# Patient Record
Sex: Male | Born: 1935 | Race: Black or African American | Hispanic: No | Marital: Married | Smoking: Former smoker
Health system: Southern US, Community
[De-identification: ages and names within clinical notes are randomized; demographics above are authoritative.]

## PROBLEM LIST (undated history)

## (undated) DIAGNOSIS — C801 Malignant (primary) neoplasm, unspecified: Secondary | ICD-10-CM

## (undated) DIAGNOSIS — M8580 Other specified disorders of bone density and structure, unspecified site: Secondary | ICD-10-CM

## (undated) DIAGNOSIS — E785 Hyperlipidemia, unspecified: Secondary | ICD-10-CM

## (undated) DIAGNOSIS — I2510 Atherosclerotic heart disease of native coronary artery without angina pectoris: Secondary | ICD-10-CM

## (undated) DIAGNOSIS — N200 Calculus of kidney: Secondary | ICD-10-CM

## (undated) DIAGNOSIS — Z923 Personal history of irradiation: Secondary | ICD-10-CM

## (undated) DIAGNOSIS — IMO0002 Reserved for concepts with insufficient information to code with codable children: Secondary | ICD-10-CM

## (undated) DIAGNOSIS — I639 Cerebral infarction, unspecified: Secondary | ICD-10-CM

## (undated) DIAGNOSIS — K5790 Diverticulosis of intestine, part unspecified, without perforation or abscess without bleeding: Secondary | ICD-10-CM

## (undated) DIAGNOSIS — R4702 Dysphasia: Secondary | ICD-10-CM

## (undated) DIAGNOSIS — N400 Enlarged prostate without lower urinary tract symptoms: Secondary | ICD-10-CM

## (undated) DIAGNOSIS — I82409 Acute embolism and thrombosis of unspecified deep veins of unspecified lower extremity: Secondary | ICD-10-CM

## (undated) DIAGNOSIS — I10 Essential (primary) hypertension: Secondary | ICD-10-CM

## (undated) DIAGNOSIS — Z9221 Personal history of antineoplastic chemotherapy: Secondary | ICD-10-CM

## (undated) DIAGNOSIS — K219 Gastro-esophageal reflux disease without esophagitis: Secondary | ICD-10-CM

## (undated) HISTORY — DX: Personal history of irradiation: Z92.3

## (undated) HISTORY — DX: Cerebral infarction, unspecified: I63.9

## (undated) HISTORY — PX: NO PAST SURGERIES: SHX2092

## (undated) HISTORY — DX: Other specified disorders of bone density and structure, unspecified site: M85.80

## (undated) HISTORY — DX: Reserved for concepts with insufficient information to code with codable children: IMO0002

## (undated) HISTORY — DX: Benign prostatic hyperplasia without lower urinary tract symptoms: N40.0

## (undated) HISTORY — DX: Essential (primary) hypertension: I10

## (undated) HISTORY — DX: Hyperlipidemia, unspecified: E78.5

## (undated) HISTORY — DX: Gastro-esophageal reflux disease without esophagitis: K21.9

## (undated) HISTORY — DX: Atherosclerotic heart disease of native coronary artery without angina pectoris: I25.10

## (undated) HISTORY — DX: Personal history of antineoplastic chemotherapy: Z92.21

---

## 1998-04-21 ENCOUNTER — Emergency Department (HOSPITAL_COMMUNITY): Admission: EM | Admit: 1998-04-21 | Discharge: 1998-04-21 | Payer: Self-pay | Admitting: Emergency Medicine

## 1999-03-11 ENCOUNTER — Ambulatory Visit (HOSPITAL_COMMUNITY): Admission: RE | Admit: 1999-03-11 | Discharge: 1999-03-11 | Payer: Self-pay | Admitting: Internal Medicine

## 1999-03-11 ENCOUNTER — Encounter: Payer: Self-pay | Admitting: Internal Medicine

## 1999-05-10 ENCOUNTER — Inpatient Hospital Stay (HOSPITAL_COMMUNITY): Admission: EM | Admit: 1999-05-10 | Discharge: 1999-05-15 | Payer: Self-pay | Admitting: Internal Medicine

## 1999-05-12 ENCOUNTER — Encounter: Payer: Self-pay | Admitting: Gastroenterology

## 1999-05-13 ENCOUNTER — Encounter: Payer: Self-pay | Admitting: Internal Medicine

## 2000-12-05 ENCOUNTER — Encounter: Payer: Self-pay | Admitting: Emergency Medicine

## 2000-12-05 ENCOUNTER — Emergency Department (HOSPITAL_COMMUNITY): Admission: EM | Admit: 2000-12-05 | Discharge: 2000-12-05 | Payer: Self-pay | Admitting: Emergency Medicine

## 2001-05-03 ENCOUNTER — Other Ambulatory Visit: Admission: RE | Admit: 2001-05-03 | Discharge: 2001-05-03 | Payer: Self-pay | Admitting: *Deleted

## 2002-05-22 ENCOUNTER — Inpatient Hospital Stay (HOSPITAL_COMMUNITY): Admission: EM | Admit: 2002-05-22 | Discharge: 2002-05-22 | Payer: Self-pay | Admitting: *Deleted

## 2003-02-06 ENCOUNTER — Encounter: Admission: RE | Admit: 2003-02-06 | Discharge: 2003-02-06 | Payer: Self-pay | Admitting: Internal Medicine

## 2003-02-06 ENCOUNTER — Encounter: Payer: Self-pay | Admitting: Internal Medicine

## 2003-04-01 ENCOUNTER — Encounter: Payer: Self-pay | Admitting: Internal Medicine

## 2003-04-01 ENCOUNTER — Encounter: Admission: RE | Admit: 2003-04-01 | Discharge: 2003-04-01 | Payer: Self-pay | Admitting: Internal Medicine

## 2003-04-02 ENCOUNTER — Encounter: Payer: Self-pay | Admitting: Internal Medicine

## 2003-04-02 ENCOUNTER — Encounter: Admission: RE | Admit: 2003-04-02 | Discharge: 2003-04-02 | Payer: Self-pay | Admitting: Internal Medicine

## 2003-06-04 ENCOUNTER — Encounter: Payer: Self-pay | Admitting: Internal Medicine

## 2003-06-04 ENCOUNTER — Encounter: Admission: RE | Admit: 2003-06-04 | Discharge: 2003-09-02 | Payer: Self-pay | Admitting: Internal Medicine

## 2003-06-04 ENCOUNTER — Encounter: Admission: RE | Admit: 2003-06-04 | Discharge: 2003-06-04 | Payer: Self-pay | Admitting: Internal Medicine

## 2003-11-11 ENCOUNTER — Emergency Department (HOSPITAL_COMMUNITY): Admission: EM | Admit: 2003-11-11 | Discharge: 2003-11-11 | Payer: Self-pay | Admitting: Emergency Medicine

## 2003-12-20 DIAGNOSIS — M8580 Other specified disorders of bone density and structure, unspecified site: Secondary | ICD-10-CM

## 2003-12-20 DIAGNOSIS — G459 Transient cerebral ischemic attack, unspecified: Secondary | ICD-10-CM

## 2003-12-20 DIAGNOSIS — I639 Cerebral infarction, unspecified: Secondary | ICD-10-CM

## 2003-12-20 DIAGNOSIS — I2510 Atherosclerotic heart disease of native coronary artery without angina pectoris: Secondary | ICD-10-CM

## 2003-12-20 HISTORY — DX: Atherosclerotic heart disease of native coronary artery without angina pectoris: I25.10

## 2003-12-20 HISTORY — DX: Cerebral infarction, unspecified: I63.9

## 2003-12-20 HISTORY — DX: Transient cerebral ischemic attack, unspecified: G45.9

## 2003-12-20 HISTORY — DX: Other specified disorders of bone density and structure, unspecified site: M85.80

## 2004-03-08 ENCOUNTER — Emergency Department (HOSPITAL_COMMUNITY): Admission: EM | Admit: 2004-03-08 | Discharge: 2004-03-08 | Payer: Self-pay | Admitting: Emergency Medicine

## 2005-01-15 ENCOUNTER — Emergency Department (HOSPITAL_COMMUNITY): Admission: EM | Admit: 2005-01-15 | Discharge: 2005-01-15 | Payer: Self-pay | Admitting: Family Medicine

## 2005-01-15 ENCOUNTER — Ambulatory Visit (HOSPITAL_COMMUNITY): Admission: RE | Admit: 2005-01-15 | Discharge: 2005-01-15 | Payer: Self-pay | Admitting: Family Medicine

## 2005-01-19 ENCOUNTER — Observation Stay (HOSPITAL_COMMUNITY): Admission: EM | Admit: 2005-01-19 | Discharge: 2005-01-22 | Payer: Self-pay | Admitting: Emergency Medicine

## 2005-02-17 ENCOUNTER — Ambulatory Visit: Payer: Self-pay | Admitting: Cardiology

## 2005-03-07 ENCOUNTER — Encounter (HOSPITAL_COMMUNITY): Admission: RE | Admit: 2005-03-07 | Discharge: 2005-06-05 | Payer: Self-pay | Admitting: Cardiovascular Disease

## 2005-04-21 ENCOUNTER — Ambulatory Visit: Payer: Self-pay | Admitting: Internal Medicine

## 2005-04-25 ENCOUNTER — Ambulatory Visit (HOSPITAL_COMMUNITY): Admission: RE | Admit: 2005-04-25 | Discharge: 2005-04-25 | Payer: Self-pay | Admitting: General Surgery

## 2005-06-06 ENCOUNTER — Encounter (HOSPITAL_COMMUNITY): Admission: RE | Admit: 2005-06-06 | Discharge: 2005-09-04 | Payer: Self-pay | Admitting: Cardiovascular Disease

## 2005-07-07 ENCOUNTER — Ambulatory Visit: Payer: Self-pay | Admitting: Cardiology

## 2005-10-05 ENCOUNTER — Ambulatory Visit: Payer: Self-pay | Admitting: Cardiology

## 2006-01-03 ENCOUNTER — Ambulatory Visit: Payer: Self-pay | Admitting: *Deleted

## 2006-01-19 ENCOUNTER — Encounter: Admission: RE | Admit: 2006-01-19 | Discharge: 2006-01-19 | Payer: Self-pay | Admitting: Internal Medicine

## 2006-04-12 ENCOUNTER — Ambulatory Visit: Payer: Self-pay | Admitting: *Deleted

## 2007-03-12 ENCOUNTER — Emergency Department (HOSPITAL_COMMUNITY): Admission: EM | Admit: 2007-03-12 | Discharge: 2007-03-12 | Payer: Self-pay | Admitting: Family Medicine

## 2008-12-20 ENCOUNTER — Emergency Department (HOSPITAL_COMMUNITY): Admission: EM | Admit: 2008-12-20 | Discharge: 2008-12-20 | Payer: Self-pay | Admitting: Emergency Medicine

## 2010-11-08 ENCOUNTER — Encounter: Payer: Self-pay | Admitting: Internal Medicine

## 2010-11-12 ENCOUNTER — Encounter
Admission: RE | Admit: 2010-11-12 | Discharge: 2010-11-12 | Payer: Self-pay | Source: Home / Self Care | Admitting: Internal Medicine

## 2010-11-12 DIAGNOSIS — C801 Malignant (primary) neoplasm, unspecified: Secondary | ICD-10-CM

## 2010-11-12 HISTORY — DX: Malignant (primary) neoplasm, unspecified: C80.1

## 2010-11-17 ENCOUNTER — Encounter: Payer: Self-pay | Admitting: Internal Medicine

## 2010-12-08 ENCOUNTER — Ambulatory Visit: Payer: Self-pay | Admitting: Internal Medicine

## 2010-12-08 DIAGNOSIS — R0789 Other chest pain: Secondary | ICD-10-CM | POA: Insufficient documentation

## 2010-12-08 DIAGNOSIS — J90 Pleural effusion, not elsewhere classified: Secondary | ICD-10-CM | POA: Insufficient documentation

## 2010-12-08 DIAGNOSIS — J984 Other disorders of lung: Secondary | ICD-10-CM

## 2010-12-14 ENCOUNTER — Encounter: Payer: Self-pay | Admitting: Internal Medicine

## 2010-12-14 ENCOUNTER — Ambulatory Visit
Admission: RE | Admit: 2010-12-14 | Discharge: 2010-12-14 | Payer: Self-pay | Source: Home / Self Care | Attending: Internal Medicine | Admitting: Internal Medicine

## 2010-12-14 ENCOUNTER — Ambulatory Visit (HOSPITAL_COMMUNITY)
Admission: RE | Admit: 2010-12-14 | Discharge: 2010-12-14 | Payer: Self-pay | Source: Home / Self Care | Attending: Internal Medicine | Admitting: Internal Medicine

## 2010-12-29 ENCOUNTER — Telehealth: Payer: Self-pay | Admitting: Internal Medicine

## 2011-01-03 ENCOUNTER — Telehealth (INDEPENDENT_AMBULATORY_CARE_PROVIDER_SITE_OTHER): Payer: Self-pay | Admitting: *Deleted

## 2011-01-04 ENCOUNTER — Ambulatory Visit (HOSPITAL_COMMUNITY)
Admission: RE | Admit: 2011-01-04 | Discharge: 2011-01-04 | Payer: Self-pay | Source: Home / Self Care | Attending: Internal Medicine | Admitting: Internal Medicine

## 2011-01-05 LAB — AMYLASE: Amylase: 69 U/L (ref 0–105)

## 2011-01-05 LAB — CBC
HCT: 41.5 % (ref 39.0–52.0)
Hemoglobin: 13.2 g/dL (ref 13.0–17.0)
MCH: 28.8 pg (ref 26.0–34.0)
MCHC: 31.8 g/dL (ref 30.0–36.0)
MCV: 90.6 fL (ref 78.0–100.0)
Platelets: 273 10*3/uL (ref 150–400)
RBC: 4.58 MIL/uL (ref 4.22–5.81)
RDW: 14.1 % (ref 11.5–15.5)
WBC: 7.1 10*3/uL (ref 4.0–10.5)

## 2011-01-05 LAB — LACTATE DEHYDROGENASE, PLEURAL OR PERITONEAL FLUID: LD, Fluid: 187 U/L — ABNORMAL HIGH (ref 3–23)

## 2011-01-05 LAB — DIFFERENTIAL
Basophils Absolute: 0 10*3/uL (ref 0.0–0.1)
Basophils Relative: 1 % (ref 0–1)
Eosinophils Absolute: 0.2 10*3/uL (ref 0.0–0.7)
Eosinophils Relative: 3 % (ref 0–5)
Lymphocytes Relative: 16 % (ref 12–46)
Lymphs Abs: 1.2 10*3/uL (ref 0.7–4.0)
Monocytes Absolute: 0.7 10*3/uL (ref 0.1–1.0)
Monocytes Relative: 10 % (ref 3–12)
Neutro Abs: 5 10*3/uL (ref 1.7–7.7)
Neutrophils Relative %: 71 % (ref 43–77)

## 2011-01-05 LAB — ALBUMIN, FLUID (OTHER): Albumin, Fluid: 2.6 g/dL

## 2011-01-05 LAB — PROTEIN, TOTAL: Total Protein: 6.6 g/dL (ref 6.0–8.3)

## 2011-01-05 LAB — ALBUMIN: Albumin: 3.4 g/dL — ABNORMAL LOW (ref 3.5–5.2)

## 2011-01-05 LAB — BODY FLUID CELL COUNT WITH DIFFERENTIAL
Lymphs, Fluid: 70 %
Monocyte-Macrophage-Serous Fluid: 10 % — ABNORMAL LOW (ref 50–90)
Neutrophil Count, Fluid: 20 % (ref 0–25)
Total Nucleated Cell Count, Fluid: 10140 cu mm — ABNORMAL HIGH (ref 0–1000)

## 2011-01-05 LAB — PATHOLOGIST SMEAR REVIEW

## 2011-01-05 LAB — GLUCOSE, RANDOM: Glucose, Bld: 250 mg/dL — ABNORMAL HIGH (ref 70–99)

## 2011-01-05 LAB — AMYLASE, BODY FLUID: Amylase, Fluid: 43 U/L

## 2011-01-05 LAB — LACTATE DEHYDROGENASE: LDH: 134 U/L (ref 94–250)

## 2011-01-10 ENCOUNTER — Telehealth: Payer: Self-pay | Admitting: Internal Medicine

## 2011-01-11 LAB — BODY FLUID CULTURE: Culture: NO GROWTH

## 2011-01-12 ENCOUNTER — Ambulatory Visit
Admission: RE | Admit: 2011-01-12 | Discharge: 2011-01-12 | Payer: Self-pay | Source: Home / Self Care | Attending: Internal Medicine | Admitting: Internal Medicine

## 2011-01-12 DIAGNOSIS — C3490 Malignant neoplasm of unspecified part of unspecified bronchus or lung: Secondary | ICD-10-CM | POA: Insufficient documentation

## 2011-01-14 ENCOUNTER — Telehealth: Payer: Self-pay | Admitting: Internal Medicine

## 2011-01-14 ENCOUNTER — Ambulatory Visit: Payer: Self-pay | Admitting: Internal Medicine

## 2011-01-19 ENCOUNTER — Telehealth (INDEPENDENT_AMBULATORY_CARE_PROVIDER_SITE_OTHER): Payer: Self-pay | Admitting: *Deleted

## 2011-01-20 NOTE — Letter (Signed)
Summary: [facility]  [facility]   Imported By: Sherian Rein 12/15/2010 15:43:57  _____________________________________________________________________  External Attachment:    Type:   Image     Comment:   External Document

## 2011-01-20 NOTE — Progress Notes (Signed)
Summary: needs fu  Phone Note Outgoing Call   Summary of Call: Caren Griffins know if you got my other flag. Please give him fu ASAP to discuss results of thoracentesis. IT is cancer. I would like to give him diagnosis face to face. Please have him bring family member or friend for discussion if he feels the need.  Initial call taken by: Kalman Shan MD,  January 10, 2011 12:57 AM     Appended Document: needs fu pt set to see MR on 01-12-11 at 1:30.

## 2011-01-20 NOTE — Progress Notes (Signed)
Summary: patient returning a call  Phone Note Call from Patient   Caller: Patient Call For: dr Marchelle Gearing Summary of Call: Patient phoned stated that his wife called him and told him that a nurse called hime this morning but that she did not leave a message. Patient can be reached at [phone] Initial call taken by: Vedia Coffer,  January 14, 2011 9:35 AM  Follow-up for Phone Call        Pt aware that referral was sent to [facility] and that he needs f/u in 2-3- months. reminder placed. Carron Curie CMA  January 14, 2011 3:40 PM

## 2011-01-20 NOTE — Progress Notes (Signed)
Summary: returning a phone call  Phone Note Call from Patient   Caller: Patient Call For: Dr. Marchelle Gearing Summary of Call: Patient phoned stated that his wife just phoned and informed him that our office phoned her and told her that he had an appointment tomorrow for a procedure to get fluid off of his lungs. patient did not know who called him but would like a call back. he can be reached at [phone] or [phone] Initial call taken by: Vedia Coffer,  January 03, 2011 11:58 AM  Follow-up for Phone Call        Spoke with pt.  He wanted to make sure that it was correct that thoracentesis tommorrow.  I advised that yes, he is sched at [facility] for tommorrow 01/04/11 at 2 pm.  Pt verbalized understanding. Follow-up by: Vernie Murders,  January 03, 2011 2:22 PM

## 2011-01-20 NOTE — Progress Notes (Addendum)
Summary: test results-  Phone Note Call from Patient   Caller: Patient Call For: Dr. Marchelle Gearing Summary of Call: patient phoned he had some test at [facility] and he would like to know if we have the results. Patient can be reached at [phone]  Initial call taken by: Vedia Coffer,  December 29, 2010 2:30 PM  Follow-up for Phone Call        Spoke with patient-he had CT and PET scan on 12-14-10 and would like to know the results today asap. They are signed off on in EMR but results noted. Please advise.Reynaldo Minium CMA  December 29, 2010 2:47 PM   Additional Follow-up for Phone Call Additional follow up Details #1::        please tell him I will call him tomorrow with results. Just got back from vacation and catching up. Please apologize Additional Follow-up by: Kalman Shan MD,  December 29, 2010 5:32 PM    Additional Follow-up for Phone Call Additional follow up Details #2::    called spoke with patient, advised of MR's response.  pt okay with this and verbalized his understanding.  will forward to MR as a reminder. Boone Master CNA/MA  December 29, 2010 5:34 PM   Pt called again today re: same and will wait for callback from MR. Tivis Ringer, CNA  December 30, 2010 2:09 PM  Daughter lomalee called again, wants test results.Darletta Moll  December 31, 2010 11:02 AM   Additional Follow-up for Phone Call Additional follow up Details #3:: Details for Additional Follow-up Action Taken: Spoke to patient  a) LUL nodule - PET low grade uptake. Wil d/w [facility] on thrusday next week to decide fu v biopsy b) Left pleural effusion - still with pain. Will set up US guided thoracentesis  HJe will wait to hear more from [place] Additional Follow-up by: Kalman Shan MD,  December 31, 2010 6:46 PM  [place] scheduled and Almyra Free spoke to pt wife. Carron Curie CMA  January 03, 2011 3:34 PM  Appended Document: test results- call from IR. They say that fluid is moderate in size and they can draw  mpore than whatis neeed for diagnostic. I approved to remove as much as fluid as possible. Will await results and decide fu  Appended Document: test results- pleural fluid shows malignant cells. Pls call him and give him appt 01/12/2011 tuesday to discuss. He does NOT know diagnosis. Please do not give over phone  Appended Document: test results- pt set to see MR on 01-02-11 at 1:30pm.

## 2011-01-20 NOTE — Letter (Signed)
Summary: Referral,problem & medication list/[facility]  Referral,problem & medication list/[facility]   Imported By: Sherian Rein 12/15/2010 15:46:24  _____________________________________________________________________  External Attachment:    Type:   Image     Comment:   External Document

## 2011-01-20 NOTE — Assessment & Plan Note (Signed)
Summary: LUNG NODULES///KP   Visit Type:  Initial Consult Copy to:  Dr. Fleet Contras Primary Provider/Referring Provider:  Dr. Fleet Contras  CC:  Pulmonary Consult for pain in left side of chest under left arm. Pt states pain is worse when he lays on left side. 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 He is a ex-96 pack smoker.  He c/o left infrascapular pain that is of insisious onset starting 3 months ago per outside records. it is constant, made worse by bending to left or lying down on left side, and imprved by lying down on right and partially by Alleve. Compressing in nature. Moderately to Severe in intensity. Occassionally radiates to left infraaxillary area. Denies assoicated fever, weight loss, hemoptysis, local trauma, angina, cough, wheezing. However, some 3 months ago he recollects pullng a heavy gate on a daily basis with left arm. He was doing this for atleast 1 month and stopped when current pain started. He denies any recent pneumonia, chills, fever.    Preventive Screening-Counseling & Management  Alcohol-Tobacco     Alcohol drinks/day: 0     Smoking Status: quit     Packs/Day: 3.0     Year Started: 1946     Year Quit: 1980     Pack years: 77  Current Medications (verified): 1)  Meclizine Hcl 25 Mg Tabs (Meclizine Hcl) .Marland Kitchen.. 1 Tablet Every 8 Hours As Needed 2)  Glucovance 5-500 Mg Tabs (Glyburide-Metformin) .... Take 1 Tablet By Mouth Two Times A Day \\par  3)  Crestor 40 Mg Tabs (Rosuvastatin Calcium) .... Take 1 Tablet By Mouth Once A Day 4)  Lotrel 5-10 Mg Caps (Amlodipine Besy-Benazepril Hcl) .... Take 1 Tablet By Mouth Once A Day 5)  Nexium 40 Mg Cpdr (Esomeprazole Magnesium) .... Take 1 Tablet By Mouth Once A Day 6)  Aspirin 81 Mg Tbec (Aspirin) .... Take 1 Tablet By Mouth Once A Day 7)  Flonase 50 Mcg/act Susp (Fluticasone Propionate) .... 2 Sprays Each Nostril Daily 8)  Allegra Allergy 180 Mg Tabs  (Fexofenadine Hcl) .... Take 1 Tablet By Mouth Once A Day As Needed 9)  Flomax 0.4 Mg Caps (Tamsulosin Hcl) .... Take 1 Tablet By Mouth Once A Day 10)  Centrum Silver  Tabs (Multiple Vitamins-Minerals) .... Take 1 Tablet By Mouth Once A Day  Allergies (verified): 1)  ! Pcn  Past History:  Past Medical History: Gout Deg. of lumbar intervertebral Disc BPH Diabetes Hypertension Hyperlipidemia GERD Osteopenia CAD ........Marland KitchenDr Erlene Quan  - NSTEMI 2006, 100% RCA. 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  Started in 1948, quit in 1980 3 ppd.  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  The patient denies shortness of breath with activity, shortness of breath at rest, productive cough, non-productive cough, coughing up blood, irregular heartbeats, acid heartburn, indigestion, loss of appetite, weight change, abdominal pain, difficulty swallowing, sore throat, tooth/dental problems, headaches, nasal congestion/difficulty breathing through nose, sneezing, itching, ear ache, anxiety, depression, hand/feet swelling, joint stiffness or pain, rash, change in color of mucus, and fever.    Vital Signs:  Patient profile:   77 year old male  Height:      65 inches Weight:      196.25 pounds BMI:     32.78 O2 Sat:      95 % on Room air Temp:     97.5 degrees F oral Pulse rate:   100 / minute BP sitting:   160 / 98  (right arm) Cuff size:   regular  Vitals Entered By: Carron Curie CMA (December 08, 2010 9:32 AM)  O2 Flow:  Room air CC: Pulmonary Consult for pain in left side of chest under left arm. Pt states pain is worse when he lays on left side.  Comments Medications reviewed with  patient Carron Curie CMA  December 08, 2010 9:35 AM Daytime phone number verified with patient.    Physical Exam  General:  well developed, well nourished, in no acute distressobese.   Head:  normocephalic and atraumatic Eyes:  PERRLA/EOM intact; conjunctiva and sclera clear Ears:  TMs intact and clear with normal canals Nose:  no deformity, discharge, inflammation, or lesions Mouth:  no deformity or lesions Neck:  no masses, thyromegaly, or abnormal cervical nodes Chest Wall:  no deformities noted Lungs:  decreased BS on L and dullness L base.  rest normal. some pain on deep palpation Heart:  regular rate and rhythm, S1, S2 without murmurs, rubs, gallops, or clicks Abdomen:  bowel sounds positive; abdomen soft and non-tender without masses, or organomegaly Msk:  no deformity or scoliosis noted with normal posture Pulses:  pulses normal Extremities:  no clubbing, cyanosis, edema, or deformity noted Neurologic:  CN II-XII grossly intact with normal reflexes, coordination, muscle strength and tone Skin:  intact without lesions or rashes Cervical Nodes:  no significant adenopathy Axillary Nodes:  no significant adenopathy Psych:  alert and cooperative; normal mood and affect; normal attention span and concentration   CT of Chest  Procedure date:  11/12/2010  Findings:      Small left loculated effusion LUL arnterior segment 1.7 cm atypical nodule on image 23 bullous emphysema esp at apices  Comments:      all new since prior ct chest imaging in 2006.   I independently reviewed films  Impression & Recommendations:  Problem # 1:  CHEST PAIN, ATYPICAL (ICD-786.59) Assessment New This is due to the loculated left effusion. Only partial relief with alleve.   plan for pain contrl I have upgraded him to percocet generic as needed need to finish workup first likely might needs VATS exam with thoracic surgery Orders: Pulmonary Referral (Pulmonary) Consultation Level V  (04540) Prescription Created Electronically [phone])  Problem # 2:  PLEURAL EFFUSION, LEFT (ICD-511.9) Assessment: New Unclear why he has loculated effusion. No prior pna history. One possibility is trauma and smal hemothorax from pullnig a heavy gate.Another possibiluty is malignancy. DDx also inclues empyema with pna that was astymptomatic preceding it.  PLAN repeat ct Orders: Radiology Referral (Radiology) Pulmonary Referral (Pulmonary) Consultation Level V [phone])  Problem # 3:  PULMONARY NODULE, LEFT UPPER LOBE (ICD-518.89) Assessment: New Unclear if nodule is incidental finding or related to left loculated effusion. Regardlesss it is new since 2006, and with age and smoking history and size of 1.7cm need to proceeed with lung cancer workup. Will get PET scan with SUPER D PROTOCOL. IF nodule is amenable to ENB bronch, he will need that.  Orders: Radiology Referral (Radiology) Pulmonary Referral (Pulmonary) Consultation Level V [phone])  Medications Added to Medication List This Visit: 1)  Centrum Silver Tabs (Multiple vitamins-minerals) .... Take 1 tablet  by mouth once a day 2)  Oxycodone-acetaminophen 5-500 Mg Caps (Oxycodone-acetaminophen) .... Take 1 tablet every 6 hours as needed  Patient Instructions: 1)  please have PET scan 2)  please have breathing test called PFT 3)  for pain take percocet as directed 4)  return after completion of tests in early part of Jan 2012 Prescriptions: OXYCODONE-ACETAMINOPHEN 5-500 MG CAPS (OXYCODONE-ACETAMINOPHEN) take 1 tablet every 6 hours as needed  #120 x 0   Entered and Authorized by:   Kalman Shan MD   Signed by:   Kalman Shan MD on 12/08/2010   Method used:   Print then Give to Patient   RxID:   [id number]    Immunization History:  Influenza Immunization History:    Influenza:  historical (10/19/2010)

## 2011-01-20 NOTE — Assessment & Plan Note (Signed)
Summary: discuss results//jrc   Visit Type:  Follow-up Copy to:  Dr. Fleet Contras Primary Provider/Referring Provider:  Dr. Fleet Contras  CC:  Pt here to discuss results. .  History of Present Illness: December 08, 2010: 77 year old Johnathan Bowman referred by Dr. Claudie Revering. He is a ex-96 pack smoker.  He c/o left infrascapular pain that is of insisious onset starting 3 months ago per outside records. it is constant, made worse by bending to left or lying down on left side, and imprved by lying down on right and partially by Alleve. Compressing in nature. Moderately to Severe in intensity. Occassionally radiates to left infraaxillary area. Denies assoicated fever, weight loss, hemoptysis, local trauma, angina, cough, wheezing. However, some 3 months ago he recollects pullng a heavy gate on a daily basis with left arm. He was doing this for atleast 1 month and stopped when current pain started. He denies any recent pneumonia, chills, fever. REC PET and CT   January 12, 2011: PET and CT showed low uptake pulmonary nodule but persistent left pleural effusion. Underwent left side thoracenteis 01/04/2011 that  is POSITIVE FOR METASTATIC ADENOCARCINOMA suspicious for lung cancer source. No interim complaints. Less dyspneic but stil with rib cage pain on left side. No edema. No weight loss.     Preventive Screening-Counseling & Management  Alcohol-Tobacco     Alcohol drinks/day: 0     Smoking Status: quit     Packs/Day: 3.0     Year Started: 1946     Year Quit: 1980     Pack years: 55  Current Medications (verified): 1)  Meclizine Hcl 25 Mg Tabs (Meclizine Hcl) .Marland Kitchen.. 1 Tablet Every 8 Hours As Needed 2)  Glucovance 5-500 Mg Tabs (Glyburide-Metformin) .... Take 1 Tablet By Mouth Two Times A Day \\par  3)  Crestor 40 Mg Tabs (Rosuvastatin Calcium) .... Take 1 Tablet By Mouth Once A Day 4)  Nexium 40 Mg Cpdr (Esomeprazole Magnesium) .... Take 1 Tablet By Mouth Once A Day 5)  Aspirin 81 Mg Tbec  (Aspirin) .... Take 1 Tablet By Mouth Once A Day 6)  Flonase 50 Mcg/act Susp (Fluticasone Propionate) .... 2 Sprays Each Nostril Daily 7)  Allegra Allergy 180 Mg Tabs (Fexofenadine Hcl) .... Take 1 Tablet By Mouth Once A Day As Needed 8)  Flomax 0.4 Mg Caps (Tamsulosin Hcl) .... Take 1 Tablet By Mouth Once A Day 9)  Centrum Silver  Tabs (Multiple Vitamins-Minerals) .... Take 1 Tablet By Mouth Once A Day 10)  Oxycodone-Acetaminophen 5-500 Mg Caps (Oxycodone-Acetaminophen) .... As Directed  Allergies (verified): 1)  ! Pcn  Past History:  Past medical, surgical, family and social histories (including risk factors) reviewed, and no changes noted (except as noted below).  Past Medical History: Reviewed history from 12/08/2010 and no changes required. Gout Deg. of lumbar intervertebral Disc BPH Diabetes Hypertension Hyperlipidemia GERD Osteopenia CAD ........Marland KitchenDr Erlene Quan  - NSTEMI 2006, 100% RCA. Medically Rx CVA Allergic Rhinitis  Family History: Reviewed history from 12/07/2010 and no changes required. Father-died MVA Mother-died unknown Siblings-none  Social History: Reviewed history from 12/08/2010 and no changes required. One son and one daughter, living and well  - son in [place]. 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  Started in 1948, quit in 1980 3 ppd.  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  The patient denies non-productive cough, coughing up blood, irregular heartbeats, acid heartburn, indigestion, loss of appetite, weight change, abdominal pain, difficulty swallowing, sore throat, tooth/dental problems, headaches, nasal congestion/difficulty breathing through nose, sneezing, itching, ear ache,  anxiety, depression, hand/feet swelling, joint stiffness or pain, rash, change in color of mucus, and fever.    Vital Signs:  Patient profile:   77 year old male Height:      65 inches Weight:      195.38 pounds BMI:     32.63 O2 Sat:      91 % on Room air Temp:     97.0 degrees F oral Pulse rate:   98 / minute BP sitting:   130 / 92  (right arm) Cuff size:   regular  Vitals Entered By: Carron Curie CMA (January 12, 2011 1:33 PM)  O2 Flow:  Room air CC: Pt here to discuss results.  Comments Medications reviewed with patient Carron Curie CMA  January 12, 2011 1:36 PM Daytime phone number verified with patient.    Physical Exam  General:  well developed, well nourished, in no acute distressobese.   Head:  normocephalic and atraumatic Eyes:  PERRLA/EOM intact; conjunctiva and sclera clear Ears:  TMs intact and clear with normal canals Nose:  no deformity, discharge, inflammation, or lesions Mouth:  no deformity or lesions Neck:  no masses, thyromegaly, or abnormal cervical nodes Chest Wall:  no deformities noted Lungs:  decreased BS on L and dullness L base.  rest normal. some pain on deep palpation Heart:  regular rate and rhythm, S1, S2 without murmurs, rubs, gallops, or clicks Abdomen:  bowel sounds positive; abdomen soft and non-tender without masses, or organomegaly Msk:  no deformity or scoliosis noted with normal posture Pulses:  pulses normal Extremities:  no clubbing, cyanosis, edema, or deformity noted Neurologic:  CN II-XII grossly intact with normal reflexes, coordination, muscle strength and tone Skin:  intact without lesions or rashes Cervical Nodes:  no significant adenopathy Axillary Nodes:  no significant adenopathy Psych:  alert and cooperative; normal mood and affect; normal attention span and concentration   MISC. Report  Procedure date:  01/04/2011  Findings:      nderwent thoracenteis 01/04/2011 that  is POSITIVE FOR METASTATIC  ADENOCARCINOMA  Impression & Recommendations:  Problem # 1:  ADENOCARCIMONA, LUNG, NONSMALL CELL (ICD-162.9) Assessment New likely stage 4 NSCLC. Broke news. He will inform wife. ECOG is 1  plan await EFGR and ALK stains refer DR. Mohammed will dicus at [facility] tomorrow about pleurodesis and other optiosns  Medications Added to Medication List This Visit: 1)  Oxycodone-acetaminophen 5-500 Mg Caps (Oxycodone-acetaminophen) .... As directed  Other Orders: Oncology Referral (Oncology) Est. Patient Level IV [phone])  Patient Instructions: 1)  I am going to discuss your case at cancer conference tomorrow 2)  AS discussed your diagnosis is lung cancer 3)  I have set up appointment with Dr. Roland Rack cancer specialist to see you 4)  Please wait to hear from me in next 2 days about my meeting tomorrow  Appended Document: discuss results//jrc let him  know to see Dr. Shirline Frees in oncology and then to see me back in 2-3 months to make sure everything is going okay and how that fluid is doing. pls give him fu  Appended Document: discuss results//jrc [id number] with family member.   Appended Document: discuss results//jrc  see phone note.

## 2011-01-24 ENCOUNTER — Telehealth: Payer: Self-pay | Admitting: Internal Medicine

## 2011-01-28 ENCOUNTER — Emergency Department (HOSPITAL_COMMUNITY): Payer: Medicare Other

## 2011-01-28 ENCOUNTER — Emergency Department (HOSPITAL_COMMUNITY)
Admission: EM | Admit: 2011-01-28 | Discharge: 2011-01-28 | Disposition: A | Discharge status: Home / Self Care | Payer: Medicare Other | Attending: Emergency Medicine | Admitting: Emergency Medicine

## 2011-01-28 DIAGNOSIS — R059 Cough, unspecified: Secondary | ICD-10-CM | POA: Insufficient documentation

## 2011-01-28 DIAGNOSIS — J90 Pleural effusion, not elsewhere classified: Secondary | ICD-10-CM | POA: Insufficient documentation

## 2011-01-28 DIAGNOSIS — R079 Chest pain, unspecified: Secondary | ICD-10-CM | POA: Insufficient documentation

## 2011-01-28 DIAGNOSIS — M129 Arthropathy, unspecified: Secondary | ICD-10-CM | POA: Insufficient documentation

## 2011-01-28 DIAGNOSIS — I252 Old myocardial infarction: Secondary | ICD-10-CM | POA: Insufficient documentation

## 2011-01-28 DIAGNOSIS — C801 Malignant (primary) neoplasm, unspecified: Secondary | ICD-10-CM | POA: Insufficient documentation

## 2011-01-28 DIAGNOSIS — E785 Hyperlipidemia, unspecified: Secondary | ICD-10-CM | POA: Insufficient documentation

## 2011-01-28 DIAGNOSIS — C3490 Malignant neoplasm of unspecified part of unspecified bronchus or lung: Secondary | ICD-10-CM | POA: Insufficient documentation

## 2011-01-28 DIAGNOSIS — E119 Type 2 diabetes mellitus without complications: Secondary | ICD-10-CM | POA: Insufficient documentation

## 2011-01-28 DIAGNOSIS — Z79899 Other long term (current) drug therapy: Secondary | ICD-10-CM | POA: Insufficient documentation

## 2011-01-28 DIAGNOSIS — I2510 Atherosclerotic heart disease of native coronary artery without angina pectoris: Secondary | ICD-10-CM | POA: Insufficient documentation

## 2011-01-28 DIAGNOSIS — I509 Heart failure, unspecified: Secondary | ICD-10-CM | POA: Insufficient documentation

## 2011-01-28 DIAGNOSIS — R0602 Shortness of breath: Secondary | ICD-10-CM | POA: Insufficient documentation

## 2011-01-28 DIAGNOSIS — Z85118 Personal history of other malignant neoplasm of bronchus and lung: Secondary | ICD-10-CM | POA: Insufficient documentation

## 2011-01-28 DIAGNOSIS — R0902 Hypoxemia: Secondary | ICD-10-CM | POA: Insufficient documentation

## 2011-01-28 DIAGNOSIS — R05 Cough: Secondary | ICD-10-CM | POA: Insufficient documentation

## 2011-01-28 DIAGNOSIS — R0601 Orthopnea: Secondary | ICD-10-CM | POA: Insufficient documentation

## 2011-01-28 LAB — COMPREHENSIVE METABOLIC PANEL
Albumin: 3.1 g/dL — ABNORMAL LOW (ref 3.5–5.2)
Alkaline Phosphatase: 84 U/L (ref 39–117)
CO2: 28 mEq/L (ref 19–32)
Calcium: 8.8 mg/dL (ref 8.4–10.5)
Chloride: 100 mEq/L (ref 96–112)
Creatinine, Ser: 0.81 mg/dL (ref 0.4–1.5)
Potassium: 4.6 mEq/L (ref 3.5–5.1)
Sodium: 135 mEq/L (ref 135–145)
Total Bilirubin: 0.5 mg/dL (ref 0.3–1.2)
Total Protein: 7 g/dL (ref 6.0–8.3)

## 2011-01-28 LAB — DIFFERENTIAL
Basophils Absolute: 0 10*3/uL (ref 0.0–0.1)
Basophils Relative: 0 % (ref 0–1)
Eosinophils Absolute: 0.1 10*3/uL (ref 0.0–0.7)
Eosinophils Relative: 2 % (ref 0–5)
Monocytes Absolute: 0.7 10*3/uL (ref 0.1–1.0)
Monocytes Relative: 11 % (ref 3–12)

## 2011-01-28 LAB — CBC
HCT: 41.1 % (ref 39.0–52.0)
MCH: 28.1 pg (ref 26.0–34.0)
MCV: 88.8 fL (ref 78.0–100.0)
Platelets: 394 10*3/uL (ref 150–400)
RBC: 4.63 MIL/uL (ref 4.22–5.81)

## 2011-01-28 LAB — POCT CARDIAC MARKERS: Troponin i, poc: <0.05 ng/mL (ref 0.00–0.09)

## 2011-02-01 ENCOUNTER — Other Ambulatory Visit: Payer: Self-pay | Admitting: Internal Medicine

## 2011-02-01 ENCOUNTER — Encounter (HOSPITAL_BASED_OUTPATIENT_CLINIC_OR_DEPARTMENT_OTHER): Payer: Medicare Other | Admitting: Internal Medicine

## 2011-02-01 DIAGNOSIS — C7931 Secondary malignant neoplasm of brain: Secondary | ICD-10-CM

## 2011-02-01 DIAGNOSIS — C3490 Malignant neoplasm of unspecified part of unspecified bronchus or lung: Secondary | ICD-10-CM

## 2011-02-01 LAB — COMPREHENSIVE METABOLIC PANEL
ALT: 22 U/L (ref 0–53)
AST: 16 U/L (ref 0–37)
BUN: 10 mg/dL (ref 6–23)
CO2: 27 mEq/L (ref 19–32)
Calcium: 9 mg/dL (ref 8.4–10.5)
Potassium: 4.4 mEq/L (ref 3.5–5.3)
Sodium: 137 mEq/L (ref 135–145)
Total Bilirubin: 0.2 mg/dL — ABNORMAL LOW (ref 0.3–1.2)

## 2011-02-01 LAB — CBC WITH DIFFERENTIAL/PLATELET
EOS%: 4 % (ref 0.0–7.0)
HCT: 41.8 % (ref 38.4–49.9)
HGB: 13.2 g/dL (ref 13.0–17.1)
MCH: 27.8 pg (ref 27.2–33.4)
MCV: 87.8 fL (ref 79.3–98.0)
MONO%: 6.3 % (ref 0.0–14.0)
NEUT%: 79.1 % — ABNORMAL HIGH (ref 39.0–75.0)
Platelets: 411 10*3/uL — ABNORMAL HIGH (ref 140–400)
RDW: 14.4 % (ref 11.0–14.6)
WBC: 5.5 10*3/uL (ref 4.0–10.3)

## 2011-02-02 ENCOUNTER — Encounter (INDEPENDENT_AMBULATORY_CARE_PROVIDER_SITE_OTHER): Payer: Medicare Other | Admitting: Thoracic Surgery

## 2011-02-02 DIAGNOSIS — J910 Malignant pleural effusion: Secondary | ICD-10-CM

## 2011-02-03 ENCOUNTER — Telehealth: Payer: Self-pay | Admitting: Internal Medicine

## 2011-02-03 NOTE — Progress Notes (Signed)
Summary: referral  Phone Note Call from Patient   Caller: Patient Call For: ramaswamy Summary of Call: pt is still waiting for referal from dr. Shirline Frees. call pt at cell [phone] (try that # 1st) or home # above or [phone] Initial call taken by: Tivis Ringer, CNA,  January 19, 2011 3:40 PM  Follow-up for Phone Call        Looks like referal info was sent to the [facility] by Almyra Free on 01/14/11- called and LMOVM for Renne at [facility] to make sure records were recieved.  Follow-up by: Vernie Murders,  January 19, 2011 4:46 PM  Additional Follow-up for Phone Call Additional follow up Details #1::        Spoke with [facility] and they did nto receive referral . I will foward to [facility] so they an redo referral. Carron Curie CMA  January 24, 2011 11:30 AM     Additional Follow-up for Phone Call Additional follow up Details #2::    pt has appt set up and has been notified for the appt with [facility] and records were refaxed  Follow-up by: Oneita Jolly,  January 24, 2011 12:48 PM

## 2011-02-03 NOTE — Letter (Signed)
February 02, 2011  Velora Heckler. Arbutus Ped, MD [address] [place], [place] [address]  Re:  Johnathan Bowman, Johnathan Bowman                DOB:  12-28-1928  Dear Arbutus Ped:  I appreciate the opportunity to see this 77 year old African American male who developed left pleural effusion, has had multiple thoracentesis and thoracentesis show malignant adenocarcinoma, last one being done on 2/10.  He is not that short of breath.  Right now, he is on home oxygen. CT scan showed a 1.8 x 1.4 lesion in the left upper lobe and  also has some numerous pleural based nodules.  PET scan was positive in the nodules, but only low activity.  There also was minimal uptake in the pleural effusion.  He also had extensive colonic diverticulosis and bilateral inguinal hernias as well as a small renal calculus in the right pole collecting system.  PAST MEDICAL HISTORY:  He is on glucosamine and oxygen.  Another medication which he did not bring with him today.  He is allergic to PENICILLIN.  He has diabetes and hypertension.  FAMILY HISTORY:  Noncontributory.  SOCIAL HISTORY:  Married, three children.  Smoked for 30 years.  Does not smoke now, does not drink alcohol on a regular basis.  REVIEW OF SYSTEMS:  He is 193 pounds, he is 5 feet 6 inches.  GENERAL: His weight, he has had some weight loss.  CARDIAC:  No shortness of breath.  No angina.  PULMONARY:  He is on oxygen.  No hemoptysis.  GI: No nausea, vomiting, constipation, or diarrhea.  GU:  No kidney stone or kidney disease.  VASCULAR:  No claudication, DVT, or TIAs. NEUROLOGICAL:  No dizziness, headaches, blackouts, or seizures. MUSCULOSKELETAL:  Arthritis.  PSYCHIATRIC:  Depression and nervousness. Eyes/ENT:  No changes in eyesight or hearing.  HEMATOLOGIC:  No problems with bleeding or clotting disorders.  PHYSICAL EXAMINATION:  GENERAL:  He is a well-developed [place] American male in no acute distress. VITAL SIGNS:  His blood pressure is 157/90, pulse 100,  respirations 18, and sats were 98%. HEAD, EYES, EARS, NOSE AND THROAT:  Unremarkable. NECK:  Supple without thyromegaly.  There is no supraclavicular or axillary adenopathy. CHEST:  Decreased breath sounds on the left normal with sounds on the right. HEART:  Regular sinus rhythm.  No murmurs. ABDOMEN:  Soft.  There is no hepatosplenomegaly. EXTREMITIES:  Pulses are 2+.  There is no clubbing or edema. NEUROLOGIC:  He is oriented x3.  Sensory and motor intact.  Cranial nerves intact.  PLAN:  Insert a left Pleurx Catheter for him and start him on drainage and then also put a Port-A-Cath, we plan to do this on 22 at [facility].  Ines Bloomer, M.D. Electronically Signed  DPB/MEDQ  D:  02/02/2011  T:  02/02/2011  Job:  [id number]

## 2011-02-03 NOTE — Progress Notes (Signed)
Summary: pt needs appt with Dr. Arbutus Ped  Phone Note Call from Patient   Caller: Patient Call For: Anneliese Leblond Summary of Call: pt requests to speak to MR re: consult (still waiting for this) w/ dr Gwenyth Bouillon. pt states his condition is worsening. call pt at [phone] Initial call taken by: Tivis Ringer, CNA,  January 24, 2011 11:32 AM  Follow-up for Phone Call        pt has still not heard from [facility] YN:WGNF this was sent on 1/25 by libby--  Philipp Deputy CMA  January 24, 2011 12:07 PM   see prev phone note.Olive Macnaughton has appt. Carron Curie CMA  January 25, 2011 5:42 PM

## 2011-02-04 ENCOUNTER — Other Ambulatory Visit (HOSPITAL_COMMUNITY): Payer: Medicare Other

## 2011-02-04 ENCOUNTER — Other Ambulatory Visit: Payer: Self-pay | Admitting: Internal Medicine

## 2011-02-04 ENCOUNTER — Ambulatory Visit (HOSPITAL_COMMUNITY)
Admission: RE | Admit: 2011-02-04 | Discharge: 2011-02-04 | Disposition: A | Discharge status: Home / Self Care | Payer: Medicare Other | Source: Ambulatory Visit | Attending: Internal Medicine | Admitting: Internal Medicine

## 2011-02-04 DIAGNOSIS — R51 Headache: Secondary | ICD-10-CM | POA: Insufficient documentation

## 2011-02-04 DIAGNOSIS — C3490 Malignant neoplasm of unspecified part of unspecified bronchus or lung: Secondary | ICD-10-CM | POA: Insufficient documentation

## 2011-02-04 DIAGNOSIS — R209 Unspecified disturbances of skin sensation: Secondary | ICD-10-CM | POA: Insufficient documentation

## 2011-02-04 DIAGNOSIS — C7931 Secondary malignant neoplasm of brain: Secondary | ICD-10-CM

## 2011-02-04 DIAGNOSIS — H539 Unspecified visual disturbance: Secondary | ICD-10-CM | POA: Insufficient documentation

## 2011-02-04 MED ORDER — GADOBENATE DIMEGLUMINE 529 MG/ML IV SOLN
18.0000 mL | Freq: Once | INTRAVENOUS | Status: AC | PRN
  Administered 2011-02-04: 18 mL via INTRAVENOUS

## 2011-02-07 ENCOUNTER — Encounter (HOSPITAL_COMMUNITY)
Admission: RE | Admit: 2011-02-07 | Discharge: 2011-02-07 | Disposition: A | Discharge status: Home / Self Care | Payer: Medicare Other | Source: Ambulatory Visit | Attending: Thoracic Surgery | Admitting: Thoracic Surgery

## 2011-02-07 ENCOUNTER — Other Ambulatory Visit: Payer: Self-pay | Admitting: Thoracic Surgery

## 2011-02-07 DIAGNOSIS — J910 Malignant pleural effusion: Secondary | ICD-10-CM

## 2011-02-07 DIAGNOSIS — Z01818 Encounter for other preprocedural examination: Secondary | ICD-10-CM | POA: Insufficient documentation

## 2011-02-07 DIAGNOSIS — Z01812 Encounter for preprocedural laboratory examination: Secondary | ICD-10-CM | POA: Insufficient documentation

## 2011-02-07 DIAGNOSIS — Z01810 Encounter for preprocedural cardiovascular examination: Secondary | ICD-10-CM | POA: Insufficient documentation

## 2011-02-07 LAB — COMPREHENSIVE METABOLIC PANEL
Alkaline Phosphatase: 76 U/L (ref 39–117)
Chloride: 101 mEq/L (ref 96–112)
GFR calc Af Amer: >60 mL/min (ref >60)
GFR calc non Af Amer: >60 mL/min (ref >60)
Glucose, Bld: 211 mg/dL — ABNORMAL HIGH (ref 70–99)
Sodium: 137 mEq/L (ref 135–145)
Total Bilirubin: 0.5 mg/dL (ref 0.3–1.2)

## 2011-02-07 LAB — CBC
Hemoglobin: 14.1 g/dL (ref 13.0–17.0)
MCH: 27.9 pg (ref 26.0–34.0)
Platelets: 339 10*3/uL (ref 150–400)
RDW: 14 % (ref 11.5–15.5)

## 2011-02-07 LAB — SURGICAL PCR SCREEN
MRSA, PCR: NEGATIVE
Staphylococcus aureus: NEGATIVE

## 2011-02-07 LAB — PROTIME-INR
INR: 0.9 (ref 0.00–1.49)
Prothrombin Time: 12.4 seconds (ref 11.6–15.2)

## 2011-02-07 LAB — APTT: aPTT: 29 seconds (ref 24–37)

## 2011-02-08 ENCOUNTER — Ambulatory Visit (HOSPITAL_COMMUNITY)
Admission: RE | Admit: 2011-02-08 | Discharge: 2011-02-08 | Disposition: A | Discharge status: Home / Self Care | Payer: Medicare Other | Source: Ambulatory Visit | Attending: Thoracic Surgery | Admitting: Thoracic Surgery

## 2011-02-08 ENCOUNTER — Other Ambulatory Visit: Payer: Self-pay | Admitting: Thoracic Surgery

## 2011-02-08 ENCOUNTER — Ambulatory Visit (HOSPITAL_COMMUNITY): Payer: Medicare Other

## 2011-02-08 DIAGNOSIS — Z01810 Encounter for preprocedural cardiovascular examination: Secondary | ICD-10-CM | POA: Insufficient documentation

## 2011-02-08 DIAGNOSIS — H409 Unspecified glaucoma: Secondary | ICD-10-CM | POA: Insufficient documentation

## 2011-02-08 DIAGNOSIS — Z01818 Encounter for other preprocedural examination: Secondary | ICD-10-CM | POA: Insufficient documentation

## 2011-02-08 DIAGNOSIS — J910 Malignant pleural effusion: Secondary | ICD-10-CM

## 2011-02-08 DIAGNOSIS — E119 Type 2 diabetes mellitus without complications: Secondary | ICD-10-CM | POA: Insufficient documentation

## 2011-02-08 DIAGNOSIS — Z9981 Dependence on supplemental oxygen: Secondary | ICD-10-CM | POA: Insufficient documentation

## 2011-02-08 DIAGNOSIS — Z8673 Personal history of transient ischemic attack (TIA), and cerebral infarction without residual deficits: Secondary | ICD-10-CM | POA: Insufficient documentation

## 2011-02-08 DIAGNOSIS — I2510 Atherosclerotic heart disease of native coronary artery without angina pectoris: Secondary | ICD-10-CM | POA: Insufficient documentation

## 2011-02-08 DIAGNOSIS — Z01812 Encounter for preprocedural laboratory examination: Secondary | ICD-10-CM | POA: Insufficient documentation

## 2011-02-08 DIAGNOSIS — I252 Old myocardial infarction: Secondary | ICD-10-CM | POA: Insufficient documentation

## 2011-02-08 DIAGNOSIS — Z87891 Personal history of nicotine dependence: Secondary | ICD-10-CM | POA: Insufficient documentation

## 2011-02-08 DIAGNOSIS — I10 Essential (primary) hypertension: Secondary | ICD-10-CM | POA: Insufficient documentation

## 2011-02-08 HISTORY — PX: OTHER SURGICAL HISTORY: SHX169

## 2011-02-08 LAB — GLUCOSE, CAPILLARY
Glucose-Capillary: 119 mg/dL — ABNORMAL HIGH (ref 70–99)
Glucose-Capillary: 103 mg/dL — ABNORMAL HIGH (ref 70–99)

## 2011-02-09 NOTE — Op Note (Signed)
Johnathan Bowman, Johnathan Bowman                ACCOUNT NO.:  [id number]  MEDICAL RECORD NO.:  [id number]           PATIENT TYPE:  O  LOCATION:  XRAY                         FACILITY:  [facility]  PHYSICIAN:  Ines Bloomer, M.D. DATE OF BIRTH:  10-21-1929  DATE OF PROCEDURE:  02/07/2011 DATE OF DISCHARGE:  02/07/2011                              OPERATIVE REPORT   PREOPERATIVE DIAGNOSIS:  Left malignant pleural effusion secondary to the non-small cell lung cancer.  POSTOPERATIVE DIAGNOSIS:  Left malignant pleural effusion secondary to the non-small cell lung cancer.  OPERATION PERFORMED:  Insertion of left Pleurx and right IJ Port-A-Cath under local anesthesia.  SURGEON:  Ines Bloomer, MD  ANESTHESIA:  Xylocaine 1% with IV sedation.  The patient was turned to the left lateral thoracotomy position, was prepped and draped in usual sterile manner.  Two areas were infiltrated with 1% Xylocaine at the seventh intercostal space, the anterior axillary line and the posterior axillary line.  At the posterior axillary line, we inserted an Angiocath and through that a guidewire into the right pleura.  Angiocath was removed.  A stab wound was made around the guidewire.  Another stab wound was made at the anterior axillary line, and a Pleurx catheter was tunneled from the anterior to the posterior stab wounds.  Over the guidewire, was passed dilators, and the last dilator will be having a peel-away sheath.  The dilator and the guidewire were removed and the Pleurx catheter passed through the peel- away sheath.  Peel-away sheath was removed and it confirmed to be in the pleura with fluoro.  We sutured in place with 3-0 nylon at the entrance and exit site and then drained 1500 mL of serous fluid.  Dry sterile dressing was applied.  The patient was then turned to the supine position and prepped and draped in usual sterile manner.  An area was infiltrated 1% Xylocaine above the clavicle and right IJ  puncture was performed.  A guidewire threaded under fluoro to the right atrium. Another area was infiltrated with 1% Xylocaine inferior to this and a transverse incision was made, and a pocket was dissected out down to the pectoral fascia.  A 9.6 preattached Bard Port-A-Cath was inserted and sutured in place with 0 silk and then the tubing tunnel from the pocket to the stab wound around the guidewire.  Under fluoro guidance, it was measured to go the right atrial SVC junction and cut appropriately. Over the guidewire, was passed a dilator with a peel-away sheath.  The dilator and the guidewire removed.  The tubing was passed through the peel-away sheath. The peel-away sheath was removed.  It was confirmed to be in the distal SVC with fluoro.  Wound was closed with 3-0 Vicryl and Dermabond for the skin.  It was flushed easily and withdrew easily.  The patient tolerated the procedure well and was returned to the recovery room in stable condition.     Ines Bloomer, M.D.     DPB/MEDQ  D:  02/08/2011  T:  02/09/2011  Job:  [id number]  cc:   Lajuana Matte, MD  Electronically  Signed by Jovita Gamma M.D. on 02/09/2011 09:15:37 AM

## 2011-02-10 ENCOUNTER — Other Ambulatory Visit (HOSPITAL_COMMUNITY): Payer: Medicare Other

## 2011-02-11 ENCOUNTER — Other Ambulatory Visit: Payer: Self-pay | Admitting: Internal Medicine

## 2011-02-11 ENCOUNTER — Ambulatory Visit (HOSPITAL_COMMUNITY)
Admission: RE | Admit: 2011-02-11 | Discharge: 2011-02-11 | Disposition: A | Discharge status: Home / Self Care | Payer: Medicare Other | Source: Ambulatory Visit | Attending: Internal Medicine | Admitting: Internal Medicine

## 2011-02-11 ENCOUNTER — Encounter (HOSPITAL_BASED_OUTPATIENT_CLINIC_OR_DEPARTMENT_OTHER): Payer: Medicare Other | Admitting: Internal Medicine

## 2011-02-11 DIAGNOSIS — R059 Cough, unspecified: Secondary | ICD-10-CM | POA: Insufficient documentation

## 2011-02-11 DIAGNOSIS — Z1389 Encounter for screening for other disorder: Secondary | ICD-10-CM

## 2011-02-11 DIAGNOSIS — R079 Chest pain, unspecified: Secondary | ICD-10-CM | POA: Insufficient documentation

## 2011-02-11 DIAGNOSIS — K7689 Other specified diseases of liver: Secondary | ICD-10-CM | POA: Insufficient documentation

## 2011-02-11 DIAGNOSIS — R0602 Shortness of breath: Secondary | ICD-10-CM | POA: Insufficient documentation

## 2011-02-11 DIAGNOSIS — C3490 Malignant neoplasm of unspecified part of unspecified bronchus or lung: Secondary | ICD-10-CM

## 2011-02-11 DIAGNOSIS — R05 Cough: Secondary | ICD-10-CM | POA: Insufficient documentation

## 2011-02-11 DIAGNOSIS — J984 Other disorders of lung: Secondary | ICD-10-CM | POA: Insufficient documentation

## 2011-02-11 DIAGNOSIS — J90 Pleural effusion, not elsewhere classified: Secondary | ICD-10-CM | POA: Insufficient documentation

## 2011-02-11 LAB — MAGNESIUM: Magnesium: 1.8 mg/dL (ref 1.5–2.5)

## 2011-02-11 MED ORDER — IOHEXOL 300 MG/ML  SOLN: 80.0000 mL | Freq: Once | INTRAMUSCULAR | Status: AC | PRN

## 2011-02-14 ENCOUNTER — Other Ambulatory Visit: Payer: Self-pay | Admitting: Internal Medicine

## 2011-02-14 ENCOUNTER — Encounter (HOSPITAL_BASED_OUTPATIENT_CLINIC_OR_DEPARTMENT_OTHER): Payer: Medicare Other | Admitting: Internal Medicine

## 2011-02-14 DIAGNOSIS — Z1389 Encounter for screening for other disorder: Secondary | ICD-10-CM

## 2011-02-14 DIAGNOSIS — C3490 Malignant neoplasm of unspecified part of unspecified bronchus or lung: Secondary | ICD-10-CM

## 2011-02-14 LAB — CBC WITH DIFFERENTIAL/PLATELET
Basophils Absolute: 0 10*3/uL (ref 0.0–0.1)
EOS%: 4.8 % (ref 0.0–7.0)
Eosinophils Absolute: 0.3 10*3/uL (ref 0.0–0.5)
HCT: 42.3 % (ref 38.4–49.9)
HGB: 13.6 g/dL (ref 13.0–17.1)
MCH: 27.8 pg (ref 27.2–33.4)
NEUT#: 4.5 10*3/uL (ref 1.5–6.5)
NEUT%: 74.8 % (ref 39.0–75.0)
lymph#: 0.8 10*3/uL — ABNORMAL LOW (ref 0.9–3.3)

## 2011-02-14 LAB — COMPREHENSIVE METABOLIC PANEL
Albumin: 3 g/dL — ABNORMAL LOW (ref 3.5–5.2)
Alkaline Phosphatase: 65 U/L (ref 39–117)
BUN: 7 mg/dL (ref 6–23)
CO2: 29 mEq/L (ref 19–32)
Calcium: 8.8 mg/dL (ref 8.4–10.5)
Chloride: 100 mEq/L (ref 96–112)
Glucose, Bld: 310 mg/dL — ABNORMAL HIGH (ref 70–99)
Potassium: 4.4 mEq/L (ref 3.5–5.3)
Sodium: 137 mEq/L (ref 135–145)
Total Protein: 6.3 g/dL (ref 6.0–8.3)

## 2011-02-14 LAB — LACTATE DEHYDROGENASE: LDH: 145 U/L (ref 94–250)

## 2011-02-14 LAB — MAGNESIUM: Magnesium: 1.8 mg/dL (ref 1.5–2.5)

## 2011-02-15 ENCOUNTER — Ambulatory Visit: Payer: Medicare Other | Admitting: Thoracic Surgery

## 2011-02-15 ENCOUNTER — Encounter (HOSPITAL_BASED_OUTPATIENT_CLINIC_OR_DEPARTMENT_OTHER): Payer: Medicare Other | Admitting: Internal Medicine

## 2011-02-15 ENCOUNTER — Other Ambulatory Visit: Payer: Self-pay | Admitting: Internal Medicine

## 2011-02-15 DIAGNOSIS — Z5112 Encounter for antineoplastic immunotherapy: Secondary | ICD-10-CM

## 2011-02-15 DIAGNOSIS — Z5111 Encounter for antineoplastic chemotherapy: Secondary | ICD-10-CM

## 2011-02-15 DIAGNOSIS — C3490 Malignant neoplasm of unspecified part of unspecified bronchus or lung: Secondary | ICD-10-CM

## 2011-02-15 LAB — RESEARCH LABS

## 2011-02-17 ENCOUNTER — Ambulatory Visit (INDEPENDENT_AMBULATORY_CARE_PROVIDER_SITE_OTHER): Payer: Self-pay | Admitting: Thoracic Surgery

## 2011-02-17 DIAGNOSIS — J910 Malignant pleural effusion: Secondary | ICD-10-CM

## 2011-02-18 NOTE — Letter (Signed)
February 17, 2011  Mohamed K. Arbutus Ped, MD [address] [place], [place] [address]  Re:  Johnathan Bowman, Johnathan Bowman                DOB:  [birth year]  Dear Arbutus Ped:  I saw the patient back today.  His blood pressure is 146/95, pulse 100, respirations 18, sats were 95%.  His PleurX is draining yesterday, questionable 200 mL removed.  His Port-A-Cath is in good position and works well.  I will plan to have him drained 2 times a week and we will see him back in 2 weeks with a chest x-ray.  He is breathing much better since he has had his PleurX inserted.  Ines Bloomer, M.D. Electronically Signed  DPB/MEDQ  D:  02/17/2011  T:  02/17/2011  Job:  [id number]

## 2011-02-24 ENCOUNTER — Encounter (HOSPITAL_BASED_OUTPATIENT_CLINIC_OR_DEPARTMENT_OTHER): Payer: Medicare Other | Admitting: Internal Medicine

## 2011-02-24 ENCOUNTER — Other Ambulatory Visit: Payer: Self-pay | Admitting: Internal Medicine

## 2011-02-24 DIAGNOSIS — C3490 Malignant neoplasm of unspecified part of unspecified bronchus or lung: Secondary | ICD-10-CM

## 2011-02-24 LAB — CBC WITH DIFFERENTIAL/PLATELET
Eosinophils Absolute: 0.2 10*3/uL (ref 0.0–0.5)
HCT: 41.9 % (ref 38.4–49.9)
HGB: 12.9 g/dL — ABNORMAL LOW (ref 13.0–17.1)
LYMPH%: 26.6 % (ref 14.0–49.0)
MONO#: 0.2 10*3/uL (ref 0.1–0.9)
NEUT#: 2.7 10*3/uL (ref 1.5–6.5)
NEUT%: 63.8 % (ref 39.0–75.0)
Platelets: 255 10*3/uL (ref 140–400)

## 2011-02-24 LAB — COMPREHENSIVE METABOLIC PANEL
CO2: 28 mEq/L (ref 19–32)
Creatinine, Ser: 0.92 mg/dL (ref 0.40–1.50)
Glucose, Bld: 191 mg/dL — ABNORMAL HIGH (ref 70–99)
Sodium: 137 mEq/L (ref 135–145)
Total Bilirubin: 0.3 mg/dL (ref 0.3–1.2)
Total Protein: 6.3 g/dL (ref 6.0–8.3)

## 2011-02-24 NOTE — Progress Notes (Signed)
Summary: fu appts ?  Phone Note Outgoing Call   Summary of Call: he went to ER 01/29/2011 for dysnea. When is his appt with Dr Shirline Frees and fu wiuth me ? Initial call taken by: Kalman Shan MD,  February 03, 2011 12:43 PM  Follow-up for Phone Call        Per last OV note pt was to follow-up with you in 2-3 months remionder has been placed for this. Pt saw Dr. Shirline Frees on 02-01-11 and has follow-up with him on 02-14-11. Requested OV note top be faxed. Carron Curie CMA  February 08, 2011 12:05 PM\par  Additional Follow-up for Phone Call Additional follow up Details #1::        [ok thanks. Wil sign off Additional Follow-up by: Kalman Shan MD,  February 13, 2011 10:20 PM

## 2011-02-28 LAB — GLUCOSE, CAPILLARY: Glucose-Capillary: 173 mg/dL — ABNORMAL HIGH (ref 70–99)

## 2011-03-02 ENCOUNTER — Other Ambulatory Visit: Payer: Self-pay | Admitting: Thoracic Surgery

## 2011-03-02 ENCOUNTER — Ambulatory Visit (INDEPENDENT_AMBULATORY_CARE_PROVIDER_SITE_OTHER): Payer: Self-pay | Admitting: Thoracic Surgery

## 2011-03-02 ENCOUNTER — Ambulatory Visit
Admission: RE | Admit: 2011-03-02 | Discharge: 2011-03-02 | Disposition: A | Discharge status: Home / Self Care | Payer: Medicare Other | Source: Ambulatory Visit | Attending: Thoracic Surgery | Admitting: Thoracic Surgery

## 2011-03-02 DIAGNOSIS — J90 Pleural effusion, not elsewhere classified: Secondary | ICD-10-CM

## 2011-03-02 DIAGNOSIS — J910 Malignant pleural effusion: Secondary | ICD-10-CM

## 2011-03-03 ENCOUNTER — Encounter (HOSPITAL_BASED_OUTPATIENT_CLINIC_OR_DEPARTMENT_OTHER): Payer: Medicare Other | Admitting: Internal Medicine

## 2011-03-03 ENCOUNTER — Other Ambulatory Visit: Payer: Self-pay | Admitting: Internal Medicine

## 2011-03-03 DIAGNOSIS — C3490 Malignant neoplasm of unspecified part of unspecified bronchus or lung: Secondary | ICD-10-CM

## 2011-03-03 LAB — COMPREHENSIVE METABOLIC PANEL
ALT: 19 U/L (ref 0–53)
Albumin: 3.5 g/dL (ref 3.5–5.2)
CO2: 22 mEq/L (ref 19–32)
Calcium: 8.7 mg/dL (ref 8.4–10.5)
Chloride: 105 mEq/L (ref 96–112)
Potassium: 4.4 mEq/L (ref 3.5–5.3)
Sodium: 138 mEq/L (ref 135–145)
Total Protein: 6.2 g/dL (ref 6.0–8.3)

## 2011-03-03 LAB — CBC WITH DIFFERENTIAL/PLATELET
BASO%: 3 % — ABNORMAL HIGH (ref 0.0–2.0)
HCT: 41.5 % (ref 38.4–49.9)
MCHC: 31.3 g/dL — ABNORMAL LOW (ref 32.0–36.0)
MONO#: 0.7 10*3/uL (ref 0.1–0.9)
NEUT%: 24.6 % — ABNORMAL LOW (ref 39.0–75.0)
RDW: 14.3 % (ref 11.0–14.6)
WBC: 2.7 10*3/uL — ABNORMAL LOW (ref 4.0–10.3)
lymph#: 1.2 10*3/uL (ref 0.9–3.3)

## 2011-03-03 NOTE — Assessment & Plan Note (Signed)
OFFICE VISIT  HARRIS, PENTON DOB:  Nov 14, 1929                                        March 02, 2011 CHART #:  [id number]  The patient came today.  He is draining only 100 mL.  His chest x-ray showed no evidence of effusion.  We drained him today and only got 100 mL.  I think we will go ahead and drain him on Friday and then on Monday, I will drain him and then put him in talc.  I appreciate the opportunity.  His blood pressure is 150/88, pulse 100, respirations 20, sats were 92%.  Ines Bloomer, M.D. Electronically Signed  DPB/MEDQ  D:  03/02/2011  T:  03/03/2011  Job:  [id number]

## 2011-03-07 ENCOUNTER — Ambulatory Visit (HOSPITAL_COMMUNITY)
Admission: RE | Admit: 2011-03-07 | Discharge: 2011-03-07 | Disposition: A | Discharge status: Home / Self Care | Payer: Medicare Other | Source: Ambulatory Visit | Attending: Thoracic Surgery | Admitting: Thoracic Surgery

## 2011-03-07 DIAGNOSIS — C801 Malignant (primary) neoplasm, unspecified: Secondary | ICD-10-CM | POA: Insufficient documentation

## 2011-03-07 DIAGNOSIS — J90 Pleural effusion, not elsewhere classified: Secondary | ICD-10-CM

## 2011-03-07 DIAGNOSIS — C3490 Malignant neoplasm of unspecified part of unspecified bronchus or lung: Secondary | ICD-10-CM

## 2011-03-07 DIAGNOSIS — R0602 Shortness of breath: Secondary | ICD-10-CM

## 2011-03-07 DIAGNOSIS — J910 Malignant pleural effusion: Secondary | ICD-10-CM | POA: Insufficient documentation

## 2011-03-08 NOTE — Op Note (Signed)
  Johnathan Bowman, Johnathan Bowman                ACCOUNT NO.:  [id number]  MEDICAL RECORD NO.:  [id number]           PATIENT TYPE:  O  LOCATION:  2855                         FACILITY:  [facility]  PHYSICIAN:  Ines Bloomer, M.D. DATE OF BIRTH:  December 21, 1928  DATE OF PROCEDURE:  03/07/2011 DATE OF DISCHARGE:                              OPERATIVE REPORT   PREOPERATIVE DIAGNOSIS:  Malignant left pleural effusion, status post Pleurx insertion.  POSTOPERATIVE DIAGNOSIS:  Malignant left pleural effusion, status post Pleurx insertion.  OPERATION PERFORMED:  Drainage of pleural effusion, removal of Pleurx, talc pleurodesis.  SURGEON:  Ines Bloomer, MD.  ANESTHESIA:  Xylocaine 1%.  After removing the bandage from the thorax, we then connected the connector to the Pleurx cuff and a stopcock and removed 100 mL of serous fluid.  We then inserted 2.5 to 3 g of talc slurry through the Pleurx catheter into the chest.  We cut the suture hold in the Pleurx catheter and then injected the exit site with 2% Xylocaine after then prepped. We then dissected up the cuff on the Pleurx catheter and removed the Pleurx catheter and sutured the exit site with one 3-0 nylon.  Sterile dressing applied.  The patient tolerated the procedure well.     Ines Bloomer, M.D.     DPB/MEDQ  D:  03/07/2011  T:  03/08/2011  Job:  [id number]  Electronically Signed by Jovita Gamma M.D. on 03/08/2011 05:44:36 PM

## 2011-03-10 ENCOUNTER — Other Ambulatory Visit: Payer: Self-pay | Admitting: Internal Medicine

## 2011-03-10 ENCOUNTER — Ambulatory Visit (HOSPITAL_COMMUNITY): Payer: Medicare Other | Attending: Internal Medicine

## 2011-03-10 ENCOUNTER — Encounter (HOSPITAL_BASED_OUTPATIENT_CLINIC_OR_DEPARTMENT_OTHER): Payer: Medicare Other | Admitting: Internal Medicine

## 2011-03-10 DIAGNOSIS — R05 Cough: Secondary | ICD-10-CM | POA: Insufficient documentation

## 2011-03-10 DIAGNOSIS — Z5112 Encounter for antineoplastic immunotherapy: Secondary | ICD-10-CM

## 2011-03-10 DIAGNOSIS — R0602 Shortness of breath: Secondary | ICD-10-CM | POA: Insufficient documentation

## 2011-03-10 DIAGNOSIS — C3490 Malignant neoplasm of unspecified part of unspecified bronchus or lung: Secondary | ICD-10-CM

## 2011-03-10 DIAGNOSIS — Z5111 Encounter for antineoplastic chemotherapy: Secondary | ICD-10-CM

## 2011-03-10 DIAGNOSIS — J90 Pleural effusion, not elsewhere classified: Secondary | ICD-10-CM | POA: Insufficient documentation

## 2011-03-10 DIAGNOSIS — R059 Cough, unspecified: Secondary | ICD-10-CM | POA: Insufficient documentation

## 2011-03-10 DIAGNOSIS — Z1389 Encounter for screening for other disorder: Secondary | ICD-10-CM

## 2011-03-10 LAB — CBC WITH DIFFERENTIAL/PLATELET
BASO%: 0.1 % (ref 0.0–2.0)
EOS%: 0.1 % (ref 0.0–7.0)
MCH: 27.7 pg (ref 27.2–33.4)
MCHC: 32.8 g/dL (ref 32.0–36.0)
MONO#: 1.1 10*3/uL — ABNORMAL HIGH (ref 0.1–0.9)
RBC: 4.48 10*6/uL (ref 4.20–5.82)
WBC: 11.9 10*3/uL — ABNORMAL HIGH (ref 4.0–10.3)
lymph#: 0.7 10*3/uL — ABNORMAL LOW (ref 0.9–3.3)

## 2011-03-10 LAB — COMPREHENSIVE METABOLIC PANEL
ALT: 16 U/L (ref 0–53)
AST: 19 U/L (ref 0–37)
Albumin: 2.7 g/dL — ABNORMAL LOW (ref 3.5–5.2)
Alkaline Phosphatase: 67 U/L (ref 39–117)
Calcium: 8.8 mg/dL (ref 8.4–10.5)
Chloride: 101 mEq/L (ref 96–112)
Creatinine, Ser: 0.94 mg/dL (ref 0.40–1.50)
Potassium: 4.1 mEq/L (ref 3.5–5.3)

## 2011-03-10 LAB — PROTEIN / CREATININE RATIO, URINE
Creatinine, Urine: 241.2 mg/dL
Total Protein, Urine: 184 mg/dL

## 2011-03-10 LAB — UA PROTEIN, DIPSTICK - CHCC: Protein, Urine: 100 mg/dL

## 2011-03-16 ENCOUNTER — Ambulatory Visit (INDEPENDENT_AMBULATORY_CARE_PROVIDER_SITE_OTHER): Payer: Self-pay | Admitting: Thoracic Surgery

## 2011-03-16 DIAGNOSIS — J910 Malignant pleural effusion: Secondary | ICD-10-CM

## 2011-03-17 ENCOUNTER — Other Ambulatory Visit: Payer: Self-pay | Admitting: Internal Medicine

## 2011-03-17 ENCOUNTER — Encounter (HOSPITAL_BASED_OUTPATIENT_CLINIC_OR_DEPARTMENT_OTHER): Payer: Medicare Other | Admitting: Internal Medicine

## 2011-03-17 DIAGNOSIS — Z5112 Encounter for antineoplastic immunotherapy: Secondary | ICD-10-CM

## 2011-03-17 DIAGNOSIS — C3490 Malignant neoplasm of unspecified part of unspecified bronchus or lung: Secondary | ICD-10-CM

## 2011-03-17 DIAGNOSIS — Z1389 Encounter for screening for other disorder: Secondary | ICD-10-CM

## 2011-03-17 LAB — CBC WITH DIFFERENTIAL/PLATELET
BASO%: 0.2 % (ref 0.0–2.0)
Eosinophils Absolute: 0.2 10*3/uL (ref 0.0–0.5)
LYMPH%: 48.7 % (ref 14.0–49.0)
MCHC: 32.8 g/dL (ref 32.0–36.0)
MONO#: 0 10*3/uL — ABNORMAL LOW (ref 0.1–0.9)
NEUT#: 0.8 10*3/uL — ABNORMAL LOW (ref 1.5–6.5)
Platelets: 161 10*3/uL (ref 140–400)
RBC: 4.41 10*6/uL (ref 4.20–5.82)
WBC: 2 10*3/uL — ABNORMAL LOW (ref 4.0–10.3)
lymph#: 1 10*3/uL (ref 0.9–3.3)

## 2011-03-17 LAB — COMPREHENSIVE METABOLIC PANEL
ALT: 17 U/L (ref 0–53)
CO2: 22 mEq/L (ref 19–32)
Sodium: 135 mEq/L (ref 135–145)
Total Bilirubin: 0.4 mg/dL (ref 0.3–1.2)
Total Protein: 6.4 g/dL (ref 6.0–8.3)

## 2011-03-17 LAB — UA PROTEIN, DIPSTICK - CHCC: Protein, Urine: 30 mg/dL

## 2011-03-17 NOTE — Letter (Signed)
March 16, 2011  Velora Heckler. Arbutus Ped, MD [address] [place], [place] [address]  Re:  Johnathan Bowman, Johnathan Bowman                DOB:  [birth year]  Dear Shirline Frees,  I saw the patient back today.  He is having a lot of arthritis and passing lot of gas that was his main complaint.  We removed his stitch from where he had his Pleurx in.  His x-ray on March 22, those read as increased effusion, but this is probably all reaction from the talc that we placed.  His blood pressure is 141/94, pulse 100, respirations 18, and sats were 95%.  I will plan to see him back again in 3 weeks with another chest x-ray.  Ines Bloomer, M.D. Electronically Signed  DPB/MEDQ  D:  03/16/2011  T:  03/17/2011  Job:  [id number]

## 2011-03-24 ENCOUNTER — Other Ambulatory Visit: Payer: Self-pay | Admitting: Internal Medicine

## 2011-03-24 ENCOUNTER — Encounter (HOSPITAL_BASED_OUTPATIENT_CLINIC_OR_DEPARTMENT_OTHER): Payer: Medicare Other | Admitting: Internal Medicine

## 2011-03-24 DIAGNOSIS — Z1389 Encounter for screening for other disorder: Secondary | ICD-10-CM

## 2011-03-24 DIAGNOSIS — C3490 Malignant neoplasm of unspecified part of unspecified bronchus or lung: Secondary | ICD-10-CM

## 2011-03-24 LAB — CBC WITH DIFFERENTIAL/PLATELET
BASO%: 1.5 % (ref 0.0–2.0)
EOS%: 7.7 % — ABNORMAL HIGH (ref 0.0–7.0)
LYMPH%: 61 % — ABNORMAL HIGH (ref 14.0–49.0)
MCHC: 31.8 g/dL — ABNORMAL LOW (ref 32.0–36.0)
MCV: 84.9 fL (ref 79.3–98.0)
MONO#: 0.5 10*3/uL (ref 0.1–0.9)
MONO%: 23.1 % — ABNORMAL HIGH (ref 0.0–14.0)
Platelets: 340 10*3/uL (ref 140–400)
RBC: 4.49 10*6/uL (ref 4.20–5.82)
WBC: 2 10*3/uL — ABNORMAL LOW (ref 4.0–10.3)
nRBC: 0 % (ref 0–0)

## 2011-03-24 LAB — COMPREHENSIVE METABOLIC PANEL
AST: 16 U/L (ref 0–37)
Alkaline Phosphatase: 75 U/L (ref 39–117)
BUN: 15 mg/dL (ref 6–23)
Creatinine, Ser: 1.16 mg/dL (ref 0.40–1.50)
Total Bilirubin: 0.2 mg/dL — ABNORMAL LOW (ref 0.3–1.2)

## 2011-03-25 ENCOUNTER — Encounter (HOSPITAL_BASED_OUTPATIENT_CLINIC_OR_DEPARTMENT_OTHER): Payer: Medicare Other | Admitting: Internal Medicine

## 2011-03-25 DIAGNOSIS — C3490 Malignant neoplasm of unspecified part of unspecified bronchus or lung: Secondary | ICD-10-CM

## 2011-03-26 ENCOUNTER — Encounter: Payer: Medicare Other | Admitting: Internal Medicine

## 2011-03-28 ENCOUNTER — Encounter (HOSPITAL_COMMUNITY): Payer: Self-pay

## 2011-03-28 ENCOUNTER — Ambulatory Visit (HOSPITAL_COMMUNITY)
Admission: RE | Admit: 2011-03-28 | Discharge: 2011-03-28 | Disposition: A | Discharge status: Home / Self Care | Payer: Medicare Other | Source: Ambulatory Visit | Attending: Internal Medicine | Admitting: Internal Medicine

## 2011-03-28 DIAGNOSIS — I2584 Coronary atherosclerosis due to calcified coronary lesion: Secondary | ICD-10-CM | POA: Insufficient documentation

## 2011-03-28 DIAGNOSIS — J90 Pleural effusion, not elsewhere classified: Secondary | ICD-10-CM | POA: Insufficient documentation

## 2011-03-28 DIAGNOSIS — Z79899 Other long term (current) drug therapy: Secondary | ICD-10-CM | POA: Insufficient documentation

## 2011-03-28 DIAGNOSIS — N200 Calculus of kidney: Secondary | ICD-10-CM | POA: Insufficient documentation

## 2011-03-28 DIAGNOSIS — R109 Unspecified abdominal pain: Secondary | ICD-10-CM | POA: Insufficient documentation

## 2011-03-28 DIAGNOSIS — R197 Diarrhea, unspecified: Secondary | ICD-10-CM | POA: Insufficient documentation

## 2011-03-28 DIAGNOSIS — J438 Other emphysema: Secondary | ICD-10-CM | POA: Insufficient documentation

## 2011-03-28 DIAGNOSIS — C3490 Malignant neoplasm of unspecified part of unspecified bronchus or lung: Secondary | ICD-10-CM | POA: Insufficient documentation

## 2011-03-28 HISTORY — DX: Malignant (primary) neoplasm, unspecified: C80.1

## 2011-03-28 MED ORDER — IOHEXOL 300 MG/ML  SOLN: 100.0000 mL | Freq: Once | INTRAMUSCULAR | Status: AC | PRN

## 2011-03-31 ENCOUNTER — Other Ambulatory Visit: Payer: Self-pay | Admitting: Internal Medicine

## 2011-03-31 ENCOUNTER — Encounter (HOSPITAL_BASED_OUTPATIENT_CLINIC_OR_DEPARTMENT_OTHER): Payer: Medicare Other | Admitting: Internal Medicine

## 2011-03-31 DIAGNOSIS — C3490 Malignant neoplasm of unspecified part of unspecified bronchus or lung: Secondary | ICD-10-CM

## 2011-03-31 DIAGNOSIS — Z1389 Encounter for screening for other disorder: Secondary | ICD-10-CM

## 2011-03-31 DIAGNOSIS — Z5112 Encounter for antineoplastic immunotherapy: Secondary | ICD-10-CM

## 2011-03-31 DIAGNOSIS — Z5111 Encounter for antineoplastic chemotherapy: Secondary | ICD-10-CM

## 2011-03-31 LAB — COMPREHENSIVE METABOLIC PANEL
Albumin: 2.9 g/dL — ABNORMAL LOW (ref 3.5–5.2)
Alkaline Phosphatase: 89 U/L (ref 39–117)
BUN: 8 mg/dL (ref 6–23)
CO2: 25 mEq/L (ref 19–32)
Glucose, Bld: 232 mg/dL — ABNORMAL HIGH (ref 70–99)
Potassium: 3.8 mEq/L (ref 3.5–5.3)

## 2011-03-31 LAB — MAGNESIUM: Magnesium: 1.6 mg/dL (ref 1.5–2.5)

## 2011-03-31 LAB — CBC WITH DIFFERENTIAL/PLATELET
Basophils Absolute: 0 10*3/uL (ref 0.0–0.1)
EOS%: 1.3 % (ref 0.0–7.0)
HCT: 36.8 % — ABNORMAL LOW (ref 38.4–49.9)
HGB: 12 g/dL — ABNORMAL LOW (ref 13.0–17.1)
MCH: 27.7 pg (ref 27.2–33.4)
MCV: 85.2 fL (ref 79.3–98.0)
MONO%: 12.9 % (ref 0.0–14.0)
NEUT%: 68.8 % (ref 39.0–75.0)
lymph#: 1.1 10*3/uL (ref 0.9–3.3)

## 2011-03-31 LAB — LACTATE DEHYDROGENASE: LDH: 176 U/L (ref 94–250)

## 2011-04-01 ENCOUNTER — Encounter (HOSPITAL_BASED_OUTPATIENT_CLINIC_OR_DEPARTMENT_OTHER): Payer: Medicare Other | Admitting: Internal Medicine

## 2011-04-01 DIAGNOSIS — C3490 Malignant neoplasm of unspecified part of unspecified bronchus or lung: Secondary | ICD-10-CM

## 2011-04-05 ENCOUNTER — Other Ambulatory Visit: Payer: Self-pay | Admitting: Thoracic Surgery

## 2011-04-05 DIAGNOSIS — J90 Pleural effusion, not elsewhere classified: Secondary | ICD-10-CM

## 2011-04-06 ENCOUNTER — Ambulatory Visit
Admission: RE | Admit: 2011-04-06 | Discharge: 2011-04-06 | Disposition: A | Discharge status: Home / Self Care | Payer: Medicare Other | Source: Ambulatory Visit | Attending: Thoracic Surgery | Admitting: Thoracic Surgery

## 2011-04-06 ENCOUNTER — Ambulatory Visit (INDEPENDENT_AMBULATORY_CARE_PROVIDER_SITE_OTHER): Payer: Medicare Other | Admitting: Thoracic Surgery

## 2011-04-06 DIAGNOSIS — J910 Malignant pleural effusion: Secondary | ICD-10-CM

## 2011-04-06 DIAGNOSIS — J90 Pleural effusion, not elsewhere classified: Secondary | ICD-10-CM

## 2011-04-07 ENCOUNTER — Other Ambulatory Visit: Payer: Self-pay | Admitting: Internal Medicine

## 2011-04-07 ENCOUNTER — Encounter (HOSPITAL_BASED_OUTPATIENT_CLINIC_OR_DEPARTMENT_OTHER): Payer: Medicare Other | Admitting: Internal Medicine

## 2011-04-07 DIAGNOSIS — C3490 Malignant neoplasm of unspecified part of unspecified bronchus or lung: Secondary | ICD-10-CM

## 2011-04-07 DIAGNOSIS — Z5112 Encounter for antineoplastic immunotherapy: Secondary | ICD-10-CM

## 2011-04-07 LAB — CBC WITH DIFFERENTIAL/PLATELET
BASO%: 0.2 % (ref 0.0–2.0)
Basophils Absolute: 0 10*3/uL (ref 0.0–0.1)
Eosinophils Absolute: 0.1 10*3/uL (ref 0.0–0.5)
HCT: 36.9 % — ABNORMAL LOW (ref 38.4–49.9)
HGB: 12.3 g/dL — ABNORMAL LOW (ref 13.0–17.1)
MCHC: 33.4 g/dL (ref 32.0–36.0)
MONO#: 0.3 10*3/uL (ref 0.1–0.9)
NEUT#: 7.7 10*3/uL — ABNORMAL HIGH (ref 1.5–6.5)
NEUT%: 80.2 % — ABNORMAL HIGH (ref 39.0–75.0)
WBC: 9.6 10*3/uL (ref 4.0–10.3)
lymph#: 1.4 10*3/uL (ref 0.9–3.3)

## 2011-04-07 LAB — COMPREHENSIVE METABOLIC PANEL
ALT: 32 U/L (ref 0–53)
CO2: 22 mEq/L (ref 19–32)
Calcium: 9.6 mg/dL (ref 8.4–10.5)
Chloride: 103 mEq/L (ref 96–112)
Creatinine, Ser: 0.88 mg/dL (ref 0.40–1.50)
Glucose, Bld: 237 mg/dL — ABNORMAL HIGH (ref 70–99)

## 2011-04-07 NOTE — Assessment & Plan Note (Signed)
OFFICE VISIT  Johnathan Bowman, Johnathan Bowman DOB:  May 07, 1929                                        April 06, 2011 CHART #:  [id number]  The patient came today.  His blood pressure is 138/93, pulse 100, respirations 20, sats were 92%.  His chest x-ray showed improvement of the left lower lobe aeration with minimal effusion.  His incision is well healed where he had his PleurX and I will refer him back to Dr. Arbutus Ped for long-term followup.  Ines Bloomer, M.D. Electronically Signed  DPB/MEDQ  D:  04/06/2011  T:  04/07/2011  Job:  [id number]  cc:   Mohamed K. Arbutus Ped, M.D.

## 2011-04-14 ENCOUNTER — Encounter (HOSPITAL_BASED_OUTPATIENT_CLINIC_OR_DEPARTMENT_OTHER): Payer: Medicare Other | Admitting: Internal Medicine

## 2011-04-14 ENCOUNTER — Other Ambulatory Visit: Payer: Self-pay | Admitting: Internal Medicine

## 2011-04-14 DIAGNOSIS — Z5112 Encounter for antineoplastic immunotherapy: Secondary | ICD-10-CM

## 2011-04-14 DIAGNOSIS — C3490 Malignant neoplasm of unspecified part of unspecified bronchus or lung: Secondary | ICD-10-CM

## 2011-04-14 LAB — COMPREHENSIVE METABOLIC PANEL
ALT: 18 U/L (ref 0–53)
Albumin: 3.6 g/dL (ref 3.5–5.2)
CO2: 21 mEq/L (ref 19–32)
Calcium: 9 mg/dL (ref 8.4–10.5)
Chloride: 103 mEq/L (ref 96–112)
Sodium: 138 mEq/L (ref 135–145)
Total Protein: 6.4 g/dL (ref 6.0–8.3)

## 2011-04-14 LAB — CBC WITH DIFFERENTIAL/PLATELET
BASO%: 0.3 % (ref 0.0–2.0)
HCT: 36.1 % — ABNORMAL LOW (ref 38.4–49.9)
MCHC: 32.6 g/dL (ref 32.0–36.0)
MONO#: 0.5 10*3/uL (ref 0.1–0.9)
NEUT%: 87.3 % — ABNORMAL HIGH (ref 39.0–75.0)
RBC: 4.23 10*6/uL (ref 4.20–5.82)
WBC: 18.7 10*3/uL — ABNORMAL HIGH (ref 4.0–10.3)
lymph#: 1.7 10*3/uL (ref 0.9–3.3)

## 2011-04-21 ENCOUNTER — Other Ambulatory Visit: Payer: Self-pay | Admitting: Internal Medicine

## 2011-04-21 ENCOUNTER — Encounter (HOSPITAL_BASED_OUTPATIENT_CLINIC_OR_DEPARTMENT_OTHER): Payer: Medicare Other | Admitting: Internal Medicine

## 2011-04-21 DIAGNOSIS — Z1389 Encounter for screening for other disorder: Secondary | ICD-10-CM

## 2011-04-21 DIAGNOSIS — C3490 Malignant neoplasm of unspecified part of unspecified bronchus or lung: Secondary | ICD-10-CM

## 2011-04-21 DIAGNOSIS — Z5112 Encounter for antineoplastic immunotherapy: Secondary | ICD-10-CM

## 2011-04-21 DIAGNOSIS — Z5111 Encounter for antineoplastic chemotherapy: Secondary | ICD-10-CM

## 2011-04-21 LAB — UA PROTEIN, DIPSTICK - CHCC: Protein, Urine: 30 mg/dL

## 2011-04-21 LAB — COMPREHENSIVE METABOLIC PANEL
ALT: 18 U/L (ref 0–53)
AST: 18 U/L (ref 0–37)
Alkaline Phosphatase: 89 U/L (ref 39–117)
Calcium: 9.1 mg/dL (ref 8.4–10.5)
Chloride: 101 mEq/L (ref 96–112)
Creatinine, Ser: 0.81 mg/dL (ref 0.40–1.50)
Potassium: 4.2 mEq/L (ref 3.5–5.3)

## 2011-04-21 LAB — CBC WITH DIFFERENTIAL/PLATELET
BASO%: 0.6 % (ref 0.0–2.0)
LYMPH%: 16.8 % (ref 14.0–49.0)
MCHC: 32.5 g/dL (ref 32.0–36.0)
MONO#: 0.8 10*3/uL (ref 0.1–0.9)
RBC: 3.92 10*6/uL — ABNORMAL LOW (ref 4.20–5.82)
WBC: 6.8 10*3/uL (ref 4.0–10.3)
lymph#: 1.1 10*3/uL (ref 0.9–3.3)

## 2011-04-22 ENCOUNTER — Encounter (HOSPITAL_BASED_OUTPATIENT_CLINIC_OR_DEPARTMENT_OTHER): Payer: Medicare Other | Admitting: Internal Medicine

## 2011-04-22 DIAGNOSIS — C3490 Malignant neoplasm of unspecified part of unspecified bronchus or lung: Secondary | ICD-10-CM

## 2011-04-28 ENCOUNTER — Other Ambulatory Visit: Payer: Self-pay | Admitting: Internal Medicine

## 2011-04-28 ENCOUNTER — Encounter (HOSPITAL_BASED_OUTPATIENT_CLINIC_OR_DEPARTMENT_OTHER): Payer: Medicare Other | Admitting: Internal Medicine

## 2011-04-28 DIAGNOSIS — C3490 Malignant neoplasm of unspecified part of unspecified bronchus or lung: Secondary | ICD-10-CM

## 2011-04-28 DIAGNOSIS — Z5112 Encounter for antineoplastic immunotherapy: Secondary | ICD-10-CM

## 2011-04-28 LAB — CBC WITH DIFFERENTIAL/PLATELET
Basophils Absolute: 0 10*3/uL (ref 0.0–0.1)
Eosinophils Absolute: 0.2 10*3/uL (ref 0.0–0.5)
HCT: 35.5 % — ABNORMAL LOW (ref 38.4–49.9)
LYMPH%: 27.7 % (ref 14.0–49.0)
MCV: 85.7 fL (ref 79.3–98.0)
MONO%: 6.3 % (ref 0.0–14.0)
NEUT#: 1.8 10*3/uL (ref 1.5–6.5)
NEUT%: 57.6 % (ref 39.0–75.0)
Platelets: 93 10*3/uL — ABNORMAL LOW (ref 140–400)
RBC: 4.14 10*6/uL — ABNORMAL LOW (ref 4.20–5.82)
nRBC: 0 % (ref 0–0)

## 2011-04-28 LAB — COMPREHENSIVE METABOLIC PANEL
BUN: 22 mg/dL (ref 6–23)
CO2: 19 mEq/L (ref 19–32)
Calcium: 8.8 mg/dL (ref 8.4–10.5)
Chloride: 101 mEq/L (ref 96–112)
Creatinine, Ser: 0.97 mg/dL (ref 0.40–1.50)
Glucose, Bld: 210 mg/dL — ABNORMAL HIGH (ref 70–99)
Total Bilirubin: 0.5 mg/dL (ref 0.3–1.2)

## 2011-05-05 ENCOUNTER — Other Ambulatory Visit: Payer: Self-pay | Admitting: Internal Medicine

## 2011-05-05 ENCOUNTER — Encounter (HOSPITAL_BASED_OUTPATIENT_CLINIC_OR_DEPARTMENT_OTHER): Payer: Medicare Other | Admitting: Internal Medicine

## 2011-05-05 DIAGNOSIS — C3490 Malignant neoplasm of unspecified part of unspecified bronchus or lung: Secondary | ICD-10-CM

## 2011-05-05 LAB — COMPREHENSIVE METABOLIC PANEL
ALT: 14 U/L (ref 0–53)
CO2: 20 mEq/L (ref 19–32)
Calcium: 9 mg/dL (ref 8.4–10.5)
Chloride: 104 mEq/L (ref 96–112)
Creatinine, Ser: 0.85 mg/dL (ref 0.40–1.50)
Glucose, Bld: 209 mg/dL — ABNORMAL HIGH (ref 70–99)
Total Bilirubin: 0.3 mg/dL (ref 0.3–1.2)
Total Protein: 6.5 g/dL (ref 6.0–8.3)

## 2011-05-05 LAB — CBC WITH DIFFERENTIAL/PLATELET
Eosinophils Absolute: 0.3 10*3/uL (ref 0.0–0.5)
HCT: 34.3 % — ABNORMAL LOW (ref 38.4–49.9)
HGB: 11.2 g/dL — ABNORMAL LOW (ref 13.0–17.1)
LYMPH%: 15.1 % (ref 14.0–49.0)
MONO#: 0.4 10*3/uL (ref 0.1–0.9)
NEUT#: 5.9 10*3/uL (ref 1.5–6.5)
NEUT%: 76.1 % — ABNORMAL HIGH (ref 39.0–75.0)
Platelets: 177 10*3/uL (ref 140–400)
WBC: 7.8 10*3/uL (ref 4.0–10.3)
lymph#: 1.2 10*3/uL (ref 0.9–3.3)

## 2011-05-06 NOTE — Discharge Summary (Signed)
NAMEEMMAUS, BRANDI NO.:  [id number]   MEDICAL RECORD NO.:  [id number]          PATIENT TYPE:  OBV   LOCATION:  6524                         FACILITY:  [facility]   PHYSICIAN:  Nanetta Batty, M.D.   DATE OF BIRTH:  [birth year]   DATE OF ADMISSION:  01/19/2005  DATE OF DISCHARGE:  01/22/2005                                 DISCHARGE SUMMARY   ATTENDING PHYSICIAN:  Dr. Allyson Sabal.   DISCHARGE DIAGNOSES:  1.  Non-ST myocardial infarction this admission; status post catheterization      in 2002 with unsuccessful attempt to intervene on right coronary artery      on medical therapy.  2.  History of cerebrovascular accident.  3.  Diabetes mellitus.  4.  Hyperlipidemia.  5.  Ischemic cardiomyopathy with ejection fraction 45%.  6.  Hypertension.   HPI/HOSPITAL COURSE:  This is a 77 year old African-American gentleman who  presented to [facility] for evaluation of chest pain.  He said that  he had no prior history of coronary disease and presented to the emergency  room in the evening with a history of chest pain that started the night  prior to presentation and the patient was in bed at that time.  They  described pain as a pressure located in a focal spot in the lower substernal  region and did not radiate anywhere but it was no associated dyspnea,  diaphoresis or any nausea, and pain seemed to be exacerbating somewhat by  the patient's movement.  He was admitted on rule-out MI protocol and his  serial enzymes revealed elevated CK and MB fraction but troponins remained  normal.  His first set showed CK of 677, MB 17.5 and troponin 0.1.  Second  set of CK 391, MB 9.8, troponin 0.01, and third set showed CK 294, MB 7.2  and troponin 0.1.  EKG did not show any changes.  Patient was diagnosed with  non ST elevation MI and he was maintained on heparin and Nitrol IV and at  this admission underwent cardiac cath.   HOSPITAL PROCEDURE:  Cardiac catheterization performed  on January 20, 2005,  by Dr. Allyson Sabal, showed EF approximately 45% with moderate hypokinesis.  His  RCA had 100% occlusion in the proximal portion and Dr. Allyson Sabal made an attempt  to cross RCA lesion but it was unsuccessful.  Interval entry was cut off and  decision was made to proceed with medical therapy.   Patient continued to stay in the hospital for 48 hours more to observe him  and make sure he was stable prior to discharge home.  He was enrolled and  tried on study that is new drug with the same quality as Plavix and we note  patient has used research drug with Plavix but he was discharged on the med  provided for him by research nurse.   His other labs in the hospital revealed a hemoglobin 12.9, hematocrit 40,  white blood cell count 9.5, potassium 4.4, sodium 136, BUN 14, creatinine  0.8.   Patient had light ecchymosis of his groin but did not have  any bruit or  hematoma at the puncture site and he is being discharged home with plan to  see Dr. Allyson Sabal in 2 weeks as an outpatient in our office.   DISCHARGE MEDS:  1.  __________ 2 pills every morning.  2.  Aspirin 81 mg daily.  3.  Crestor 10 mg daily.  4.  Protonix 40 mg daily.  5.  Glucophage 500 mg daily, allowed to restart tomorrow.  6.  Glyburide 5 mg daily.  7.  Lotrel 5/10 mg daily.  8.  Coreg 6.25 mg b.i.d.   DISCHARGE ACTIVITIES:  No driving, no exertional activity, no lifting  greater than 5 pounds for 5 days post cath.   DISCHARGE DIET:  Low fat, low cholesterol diet.   Office will notify patient about his appointment with Dr. Allyson Sabal in 2 weeks.      MK/MEDQ  D:  01/22/2005  T:  01/23/2005  Job:  [id number]   cc:   [facility]  [address]  [place], [place] [address]

## 2011-05-06 NOTE — H&P (Signed)
Johnathan Bowman NO.:  [id number]   MEDICAL RECORD NO.:  [id number]          PATIENT TYPE:  EMS   LOCATION:  MAJO                         FACILITY:  [facility]   PHYSICIAN:  Ulyses Amor, MD DATE OF BIRTH:  [birth year]   DATE OF ADMISSION:  01/19/2005  DATE OF DISCHARGE:                                HISTORY & PHYSICAL   HISTORY OF PRESENT ILLNESS:  Johnathan Bowman is a 77 year old black man who is  admitted to [facility] for further evaluation of chest pain.   The patient, who has no past history of cardiac disease, presented to the  emergency department this evening with a history of chest pain which began  yesterday evening.  He was in bed at the time of onset.  The chest pain was  described as a pressure and ache located in a very focal spot in the lower  substernal region.  It did not radiate.  There has no associated dyspnea,  diaphoresis, or nausea.  It seems to be exacerbated by movement of his  torso, movement of his arms, and deep inspiration.  It appears to be  otherwise unrelated to activity or meals.  There are no other exacerbating  or ameliorating factors.  It has continued more or less unabated, in a low  grade fashion, since last night, though it has largely resolved since his  arrival in the emergency department.   The patient, as noted above, has no past history of cardiac disease,  including no history of chest pain, myocardial infarction, coronary artery  disease, congestive heart failure, or arrhythmia.  The patient has a number  of risk factors for coronary artery disease including hypertension,  dyslipidemia, and type 2 diabetes mellitus.  The patient discontinued  smoking approximately 35 years ago.  There is no family history of coronary  artery disease.   In addition to the aforementioned problems, the patient also has a past  history of a cerebrovascular accident.   MEDICATIONS:  1.  Glucophage.  2.  Lotrel.  3.   Crestor.  4.  Aspirin.   ALLERGIES:  PENICILLIN.   PAST SURGICAL HISTORY:  Cataracts.   PAST MEDICAL HISTORY:  Significant injuries; none.   FAMILY HISTORY:  His mother died of old age of natural causes.  His father  died in a motor vehicle accident.  His sister is alive and well.  There is  no family history of any specific medical problems.   SOCIAL HISTORY:  The patient lives with his wife.  He is a retired Curator.  He immigrated here decades ago from [place].  He neither smokes nor drinks.   REVIEW OF SYSTEMS:  No problems related to his head, eyes, ears, nose,  mouth, throat, lungs, gastrointestinal system, genitourinary system, or  extremities.  There is no history of neurologic or psychiatric disorder.  There is no history of fever, chills, or weight loss.   PHYSICAL EXAMINATION:  VITAL SIGNS:  Blood pressure 148/87, pulse 91 and  regular, respirations 18, temperature 98.2, pulse oximetry 95% on room air.  GENERAL:  The  patient was an elderly black man in no discomfort.  He was  alert, oriented, appropriate, and responsive.  HEENT:  Normal.  NECK:  Without thyromegaly or adenopathy.  Carotid pulses are palpable  bilaterally and without bruits.  HEART:  Normal S1 and S2.  There was no S3, S4, murmur, rub, or click.  Cardiac rhythm is regular.  No chest wall tenderness was noted.  LUNGS:  Clear.  ABDOMEN:  Soft and nontender.  There was no mass, tenderness,  hepatosplenomegaly, bruit, distention, rebound, guarding, or rigidity.  Bowel sounds were normal.  RECTAL:  GENITOURINARY:  Not performed as they were not pertinent to the  reason for acute care hospitalization.  EXTREMITIES:  Without edema, deviation, or deformity.  Radial and dorsalis  pedal pulses were palpable bilaterally.  Brief screening neurologic survey  was unremarkable.   The electrocardiogram demonstrated sinus tachycardia with a mild nonspecific  ST and T wave abnormality.  The initial set of cardiac  markers revealed a  myoglobin of 460, CK-MB 18.9, and troponin less than 0.05.  The second set  revealed a myoglobin of 280, CK-MB of 20.2, and troponins of less than 0.05.  The third set revealed a myoglobin of 279, CK-MB 30.9, and troponin less  than 0.05.  Potassium was 3.7 with a BUN of 13, and creatinine of 0.9.  The  remaining studies were pending at the time of this dictation.   IMPRESSION:  1.  Chest pain; rule out coronary artery disease.  The discomfort is a very      focal, pressure/ache, discomfort in a small area in the lower substernal      region.  It is worse with movement of the torso and deep inspiration.      CK-MB and myoglobin are elevated, but all of the troponins are negative.  2.  Hypertension.  3.  Dyslipidemia.  4.  Type 2 diabetes mellitus.  5.  Status post cerebrovascular accident.   PLAN:  1.  Telemetry.  2.  Serial cardiac enzymes.  3.  Aspirin.  4.  Intravenous heparin.  5.  Intravenous nitroglycerin.  6.  Chest and abdominal CT scans to explore the aortic dissection and      pulmonary embolus.  7.  Further measurers per Dani Gobble, M.D.      MSC/MEDQ  D:  01/19/2005  T:  01/19/2005  Job:  [id number]   cc:   Dani Gobble, MD  Fax: [phone]

## 2011-05-06 NOTE — Cardiovascular Report (Signed)
NAMEDEKENDRICK, UZELAC NO.:  [id number]   MEDICAL RECORD NO.:  [id number]          PATIENT TYPE:  OBV   LOCATION:  5530                         FACILITY:  [facility]   PHYSICIAN:  Nanetta Batty, M.D.   DATE OF BIRTH:  1929/01/26   DATE OF PROCEDURE:  01/20/2005  DATE OF DISCHARGE:                              CARDIAC CATHETERIZATION   Mr. Lamoureaux is a 77 year old, married, African American male admitted last  night with unstable angina.  He had positive enzymes.  He was placed on IV  heparin and nitroglycerin.  He presents now for diagnostic coronary  arteriography.  He had no acute EKG changes.   PROCEDURE DESCRIPTION:  The patient was brought to the second floor [facility] in the post absorptive state.  He  was premedicated with p.o. Valium.  His right groin was prepped and shaved  in the usual sterile fashion.  One percent Xylocaine was used for local  anesthesia.  A 6 French sheath was inserted into the right femoral artery  using standard Seldinger technique.  Then 6 French right and left Judkins  diagnostic catheters, as well as a 6 [place] catheter, were used for  selective coronary angiography, left ventriculography, subselective left  internal mammary artery angiography and distal abdominal aortography.  Visipaque dye was used for the entirety of the case.  Retrograde aorta, left  ventricular and pullback pressures were recorded.   HEMODYNAMICS:  1.  Aortic systolic pressure 149 and diastolic pressure 80.  2.  Left ventricular systolic pressure 145 and diastolic pressure 12.   SELECTIVE CORONARY ANGIOGRAPHY:  1.  Left main:  Normal.  2.  Left anterior descending:  Minor irregularities with fluoroscopic      calcification in the proximal third.  3.  Left circumflex:  Dominant with a slow filling small first marginal      branch which maybe the culprit vessel.  4.  Ramus intermedius branch:  Medium to large vessel with  minor      irregularities.  5.  Right coronary artery:  Large dominant vessel with total occlusion in      its proximal portion and grade 3 left-to-right and grade 1 right-to-      right collaterals.  6.  Left ventriculography:  RAO left ventriculogram was performed using 25      mL of Visipaque dye at 12 mL/sec.  The overall LVEF was estimated at      anteroposterior 40-45% with mild to moderate inferobasal hypokinesis and      mild global hypokinesia.  7.  Distal abdominal aortography:  Distal abdominal aortogram was performed      using 20 mL of Visipaque dye at 20 mL/sec.  The renal arteries were      widely patent.  The inferior renal abdominal aorta and iliac bifurcation      appeared free of significant atherosclerotic changes.   IMPRESSION:  Mr. Pantoja has a non-Q-wave MI with a totally occluded RCA with  grade 3 collaterals.  He also has a small poorly filling first marginal  branch.  It  is unclear what the culprit vessel is; however, given the large  size of the dominant right, I will plan on probing this to see whether or  not it is recanalizable.   PROCEDURE DESCRIPTION:  The existing 6 French sheath in the right femoral  artery was exchanged over a wire for a 7 [place] sheath.  A 6 French sheath  was the inserted into the right femoral vein.  The patient received 6000  units of heparin with an ACT of 300.  He received Integrillin double bolus  infusion, aspirin and antiplatelet study drug.   Using a 7 [place] hockey stick guide with side holes along with a 190 support  guide wire and a __________ Agricultural engineer.  PCI was attempted.  The  support guide wire was never able to traverse the proximal lesion.  A choice  PT and a Crossit 200 were also used with the use of the balloon as back  up, however, I was never able to cross the lesion, making me suspect that  this was a chronic total with __________ collaterals and possibly the acute  event was occlusion of the small first  marginal branch.  The procedure was  aborted.  The guide wire and catheters were removed and the sheaths were  sewn securely in place.  The patient left the laboratory in stable  condition.  Plans will be for continued medical therapy.  The Integrillin,  heparin and nitroglycerin were all discontinued at the end of the procedure  and the patient left the laboratory in stable condition.      JB/MEDQ  D:  01/20/2005  T:  01/20/2005  Job:  [id number]   cc:   Second Floor Cardiac Catheterization Lab   [facility]  [address]  [place], [place] [address]   Wilson Singer, M.D.  [address]  [place]  [place] [address]  Fax: [phone]

## 2011-05-06 NOTE — H&P (Signed)
[place]  Patient:    Johnathan Bowman, Johnathan Bowman Visit Number: [id number] MRN: [id number]          Service Type: MED Location: [id number] Attending Physician:  Wilson Singer Dictated by:   Erskine Speed, M.D. Admit Date:  05/21/2002 Discharge Date: 05/22/2002                           History and Physical  PROBLEM #1:  Chest pain.  SUBJECTIVE:  This 77 year old patient of Dr. Lilly Cove has had a few episodes prior to tonights episode of chest pain which he describes as a sharp, sticking pain in the left chest.  There is some tenderness when he touches the rib cage in that area and sometimes he feels he needs to burp. Today at 2 p.m., the patient had the exact same chest pain as described above for two hours.  After burping and taking a gulp of Pepto, he was pain-free but he got worried about it, so he came to the [facility] Emergency Room. Sometimes he feels like his left arm is numb -- today -- but had no radiating pain to the arm or the neck.  At 10:45 p.m. (the patient was first seen in the emergency room five hours earlier), I was called to see this patient and admit him with chest pain.  The patient also gives a history that he has a tight feeling in his neck when he does his sport walking, but he did not have any of that discomfort today.  He was seen by EDP to rule out myocardial infarction and because of risk factors, I admitted this patient to Dr. Balinda Quails service.  PAST HISTORY:  The patient had a spring hit him in the left side of the face (he is an Journalist, newspaper), sustaining a left facial fracture in 1996.  ALLERGIES:  PENICILLIN.  SOCIAL HISTORY:  A pack a day for 20 years but stopped 30 years ago.  Alcohol: Just occasional.  CURRENT MEDICATIONS: 1. Glucovance one b.i.d. 2. Zocor 20 mg daily. 3. A blood pressure medicine but he was not sure what type. 4. Eye drops for glaucoma and he is not sure of what type.  PROBLEM #2:   Diabetes.  SUBJECTIVE:  The patient has been on the Glucovance for a few years but does not do any home glucose monitoring; he is supposed to but he never has.  PROBLEM #3:  Elevated cholesterol.  PROBLEM #4:  Glaucoma.  PHYSICAL EXAMINATION:  VITAL SIGNS:  Blood pressure was 150/90.  Respirations were normal.  Pulse was 80.  Temperature was 98.8.  Pulse oximetry was 93%.  HEENT:  Head was normal.  Eyes show pupils to be equal, round and reactive to light.  Extraocular muscles were intact.  The patient had bilateral pterygiums.  Oral exam revealed upper and lower dental plates.  NECK:  Normal.  Carotids were 2+ without bruit.  Thyroid was negative.  There were no neck masses.  Jugular venous pulses were normal.  CHEST:  Clear to auscultation and percussion.  CARDIOVASCULAR:  Regular rhythm and normal without murmur.  S1 and S2 were normal.  There was no gallop, thrill, heave, rub or murmur.  ABDOMEN:  Soft, flat and nontender.  No organomegaly or abnormal masses. Bowel sounds are normal.  There are no abdominal bruits.  GU:  Exam is negative.  RECTAL:  Deferred.  SKIN:  Unremarkable.  EXTREMITIES:  Joint survey was negative for his age.  Distal circulation was +3 bilaterally.  NEUROLOGIC:  Exam was negative.  ADMISSION LABORATORY AND ACCESSORY DATA:  Chest x-ray revealed no active disease.  EKG revealed no active disease.  CK was 242 with an MB of 4.2 and an index of 1.7.  Troponins were negative. CBC and CMET were negative, specifically, the glucose was 86.  ASSESSMENT:  History of chest pain and multiple risk factors, very weak history that this episode represented coronary disease but more worried about episodes of neck tightness with activity.  PLAN:  Admit to rule out myocardial infarction, further cardiovascular workup; probably a stress Cardiolite would be helpful.  Hemoglobin A1c. Dictated by:   Erskine Speed, M.D. Attending Physician:  Wilson Singer DD:  05/22/02 TD:  05/23/02 Job: [id number]

## 2011-05-09 ENCOUNTER — Ambulatory Visit (HOSPITAL_COMMUNITY)
Admission: RE | Admit: 2011-05-09 | Discharge: 2011-05-09 | Disposition: A | Discharge status: Home / Self Care | Payer: Medicare Other | Source: Ambulatory Visit | Attending: Internal Medicine | Admitting: Internal Medicine

## 2011-05-09 DIAGNOSIS — N200 Calculus of kidney: Secondary | ICD-10-CM | POA: Insufficient documentation

## 2011-05-09 DIAGNOSIS — J90 Pleural effusion, not elsewhere classified: Secondary | ICD-10-CM | POA: Insufficient documentation

## 2011-05-09 DIAGNOSIS — K7689 Other specified diseases of liver: Secondary | ICD-10-CM | POA: Insufficient documentation

## 2011-05-09 DIAGNOSIS — Q618 Other cystic kidney diseases: Secondary | ICD-10-CM | POA: Insufficient documentation

## 2011-05-09 DIAGNOSIS — J984 Other disorders of lung: Secondary | ICD-10-CM | POA: Insufficient documentation

## 2011-05-09 DIAGNOSIS — K5730 Diverticulosis of large intestine without perforation or abscess without bleeding: Secondary | ICD-10-CM | POA: Insufficient documentation

## 2011-05-09 DIAGNOSIS — C3490 Malignant neoplasm of unspecified part of unspecified bronchus or lung: Secondary | ICD-10-CM

## 2011-05-09 MED ORDER — IOHEXOL 300 MG/ML  SOLN
100.0000 mL | Freq: Once | INTRAMUSCULAR | Status: AC | PRN
  Administered 2011-05-09: 100 mL via INTRAVENOUS

## 2011-05-12 ENCOUNTER — Other Ambulatory Visit: Payer: Self-pay | Admitting: Internal Medicine

## 2011-05-12 ENCOUNTER — Encounter (HOSPITAL_BASED_OUTPATIENT_CLINIC_OR_DEPARTMENT_OTHER): Payer: Medicare Other | Admitting: Internal Medicine

## 2011-05-12 DIAGNOSIS — Z1389 Encounter for screening for other disorder: Secondary | ICD-10-CM

## 2011-05-12 DIAGNOSIS — C3490 Malignant neoplasm of unspecified part of unspecified bronchus or lung: Secondary | ICD-10-CM

## 2011-05-12 DIAGNOSIS — Z7901 Long term (current) use of anticoagulants: Secondary | ICD-10-CM

## 2011-05-12 LAB — CBC WITH DIFFERENTIAL/PLATELET
BASO%: 0.3 % (ref 0.0–2.0)
EOS%: 1.3 % (ref 0.0–7.0)
Eosinophils Absolute: 0.1 10*3/uL (ref 0.0–0.5)
LYMPH%: 20.2 % (ref 14.0–49.0)
MCHC: 33.2 g/dL (ref 32.0–36.0)
MCV: 89.5 fL (ref 79.3–98.0)
MONO%: 8.9 % (ref 0.0–14.0)
NEUT#: 3.7 10*3/uL (ref 1.5–6.5)
Platelets: 310 10*3/uL (ref 140–400)
RBC: 3.73 10*6/uL — ABNORMAL LOW (ref 4.20–5.82)
RDW: 23.5 % — ABNORMAL HIGH (ref 11.0–14.6)

## 2011-05-12 LAB — COMPREHENSIVE METABOLIC PANEL
AST: 13 U/L (ref 0–37)
Albumin: 3.3 g/dL — ABNORMAL LOW (ref 3.5–5.2)
Alkaline Phosphatase: 82 U/L (ref 39–117)
BUN: 9 mg/dL (ref 6–23)
Creatinine, Ser: 0.71 mg/dL (ref 0.40–1.50)
Glucose, Bld: 183 mg/dL — ABNORMAL HIGH (ref 70–99)
Total Bilirubin: 0.2 mg/dL — ABNORMAL LOW (ref 0.3–1.2)

## 2011-05-12 LAB — UA PROTEIN, DIPSTICK - CHCC: Protein, Urine: 30 mg/dL

## 2011-05-12 LAB — LACTATE DEHYDROGENASE: LDH: 179 U/L (ref 94–250)

## 2011-05-12 LAB — PROTIME-INR
INR: 1 — ABNORMAL LOW (ref 2.00–3.50)
Protime: 12 Seconds (ref 10.6–13.4)

## 2011-05-19 ENCOUNTER — Other Ambulatory Visit: Payer: Self-pay | Admitting: Internal Medicine

## 2011-05-19 ENCOUNTER — Encounter (HOSPITAL_BASED_OUTPATIENT_CLINIC_OR_DEPARTMENT_OTHER): Payer: Medicare Other | Admitting: Internal Medicine

## 2011-05-19 DIAGNOSIS — Z5112 Encounter for antineoplastic immunotherapy: Secondary | ICD-10-CM

## 2011-05-19 DIAGNOSIS — C3490 Malignant neoplasm of unspecified part of unspecified bronchus or lung: Secondary | ICD-10-CM

## 2011-05-19 DIAGNOSIS — Z1389 Encounter for screening for other disorder: Secondary | ICD-10-CM

## 2011-05-19 LAB — COMPREHENSIVE METABOLIC PANEL
ALT: 9 U/L (ref 0–53)
CO2: 24 mEq/L (ref 19–32)
Calcium: 9.1 mg/dL (ref 8.4–10.5)
Chloride: 101 mEq/L (ref 96–112)
Creatinine, Ser: 0.75 mg/dL (ref 0.40–1.50)
Glucose, Bld: 146 mg/dL — ABNORMAL HIGH (ref 70–99)
Total Protein: 7.1 g/dL (ref 6.0–8.3)

## 2011-05-19 LAB — UA PROTEIN, DIPSTICK - CHCC: Protein, Urine: <30 mg/dL

## 2011-05-19 LAB — LACTATE DEHYDROGENASE: LDH: 172 U/L (ref 94–250)

## 2011-05-19 LAB — CBC WITH DIFFERENTIAL/PLATELET
Basophils Absolute: 0 10*3/uL (ref 0.0–0.1)
Eosinophils Absolute: 0.2 10*3/uL (ref 0.0–0.5)
HCT: 35.3 % — ABNORMAL LOW (ref 38.4–49.9)
HGB: 11.7 g/dL — ABNORMAL LOW (ref 13.0–17.1)
LYMPH%: 19.6 % (ref 14.0–49.0)
MCV: 90.2 fL (ref 79.3–98.0)
MONO#: 0.6 10*3/uL (ref 0.1–0.9)
NEUT#: 3.7 10*3/uL (ref 1.5–6.5)
Platelets: 404 10*3/uL — ABNORMAL HIGH (ref 140–400)
RBC: 3.92 10*6/uL — ABNORMAL LOW (ref 4.20–5.82)
WBC: 5.7 10*3/uL (ref 4.0–10.3)

## 2011-05-19 LAB — MAGNESIUM: Magnesium: 1.8 mg/dL (ref 1.5–2.5)

## 2011-05-27 ENCOUNTER — Other Ambulatory Visit: Payer: Self-pay | Admitting: Internal Medicine

## 2011-05-27 ENCOUNTER — Encounter (HOSPITAL_BASED_OUTPATIENT_CLINIC_OR_DEPARTMENT_OTHER): Payer: Medicare Other | Admitting: Internal Medicine

## 2011-05-27 DIAGNOSIS — C3410 Malignant neoplasm of upper lobe, unspecified bronchus or lung: Secondary | ICD-10-CM

## 2011-05-27 DIAGNOSIS — D649 Anemia, unspecified: Secondary | ICD-10-CM

## 2011-05-27 DIAGNOSIS — C3490 Malignant neoplasm of unspecified part of unspecified bronchus or lung: Secondary | ICD-10-CM

## 2011-05-27 LAB — CBC WITH DIFFERENTIAL/PLATELET
Basophils Absolute: 0.1 10*3/uL (ref 0.0–0.1)
EOS%: 5.4 % (ref 0.0–7.0)
Eosinophils Absolute: 0.4 10*3/uL (ref 0.0–0.5)
LYMPH%: 22.8 % (ref 14.0–49.0)
MCH: 28.8 pg (ref 27.2–33.4)
MCV: 91.2 fL (ref 79.3–98.0)
MONO%: 9.7 % (ref 0.0–14.0)
Platelets: 308 10*3/uL (ref 140–400)
RBC: 3.96 10*6/uL — ABNORMAL LOW (ref 4.20–5.82)
RDW: 19.4 % — ABNORMAL HIGH (ref 11.0–14.6)

## 2011-05-27 LAB — COMPREHENSIVE METABOLIC PANEL
Alkaline Phosphatase: 68 U/L (ref 39–117)
CO2: 23 mEq/L (ref 19–32)
Creatinine, Ser: 0.94 mg/dL (ref 0.50–1.35)
Glucose, Bld: 143 mg/dL — ABNORMAL HIGH (ref 70–99)
Sodium: 138 mEq/L (ref 135–145)
Total Bilirubin: 0.2 mg/dL — ABNORMAL LOW (ref 0.3–1.2)
Total Protein: 6.4 g/dL (ref 6.0–8.3)

## 2011-05-27 LAB — FECAL OCCULT BLOOD, GUAIAC: Occult Blood: POSITIVE

## 2011-06-09 ENCOUNTER — Other Ambulatory Visit: Payer: Self-pay | Admitting: Internal Medicine

## 2011-06-09 ENCOUNTER — Encounter (HOSPITAL_BASED_OUTPATIENT_CLINIC_OR_DEPARTMENT_OTHER): Payer: Medicare Other | Admitting: Internal Medicine

## 2011-06-09 DIAGNOSIS — C3410 Malignant neoplasm of upper lobe, unspecified bronchus or lung: Secondary | ICD-10-CM

## 2011-06-09 DIAGNOSIS — C3490 Malignant neoplasm of unspecified part of unspecified bronchus or lung: Secondary | ICD-10-CM

## 2011-06-09 DIAGNOSIS — Z1389 Encounter for screening for other disorder: Secondary | ICD-10-CM

## 2011-06-09 LAB — CBC WITH DIFFERENTIAL/PLATELET
Basophils Absolute: 0 10*3/uL (ref 0.0–0.1)
Eosinophils Absolute: 0.4 10*3/uL (ref 0.0–0.5)
HCT: 36.5 % — ABNORMAL LOW (ref 38.4–49.9)
HGB: 12.2 g/dL — ABNORMAL LOW (ref 13.0–17.1)
LYMPH%: 12 % — ABNORMAL LOW (ref 14.0–49.0)
MCV: 92 fL (ref 79.3–98.0)
MONO#: 0.6 10*3/uL (ref 0.1–0.9)
MONO%: 14.3 % — ABNORMAL HIGH (ref 0.0–14.0)
NEUT#: 2.9 10*3/uL (ref 1.5–6.5)
NEUT%: 65.8 % (ref 39.0–75.0)
Platelets: 148 10*3/uL (ref 140–400)
WBC: 4.5 10*3/uL (ref 4.0–10.3)

## 2011-06-09 LAB — COMPREHENSIVE METABOLIC PANEL
AST: 17 U/L (ref 0–37)
Albumin: 3.2 g/dL — ABNORMAL LOW (ref 3.5–5.2)
Alkaline Phosphatase: 74 U/L (ref 39–117)
Potassium: 4.2 mEq/L (ref 3.5–5.3)
Sodium: 132 mEq/L — ABNORMAL LOW (ref 135–145)
Total Protein: 6.7 g/dL (ref 6.0–8.3)

## 2011-06-09 LAB — UA PROTEIN, DIPSTICK - CHCC: Protein, Urine: 100 mg/dL

## 2011-06-23 ENCOUNTER — Other Ambulatory Visit: Payer: Self-pay | Admitting: Internal Medicine

## 2011-06-23 ENCOUNTER — Encounter (HOSPITAL_BASED_OUTPATIENT_CLINIC_OR_DEPARTMENT_OTHER): Payer: Medicare Other | Admitting: Internal Medicine

## 2011-06-23 DIAGNOSIS — C3410 Malignant neoplasm of upper lobe, unspecified bronchus or lung: Secondary | ICD-10-CM

## 2011-06-23 DIAGNOSIS — Z1389 Encounter for screening for other disorder: Secondary | ICD-10-CM

## 2011-06-23 DIAGNOSIS — Z5112 Encounter for antineoplastic immunotherapy: Secondary | ICD-10-CM

## 2011-06-23 DIAGNOSIS — C3490 Malignant neoplasm of unspecified part of unspecified bronchus or lung: Secondary | ICD-10-CM

## 2011-06-23 DIAGNOSIS — D649 Anemia, unspecified: Secondary | ICD-10-CM

## 2011-06-23 LAB — UA PROTEIN, DIPSTICK - CHCC: Protein, Urine: 30 mg/dL

## 2011-06-23 LAB — CBC WITH DIFFERENTIAL/PLATELET
Eosinophils Absolute: 0.6 10*3/uL — ABNORMAL HIGH (ref 0.0–0.5)
HCT: 38.5 % (ref 38.4–49.9)
LYMPH%: 20.2 % (ref 14.0–49.0)
MCHC: 33.6 g/dL (ref 32.0–36.0)
MCV: 91.8 fL (ref 79.3–98.0)
MONO%: 7.8 % (ref 0.0–14.0)
NEUT#: 5.5 10*3/uL (ref 1.5–6.5)
NEUT%: 64.4 % (ref 39.0–75.0)
Platelets: 268 10*3/uL (ref 140–400)
RBC: 4.19 10*6/uL — ABNORMAL LOW (ref 4.20–5.82)

## 2011-06-23 LAB — COMPREHENSIVE METABOLIC PANEL
ALT: 11 U/L (ref 0–53)
BUN: 13 mg/dL (ref 6–23)
CO2: 23 mEq/L (ref 19–32)
Calcium: 9.3 mg/dL (ref 8.4–10.5)
Creatinine, Ser: 0.93 mg/dL (ref 0.50–1.35)
Total Bilirubin: 0.3 mg/dL (ref 0.3–1.2)

## 2011-06-30 ENCOUNTER — Other Ambulatory Visit: Payer: Self-pay | Admitting: Internal Medicine

## 2011-06-30 ENCOUNTER — Encounter (HOSPITAL_BASED_OUTPATIENT_CLINIC_OR_DEPARTMENT_OTHER): Payer: Medicare Other | Admitting: Internal Medicine

## 2011-06-30 DIAGNOSIS — C3490 Malignant neoplasm of unspecified part of unspecified bronchus or lung: Secondary | ICD-10-CM

## 2011-06-30 DIAGNOSIS — D649 Anemia, unspecified: Secondary | ICD-10-CM

## 2011-06-30 DIAGNOSIS — C3410 Malignant neoplasm of upper lobe, unspecified bronchus or lung: Secondary | ICD-10-CM

## 2011-06-30 LAB — CBC WITH DIFFERENTIAL/PLATELET
Basophils Absolute: 0 10*3/uL (ref 0.0–0.1)
Eosinophils Absolute: 0.4 10*3/uL (ref 0.0–0.5)
HCT: 39.6 % (ref 38.4–49.9)
HGB: 12.9 g/dL — ABNORMAL LOW (ref 13.0–17.1)
LYMPH%: 27.8 % (ref 14.0–49.0)
MCHC: 32.6 g/dL (ref 32.0–36.0)
MONO#: 0.6 10*3/uL (ref 0.1–0.9)
NEUT#: 3.9 10*3/uL (ref 1.5–6.5)
NEUT%: 57.7 % (ref 39.0–75.0)
Platelets: 205 10*3/uL (ref 140–400)
WBC: 6.8 10*3/uL (ref 4.0–10.3)
lymph#: 1.9 10*3/uL (ref 0.9–3.3)

## 2011-08-04 ENCOUNTER — Other Ambulatory Visit: Payer: Self-pay | Admitting: Internal Medicine

## 2011-08-04 ENCOUNTER — Ambulatory Visit (HOSPITAL_COMMUNITY)
Admission: RE | Admit: 2011-08-04 | Discharge: 2011-08-04 | Disposition: A | Discharge status: Home / Self Care | Payer: Medicare Other | Source: Ambulatory Visit | Attending: Internal Medicine | Admitting: Internal Medicine

## 2011-08-04 ENCOUNTER — Encounter (HOSPITAL_BASED_OUTPATIENT_CLINIC_OR_DEPARTMENT_OTHER): Payer: Medicare Other | Admitting: Internal Medicine

## 2011-08-04 DIAGNOSIS — N281 Cyst of kidney, acquired: Secondary | ICD-10-CM | POA: Insufficient documentation

## 2011-08-04 DIAGNOSIS — M47817 Spondylosis without myelopathy or radiculopathy, lumbosacral region: Secondary | ICD-10-CM | POA: Insufficient documentation

## 2011-08-04 DIAGNOSIS — J984 Other disorders of lung: Secondary | ICD-10-CM | POA: Insufficient documentation

## 2011-08-04 DIAGNOSIS — J438 Other emphysema: Secondary | ICD-10-CM | POA: Insufficient documentation

## 2011-08-04 DIAGNOSIS — K7689 Other specified diseases of liver: Secondary | ICD-10-CM | POA: Insufficient documentation

## 2011-08-04 DIAGNOSIS — J90 Pleural effusion, not elsewhere classified: Secondary | ICD-10-CM | POA: Insufficient documentation

## 2011-08-04 DIAGNOSIS — I2510 Atherosclerotic heart disease of native coronary artery without angina pectoris: Secondary | ICD-10-CM | POA: Insufficient documentation

## 2011-08-04 DIAGNOSIS — I6789 Other cerebrovascular disease: Secondary | ICD-10-CM | POA: Insufficient documentation

## 2011-08-04 DIAGNOSIS — K5730 Diverticulosis of large intestine without perforation or abscess without bleeding: Secondary | ICD-10-CM | POA: Insufficient documentation

## 2011-08-04 DIAGNOSIS — N200 Calculus of kidney: Secondary | ICD-10-CM | POA: Insufficient documentation

## 2011-08-04 DIAGNOSIS — C3490 Malignant neoplasm of unspecified part of unspecified bronchus or lung: Secondary | ICD-10-CM | POA: Insufficient documentation

## 2011-08-04 DIAGNOSIS — C3410 Malignant neoplasm of upper lobe, unspecified bronchus or lung: Secondary | ICD-10-CM

## 2011-08-04 DIAGNOSIS — N400 Enlarged prostate without lower urinary tract symptoms: Secondary | ICD-10-CM | POA: Insufficient documentation

## 2011-08-04 DIAGNOSIS — R5381 Other malaise: Secondary | ICD-10-CM | POA: Insufficient documentation

## 2011-08-04 DIAGNOSIS — H538 Other visual disturbances: Secondary | ICD-10-CM | POA: Insufficient documentation

## 2011-08-04 DIAGNOSIS — R0789 Other chest pain: Secondary | ICD-10-CM | POA: Insufficient documentation

## 2011-08-04 LAB — CMP (CANCER CENTER ONLY)
ALT(SGPT): 14 U/L (ref 10–47)
AST: 25 U/L (ref 11–38)
CO2: 30 mEq/L (ref 18–33)
Calcium: 9.3 mg/dL (ref 8.0–10.3)
Chloride: 96 mEq/L — ABNORMAL LOW (ref 98–108)
Potassium: 4.4 mEq/L (ref 3.3–4.7)
Sodium: 143 mEq/L (ref 128–145)
Total Protein: 7 g/dL (ref 6.4–8.1)

## 2011-08-04 LAB — CBC WITH DIFFERENTIAL/PLATELET
Eosinophils Absolute: 0.2 10*3/uL (ref 0.0–0.5)
MONO#: 0.5 10*3/uL (ref 0.1–0.9)
NEUT#: 3.4 10*3/uL (ref 1.5–6.5)
Platelets: 205 10*3/uL (ref 140–400)
RBC: 4.36 10*6/uL (ref 4.20–5.82)
RDW: 13.9 % (ref 11.0–14.6)
WBC: 5.6 10*3/uL (ref 4.0–10.3)

## 2011-08-04 MED ORDER — IOHEXOL 300 MG/ML  SOLN
100.0000 mL | Freq: Once | INTRAMUSCULAR | Status: AC | PRN
  Administered 2011-08-04: 100 mL via INTRAVENOUS

## 2011-08-10 ENCOUNTER — Encounter (HOSPITAL_BASED_OUTPATIENT_CLINIC_OR_DEPARTMENT_OTHER): Payer: Medicare Other | Admitting: Internal Medicine

## 2011-08-10 ENCOUNTER — Other Ambulatory Visit: Payer: Self-pay | Admitting: Internal Medicine

## 2011-08-10 DIAGNOSIS — C3410 Malignant neoplasm of upper lobe, unspecified bronchus or lung: Secondary | ICD-10-CM

## 2011-08-10 DIAGNOSIS — C3490 Malignant neoplasm of unspecified part of unspecified bronchus or lung: Secondary | ICD-10-CM

## 2011-10-01 ENCOUNTER — Encounter: Payer: Self-pay | Admitting: *Deleted

## 2011-10-05 ENCOUNTER — Emergency Department (HOSPITAL_COMMUNITY): Payer: Medicare Other

## 2011-10-05 ENCOUNTER — Emergency Department (HOSPITAL_COMMUNITY)
Admission: EM | Admit: 2011-10-05 | Discharge: 2011-10-05 | Disposition: A | Discharge status: Home / Self Care | Payer: Medicare Other | Attending: Emergency Medicine | Admitting: Emergency Medicine

## 2011-10-05 DIAGNOSIS — C3490 Malignant neoplasm of unspecified part of unspecified bronchus or lung: Secondary | ICD-10-CM | POA: Insufficient documentation

## 2011-10-05 DIAGNOSIS — M129 Arthropathy, unspecified: Secondary | ICD-10-CM | POA: Insufficient documentation

## 2011-10-05 DIAGNOSIS — S0990XA Unspecified injury of head, initial encounter: Secondary | ICD-10-CM | POA: Insufficient documentation

## 2011-10-05 DIAGNOSIS — H8109 Meniere's disease, unspecified ear: Secondary | ICD-10-CM | POA: Insufficient documentation

## 2011-10-05 DIAGNOSIS — I252 Old myocardial infarction: Secondary | ICD-10-CM | POA: Insufficient documentation

## 2011-10-05 DIAGNOSIS — Y998 Other external cause status: Secondary | ICD-10-CM | POA: Insufficient documentation

## 2011-10-05 DIAGNOSIS — I2510 Atherosclerotic heart disease of native coronary artery without angina pectoris: Secondary | ICD-10-CM | POA: Insufficient documentation

## 2011-10-05 DIAGNOSIS — I509 Heart failure, unspecified: Secondary | ICD-10-CM | POA: Insufficient documentation

## 2011-10-05 DIAGNOSIS — I69998 Other sequelae following unspecified cerebrovascular disease: Secondary | ICD-10-CM | POA: Insufficient documentation

## 2011-10-05 DIAGNOSIS — E785 Hyperlipidemia, unspecified: Secondary | ICD-10-CM | POA: Insufficient documentation

## 2011-10-05 DIAGNOSIS — S1093XA Contusion of unspecified part of neck, initial encounter: Secondary | ICD-10-CM | POA: Insufficient documentation

## 2011-10-05 DIAGNOSIS — Z8711 Personal history of peptic ulcer disease: Secondary | ICD-10-CM | POA: Insufficient documentation

## 2011-10-05 DIAGNOSIS — W010XXA Fall on same level from slipping, tripping and stumbling without subsequent striking against object, initial encounter: Secondary | ICD-10-CM | POA: Insufficient documentation

## 2011-10-05 DIAGNOSIS — S0003XA Contusion of scalp, initial encounter: Secondary | ICD-10-CM | POA: Insufficient documentation

## 2011-10-05 DIAGNOSIS — Y92009 Unspecified place in unspecified non-institutional (private) residence as the place of occurrence of the external cause: Secondary | ICD-10-CM | POA: Insufficient documentation

## 2011-10-05 DIAGNOSIS — H539 Unspecified visual disturbance: Secondary | ICD-10-CM | POA: Insufficient documentation

## 2011-11-03 ENCOUNTER — Other Ambulatory Visit (HOSPITAL_COMMUNITY): Payer: Medicare Other

## 2011-11-03 ENCOUNTER — Other Ambulatory Visit: Payer: Medicare Other | Admitting: Lab

## 2011-11-04 ENCOUNTER — Encounter: Payer: Self-pay | Admitting: *Deleted

## 2011-11-04 NOTE — Progress Notes (Signed)
Received fax from radiology that pt missed CT scan scheduled 11/15.  Called pt cell ph#, line was disconnected, called pt's home #, pt was not home, asked family member to let him know to call Dr [facility] RN back.  She verbalized understanding.  Need to r/s lab, CT to be done either before f/u 11/21 or if this is not possible will need to have lab/CT and f/u rescheduled.  SLJ

## 2011-11-07 ENCOUNTER — Ambulatory Visit (HOSPITAL_COMMUNITY)
Admission: RE | Admit: 2011-11-07 | Discharge: 2011-11-07 | Disposition: A | Discharge status: Home / Self Care | Payer: Medicare Other | Source: Ambulatory Visit | Attending: Internal Medicine | Admitting: Internal Medicine

## 2011-11-07 ENCOUNTER — Telehealth: Payer: Self-pay | Admitting: *Deleted

## 2011-11-07 ENCOUNTER — Other Ambulatory Visit: Payer: Self-pay | Admitting: Internal Medicine

## 2011-11-07 ENCOUNTER — Ambulatory Visit (HOSPITAL_BASED_OUTPATIENT_CLINIC_OR_DEPARTMENT_OTHER): Payer: Medicare Other

## 2011-11-07 DIAGNOSIS — R599 Enlarged lymph nodes, unspecified: Secondary | ICD-10-CM | POA: Insufficient documentation

## 2011-11-07 DIAGNOSIS — R911 Solitary pulmonary nodule: Secondary | ICD-10-CM | POA: Insufficient documentation

## 2011-11-07 DIAGNOSIS — C3490 Malignant neoplasm of unspecified part of unspecified bronchus or lung: Secondary | ICD-10-CM | POA: Insufficient documentation

## 2011-11-07 DIAGNOSIS — C3410 Malignant neoplasm of upper lobe, unspecified bronchus or lung: Secondary | ICD-10-CM

## 2011-11-07 DIAGNOSIS — J90 Pleural effusion, not elsewhere classified: Secondary | ICD-10-CM | POA: Insufficient documentation

## 2011-11-07 LAB — CMP (CANCER CENTER ONLY)
ALT(SGPT): 26 U/L (ref 10–47)
AST: 21 U/L (ref 11–38)
Alkaline Phosphatase: 100 U/L — ABNORMAL HIGH (ref 26–84)
BUN, Bld: 11 mg/dL (ref 7–22)
Calcium: 9.4 mg/dL (ref 8.0–10.3)
Chloride: 97 mEq/L — ABNORMAL LOW (ref 98–108)
Creat: 0.9 mg/dl (ref 0.6–1.2)
Total Bilirubin: 0.5 mg/dl (ref 0.20–1.60)

## 2011-11-07 LAB — CBC WITH DIFFERENTIAL/PLATELET
BASO%: 0.5 % (ref 0.0–2.0)
EOS%: 3.4 % (ref 0.0–7.0)
HCT: 43.5 % (ref 38.4–49.9)
MCH: 28.8 pg (ref 27.2–33.4)
MCHC: 32 g/dL (ref 32.0–36.0)
MCV: 90 fL (ref 79.3–98.0)
MONO%: 11.1 % (ref 0.0–14.0)
NEUT%: 64.9 % (ref 39.0–75.0)
lymph#: 1.2 10*3/uL (ref 0.9–3.3)

## 2011-11-07 MED ORDER — IOHEXOL 300 MG/ML  SOLN
100.0000 mL | Freq: Once | INTRAMUSCULAR | Status: AC | PRN
  Administered 2011-11-07: 100 mL via INTRAVENOUS

## 2011-11-09 ENCOUNTER — Telehealth: Payer: Self-pay | Admitting: Internal Medicine

## 2011-11-09 ENCOUNTER — Ambulatory Visit (HOSPITAL_BASED_OUTPATIENT_CLINIC_OR_DEPARTMENT_OTHER): Payer: Medicare Other | Admitting: Internal Medicine

## 2011-11-09 VITALS — BP 157/94 | HR 113 | Temp 98.4°F | Ht 66.0 in | Wt 190.4 lb

## 2011-11-09 DIAGNOSIS — C3490 Malignant neoplasm of unspecified part of unspecified bronchus or lung: Secondary | ICD-10-CM

## 2011-11-09 DIAGNOSIS — C3410 Malignant neoplasm of upper lobe, unspecified bronchus or lung: Secondary | ICD-10-CM

## 2011-11-09 MED ORDER — PROCHLORPERAZINE MALEATE 10 MG PO TABS: 10.0000 mg | ORAL_TABLET | Freq: Four times a day (QID) | ORAL | Status: DC | PRN

## 2011-11-09 MED ORDER — CYANOCOBALAMIN 1000 MCG/ML IJ SOLN
1000.0000 ug | Freq: Once | INTRAMUSCULAR | Status: AC
  Administered 2011-11-09: 1000 ug via INTRAMUSCULAR

## 2011-11-09 MED ORDER — OXYCODONE-ACETAMINOPHEN 5-325 MG PO TABS: 1.0000 | ORAL_TABLET | Freq: Four times a day (QID) | ORAL | Status: DC | PRN

## 2011-11-09 MED ORDER — FOLIC ACID 1 MG PO TABS: 1.0000 mg | ORAL_TABLET | Freq: Every day | ORAL | Status: DC

## 2011-11-09 MED ORDER — DEXAMETHASONE 4 MG PO TABS: 4.0000 mg | ORAL_TABLET | ORAL | Status: AC

## 2011-11-09 NOTE — Progress Notes (Signed)
[facility] OFFICE PROGRESS NOTE  DIAGNOSIS: Metastatic non-small cell lung cancer, adenocarcinoma, diagnosed in June 2012.  PRIOR THERAPY: :   1. Status post left Pleurx catheter placement by Dr. Edwyna Shell on February 08, 2011 for drainage of left sided pleural effusion. 2. Status post 4 cycles of induction chemotherapy with carboplatin for AUC of 6, paclitaxel 200 mg per meter square and Avastin 15 mg/kg given every 3 weeks according to the ECOG protocol 5508 with stable disease. 3. Status post 1 cycle of maintenance Avastin 15 mg/kg given on May 19, 2011, discontinued secondary to gastrointestinal bleed.  CURRENT THERAPY: Observation  INTERVAL HISTORY: Johnathan Bowman 77 y.o. male returns to the clinic today for followup visit accompanied by his daughter. The patient has been on observation since May of 2012. It has been complaining of increasing pain in the left side of his chest recently as well as shortness breath with exertion. He has not been on any pain medication and using only Tylenol as needed basis. He has a repeat CT scan of the chest, abdomen and pelvis performed on 11/07/2011 and he is here today for evaluation and discussion of his scan results. He denied having any significant weight loss or night sweats, no nausea or vomiting, no cough or hemoptysis.   MEDICAL HISTORY: Past Medical History  Diagnosis Date  . Hypertension   . Diabetes mellitus   . Dyslipidemia   . lung ca 11/12/2010  . Mini stroke   . Degenerative disc disease   . GERD (gastroesophageal reflux disease)   . Osteopenia 2005  . Coronary artery disease 2005    treated medically  . BPH (benign prostatic hyperplasia)     ALLERGIES:  is allergic to penicillins.  MEDICATIONS:  Current Outpatient Prescriptions  Medication Sig Dispense Refill  . amLODipine-benazepril (LOTREL) 5-10 MG per capsule Take 1 capsule by mouth daily.        Marland Kitchen aspirin 81 MG tablet Take 81 mg by mouth daily. Patient  states not taking regularly.       . glyBURIDE-metformin (GLUCOVANCE) 5-500 MG per tablet Take 2 tablets by mouth 2 (two) times daily.        Marland Kitchen HYDROcodone-acetaminophen (VICODIN) 5-500 MG per tablet Take 1 tablet by mouth every 6 (six) hours as needed. Refilled 06/23/11 for #40 - 0 refills       . lidocaine-prilocaine (EMLA) cream Apply 1 application topically as needed. Apply to [facility] A Cath site before chemotherapy.       . meclizine (ANTIVERT) 25 MG tablet Take 25 mg by mouth every 8 (eight) hours as needed.        . Multiple Vitamin (MULTIVITAMIN PO) Take 1 tablet by mouth daily.        Marland Kitchen oxyCODONE-acetaminophen (PERCOCET) 5-325 MG per tablet Take 1 tablet by mouth every 6 (six) hours as needed. Refilled 08/10/11 per Dr. Arbutus Ped  60 tablet  0  . pantoprazole (PROTONIX) 40 MG tablet Take 40 mg by mouth daily.        . fluticasone (FLONASE) 50 MCG/ACT nasal spray Place 2 sprays into the nose daily as needed.        . gabapentin (NEURONTIN) 100 MG capsule Take 100 mg by mouth 3 (three) times daily. Refilled 08/10/11 per Dr. Arbutus Ped #90-2 refills.       . prochlorperazine (COMPAZINE) 10 MG tablet Take 10 mg by mouth every 6 (six) hours as needed. For nausea/vomiting       . valACYclovir (  VALTREX) 1000 MG tablet Take 1,000 mg by mouth 2 (two) times daily. Dose increased by Arbutus Ped on 06/09/11 x 12 days total.        Current Facility-Administered Medications  Medication Dose Route Frequency Provider Last Rate Last Dose  . cyanocobalamin ((VITAMIN B-12)) injection 1,000 mcg  1,000 mcg Intramuscular Once Jasmin Trumbull K. Arbutus Ped, MD   1,000 mcg at 11/09/11 1226    REVIEW OF SYSTEMS:  A comprehensive review of systems was negative except for: Respiratory: positive for dyspnea on exertion and pleurisy/chest pain   PHYSICAL EXAMINATION: General appearance: alert, cooperative and no distress Head: Normocephalic, without obvious abnormality, atraumatic Neck: no adenopathy Lymph nodes: Cervical,  supraclavicular, and axillary nodes normal. Resp: clear to auscultation bilaterally Cardio: regular rate and rhythm, S1, S2 normal, no murmur, click, rub or gallop GI: soft, non-tender; bowel sounds normal; no masses,  no organomegaly Extremities: extremities normal, atraumatic, no cyanosis or edema Neurologic: Alert and oriented X 3, normal strength and tone. Normal symmetric reflexes. Normal coordination and gait  ECOG PERFORMANCE STATUS: 1 - Symptomatic but completely ambulatory  Blood pressure 157/94, pulse 113, temperature 98.4 F (36.9 C), height 5\' 6"  (1.676 m), weight 190 lb 6.4 oz (86.365 kg).  LABORATORY DATA: Lab Results  Component Value Date   WBC 6.1 11/07/2011   HGB 13.9 11/07/2011   HCT 43.5 11/07/2011   MCV 90.0 11/07/2011   PLT 261 11/07/2011      Chemistry      Component Value Date/Time   NA 139 11/07/2011 1621   NA 139 06/23/2011 1406   K 4.8* 11/07/2011 1621   K 4.0 06/23/2011 1406   CL 97* 11/07/2011 1621   CL 104 06/23/2011 1406   CO2 30 11/07/2011 1621   CO2 23 06/23/2011 1406   BUN 11 11/07/2011 1621   BUN 13 06/23/2011 1406   CREATININE 0.9 11/07/2011 1621   CREATININE 0.93 06/23/2011 1406      Component Value Date/Time   CALCIUM 9.4 11/07/2011 1621   CALCIUM 9.3 06/23/2011 1406   ALKPHOS 100* 11/07/2011 1621   ALKPHOS 66 06/23/2011 1406   AST 21 11/07/2011 1621   AST 17 06/23/2011 1406   ALT 11 06/23/2011 1406   BILITOT 0.50 11/07/2011 1621   BILITOT 0.3 06/23/2011 1406       RADIOGRAPHIC STUDIES: Ct Chest W Contrast  11/08/2011  *RADIOLOGY REPORT*  Clinical Data:  Lung cancer.  Resist protocol.  Chemotherapy ongoing  CT CHEST, ABDOMEN AND PELVIS WITH CONTRAST  Technique:  Multidetector CT imaging of the chest, abdomen and pelvis was performed following the standard protocol during bolus administration of intravenous contrast.  Contrast: OMNIPAQUE IOHEXOL 300 MG/ML IV SOLN  Comparison:  CT 08/04/2011  Resist protocol version  1.1.  Target lesion: 1.   Left upper lobe nodule measures 18  mm in greatest dimension (image 24) increased from 13 mm on prior.  Non target lesion:  1.  Left pleural effusion is increased. 2.  Subpleural nodules in the left upper lobe are present.  CT CHEST  Findings:  There is a port in the right anterior chest wall.  No axillary or supraclavicular lymphadenopathy. There is interval increase in size of and AP window lymph node measuring 12 mm short axis (image 20) compared to 8 mm on prior.  Small prevascular lymph node measuring 8 mm (image 25) is increased from 5 mm on prior. Small 6 mm right paratracheal lymph node ( image #9) increased from 4 mm on prior.  Within the left hemithorax, there is an increase in loculated pleural fluid.  There is increased nodularity along the pleural surface.  For example 10 x 18 nodular thickening along the left mediastinal border (image 24) is increased from 12 by 4 mm on prior.  There are very small subpleural nodules  which are more prominent than prior.  These are less than 5 mm each.  The nodularity along the superior left pericardium (image 27 ) is also increased.  No new nodular in the right lung.  IMPRESSION:  1.  Evidence for disease progression in the left hemithorax with increased pleural nodularity. 2.  Interval increase in left effusion. 3.  Interval increase in mediastinal lymphadenopathy consistent with metastasis.  CT ABDOMEN AND PELVIS  Findings:  No new hepatic lesions present.  Low density lesions in the left lateral hepatic lobe are unchanged.  The gallbladder, pancreas, spleen, adrenal glands are normal.  Stable low density lesions within the kidneys, the larger of which have simple fluid attenuation.  There is coarse calcification in the lower pole right kidney.  The stomach, small bowel, cecum are normal.  There are diverticula of the colon.  Abdominal aorta is normal caliber.  No retroperitoneal lymphadenopathy.  No free fluid the pelvis.  The bladder is normal.  The seminal  vesicles are prominent but unchanged.  Prostate gland appears normal.  No pelvic lymphadenopathy. Review of  bone windows demonstrates no aggressive osseous lesions.  IMPRESSION: 1.  Stable exam of the abdomen and pelvis. No evidence of metastasis in the abdomen  or pelvis.  2.  Please see above section for disease progression in the chest.  Original Report Authenticated By: Genevive Bi, M.D.   Ct Abdomen Pelvis W Contrast  11/08/2011  *RADIOLOGY REPORT*  Clinical Data:  Lung cancer.  Resist protocol.  Chemotherapy ongoing  CT CHEST, ABDOMEN AND PELVIS WITH CONTRAST  Technique:  Multidetector CT imaging of the chest, abdomen and pelvis was performed following the standard protocol during bolus administration of intravenous contrast.  Contrast: OMNIPAQUE IOHEXOL 300 MG/ML IV SOLN  Comparison:  CT 08/04/2011  Resist protocol version  1.1.  Target lesion: 1.  Left upper lobe nodule measures 18  mm in greatest dimension (image 24) increased from 13 mm on prior.  Non target lesion:  1.  Left pleural effusion is increased. 2.  Subpleural nodules in the left upper lobe are present.  CT CHEST  Findings:  There is a port in the right anterior chest wall.  No axillary or supraclavicular lymphadenopathy. There is interval increase in size of and AP window lymph node measuring 12 mm short axis (image 20) compared to 8 mm on prior.  Small prevascular lymph node measuring 8 mm (image 25) is increased from 5 mm on prior. Small 6 mm right paratracheal lymph node ( image #9) increased from 4 mm on prior.  Within the left hemithorax, there is an increase in loculated pleural fluid.  There is increased nodularity along the pleural surface.  For example 10 x 18 nodular thickening along the left mediastinal border (image 24) is increased from 12 by 4 mm on prior.  There are very small subpleural nodules  which are more prominent than prior.  These are less than 5 mm each.  The nodularity along the superior left pericardium  (image 27 ) is also increased.  No new nodular in the right lung.  IMPRESSION:  1.  Evidence for disease progression in the left hemithorax with increased pleural  nodularity. 2.  Interval increase in left effusion. 3.  Interval increase in mediastinal lymphadenopathy consistent with metastasis.  CT ABDOMEN AND PELVIS  Findings:  No new hepatic lesions present.  Low density lesions in the left lateral hepatic lobe are unchanged.  The gallbladder, pancreas, spleen, adrenal glands are normal.  Stable low density lesions within the kidneys, the larger of which have simple fluid attenuation.  There is coarse calcification in the lower pole right kidney.  The stomach, small bowel, cecum are normal.  There are diverticula of the colon.  Abdominal aorta is normal caliber.  No retroperitoneal lymphadenopathy.  No free fluid the pelvis.  The bladder is normal.  The seminal vesicles are prominent but unchanged.  Prostate gland appears normal.  No pelvic lymphadenopathy. Review of  bone windows demonstrates no aggressive osseous lesions.  IMPRESSION: 1.  Stable exam of the abdomen and pelvis. No evidence of metastasis in the abdomen  or pelvis.  2.  Please see above section for disease progression in the chest.  Original Report Authenticated By: Genevive Bi, M.D.    ASSESSMENT: This is a very pleasant 77 years old African American male with metastatic non-small cell lung cancer, adenocarcinoma status post treatment was carboplatin, paclitaxel and Avastin, followed by 1 cycle of maintenance Avastin which was discontinued secondary to gastrointestinal bleed. The patient has some evidence for disease progression in the left hemithorax. I discussed the scan results with the patient.  PLAN:  #1 I discussed with the patient treatment options including treatment with single agent Alimta 500 mg/M2 every 2 weeks. I discussed with the patient adverse effect of this treatment including but not limited to alopecia,  myelosuppression, nausea and vomiting, liver or renal dysfunction. The patient agreed to the current plan. He was given prescription today for folic acid 1 mg by mouth daily, Compazine 10 mg by mouth every 6 hours as needed for nausea and Decadron 4 mg by mouth twice a day the day before, the day of and day after his chemotherapy. He also receive vitamin B12 injection today. #2 for pain management I gave the patient a prescription for Percocet 1 tablet by mouth every 6 hours as needed for pain. #3 The patient expected to start the first cycle of this treatment next week and he would come back for followup visit in 4 weeks with the start of cycle #2.   All questions were answered. The patient knows to call the clinic with any problems, questions or concerns. We can certainly see the patient much sooner if necessary.  I spent 25 minutes counseling the patient face to face. The total time spent in the appointment was 40 minutes.

## 2011-11-09 NOTE — Telephone Encounter (Signed)
gv pt/dtr appt schedule for nov/dec.

## 2011-11-16 ENCOUNTER — Other Ambulatory Visit (HOSPITAL_BASED_OUTPATIENT_CLINIC_OR_DEPARTMENT_OTHER): Payer: Medicare Other | Admitting: Lab

## 2011-11-16 ENCOUNTER — Ambulatory Visit (HOSPITAL_BASED_OUTPATIENT_CLINIC_OR_DEPARTMENT_OTHER): Payer: Medicare Other

## 2011-11-16 DIAGNOSIS — C3410 Malignant neoplasm of upper lobe, unspecified bronchus or lung: Secondary | ICD-10-CM

## 2011-11-16 DIAGNOSIS — C3490 Malignant neoplasm of unspecified part of unspecified bronchus or lung: Secondary | ICD-10-CM

## 2011-11-16 DIAGNOSIS — Z5111 Encounter for antineoplastic chemotherapy: Secondary | ICD-10-CM

## 2011-11-16 LAB — COMPREHENSIVE METABOLIC PANEL
ALT: 13 U/L (ref 0–53)
AST: 12 U/L (ref 0–37)
Alkaline Phosphatase: 84 U/L (ref 39–117)
BUN: 14 mg/dL (ref 6–23)
Calcium: 9.9 mg/dL (ref 8.4–10.5)
Chloride: 100 mEq/L (ref 96–112)
Creatinine, Ser: 0.92 mg/dL (ref 0.50–1.35)
Total Bilirubin: 0.3 mg/dL (ref 0.3–1.2)

## 2011-11-16 LAB — CBC WITH DIFFERENTIAL/PLATELET
Basophils Absolute: 0 10*3/uL (ref 0.0–0.1)
EOS%: 0 % (ref 0.0–7.0)
HCT: 41.2 % (ref 38.4–49.9)
HGB: 13.4 g/dL (ref 13.0–17.1)
LYMPH%: 5.7 % — ABNORMAL LOW (ref 14.0–49.0)
MCH: 28.9 pg (ref 27.2–33.4)
MCHC: 32.5 g/dL (ref 32.0–36.0)
MONO#: 0.7 10*3/uL (ref 0.1–0.9)
NEUT%: 88.5 % — ABNORMAL HIGH (ref 39.0–75.0)
Platelets: 281 10*3/uL (ref 140–400)
lymph#: 0.7 10*3/uL — ABNORMAL LOW (ref 0.9–3.3)

## 2011-11-16 MED ORDER — SODIUM CHLORIDE 0.9 % IV SOLN
500.0000 mg/m2 | Freq: Once | INTRAVENOUS | Status: AC
  Administered 2011-11-16: 1000 mg via INTRAVENOUS
  Filled 2011-11-16: qty 40

## 2011-11-16 MED ORDER — SODIUM CHLORIDE 0.9 % IJ SOLN
10.0000 mL | INTRAMUSCULAR | Status: DC | PRN
  Administered 2011-11-16: 10 mL
  Filled 2011-11-16: qty 10

## 2011-11-16 MED ORDER — HEPARIN SOD (PORK) LOCK FLUSH 100 UNIT/ML IV SOLN
500.0000 [IU] | Freq: Once | INTRAVENOUS | Status: AC | PRN
  Administered 2011-11-16: 500 [IU]
  Filled 2011-11-16: qty 5

## 2011-11-16 MED ORDER — ONDANSETRON 8 MG/50ML IVPB (CHCC)
8.0000 mg | Freq: Once | INTRAVENOUS | Status: AC
  Administered 2011-11-16: 8 mg via INTRAVENOUS

## 2011-11-16 MED ORDER — SODIUM CHLORIDE 0.9 % IV SOLN
Freq: Once | INTRAVENOUS | Status: AC
  Administered 2011-11-16: 14:00:00 via INTRAVENOUS

## 2011-11-16 MED ORDER — DEXAMETHASONE SODIUM PHOSPHATE 10 MG/ML IJ SOLN
10.0000 mg | Freq: Once | INTRAMUSCULAR | Status: AC
  Administered 2011-11-16: 10 mg via INTRAVENOUS

## 2011-12-07 ENCOUNTER — Other Ambulatory Visit (HOSPITAL_BASED_OUTPATIENT_CLINIC_OR_DEPARTMENT_OTHER): Payer: Medicare Other | Admitting: Lab

## 2011-12-07 ENCOUNTER — Ambulatory Visit (HOSPITAL_BASED_OUTPATIENT_CLINIC_OR_DEPARTMENT_OTHER): Payer: Medicare Other | Admitting: Physician Assistant

## 2011-12-07 ENCOUNTER — Encounter: Payer: Self-pay | Admitting: Physician Assistant

## 2011-12-07 ENCOUNTER — Ambulatory Visit (HOSPITAL_BASED_OUTPATIENT_CLINIC_OR_DEPARTMENT_OTHER): Payer: Medicare Other

## 2011-12-07 ENCOUNTER — Other Ambulatory Visit: Payer: Self-pay | Admitting: Internal Medicine

## 2011-12-07 DIAGNOSIS — K922 Gastrointestinal hemorrhage, unspecified: Secondary | ICD-10-CM

## 2011-12-07 DIAGNOSIS — C3490 Malignant neoplasm of unspecified part of unspecified bronchus or lung: Secondary | ICD-10-CM

## 2011-12-07 DIAGNOSIS — Z5111 Encounter for antineoplastic chemotherapy: Secondary | ICD-10-CM

## 2011-12-07 DIAGNOSIS — C3410 Malignant neoplasm of upper lobe, unspecified bronchus or lung: Secondary | ICD-10-CM

## 2011-12-07 DIAGNOSIS — K219 Gastro-esophageal reflux disease without esophagitis: Secondary | ICD-10-CM

## 2011-12-07 LAB — CBC WITH DIFFERENTIAL/PLATELET
Eosinophils Absolute: 0.1 10*3/uL (ref 0.0–0.5)
LYMPH%: 13.7 % — ABNORMAL LOW (ref 14.0–49.0)
MCV: 89 fL (ref 79.3–98.0)
MONO%: 8.9 % (ref 0.0–14.0)
NEUT#: 6.4 10*3/uL (ref 1.5–6.5)
NEUT%: 76.2 % — ABNORMAL HIGH (ref 39.0–75.0)
Platelets: 199 10*3/uL (ref 140–400)
RBC: 5.16 10*6/uL (ref 4.20–5.82)
nRBC: 0 % (ref 0–0)

## 2011-12-07 LAB — COMPREHENSIVE METABOLIC PANEL
ALT: 26 U/L (ref 0–53)
Alkaline Phosphatase: 56 U/L (ref 39–117)
Glucose, Bld: 262 mg/dL — ABNORMAL HIGH (ref 70–99)
Sodium: 134 mEq/L — ABNORMAL LOW (ref 135–145)
Total Bilirubin: 0.4 mg/dL (ref 0.3–1.2)
Total Protein: 6.1 g/dL (ref 6.0–8.3)

## 2011-12-07 MED ORDER — ONDANSETRON 8 MG/50ML IVPB (CHCC)
8.0000 mg | Freq: Once | INTRAVENOUS | Status: AC
  Administered 2011-12-07: 8 mg via INTRAVENOUS

## 2011-12-07 MED ORDER — SODIUM CHLORIDE 0.9 % IV SOLN
Freq: Once | INTRAVENOUS | Status: AC
  Administered 2011-12-07: 13:00:00 via INTRAVENOUS

## 2011-12-07 MED ORDER — HEPARIN SOD (PORK) LOCK FLUSH 100 UNIT/ML IV SOLN
500.0000 [IU] | Freq: Once | INTRAVENOUS | Status: AC | PRN
  Administered 2011-12-07: 500 [IU]
  Filled 2011-12-07: qty 5

## 2011-12-07 MED ORDER — DEXAMETHASONE SODIUM PHOSPHATE 10 MG/ML IJ SOLN
10.0000 mg | Freq: Once | INTRAMUSCULAR | Status: AC
  Administered 2011-12-07: 10 mg via INTRAVENOUS

## 2011-12-07 MED ORDER — PEMETREXED DISODIUM CHEMO INJECTION 500 MG
500.0000 mg/m2 | Freq: Once | INTRAVENOUS | Status: AC
  Administered 2011-12-07: 1000 mg via INTRAVENOUS
  Filled 2011-12-07: qty 40

## 2011-12-07 MED ORDER — PANTOPRAZOLE SODIUM 40 MG PO TBEC: 40.0000 mg | DELAYED_RELEASE_TABLET | Freq: Every day | ORAL | Status: DC

## 2011-12-07 MED ORDER — SODIUM CHLORIDE 0.9 % IJ SOLN
10.0000 mL | INTRAMUSCULAR | Status: DC | PRN
  Administered 2011-12-07: 10 mL
  Filled 2011-12-07: qty 10

## 2011-12-07 NOTE — Progress Notes (Signed)
Johnathan Bowman OFFICE PROGRESS NOTE  DIAGNOSIS: Metastatic non-small cell lung cancer, adenocarcinoma, diagnosed in June 2012.  PRIOR THERAPY: :   1. Status post left Pleurx catheter placement by Dr. Edwyna Shell on February 08, 2011 for drainage of left sided pleural effusion. 2. Status post 4 cycles of induction chemotherapy with carboplatin for AUC of 6, paclitaxel 200 mg per meter square and Avastin 15 mg/kg given every 3 weeks according to the ECOG protocol 5508 with stable disease. 3. Status post 1 cycle of maintenance Avastin 15 mg/kg given on May 19, 2011, discontinued secondary to gastrointestinal bleed.  CURRENT THERAPY: Alimta 500 mg/m2 every 3 weeks. Status post 1 cycle  INTERVAL HISTORY: Johnathan Bowman 77 y.o. male returns to the clinic today for followup visit. He tolerated his first cycle of Alimta without difficulty. He requests a refill for his Bertin X. He does note that his gait seems to be "off balance". He states that he tends toway "sway" he walks. He denied any falls or near falls. He's had no headaches or double vision. He's had a couple of brief episodes, lasting less than a minute, of blurred vision but went away after he removed his glasses and rub his eyes. He denied having any significant weight loss or night sweats, no nausea or vomiting, no cough or hemoptysis.   MEDICAL HISTORY: Past Medical History  Diagnosis Date  . Hypertension   . Diabetes mellitus   . Dyslipidemia   . lung ca 11/12/2010  . Mini stroke   . Degenerative disc disease   . GERD (gastroesophageal reflux disease)   . Osteopenia 2005  . Coronary artery disease 2005    treated medically  . BPH (benign prostatic hyperplasia)     ALLERGIES:  is allergic to penicillins.  MEDICATIONS:  Current Outpatient Prescriptions  Medication Sig Dispense Refill  . amLODipine-benazepril (LOTREL) 5-10 MG per capsule Take 1 capsule by mouth daily.        Marland Kitchen aspirin 81 MG tablet Take 81 mg by mouth  daily. Patient states not taking regularly.       Marland Kitchen dexamethasone (DECADRON) 4 MG tablet Take 4 mg by mouth 2 (two) times daily with a meal. 1 tab po bid, the day before, the day of, and the day after chemotherapy       . folic acid (FOLVITE) 1 MG tablet Take 1 tablet (1 mg total) by mouth daily.  30 tablet  5  . glyBURIDE-metformin (GLUCOVANCE) 5-500 MG per tablet Take 2 tablets by mouth 2 (two) times daily.        Marland Kitchen lidocaine-prilocaine (EMLA) cream Apply 1 application topically as needed. Apply to [facility] Cath site before chemotherapy.       . Multiple Vitamin (MULTIVITAMIN PO) Take 1 tablet by mouth daily.        Marland Kitchen oxyCODONE-acetaminophen (PERCOCET) 5-325 MG per tablet Take 1 tablet by mouth every 6 (six) hours as needed. Refilled 08/10/11 per Dr. Arbutus Ped  60 tablet  0  . pantoprazole (PROTONIX) 40 MG tablet Take 1 tablet (40 mg total) by mouth daily.  30 tablet  3  . prochlorperazine (COMPAZINE) 10 MG tablet Take 1 tablet (10 mg total) by mouth every 6 (six) hours as needed. For nausea/vomiting  30 tablet  1  . fluticasone (FLONASE) 50 MCG/ACT nasal spray Place 2 sprays into the nose daily as needed.        . gabapentin (NEURONTIN) 100 MG capsule Take 100 mg by mouth  3 (three) times daily. Refilled 08/10/11 per Dr. Arbutus Ped #90-2 refills.       Marland Kitchen HYDROcodone-acetaminophen (VICODIN) 5-500 MG per tablet Take 1 tablet by mouth every 6 (six) hours as needed. Refilled 06/23/11 for #40 - 0 refills       . meclizine (ANTIVERT) 25 MG tablet Take 25 mg by mouth every 8 (eight) hours as needed.        . valACYclovir (VALTREX) 1000 MG tablet Take 1,000 mg by mouth 2 (two) times daily. Dose increased by Arbutus Ped on 06/09/11 x 12 days total.         REVIEW OF SYSTEMS:  A comprehensive review of systems was negative except for: Respiratory: positive for dyspnea on exertion Sensation of feeling off balance somewhat when he walks.   PHYSICAL EXAMINATION: General appearance: alert, cooperative and no  distress Head: Normocephalic, without obvious abnormality, atraumatic Neck: no adenopathy Lymph nodes: Cervical, supraclavicular, and axillary nodes normal. Resp: clear to auscultation bilaterally Cardio: regular rate and rhythm, S1, S2 normal, no murmur, click, rub or gallop GI: soft, non-tender; bowel sounds normal; no masses,  no organomegaly Extremities: extremities normal, atraumatic, no cyanosis or edema Neurologic: Alert and oriented X 3, normal strength and tone. Normal symmetric reflexes. Normal coordination and gait  ECOG PERFORMANCE STATUS: 1 - Symptomatic but completely ambulatory  Blood pressure 139/84, pulse 111, temperature 97.3 F (36.3 C), temperature source Oral, height 5\' 6"  (1.676 m), weight 183 lb 3.2 oz (83.099 kg).  LABORATORY DATA: Lab Results  Component Value Date   WBC 8.4 12/07/2011   HGB 14.5 12/07/2011   HCT 45.9 12/07/2011   MCV 89.0 12/07/2011   PLT 199 12/07/2011      Chemistry      Component Value Date/Time   NA 137 11/16/2011 1048   NA 139 11/07/2011 1621   K 4.4 11/16/2011 1048   K 4.8* 11/07/2011 1621   CL 100 11/16/2011 1048   CL 97* 11/07/2011 1621   CO2 28 11/16/2011 1048   CO2 30 11/07/2011 1621   BUN 14 11/16/2011 1048   BUN 11 11/07/2011 1621   CREATININE 0.92 11/16/2011 1048   CREATININE 0.9 11/07/2011 1621      Component Value Date/Time   CALCIUM 9.9 11/16/2011 1048   CALCIUM 9.4 11/07/2011 1621   ALKPHOS 84 11/16/2011 1048   ALKPHOS 100* 11/07/2011 1621   AST 12 11/16/2011 1048   AST 21 11/07/2011 1621   ALT 13 11/16/2011 1048   BILITOT 0.3 11/16/2011 1048   BILITOT 0.50 11/07/2011 1621       RADIOGRAPHIC STUDIES: Ct Chest W Contrast  11/08/2011  *RADIOLOGY REPORT*  Clinical Data:  Lung cancer.  Resist protocol.  Chemotherapy ongoing  CT CHEST, ABDOMEN AND PELVIS WITH CONTRAST  Technique:  Multidetector CT imaging of the chest, abdomen and pelvis was performed following the standard protocol during bolus  administration of intravenous contrast.  Contrast: OMNIPAQUE IOHEXOL 300 MG/ML IV SOLN  Comparison:  CT 08/04/2011  Resist protocol version  1.1.  Target lesion: 1.  Left upper lobe nodule measures 18  mm in greatest dimension (image 24) increased from 13 mm on prior.  Non target lesion:  1.  Left pleural effusion is increased. 2.  Subpleural nodules in the left upper lobe are present.  CT CHEST  Findings:  There is a port in the right anterior chest wall.  No axillary or supraclavicular lymphadenopathy. There is interval increase in size of and AP window lymph node measuring 12  mm short axis (image 20) compared to 8 mm on prior.  Small prevascular lymph node measuring 8 mm (image 25) is increased from 5 mm on prior. Small 6 mm right paratracheal lymph node ( image #9) increased from 4 mm on prior.  Within the left hemithorax, there is an increase in loculated pleural fluid.  There is increased nodularity along the pleural surface.  For example 10 x 18 nodular thickening along the left mediastinal border (image 24) is increased from 12 by 4 mm on prior.  There are very small subpleural nodules  which are more prominent than prior.  These are less than 5 mm each.  The nodularity along the superior left pericardium (image 27 ) is also increased.  No new nodular in the right lung.  IMPRESSION:  1.  Evidence for disease progression in the left hemithorax with increased pleural nodularity. 2.  Interval increase in left effusion. 3.  Interval increase in mediastinal lymphadenopathy consistent with metastasis.  CT ABDOMEN AND PELVIS  Findings:  No new hepatic lesions present.  Low density lesions in the left lateral hepatic lobe are unchanged.  The gallbladder, pancreas, spleen, adrenal glands are normal.  Stable low density lesions within the kidneys, the larger of which have simple fluid attenuation.  There is coarse calcification in the lower pole right kidney.  The stomach, small bowel, cecum are normal.  There  are diverticula of the colon.  Abdominal aorta is normal caliber.  No retroperitoneal lymphadenopathy.  No free fluid the pelvis.  The bladder is normal.  The seminal vesicles are prominent but unchanged.  Prostate gland appears normal.  No pelvic lymphadenopathy. Review of  bone windows demonstrates no aggressive osseous lesions.  IMPRESSION: 1.  Stable exam of the abdomen and pelvis. No evidence of metastasis in the abdomen  or pelvis.  2.  Please see above section for disease progression in the chest.  Original Report Authenticated By: Genevive Bi, M.D.   Ct Abdomen Pelvis W Contrast  11/08/2011  *RADIOLOGY REPORT*  Clinical Data:  Lung cancer.  Resist protocol.  Chemotherapy ongoing  CT CHEST, ABDOMEN AND PELVIS WITH CONTRAST  Technique:  Multidetector CT imaging of the chest, abdomen and pelvis was performed following the standard protocol during bolus administration of intravenous contrast.  Contrast: OMNIPAQUE IOHEXOL 300 MG/ML IV SOLN  Comparison:  CT 08/04/2011  Resist protocol version  1.1.  Target lesion: 1.  Left upper lobe nodule measures 18  mm in greatest dimension (image 24) increased from 13 mm on prior.  Non target lesion:  1.  Left pleural effusion is increased. 2.  Subpleural nodules in the left upper lobe are present.  CT CHEST  Findings:  There is a port in the right anterior chest wall.  No axillary or supraclavicular lymphadenopathy. There is interval increase in size of and AP window lymph node measuring 12 mm short axis (image 20) compared to 8 mm on prior.  Small prevascular lymph node measuring 8 mm (image 25) is increased from 5 mm on prior. Small 6 mm right paratracheal lymph node ( image #9) increased from 4 mm on prior.  Within the left hemithorax, there is an increase in loculated pleural fluid.  There is increased nodularity along the pleural surface.  For example 10 x 18 nodular thickening along the left mediastinal border (image 24) is increased from 12 by 4 mm on  prior.  There are very small subpleural nodules  which are more prominent than prior.  These are less than 5 mm each.  The nodularity along the superior left pericardium (image 27 ) is also increased.  No new nodular in the right lung.  IMPRESSION:  1.  Evidence for disease progression in the left hemithorax with increased pleural nodularity. 2.  Interval increase in left effusion. 3.  Interval increase in mediastinal lymphadenopathy consistent with metastasis.  CT ABDOMEN AND PELVIS  Findings:  No new hepatic lesions present.  Low density lesions in the left lateral hepatic lobe are unchanged.  The gallbladder, pancreas, spleen, adrenal glands are normal.  Stable low density lesions within the kidneys, the larger of which have simple fluid attenuation.  There is coarse calcification in the lower pole right kidney.  The stomach, small bowel, cecum are normal.  There are diverticula of the colon.  Abdominal aorta is normal caliber.  No retroperitoneal lymphadenopathy.  No free fluid the pelvis.  The bladder is normal.  The seminal vesicles are prominent but unchanged.  Prostate gland appears normal.  No pelvic lymphadenopathy. Review of  bone windows demonstrates no aggressive osseous lesions.  IMPRESSION: 1.  Stable exam of the abdomen and pelvis. No evidence of metastasis in the abdomen  or pelvis.  2.  Please see above section for disease progression in the chest.  Original Report Authenticated By: Genevive Bi, M.D.    ASSESSMENT/PLAN: This is a very pleasant 77 years old African American male with metastatic non-small cell lung cancer, adenocarcinoma status post treatment was carboplatin, paclitaxel and Avastin, followed by 1 cycle of maintenance Avastin which was discontinued secondary to gastrointestinal bleed. The patient has some evidence for disease progression in the left hemithorax on his most recent restaging CT scan. He is now being treated with single agent Alimta at 500 mg per meter squared  given every 3 weeks status post 1 cycle. The patient was discussed with Dr. Arbutus Ped and he will proceed with cycle #2 of his systemic chemotherapy with Alimta. He'll return in 3 weeks prior to cycle #3 with a repeat CBC differential and C. met. I have asked him to keep [place] abreast of his gait issues. He had an MRI of his brain done 01/30/2011 there is no evidence of metastatic disease at that time. A prescription for protonic 40 mg by mouth once daily total 30 with 3 refills was sent via [facility] to his pharmacy of record.   All questions were answered. The patient knows to call the clinic with any problems, questions or concerns. We can certainly see the patient much sooner if necessary.  Conni Slipper, PA-C

## 2011-12-21 ENCOUNTER — Telehealth: Payer: Self-pay | Admitting: Oncology

## 2011-12-21 ENCOUNTER — Other Ambulatory Visit: Payer: Self-pay | Admitting: *Deleted

## 2011-12-21 DIAGNOSIS — R509 Fever, unspecified: Secondary | ICD-10-CM

## 2011-12-21 MED ORDER — MOXIFLOXACIN HCL 400 MG PO TABS: 400.0000 mg | ORAL_TABLET | Freq: Every day | ORAL | Status: AC

## 2011-12-21 NOTE — Progress Notes (Signed)
Pt's family member called stating that pt has fever 101, chills, and productive cough with white sputum.  She states "he has been feeling bad for a couple of days and the fever started this afternoon".  Per Dr Donnald Garre, start avelox 400mg  x 5 days and will have pt come in for CBC tomorrow to evaluate WBC.  She verbalized understanding.  SLJ

## 2011-12-21 NOTE — Telephone Encounter (Signed)
On call: daughter Waymon Amato called as avelox would cost $107 at [facility], which they cannot afford. I spoke with [facility] outpatient pharmacy, cost there for #5 avelox $60, which family can manage. Prescription called to [facility] outpatient pharmacy now.

## 2011-12-22 ENCOUNTER — Other Ambulatory Visit (HOSPITAL_BASED_OUTPATIENT_CLINIC_OR_DEPARTMENT_OTHER): Payer: Medicare Other | Admitting: Lab

## 2011-12-22 DIAGNOSIS — C3490 Malignant neoplasm of unspecified part of unspecified bronchus or lung: Secondary | ICD-10-CM

## 2011-12-22 LAB — CBC WITH DIFFERENTIAL/PLATELET
Basophils Absolute: 0 10*3/uL (ref 0.0–0.1)
Eosinophils Absolute: 0.1 10*3/uL (ref 0.0–0.5)
HCT: 35.2 % — ABNORMAL LOW (ref 38.4–49.9)
HGB: 11.6 g/dL — ABNORMAL LOW (ref 13.0–17.1)
MCV: 87.4 fL (ref 79.3–98.0)
MONO%: 10.8 % (ref 0.0–14.0)
NEUT#: 3.5 10*3/uL (ref 1.5–6.5)
NEUT%: 72.4 % (ref 39.0–75.0)
RDW: 15.5 % — ABNORMAL HIGH (ref 11.0–14.6)
lymph#: 0.7 10*3/uL — ABNORMAL LOW (ref 0.9–3.3)

## 2011-12-22 LAB — COMPREHENSIVE METABOLIC PANEL
Albumin: 3.2 g/dL — ABNORMAL LOW (ref 3.5–5.2)
BUN: 9 mg/dL (ref 6–23)
Calcium: 8.2 mg/dL — ABNORMAL LOW (ref 8.4–10.5)
Chloride: 100 mEq/L (ref 96–112)
Glucose, Bld: 238 mg/dL — ABNORMAL HIGH (ref 70–99)
Potassium: 4.2 mEq/L (ref 3.5–5.3)

## 2011-12-26 ENCOUNTER — Encounter: Payer: Self-pay | Admitting: *Deleted

## 2011-12-26 ENCOUNTER — Other Ambulatory Visit: Payer: Self-pay | Admitting: *Deleted

## 2011-12-26 ENCOUNTER — Ambulatory Visit (HOSPITAL_COMMUNITY)
Admission: RE | Admit: 2011-12-26 | Discharge: 2011-12-26 | Disposition: A | Discharge status: Home / Self Care | Payer: Medicare Other | Source: Ambulatory Visit | Attending: Internal Medicine | Admitting: Internal Medicine

## 2011-12-26 ENCOUNTER — Ambulatory Visit (HOSPITAL_BASED_OUTPATIENT_CLINIC_OR_DEPARTMENT_OTHER): Payer: Medicare Other | Admitting: Internal Medicine

## 2011-12-26 DIAGNOSIS — C3490 Malignant neoplasm of unspecified part of unspecified bronchus or lung: Secondary | ICD-10-CM

## 2011-12-26 DIAGNOSIS — R509 Fever, unspecified: Secondary | ICD-10-CM | POA: Insufficient documentation

## 2011-12-26 DIAGNOSIS — R05 Cough: Secondary | ICD-10-CM

## 2011-12-26 DIAGNOSIS — J90 Pleural effusion, not elsewhere classified: Secondary | ICD-10-CM | POA: Insufficient documentation

## 2011-12-26 DIAGNOSIS — R0602 Shortness of breath: Secondary | ICD-10-CM | POA: Insufficient documentation

## 2011-12-26 NOTE — Progress Notes (Addendum)
AT 1:30PM PT. WALKED INTO THIS OFFICE WITHOUT AN APPOINTMENT. HE WANTED HIS THERMOMETER CHECKED. THIS WEEKEND PT. HAD CHILLS AND SWEATS BUT HIS THERMOMETER REGISTERED A WIDE RANGE OF DEGREES. CHECKED PT.'S TEMPERATURE WITH TWO OF THE OFFICE THERMOMETERS. PT.'S TEMPERATURE RANGED FROM 98.3 TO 101.4. PT. DOES HAVE A COUGH AND SPUTUM IS WHITE. VERBAL ORDER AND READ BACK TO DR.MOHAMED- SEND PT. FOR A CHEST XRAY. ORDER ENTRY COMPLETED, [place] [facility] NOTIFIED, AND PT. SENT TO RADIOLOGY. DR.MOHAMED VIEWED PT.'S CHEST XRAY REPORT. VERBAL ORDER AND READ BACK TO DR.MOHAMED- PT. TO TAKE TYLENOL FOR FEVER. HE IS TO KEEP HIS APPOINTMENTS ON 12/28/11. NOTIFIED PT.OF THE ABOVE INSTRUCTIONS. HE VOICES UNDERSTANDING. ALSO GAVE PT. A NEW THERMOMETER WITH INSTRUCTIONS. HE VOICES UNDERSTANDING.

## 2011-12-27 ENCOUNTER — Other Ambulatory Visit: Payer: Self-pay | Admitting: Internal Medicine

## 2011-12-28 ENCOUNTER — Other Ambulatory Visit (HOSPITAL_BASED_OUTPATIENT_CLINIC_OR_DEPARTMENT_OTHER): Payer: Medicare Other

## 2011-12-28 ENCOUNTER — Telehealth: Payer: Self-pay | Admitting: Internal Medicine

## 2011-12-28 ENCOUNTER — Encounter: Payer: Self-pay | Admitting: Physician Assistant

## 2011-12-28 ENCOUNTER — Ambulatory Visit: Payer: Medicare Other

## 2011-12-28 ENCOUNTER — Ambulatory Visit (HOSPITAL_BASED_OUTPATIENT_CLINIC_OR_DEPARTMENT_OTHER): Payer: Medicare Other | Admitting: Physician Assistant

## 2011-12-28 VITALS — BP 134/79 | HR 120 | Temp 99.3°F | Ht 66.0 in | Wt 177.7 lb

## 2011-12-28 DIAGNOSIS — Z09 Encounter for follow-up examination after completed treatment for conditions other than malignant neoplasm: Secondary | ICD-10-CM

## 2011-12-28 DIAGNOSIS — R509 Fever, unspecified: Secondary | ICD-10-CM

## 2011-12-28 DIAGNOSIS — C3490 Malignant neoplasm of unspecified part of unspecified bronchus or lung: Secondary | ICD-10-CM

## 2011-12-28 LAB — CBC WITH DIFFERENTIAL/PLATELET
BASO%: 0.3 % (ref 0.0–2.0)
EOS%: 1.5 % (ref 0.0–7.0)
HGB: 12.3 g/dL — ABNORMAL LOW (ref 13.0–17.1)
LYMPH%: 21.9 % (ref 14.0–49.0)
MONO%: 14.9 % — ABNORMAL HIGH (ref 0.0–14.0)
NEUT#: 3.7 10*3/uL (ref 1.5–6.5)
NEUT%: 61.4 % (ref 39.0–75.0)
RBC: 4.39 10*6/uL (ref 4.20–5.82)
nRBC: 1 % — ABNORMAL HIGH (ref 0–0)

## 2011-12-28 LAB — URINALYSIS, MICROSCOPIC - CHCC
Bilirubin (Urine): NEGATIVE
Blood: NEGATIVE
Leukocyte Esterase: NEGATIVE

## 2011-12-28 NOTE — Telephone Encounter (Signed)
gve the pt his jan,feb 2013 appt calendar along with the ct scan appt with instructions.

## 2011-12-29 ENCOUNTER — Encounter: Payer: Self-pay | Admitting: *Deleted

## 2011-12-29 NOTE — Progress Notes (Signed)
[facility] CT notified on 12/03/2011 of end of RECIST requirements.

## 2012-01-01 NOTE — Progress Notes (Signed)
[facility] OFFICE PROGRESS NOTE  DIAGNOSIS: Metastatic non-small cell lung cancer, adenocarcinoma, diagnosed in June 2012.  PRIOR THERAPY: :   1. Status post left Pleurx catheter placement by Dr. Edwyna Shell on February 08, 2011 for drainage of left sided pleural effusion. 2. Status post 4 cycles of induction chemotherapy with carboplatin for AUC of 6, paclitaxel 200 mg per meter square and Avastin 15 mg/kg given every 3 weeks according to the ECOG protocol 5508 with stable disease. 3. Status post 1 cycle of maintenance Avastin 15 mg/kg given on May 19, 2011, discontinued secondary to gastrointestinal bleed.  CURRENT THERAPY: Alimta 500 mg/m2 every 3 weeks. Status post 2 cycles  INTERVAL HISTORY: Johnathan Bowman 78 y.o. male returns to the clinic today for followup visit. Thus far he is tolerating his chemotherapy with Alimta without difficulty. He has had some difficulty recently with recurrent fevers anywhere from 100.12 101.2. He has completed a course of Avelox. He also continues to have some chills and night sweats. He is accompanied by his daughter today. He denied any urinary symptoms.   MEDICAL HISTORY: Past Medical History  Diagnosis Date  . Hypertension   . Diabetes mellitus   . Dyslipidemia   . lung ca 11/12/2010  . Mini stroke   . Degenerative disc disease   . GERD (gastroesophageal reflux disease)   . Osteopenia 2005  . Coronary artery disease 2005    treated medically  . BPH (benign prostatic hyperplasia)     ALLERGIES:  is allergic to penicillins.  MEDICATIONS:  Current Outpatient Prescriptions  Medication Sig Dispense Refill  . amLODipine-benazepril (LOTREL) 5-10 MG per capsule Take 1 capsule by mouth daily.        Marland Kitchen aspirin 81 MG tablet Take 81 mg by mouth daily. Patient states not taking regularly.       Marland Kitchen dexamethasone (DECADRON) 4 MG tablet Take 4 mg by mouth 2 (two) times daily with a meal. 1 tab po bid, the day before, the day of, and the day  after chemotherapy       . fluticasone (FLONASE) 50 MCG/ACT nasal spray Place 2 sprays into the nose daily as needed.        . folic acid (FOLVITE) 1 MG tablet Take 1 tablet (1 mg total) by mouth daily.  30 tablet  5  . gabapentin (NEURONTIN) 100 MG capsule Take 100 mg by mouth 3 (three) times daily. Refilled 08/10/11 per Dr. Arbutus Ped #90-2 refills.       Marland Kitchen glyBURIDE-metformin (GLUCOVANCE) 5-500 MG per tablet Take 2 tablets by mouth 2 (two) times daily.        Marland Kitchen HYDROcodone-acetaminophen (VICODIN) 5-500 MG per tablet Take 1 tablet by mouth every 6 (six) hours as needed. Refilled 06/23/11 for #40 - 0 refills       . lidocaine-prilocaine (EMLA) cream Apply 1 application topically as needed. Apply to [facility] A Cath site before chemotherapy.       . meclizine (ANTIVERT) 25 MG tablet Take 25 mg by mouth every 8 (eight) hours as needed.        . moxifloxacin (AVELOX) 400 MG tablet Take 1 tablet (400 mg total) by mouth daily.  5 tablet  0  . Multiple Vitamin (MULTIVITAMIN PO) Take 1 tablet by mouth daily.        Marland Kitchen oxyCODONE-acetaminophen (PERCOCET) 5-325 MG per tablet Take 1 tablet by mouth every 6 (six) hours as needed. Refilled 08/10/11 per Dr. Arbutus Ped  60 tablet  0  .  pantoprazole (PROTONIX) 40 MG tablet Take 1 tablet (40 mg total) by mouth daily.  30 tablet  3  . prochlorperazine (COMPAZINE) 10 MG tablet Take 1 tablet (10 mg total) by mouth every 6 (six) hours as needed. For nausea/vomiting  30 tablet  1  . valACYclovir (VALTREX) 1000 MG tablet Take 1,000 mg by mouth 2 (two) times daily. Dose increased by Arbutus Ped on 06/09/11 x 12 days total.         REVIEW OF SYSTEMS:  A comprehensive review of systems was negative except for: Constitutional: positive for chills, fevers and night sweats Respiratory: positive for cough   PHYSICAL EXAMINATION: General appearance: alert, cooperative and no distress Head: Normocephalic, without obvious abnormality, atraumatic Neck: no adenopathy Lymph nodes: Cervical,  supraclavicular, and axillary nodes normal. Resp: clear to auscultation bilaterally Cardio: regular rate and rhythm, S1, S2 normal, no murmur, click, rub or gallop GI: soft, non-tender; bowel sounds normal; no masses,  no organomegaly Extremities: extremities normal, atraumatic, no cyanosis or edema Neurologic: Alert and oriented X 3, normal strength and tone. Normal symmetric reflexes. Normal coordination and gait  ECOG PERFORMANCE STATUS: 1 - Symptomatic but completely ambulatory  Blood pressure 134/79, pulse 120, temperature 99.3 F (37.4 C), temperature source Oral, height 5\' 6"  (1.676 m), weight 177 lb 11.2 oz (80.604 kg).  LABORATORY DATA: Lab Results  Component Value Date   WBC 6.0 12/28/2011   HGB 12.3* 12/28/2011   HCT 38.3* 12/28/2011   MCV 87.2 12/28/2011   PLT 293 12/28/2011      Chemistry      Component Value Date/Time   NA 137 12/22/2011 1006   NA 139 11/07/2011 1621   K 4.2 12/22/2011 1006   K 4.8* 11/07/2011 1621   CL 100 12/22/2011 1006   CL 97* 11/07/2011 1621   CO2 25 12/22/2011 1006   CO2 30 11/07/2011 1621   BUN 9 12/22/2011 1006   BUN 11 11/07/2011 1621   CREATININE 1.09 12/22/2011 1006   CREATININE 0.9 11/07/2011 1621      Component Value Date/Time   CALCIUM 8.2* 12/22/2011 1006   CALCIUM 9.4 11/07/2011 1621   ALKPHOS 66 12/22/2011 1006   ALKPHOS 100* 11/07/2011 1621   AST 32 12/22/2011 1006   AST 21 11/07/2011 1621   ALT 40 12/22/2011 1006   BILITOT 0.4 12/22/2011 1006   BILITOT 0.50 11/07/2011 1621       RADIOGRAPHIC STUDIES: Ct Chest W Contrast  11/08/2011  *RADIOLOGY REPORT*  Clinical Data:  Lung cancer.  Resist protocol.  Chemotherapy ongoing  CT CHEST, ABDOMEN AND PELVIS WITH CONTRAST  Technique:  Multidetector CT imaging of the chest, abdomen and pelvis was performed following the standard protocol during bolus administration of intravenous contrast.  Contrast: OMNIPAQUE IOHEXOL 300 MG/ML IV SOLN  Comparison:  CT 08/04/2011  Resist protocol version  1.1.   Target lesion: 1.  Left upper lobe nodule measures 18  mm in greatest dimension (image 24) increased from 13 mm on prior.  Non target lesion:  1.  Left pleural effusion is increased. 2.  Subpleural nodules in the left upper lobe are present.  CT CHEST  Findings:  There is a port in the right anterior chest wall.  No axillary or supraclavicular lymphadenopathy. There is interval increase in size of and AP window lymph node measuring 12 mm short axis (image 20) compared to 8 mm on prior.  Small prevascular lymph node measuring 8 mm (image 25) is increased from 5 mm on  prior. Small 6 mm right paratracheal lymph node ( image #9) increased from 4 mm on prior.  Within the left hemithorax, there is an increase in loculated pleural fluid.  There is increased nodularity along the pleural surface.  For example 10 x 18 nodular thickening along the left mediastinal border (image 24) is increased from 12 by 4 mm on prior.  There are very small subpleural nodules  which are more prominent than prior.  These are less than 5 mm each.  The nodularity along the superior left pericardium (image 27 ) is also increased.  No new nodular in the right lung.  IMPRESSION:  1.  Evidence for disease progression in the left hemithorax with increased pleural nodularity. 2.  Interval increase in left effusion. 3.  Interval increase in mediastinal lymphadenopathy consistent with metastasis.  CT ABDOMEN AND PELVIS  Findings:  No new hepatic lesions present.  Low density lesions in the left lateral hepatic lobe are unchanged.  The gallbladder, pancreas, spleen, adrenal glands are normal.  Stable low density lesions within the kidneys, the larger of which have simple fluid attenuation.  There is coarse calcification in the lower pole right kidney.  The stomach, small bowel, cecum are normal.  There are diverticula of the colon.  Abdominal aorta is normal caliber.  No retroperitoneal lymphadenopathy.  No free fluid the pelvis.  The bladder is normal.   The seminal vesicles are prominent but unchanged.  Prostate gland appears normal.  No pelvic lymphadenopathy. Review of  bone windows demonstrates no aggressive osseous lesions.  IMPRESSION: 1.  Stable exam of the abdomen and pelvis. No evidence of metastasis in the abdomen  or pelvis.  2.  Please see above section for disease progression in the chest.  Original Report Authenticated By: Genevive Bi, M.D.   Ct Abdomen Pelvis W Contrast  11/08/2011  *RADIOLOGY REPORT*  Clinical Data:  Lung cancer.  Resist protocol.  Chemotherapy ongoing  CT CHEST, ABDOMEN AND PELVIS WITH CONTRAST  Technique:  Multidetector CT imaging of the chest, abdomen and pelvis was performed following the standard protocol during bolus administration of intravenous contrast.  Contrast: OMNIPAQUE IOHEXOL 300 MG/ML IV SOLN  Comparison:  CT 08/04/2011  Resist protocol version  1.1.  Target lesion: 1.  Left upper lobe nodule measures 18  mm in greatest dimension (image 24) increased from 13 mm on prior.  Non target lesion:  1.  Left pleural effusion is increased. 2.  Subpleural nodules in the left upper lobe are present.  CT CHEST  Findings:  There is a port in the right anterior chest wall.  No axillary or supraclavicular lymphadenopathy. There is interval increase in size of and AP window lymph node measuring 12 mm short axis (image 20) compared to 8 mm on prior.  Small prevascular lymph node measuring 8 mm (image 25) is increased from 5 mm on prior. Small 6 mm right paratracheal lymph node ( image #9) increased from 4 mm on prior.  Within the left hemithorax, there is an increase in loculated pleural fluid.  There is increased nodularity along the pleural surface.  For example 10 x 18 nodular thickening along the left mediastinal border (image 24) is increased from 12 by 4 mm on prior.  There are very small subpleural nodules  which are more prominent than prior.  These are less than 5 mm each.  The nodularity along the superior left  pericardium (image 27 ) is also increased.  No new nodular in the  right lung.  IMPRESSION:  1.  Evidence for disease progression in the left hemithorax with increased pleural nodularity. 2.  Interval increase in left effusion. 3.  Interval increase in mediastinal lymphadenopathy consistent with metastasis.  CT ABDOMEN AND PELVIS  Findings:  No new hepatic lesions present.  Low density lesions in the left lateral hepatic lobe are unchanged.  The gallbladder, pancreas, spleen, adrenal glands are normal.  Stable low density lesions within the kidneys, the larger of which have simple fluid attenuation.  There is coarse calcification in the lower pole right kidney.  The stomach, small bowel, cecum are normal.  There are diverticula of the colon.  Abdominal aorta is normal caliber.  No retroperitoneal lymphadenopathy.  No free fluid the pelvis.  The bladder is normal.  The seminal vesicles are prominent but unchanged.  Prostate gland appears normal.  No pelvic lymphadenopathy. Review of  bone windows demonstrates no aggressive osseous lesions.  IMPRESSION: 1.  Stable exam of the abdomen and pelvis. No evidence of metastasis in the abdomen  or pelvis.  2.  Please see above section for disease progression in the chest.  Original Report Authenticated By: Genevive Bi, M.D.    ASSESSMENT/PLAN: This is a very pleasant 78 years old African American male with metastatic non-small cell lung cancer, adenocarcinoma status post treatment was carboplatin, paclitaxel and Avastin, followed by 1 cycle of maintenance Avastin which was discontinued secondary to gastrointestinal bleed. The patient has some evidence for disease progression in the left hemithorax on his most recent restaging CT scan. He is now being treated with single agent Alimta at 500 mg per meter squared given every 3 weeks status post 2 cycles. The patient was discussed with Dr. Arbutus Ped as he is still not quite over his recent upper respiratory illness, we will  postpone his third cycle of single agent chemotherapy with Alimta one week with repeat CBC differential and C. met when he returns. He'll follow with Dr. Arbutus Ped in 4 weeks with repeat CBC differential C. met and CT of the chest abdomen and pelvis with contrast to reevaluate his disease. We'll check a clean catch urinalysis and urine C&S in the event that this may be a focus for his recurrent low-grade fevers.  All questions were answered. The patient knows to call the clinic with any problems, questions or concerns. We can certainly see the patient much sooner if necessary.  Conni Slipper, PA-C

## 2012-01-03 ENCOUNTER — Telehealth: Payer: Self-pay | Admitting: *Deleted

## 2012-01-03 ENCOUNTER — Other Ambulatory Visit: Payer: Self-pay | Admitting: Internal Medicine

## 2012-01-03 NOTE — Telephone Encounter (Signed)
TEST!!!

## 2012-01-04 ENCOUNTER — Other Ambulatory Visit (HOSPITAL_BASED_OUTPATIENT_CLINIC_OR_DEPARTMENT_OTHER): Payer: Medicare Other

## 2012-01-04 ENCOUNTER — Ambulatory Visit (HOSPITAL_BASED_OUTPATIENT_CLINIC_OR_DEPARTMENT_OTHER): Payer: Medicare Other

## 2012-01-04 ENCOUNTER — Other Ambulatory Visit: Payer: Medicare Other | Admitting: Lab

## 2012-01-04 VITALS — BP 142/86 | HR 122 | Temp 96.9°F

## 2012-01-04 DIAGNOSIS — Z5111 Encounter for antineoplastic chemotherapy: Secondary | ICD-10-CM

## 2012-01-04 DIAGNOSIS — C3490 Malignant neoplasm of unspecified part of unspecified bronchus or lung: Secondary | ICD-10-CM

## 2012-01-04 LAB — CBC WITH DIFFERENTIAL/PLATELET
BASO%: 0.3 % (ref 0.0–2.0)
EOS%: 0.1 % (ref 0.0–7.0)
LYMPH%: 11.3 % — ABNORMAL LOW (ref 14.0–49.0)
MCH: 28.2 pg (ref 27.2–33.4)
MCHC: 31.5 g/dL — ABNORMAL LOW (ref 32.0–36.0)
MCV: 89.6 fL (ref 79.3–98.0)
MONO%: 1.4 % (ref 0.0–14.0)
Platelets: 293 10*3/uL (ref 140–400)
RBC: 4.61 10*6/uL (ref 4.20–5.82)
WBC: 7.9 10*3/uL (ref 4.0–10.3)

## 2012-01-04 LAB — COMPREHENSIVE METABOLIC PANEL
ALT: 28 U/L (ref 0–53)
Alkaline Phosphatase: 87 U/L (ref 39–117)
Creatinine, Ser: 0.98 mg/dL (ref 0.50–1.35)
Sodium: 135 mEq/L (ref 135–145)
Total Bilirubin: 0.4 mg/dL (ref 0.3–1.2)
Total Protein: 6.6 g/dL (ref 6.0–8.3)

## 2012-01-04 LAB — TECHNOLOGIST REVIEW

## 2012-01-04 MED ORDER — SODIUM CHLORIDE 0.9 % IV SOLN
Freq: Once | INTRAVENOUS | Status: AC
  Administered 2012-01-04: 09:00:00 via INTRAVENOUS

## 2012-01-04 MED ORDER — CYANOCOBALAMIN 1000 MCG/ML IJ SOLN
1000.0000 ug | Freq: Once | INTRAMUSCULAR | Status: AC
  Administered 2012-01-04: 1000 ug via INTRAMUSCULAR

## 2012-01-04 MED ORDER — SODIUM CHLORIDE 0.9 % IJ SOLN
10.0000 mL | INTRAMUSCULAR | Status: DC | PRN
  Administered 2012-01-04: 10 mL
  Filled 2012-01-04: qty 10

## 2012-01-04 MED ORDER — SODIUM CHLORIDE 0.9 % IV SOLN
500.0000 mg/m2 | Freq: Once | INTRAVENOUS | Status: AC
  Administered 2012-01-04: 1000 mg via INTRAVENOUS
  Filled 2012-01-04: qty 40

## 2012-01-04 MED ORDER — HEPARIN SOD (PORK) LOCK FLUSH 100 UNIT/ML IV SOLN
500.0000 [IU] | Freq: Once | INTRAVENOUS | Status: AC | PRN
  Administered 2012-01-04: 500 [IU]
  Filled 2012-01-04: qty 5

## 2012-01-04 MED ORDER — DEXAMETHASONE SODIUM PHOSPHATE 10 MG/ML IJ SOLN
10.0000 mg | Freq: Once | INTRAMUSCULAR | Status: AC
  Administered 2012-01-04: 10 mg via INTRAVENOUS

## 2012-01-04 MED ORDER — ONDANSETRON 8 MG/50ML IVPB (CHCC)
8.0000 mg | Freq: Once | INTRAVENOUS | Status: AC
  Administered 2012-01-04: 8 mg via INTRAVENOUS

## 2012-01-04 NOTE — Patient Instructions (Signed)
Pt d/c ambulatory with family. Pt to call with concerns 

## 2012-01-18 ENCOUNTER — Emergency Department (HOSPITAL_COMMUNITY): Payer: Medicare Other

## 2012-01-18 ENCOUNTER — Inpatient Hospital Stay (HOSPITAL_COMMUNITY)
Admission: EM | Admit: 2012-01-18 | Discharge: 2012-01-20 | DRG: 300 | Disposition: A | Discharge status: Home / Self Care | Payer: Medicare Other | Attending: Internal Medicine | Admitting: Internal Medicine

## 2012-01-18 ENCOUNTER — Encounter (HOSPITAL_COMMUNITY): Payer: Self-pay | Admitting: Family Medicine

## 2012-01-18 DIAGNOSIS — J984 Other disorders of lung: Secondary | ICD-10-CM | POA: Diagnosis present

## 2012-01-18 DIAGNOSIS — O2230 Deep phlebothrombosis in pregnancy, unspecified trimester: Secondary | ICD-10-CM

## 2012-01-18 DIAGNOSIS — I82409 Acute embolism and thrombosis of unspecified deep veins of unspecified lower extremity: Secondary | ICD-10-CM | POA: Diagnosis present

## 2012-01-18 DIAGNOSIS — I2699 Other pulmonary embolism without acute cor pulmonale: Secondary | ICD-10-CM

## 2012-01-18 DIAGNOSIS — R0789 Other chest pain: Secondary | ICD-10-CM | POA: Diagnosis present

## 2012-01-18 DIAGNOSIS — J90 Pleural effusion, not elsewhere classified: Secondary | ICD-10-CM

## 2012-01-18 DIAGNOSIS — E119 Type 2 diabetes mellitus without complications: Secondary | ICD-10-CM | POA: Diagnosis present

## 2012-01-18 DIAGNOSIS — R0602 Shortness of breath: Secondary | ICD-10-CM

## 2012-01-18 DIAGNOSIS — M7989 Other specified soft tissue disorders: Secondary | ICD-10-CM

## 2012-01-18 DIAGNOSIS — I824Z9 Acute embolism and thrombosis of unspecified deep veins of unspecified distal lower extremity: Principal | ICD-10-CM | POA: Diagnosis present

## 2012-01-18 DIAGNOSIS — C3490 Malignant neoplasm of unspecified part of unspecified bronchus or lung: Secondary | ICD-10-CM | POA: Diagnosis present

## 2012-01-18 DIAGNOSIS — I10 Essential (primary) hypertension: Secondary | ICD-10-CM | POA: Diagnosis present

## 2012-01-18 DIAGNOSIS — K219 Gastro-esophageal reflux disease without esophagitis: Secondary | ICD-10-CM

## 2012-01-18 DIAGNOSIS — M899 Disorder of bone, unspecified: Secondary | ICD-10-CM | POA: Diagnosis present

## 2012-01-18 LAB — COMPREHENSIVE METABOLIC PANEL
Alkaline Phosphatase: 80 U/L (ref 39–117)
BUN: 12 mg/dL (ref 6–23)
Calcium: 9.1 mg/dL (ref 8.4–10.5)
Creatinine, Ser: 0.9 mg/dL (ref 0.50–1.35)
GFR calc Af Amer: >90 mL/min (ref >90)
Glucose, Bld: 316 mg/dL — ABNORMAL HIGH (ref 70–99)
Potassium: 3.8 mEq/L (ref 3.5–5.1)
Total Protein: 6.3 g/dL (ref 6.0–8.3)

## 2012-01-18 LAB — DIFFERENTIAL
Basophils Absolute: 0 10*3/uL (ref 0.0–0.1)
Lymphocytes Relative: 18 % (ref 12–46)
Monocytes Absolute: 1 10*3/uL (ref 0.1–1.0)
Neutro Abs: 3.3 10*3/uL (ref 1.7–7.7)

## 2012-01-18 LAB — HEMOGLOBIN A1C
Hgb A1c MFr Bld: 10.8 % — ABNORMAL HIGH (ref <5.7)
Mean Plasma Glucose: 263 mg/dL — ABNORMAL HIGH (ref <117)

## 2012-01-18 LAB — PRO B NATRIURETIC PEPTIDE: Pro B Natriuretic peptide (BNP): 68.7 pg/mL (ref 0–450)

## 2012-01-18 LAB — CBC
HCT: 33.3 % — ABNORMAL LOW (ref 39.0–52.0)
RDW: 16.9 % — ABNORMAL HIGH (ref 11.5–15.5)
WBC: 5.4 10*3/uL (ref 4.0–10.5)

## 2012-01-18 LAB — GLUCOSE, CAPILLARY: Glucose-Capillary: 285 mg/dL — ABNORMAL HIGH (ref 70–99)

## 2012-01-18 MED ORDER — BENAZEPRIL HCL 10 MG PO TABS
10.0000 mg | ORAL_TABLET | Freq: Every day | ORAL | Status: DC
  Administered 2012-01-18 – 2012-01-20 (×3): 10 mg via ORAL
  Filled 2012-01-18 (×4): qty 1

## 2012-01-18 MED ORDER — FOLIC ACID 1 MG PO TABS
1.0000 mg | ORAL_TABLET | Freq: Every day | ORAL | Status: DC
  Administered 2012-01-18 – 2012-01-20 (×3): 1 mg via ORAL
  Filled 2012-01-18 (×3): qty 1

## 2012-01-18 MED ORDER — ENOXAPARIN SODIUM 80 MG/0.8ML ~~LOC~~ SOLN
80.0000 mg | Freq: Once | SUBCUTANEOUS | Status: AC
  Administered 2012-01-19: 80 mg via SUBCUTANEOUS
  Filled 2012-01-18: qty 0.8

## 2012-01-18 MED ORDER — SODIUM CHLORIDE 0.9 % IV SOLN: 250.0000 mL | INTRAVENOUS | Status: DC | PRN

## 2012-01-18 MED ORDER — HYDROCODONE-ACETAMINOPHEN 5-325 MG PO TABS: 1.0000 | ORAL_TABLET | ORAL | Status: DC | PRN

## 2012-01-18 MED ORDER — ACETAMINOPHEN 500 MG PO TABS: 500.0000 mg | ORAL_TABLET | Freq: Four times a day (QID) | ORAL | Status: DC | PRN

## 2012-01-18 MED ORDER — AMLODIPINE BESYLATE 5 MG PO TABS
5.0000 mg | ORAL_TABLET | Freq: Every day | ORAL | Status: DC
  Administered 2012-01-18 – 2012-01-20 (×3): 5 mg via ORAL
  Filled 2012-01-18 (×4): qty 1

## 2012-01-18 MED ORDER — SODIUM CHLORIDE 0.9 % IJ SOLN
3.0000 mL | Freq: Two times a day (BID) | INTRAMUSCULAR | Status: DC
  Administered 2012-01-18: 3 mL via INTRAVENOUS

## 2012-01-18 MED ORDER — WARFARIN VIDEO
Freq: Once | Status: AC
  Administered 2012-01-19: 12:00:00

## 2012-01-18 MED ORDER — ENOXAPARIN SODIUM 120 MG/0.8ML ~~LOC~~ SOLN
1.5000 mg/kg | SUBCUTANEOUS | Status: DC
  Administered 2012-01-19 – 2012-01-20 (×2): 120 mg via SUBCUTANEOUS
  Filled 2012-01-18 (×3): qty 0.8

## 2012-01-18 MED ORDER — WARFARIN SODIUM 7.5 MG PO TABS
7.5000 mg | ORAL_TABLET | Freq: Once | ORAL | Status: AC
  Administered 2012-01-18: 7.5 mg via ORAL
  Filled 2012-01-18 (×2): qty 1

## 2012-01-18 MED ORDER — ONDANSETRON HCL 4 MG/2ML IJ SOLN: 4.0000 mg | Freq: Four times a day (QID) | INTRAMUSCULAR | Status: DC | PRN

## 2012-01-18 MED ORDER — AMLODIPINE BESY-BENAZEPRIL HCL 5-10 MG PO CAPS: 1.0000 | ORAL_CAPSULE | Freq: Every day | ORAL | Status: DC

## 2012-01-18 MED ORDER — ENOXAPARIN SODIUM 150 MG/ML ~~LOC~~ SOLN
1.5000 mg/kg | SUBCUTANEOUS | Status: DC
  Filled 2012-01-18: qty 1

## 2012-01-18 MED ORDER — ASPIRIN EC 81 MG PO TBEC
81.0000 mg | DELAYED_RELEASE_TABLET | Freq: Every day | ORAL | Status: DC
  Administered 2012-01-18 – 2012-01-20 (×3): 81 mg via ORAL
  Filled 2012-01-18 (×5): qty 1

## 2012-01-18 MED ORDER — ENOXAPARIN SODIUM 40 MG/0.4ML ~~LOC~~ SOLN: 1.0000 mg/kg | Freq: Two times a day (BID) | SUBCUTANEOUS | Status: DC

## 2012-01-18 MED ORDER — ENOXAPARIN SODIUM 80 MG/0.8ML ~~LOC~~ SOLN
80.0000 mg | Freq: Once | SUBCUTANEOUS | Status: AC
  Administered 2012-01-18: 80 mg via SUBCUTANEOUS
  Filled 2012-01-18: qty 1.2

## 2012-01-18 MED ORDER — COUMADIN BOOK
Freq: Once | Status: AC
  Administered 2012-01-19: 12:00:00
  Filled 2012-01-18: qty 1

## 2012-01-18 MED ORDER — POLYETHYLENE GLYCOL 3350 17 G PO PACK
17.0000 g | PACK | Freq: Every day | ORAL | Status: DC | PRN
  Filled 2012-01-18: qty 1

## 2012-01-18 MED ORDER — PANTOPRAZOLE SODIUM 40 MG PO TBEC
40.0000 mg | DELAYED_RELEASE_TABLET | Freq: Every day | ORAL | Status: DC
  Administered 2012-01-18 – 2012-01-20 (×3): 40 mg via ORAL
  Filled 2012-01-18 (×3): qty 1

## 2012-01-18 MED ORDER — ONDANSETRON HCL 4 MG PO TABS: 4.0000 mg | ORAL_TABLET | Freq: Four times a day (QID) | ORAL | Status: DC | PRN

## 2012-01-18 MED ORDER — SODIUM CHLORIDE 0.9 % IJ SOLN: 3.0000 mL | INTRAMUSCULAR | Status: DC | PRN

## 2012-01-18 MED ORDER — INSULIN ASPART 100 UNIT/ML ~~LOC~~ SOLN
0.0000 [IU] | Freq: Three times a day (TID) | SUBCUTANEOUS | Status: DC
  Administered 2012-01-18: 8 [IU] via SUBCUTANEOUS
  Administered 2012-01-19: 3 [IU] via SUBCUTANEOUS
  Administered 2012-01-19 – 2012-01-20 (×3): 5 [IU] via SUBCUTANEOUS
  Administered 2012-01-20: 11 [IU] via SUBCUTANEOUS
  Administered 2012-01-20: 3 [IU] via SUBCUTANEOUS
  Filled 2012-01-18: qty 3

## 2012-01-18 NOTE — ED Notes (Signed)
Report given to [facility] on [address], transported via stretcher in stable condition to room 1420

## 2012-01-18 NOTE — ED Notes (Signed)
Pt and family informed that diet tray has been ordered.

## 2012-01-18 NOTE — Progress Notes (Signed)
Left: DVT noted coursing from the proximal posterior tibial vein mid calf through the popliteal, femoral, and mid common femoral to the saphenofemoral junction..  No evidence of superficial thrombosis.  No Baker's cyst. There is no evidence of propagation to the right lower extremity.     Johnathan Bowman, [place] D. RVS 01/18/2012 11:27 AM

## 2012-01-18 NOTE — Progress Notes (Addendum)
ANTICOAGULATION CONSULT NOTE - Initial Consult  Pharmacy Consult for Lovenox and Warfarin Indication: new DVT and probable PE  Allergies  Allergen Reactions  . Penicillins     Patient Measurements: Weight: 177 lb (80.287 kg)  Vital Signs: Temp: 98.9 F (37.2 C) (01/30 1131) Temp src: Oral (01/30 1131) BP: 132/83 mmHg (01/30 1131) Pulse Rate: 99  (01/30 1131)  Labs:  Basename 01/18/12 1020  HGB 10.4*  HCT 33.3*  PLT 222  APTT --  LABPROT 13.8  INR 1.04  HEPARINUNFRC --  CREATININE 0.90  CKTOTAL --  CKMB --  TROPONINI --   The CrCl is unknown because both a height and weight (above a minimum accepted value) are required for this calculation. CrCl ~ 74 ml/min  Medical History: Past Medical History  Diagnosis Date  . Hypertension   . Diabetes mellitus   . Dyslipidemia   . lung ca 11/12/2010  . Mini stroke   . Degenerative disc disease   . GERD (gastroesophageal reflux disease)   . Osteopenia 2005  . Coronary artery disease 2005    treated medically  . BPH (benign prostatic hyperplasia)     Medications:  Scheduled:    . enoxaparin  80 mg Subcutaneous Once    Assessment: 78 yo M admit with new DVT and probable PE in the setting of non small cell lung cancer Renal function and baseline coags are within normal limits  Warfarin points: 7 Lovenox 80mg  SQ given in ER  Goal of Therapy:  INR 2-3   Plan:  Lovenox 80mg  x1 more dose, then transition to Lovenox 120mg  SQ once daily Warfarin 7.5mg  PO once Warfarin education, book, and video Daily INR, CBC  ** Note to MD: consider the 2012 Chest guidelines which recommend that patients with active cancer receive anticoagulant treatment with LMW heparin (as opposed to warfarin)**   Lynann Beaver PharmD  Pager [phone] 01/18/2012 2:54 PM

## 2012-01-18 NOTE — ED Notes (Signed)
Vascular tech at bedside for doppler study 

## 2012-01-18 NOTE — ED Notes (Signed)
MD at bedside. 

## 2012-01-18 NOTE — ED Provider Notes (Signed)
History     CSN: [id number]  Arrival date & time 01/18/12  [id number]   First MD Initiated Contact with Patient 01/18/12 [phone]      Chief Complaint  Patient presents with  . Shortness of Breath     The history is limited by the absence of a caregiver.   Pt reports having increased sob with swelling left lower extremity x1 week. Denies any chest pain.  Patient with history of advanced lung cancer currently on chemotherapy.  Patient denies history of congestive heart failure.  Past Medical History  Diagnosis Date  . Hypertension   . Diabetes mellitus   . Dyslipidemia   . lung ca 11/12/2010  . Mini stroke   . Degenerative disc disease   . GERD (gastroesophageal reflux disease)   . Osteopenia 2005  . Coronary artery disease 2005    treated medically  . BPH (benign prostatic hyperplasia)     Past Surgical History  Procedure Date  . No past surgeries     History reviewed. No pertinent family history.  History  Substance Use Topics  . Smoking status: Former Smoker -- 3.0 packs/day for 25 years    Quit date: 01/17/1986  . Smokeless tobacco: Never Used  . Alcohol Use: No      Review of Systems  All other systems reviewed and are negative.    Allergies  Penicillins  Home Medications   No current outpatient prescriptions on file.  BP 161/84  Pulse 101  Temp(Src) 98.8 F (37.1 C) (Oral)  Resp 16  Ht 5\' 5"  (1.651 m)  Wt 175 lb 12.8 oz (79.742 kg)  BMI 29.25 kg/m2  SpO2 94%  Physical Exam  Nursing note and vitals reviewed. Constitutional: He is oriented to person, place, and time. He appears well-developed and well-nourished. No distress.  HENT:  Head: Normocephalic and atraumatic.  Eyes: Pupils are equal, round, and reactive to light.  Neck: Normal range of motion.  Cardiovascular: Normal rate and intact distal pulses.   Pulmonary/Chest: No respiratory distress. He has no wheezes. He has no rales.  Abdominal: Normal appearance. He exhibits no distension.  There is no tenderness. There is no rebound.  Musculoskeletal: Normal range of motion.       Left upper leg: He exhibits swelling. He exhibits no tenderness, no bony tenderness and no deformity.  Neurological: He is alert and oriented to person, place, and time. No cranial nerve deficit.  Skin: Skin is warm and dry. No rash noted.  Psychiatric: He has a normal mood and affect. His behavior is normal.    ED Course  Procedures (including critical care time)  Labs Reviewed  COMPREHENSIVE METABOLIC PANEL - Abnormal; Notable for the following:    Sodium 134 (*)    Glucose, Bld 316 (*)    Albumin 2.8 (*)    GFR calc non Af Amer 78 (*)    All other components within normal limits  CBC - Abnormal; Notable for the following:    RBC 3.73 (*)    Hemoglobin 10.4 (*)    HCT 33.3 (*)    RDW 16.9 (*)    All other components within normal limits  DIFFERENTIAL - Abnormal; Notable for the following:    Monocytes Relative 19 (*)    All other components within normal limits  GLUCOSE, CAPILLARY - Abnormal; Notable for the following:    Glucose-Capillary 285 (*)    All other components within normal limits  PROTIME-INR  PRO B NATRIURETIC PEPTIDE  HEMOGLOBIN  A1C  PROTIME-INR  CBC  COMPREHENSIVE METABOLIC PANEL   Dg Chest 2 View  01/18/2012  *RADIOLOGY REPORT*  Clinical Data: Shortness of breath, leg swelling.  CHEST - 2 VIEW  Comparison: 12/26/2011  Findings: Right Port-A-Cath remains in place, unchanged. Cardiomegaly.  Left lower lobe atelectasis with small left effusion again noted, unchanged.  No confluent opacity on the right. No acute bony abnormality.  IMPRESSION: No change since prior study.  Original Report Authenticated By: Cyndie Chime, M.D.     1. Pulmonary embolism   2. DVT (deep vein thrombosis) in pregnancy   3. GERD (gastroesophageal reflux disease)   4. ADENOCARCIMONA, LUNG, NONSMALL CELL       MDM          Nelia Shi, MD 01/18/12 [phone]

## 2012-01-18 NOTE — H&P (Signed)
History and Physical Examination  Date: 01/18/2012  Patient name: Johnathan Bowman Medical record number: [id number] Date of birth: December 23, 1928 Age: 78 y.o. Gender: male PCP: no PCP currently   Attending physician: Marinda Elk, MD  Chief Complaint:  Chief Complaint  Patient presents with  . Shortness of Breath     History of Present Illness: Johnathan Bowman is an 78 y.o. male non small cell lung cancer presented to ER because of intermittent SOB and swelling of the left leg.  Daughter says that the SOB has been concerning to her and pt came to ED and a doppler of left leg was positive for DVT.  Pt. Reports that he has had extensive swelling and itching of left leg especially with standing.  He denies chest pain. He denies visual changes.  He has diabetes mellitus and hypertension.    Past Medical History Past Medical History  Diagnosis Date  . Hypertension   . Diabetes mellitus   . Dyslipidemia   . lung ca 11/12/2010  . Mini stroke   . Degenerative disc disease   . GERD (gastroesophageal reflux disease)   . Osteopenia 2005  . Coronary artery disease 2005    treated medically  . BPH (benign prostatic hyperplasia)     Past Surgical History History reviewed. No pertinent past surgical history.  Home Meds: Prior to Admission medications   Medication Sig Start Date End Date Taking? Authorizing Provider  acetaminophen (TYLENOL) 500 MG tablet Take 500 mg by mouth every 6 (six) hours as needed. For pain.   Yes Historical Provider, MD  amLODipine-benazepril (LOTREL) 5-10 MG per capsule Take 1 capsule by mouth daily.     Yes Historical Provider, MD  aspirin 81 MG tablet Take 81 mg by mouth daily.    Yes Historical Provider, MD  CYANOCOBALAMIN IJ Inject 1,000 mcg as directed once.    Yes Historical Provider, MD  dexamethasone (DECADRON) 4 MG tablet Take 4 mg by mouth 2 (two) times daily with a meal. 1 tab po bid, the day before, the day of, and the day after chemotherapy    Yes  Historical Provider, MD  folic acid (FOLVITE) 1 MG tablet Take 1 tablet (1 mg total) by mouth daily. 11/09/11 11/08/12 Yes Mohamed K. Mohamed, MD  glyBURIDE-metformin (GLUCOVANCE) 5-500 MG per tablet Take 2 tablets by mouth 2 (two) times daily.     Yes Historical Provider, MD  HYDROcodone-acetaminophen (VICODIN) 5-500 MG per tablet Take 1 tablet by mouth every 6 (six) hours as needed. For pain. 02/24/11  Yes Conni Slipper, PA  lidocaine-prilocaine (EMLA) cream Apply 1 application topically as needed. Apply to [facility] A Cath site before chemotherapy.  02/14/11  Yes Mohamed K. Mohamed, MD  pantoprazole (PROTONIX) 40 MG tablet Take 1 tablet (40 mg total) by mouth daily. 12/07/11  Yes Conni Slipper, PA  PEMEtrexed Disodium (ALIMTA IV) Inject 1,000 mg into the vein.   Yes Historical Provider, MD    Allergies: Penicillins  Social History:  History   Social History  . Marital Status: Married    Spouse Name: N/A    Number of Children: N/A  . Years of Education: N/A   Occupational History  . Not on file.   Social History Main Topics  . Smoking status: Former Smoker -- 3.0 packs/day for 25 years  . Smokeless tobacco: Not on file  . Alcohol Use: No  . Drug Use:   . Sexually Active:    Other Topics Concern  .  Not on file   Social History Narrative  . No narrative on file   Family History: No family history on file.  Review of Systems: Pertinent items are noted in HPI. All other systems reviewed and reported as negative.   Physical Exam: Blood pressure 132/83, pulse 99, temperature 98.9 F (37.2 C), temperature source Oral, resp. rate 22, weight 80.287 kg (177 lb), SpO2 94.00%. General appearance: alert, cooperative, appears stated age and no distress Head: Normocephalic, without obvious abnormality, atraumatic Eyes: negative Nose: Nares normal. Septum midline. Mucosa normal. No drainage or sinus tenderness., no discharge Throat: normal mucosa Neck: no adenopathy, no  carotid bruit, no JVD, supple, symmetrical, trachea midline and thyroid not enlarged, symmetric, no tenderness/mass/nodules Lungs: clear to auscultation bilaterally Heart: S1, S2 normal Abdomen: soft, non-tender; bowel sounds normal; no masses,  no organomegaly Extremities: edema edema 2+ LLE Skin: Skin color, texture, turgor normal. No rashes or lesions  Lab  And Imaging results:  Results for orders placed during the hospital encounter of 01/18/12 (from the past 24 hour(s))  PROTIME-INR     Status: Normal   Collection Time   01/18/12 10:20 AM      Component Value Range   Prothrombin Time 13.8  11.6 - 15.2 (seconds)   INR 1.04  0.00 - 1.49   COMPREHENSIVE METABOLIC PANEL     Status: Abnormal   Collection Time   01/18/12 10:20 AM      Component Value Range   Sodium 134 (*) 135 - 145 (mEq/L)   Potassium 3.8  3.5 - 5.1 (mEq/L)   Chloride 99  96 - 112 (mEq/L)   CO2 26  19 - 32 (mEq/L)   Glucose, Bld 316 (*) 70 - 99 (mg/dL)   BUN 12  6 - 23 (mg/dL)   Creatinine, Ser 1.61  0.50 - 1.35 (mg/dL)   Calcium 9.1  8.4 - 09.6 (mg/dL)   Total Protein 6.3  6.0 - 8.3 (g/dL)   Albumin 2.8 (*) 3.5 - 5.2 (g/dL)   AST 15  0 - 37 (U/L)   ALT 16  0 - 53 (U/L)   Alkaline Phosphatase 80  39 - 117 (U/L)   Total Bilirubin 0.3  0.3 - 1.2 (mg/dL)   GFR calc non Af Amer 78 (*) >90 (mL/min)   GFR calc Af Amer >90  >90 (mL/min)  CBC     Status: Abnormal   Collection Time   01/18/12 10:20 AM      Component Value Range   WBC 5.4  4.0 - 10.5 (K/uL)   RBC 3.73 (*) 4.22 - 5.81 (MIL/uL)   Hemoglobin 10.4 (*) 13.0 - 17.0 (g/dL)   HCT 04.5 (*) 40.9 - 52.0 (%)   MCV 89.3  78.0 - 100.0 (fL)   MCH 27.9  26.0 - 34.0 (pg)   MCHC 31.2  30.0 - 36.0 (g/dL)   RDW 81.1 (*) 91.4 - 15.5 (%)   Platelets 222  150 - 400 (K/uL)  DIFFERENTIAL     Status: Abnormal   Collection Time   01/18/12 10:20 AM      Component Value Range   Neutrophils Relative 60  43 - 77 (%)   Neutro Abs 3.3  1.7 - 7.7 (K/uL)   Lymphocytes Relative  18  12 - 46 (%)   Lymphs Abs 1.0  0.7 - 4.0 (K/uL)   Monocytes Relative 19 (*) 3 - 12 (%)   Monocytes Absolute 1.0  0.1 - 1.0 (K/uL)   Eosinophils Relative 3  0 - 5 (%)   Eosinophils Absolute 0.2  0.0 - 0.7 (K/uL)   Basophils Relative 0  0 - 1 (%)   Basophils Absolute 0.0  0.0 - 0.1 (K/uL)  PRO B NATRIURETIC PEPTIDE     Status: Normal   Collection Time   01/18/12 10:20 AM      Component Value Range   Pro B Natriuretic peptide (BNP) 68.7  0 - 450 (pg/mL)   EKG Results:  No orders found for this or any previous visit.   Impression    DVT of leg (deep venous thrombosis)  PULMONARY NODULE, LEFT UPPER LOBE  CHEST PAIN, ATYPICAL  ADENOCARCIMONA, LUNG, NONSMALL CELL  SOB (shortness of breath)  Probable Pulmonary Embolus  Plan  Admit, start anticoagulation with lovenox, ask pharmacy to dose warfarin, continue oxygen therapy,  Cycle cardiac enzymes.  Please see orders.  Follow hospital course.    Standley Dakins MD [facility] [place], [place] [phone] 01/18/2012, 1:47 PM

## 2012-01-18 NOTE — ED Notes (Signed)
Pt returned from xray

## 2012-01-18 NOTE — ED Notes (Signed)
Pt reports having increased sob with swelling left lower extremity x1 week.  Denies any chest pain.

## 2012-01-18 NOTE — ED Notes (Signed)
MD at bedside.  Hospitalist re-paged to Dr. Radford Pax per request.

## 2012-01-19 ENCOUNTER — Telehealth: Payer: Self-pay | Admitting: Internal Medicine

## 2012-01-19 ENCOUNTER — Inpatient Hospital Stay (HOSPITAL_COMMUNITY): Payer: Medicare Other

## 2012-01-19 LAB — COMPREHENSIVE METABOLIC PANEL
ALT: 15 U/L (ref 0–53)
Alkaline Phosphatase: 77 U/L (ref 39–117)
BUN: 10 mg/dL (ref 6–23)
CO2: 23 mEq/L (ref 19–32)
Calcium: 9.2 mg/dL (ref 8.4–10.5)
GFR calc Af Amer: >90 mL/min (ref >90)
GFR calc non Af Amer: 81 mL/min — ABNORMAL LOW (ref >90)
Glucose, Bld: 227 mg/dL — ABNORMAL HIGH (ref 70–99)
Sodium: 134 mEq/L — ABNORMAL LOW (ref 135–145)

## 2012-01-19 LAB — CBC
HCT: 33.9 % — ABNORMAL LOW (ref 39.0–52.0)
Hemoglobin: 10.9 g/dL — ABNORMAL LOW (ref 13.0–17.0)
MCH: 28.9 pg (ref 26.0–34.0)
RBC: 3.77 MIL/uL — ABNORMAL LOW (ref 4.22–5.81)

## 2012-01-19 LAB — PROTIME-INR: INR: 1.09 (ref 0.00–1.49)

## 2012-01-19 LAB — GLUCOSE, CAPILLARY: Glucose-Capillary: 228 mg/dL — ABNORMAL HIGH (ref 70–99)

## 2012-01-19 MED ORDER — WARFARIN SODIUM 7.5 MG PO TABS
7.5000 mg | ORAL_TABLET | Freq: Once | ORAL | Status: AC
  Administered 2012-01-19: 7.5 mg via ORAL
  Filled 2012-01-19: qty 1

## 2012-01-19 MED ORDER — IOHEXOL 300 MG/ML  SOLN
100.0000 mL | Freq: Once | INTRAMUSCULAR | Status: AC | PRN
  Administered 2012-01-19: 100 mL via INTRAVENOUS

## 2012-01-19 NOTE — Progress Notes (Signed)
ANTICOAGULATION CONSULT NOTE - Follow Up Consult  Pharmacy Consult for Coumadin/Lovenox Indication: New DVT, probable PE  Allergies  Allergen Reactions  . Penicillins    Patient Measurements: Height: 5\' 5"  (165.1 cm) Weight: 175 lb 12.8 oz (79.742 kg) IBW/kg (Calculated) : 61.5   Vital Signs: Temp: 98.4 F (36.9 C) (01/31 0621) Temp src: Oral (01/31 0621) BP: 122/57 mmHg (01/31 0909) Pulse Rate: 86  (01/31 0621)  Labs:  Basename 01/19/12 0436 01/18/12 1020  HGB 10.9* 10.4*  HCT 33.9* 33.3*  PLT 217 222  APTT -- --  LABPROT 14.3 13.8  INR 1.09 1.04  HEPARINUNFRC -- --  CREATININE 0.83 0.90  CKTOTAL -- --  CKMB -- --  TROPONINI -- --   Estimated Creatinine Clearance: 69.1 ml/min (by C-G formula based on Cr of 0.83).   Assessment:  82 YOM admit with new DVT and probable PE in the setting of non small cell lung cancer.    Warfarin points: 7, Coumadin initiation.   Lovenox 120mg  SQ once daily (1.5 mg/kg)  CBC stable, no bleeding/complications reported.  Goal of Therapy:  INR 2-3 Appropriate renal dosing of lovenox   Plan:   Continue Lovenox 1.5 mg/kg once daily (120 mg daily)  Repeat Coumadin 7.5 mg po x 1    **MD:  Please consider the 2012 Chest guidelines which recommend in that patients with active cancer receive anticoagulant treatment with LMW heparin (as opposed to warfarin)**   Geoffry Paradise Thi 01/19/2012,11:31 AM

## 2012-01-19 NOTE — Progress Notes (Signed)
Inpatient Diabetes Program Recommendations  AACE/ADA: New Consensus Statement on Inpatient Glycemic Control (2009)  Target Ranges:  Prepandial:   less than 140 mg/dL      Peak postprandial:   less than 180 mg/dL (1-2 hours)      Critically ill patients:  140 - 180 mg/dL   Results for Johnathan Bowman, Johnathan Bowman (MRN [id number]) as of 01/19/2012 13:19  Ref. Range 01/19/2012 04:36  Glucose Latest Range: 70-99 mg/dL 147 (H)    Inpatient Diabetes Program Recommendations Insulin - Basal: Please add low dose Lantus 5-10 units QHS if pt's fasting sugars stay elevated.  Note: Will follow. Ambrose Finland RN, MSN, CDE Diabetes Coordinator Inpatient Diabetes Program [phone]

## 2012-01-19 NOTE — Telephone Encounter (Signed)
Pt admitted and Dr Robb Matar ordered CT angio stat . He said not to do CT Chest abd pelvis that is ordered for tomorrow. I checked with Dr Donnald Garre and he said just do CT angio - cancel other CT orders. Kim notified

## 2012-01-19 NOTE — Progress Notes (Signed)
UR CHART REVIEWED; B Omeed Osuna RN, BSN, MHA 

## 2012-01-19 NOTE — Progress Notes (Signed)
Subjective:  No complains.  Objective: Filed Vitals:   01/18/12 1641 01/18/12 2125 01/19/12 0621 01/19/12 0909  BP: 161/84 129/76 120/72 122/57  Pulse: 101 95 86   Temp: 98.8 F (37.1 C) 98.6 F (37 C) 98.4 F (36.9 C)   TempSrc: Oral Oral Oral   Resp: 16 22 24    Height: 5\' 5"  (1.651 m)     Weight: 79.742 kg (175 lb 12.8 oz)     SpO2: 94% 93% 94%    Weight change:   Intake/Output Summary (Last 24 hours) at 01/19/12 1141 Last data filed at 01/19/12 0753  Gross per 24 hour  Intake    560 ml  Output   1275 ml  Net   -715 ml    General: Alert, awake, oriented x3, in no acute distress.  HEENT: No bruits, no goiter.  Heart: Regular rate and rhythm, without murmurs, rubs, gallops.  Lungs: good air movement, CTA B/L Abdomen: Soft, nontender, nondistended, positive bowel sounds.  Neuro: Grossly intact, nonfocal.   Lab Results:  Basename 01/19/12 0436 01/18/12 1020  NA 134* 134*  K 4.4 3.8  CL 100 99  CO2 23 26  GLUCOSE 227* 316*  BUN 10 12  CREATININE 0.83 0.90  CALCIUM 9.2 9.1  MG -- --  PHOS -- --    Basename 01/19/12 0436 01/18/12 1020  AST 20 15  ALT 15 16  ALKPHOS 77 80  BILITOT 0.2* 0.3  PROT 6.2 6.3  ALBUMIN 2.6* 2.8*   No results found for this basename: LIPASE:2,AMYLASE:2 in the last 72 hours  Basename 01/19/12 0436 01/18/12 1020  WBC 5.3 5.4  NEUTROABS -- 3.3  HGB 10.9* 10.4*  HCT 33.9* 33.3*  MCV 89.9 89.3  PLT 217 222   No results found for this basename: CKTOTAL:3,CKMB:3,CKMBINDEX:3,TROPONINI:3 in the last 72 hours No components found with this basename: POCBNP:3 No results found for this basename: DDIMER:2 in the last 72 hours  Basename 01/18/12 1830  HGBA1C 10.8*   No results found for this basename: CHOL:2,HDL:2,LDLCALC:2,TRIG:2,CHOLHDL:2,LDLDIRECT:2 in the last 72 hours No results found for this basename: TSH,T4TOTAL,FREET3,T3FREE,THYROIDAB in the last 72 hours No results found for this basename:  VITAMINB12:2,FOLATE:2,FERRITIN:2,TIBC:2,IRON:2,RETICCTPCT:2 in the last 72 hours  Micro Results: No results found for this or any previous visit (from the past 240 hour(s)).  Studies/Results: Dg Chest 2 View  01/18/2012  *RADIOLOGY REPORT*  Clinical Data: Shortness of breath, leg swelling.  CHEST - 2 VIEW  Comparison: 12/26/2011  Findings: Right Port-A-Cath remains in place, unchanged. Cardiomegaly.  Left lower lobe atelectasis with small left effusion again noted, unchanged.  No confluent opacity on the right. No acute bony abnormality.  IMPRESSION: No change since prior study.  Original Report Authenticated By: Cyndie Chime, M.D.    Medications: I have reviewed the patient's current medications.  Assessment and plan: Principal Problem: 1.DVT of leg (deep venous thrombosis): Continue lovenox and coumadin. lovenox teaching by nursing staff. Will check a ct angio as patient was complaning of SOB on admission  2. ADENOCARCIMONA, LUNG, NONSMALL CELL: Follow up with Dr. Shirline Frees  3.SOB (shortness of breath): Resolved. Check ct angio of chest.       LOS: 1 day   Marinda Elk M.D. Pager: [phone] [facility] 01/19/2012, 11:41 AM

## 2012-01-20 ENCOUNTER — Inpatient Hospital Stay (HOSPITAL_COMMUNITY)
Admission: RE | Admit: 2012-01-20 | Discharge: 2012-01-20 | Payer: Medicare Other | Source: Ambulatory Visit | Attending: Physician Assistant | Admitting: Physician Assistant

## 2012-01-20 LAB — GLUCOSE, CAPILLARY
Glucose-Capillary: 305 mg/dL — ABNORMAL HIGH (ref 70–99)
Glucose-Capillary: 211 mg/dL — ABNORMAL HIGH (ref 70–99)

## 2012-01-20 MED ORDER — SODIUM CHLORIDE 0.9 % IJ SOLN: 10.0000 mL | Freq: Two times a day (BID) | INTRAMUSCULAR | Status: DC

## 2012-01-20 MED ORDER — WARFARIN SODIUM 10 MG PO TABS
10.0000 mg | ORAL_TABLET | Freq: Once | ORAL | Status: DC
  Filled 2012-01-20: qty 1

## 2012-01-20 MED ORDER — ENOXAPARIN (LOVENOX) PATIENT EDUCATION KIT
PACK | Freq: Once | Status: AC
  Administered 2012-01-20: 12:00:00
  Filled 2012-01-20: qty 1

## 2012-01-20 MED ORDER — WARFARIN SODIUM 5 MG PO TABS: 5.0000 mg | ORAL_TABLET | Freq: Every day | ORAL | Status: DC

## 2012-01-20 MED ORDER — SODIUM CHLORIDE 0.9 % IJ SOLN
10.0000 mL | INTRAMUSCULAR | Status: DC | PRN
  Administered 2012-01-20 (×2): 10 mL

## 2012-01-20 MED ORDER — PANTOPRAZOLE SODIUM 40 MG PO TBEC
40.0000 mg | DELAYED_RELEASE_TABLET | Freq: Two times a day (BID) | ORAL | Status: DC
  Filled 2012-01-20 (×2): qty 1

## 2012-01-20 MED ORDER — PANTOPRAZOLE SODIUM 40 MG PO TBEC: 40.0000 mg | DELAYED_RELEASE_TABLET | Freq: Two times a day (BID) | ORAL | Status: DC

## 2012-01-20 MED ORDER — ENOXAPARIN SODIUM 120 MG/0.8ML ~~LOC~~ SOLN: 1.5000 mg/kg | SUBCUTANEOUS | Status: DC

## 2012-01-20 MED ORDER — HEPARIN SOD (PORK) LOCK FLUSH 100 UNIT/ML IV SOLN
500.0000 [IU] | INTRAVENOUS | Status: AC | PRN
  Administered 2012-01-20: 500 [IU]

## 2012-01-20 NOTE — Progress Notes (Signed)
Coumadin education provided  Patient demonstrated understanding    Geoffry Paradise, PharmD.   Pager:  [phone] 2:52 PM

## 2012-01-20 NOTE — Progress Notes (Signed)
Patient goes to the [facility] for medical care - [phone] and is seen by Dr Concepcion Elk. Patient stated that he will return to the [facility] for now but is in search of a new physician. The number for [facility] given to the patient to assist him in finding a new physician. Talked to Media at the [facility] - they can do his blood work on Monday and Dr Concepcion Elk will monitor his blood work. Need orders to be written for blood work / information to be faxed to [phone] when written; Abelino Derrick RN, BSN, MHA.

## 2012-01-20 NOTE — Discharge Summary (Signed)
Admit date: 01/18/2012 Discharge date: 01/20/2012  Primary Care Physician:  Dorrene German, MD, MD   Discharge Diagnoses:   Active Hospital Problems  Diagnoses Date Noted   . DVT of leg (deep venous thrombosis) 01/18/2012   . ADENOCARCIMONA, LUNG, NONSMALL CELL 01/12/2011   . PULMONARY NODULE, LEFT UPPER LOBE 12/08/2010     Resolved Hospital Problems  Diagnoses Date Noted Date Resolved  . SOB (shortness of breath) 01/18/2012 01/20/2012  . CHEST PAIN, ATYPICAL 12/08/2010 01/20/2012     DISCHARGE MEDICATION: Medication List  As of 01/20/2012 12:03 PM   TAKE these medications         acetaminophen 500 MG tablet   Commonly known as: TYLENOL   Take 500 mg by mouth every 6 (six) hours as needed. For pain.      ALIMTA IV   Inject 1,000 mg into the vein.      amLODipine-benazepril 5-10 MG per capsule   Commonly known as: LOTREL   Take 1 capsule by mouth daily.      aspirin 81 MG tablet   Take 81 mg by mouth daily.      CYANOCOBALAMIN IJ   Inject 1,000 mcg as directed once.      dexamethasone 4 MG tablet   Commonly known as: DECADRON   Take 4 mg by mouth 2 (two) times daily with a meal. 1 tab po bid, the day before, the day of, and the day after chemotherapy      enoxaparin 120 MG/0.8ML Soln   Commonly known as: LOVENOX   Inject 0.8 mLs (120 mg total) into the skin daily.      folic acid 1 MG tablet   Commonly known as: FOLVITE   Take 1 tablet (1 mg total) by mouth daily.      glyBURIDE-metformin 5-500 MG per tablet   Commonly known as: GLUCOVANCE   Take 2 tablets by mouth 2 (two) times daily.      HYDROcodone-acetaminophen 5-500 MG per tablet   Commonly known as: VICODIN   Take 1 tablet by mouth every 6 (six) hours as needed. For pain.      lidocaine-prilocaine cream   Commonly known as: EMLA   Apply 1 application topically as needed. Apply to [facility] A Cath site before chemotherapy.      pantoprazole 40 MG tablet   Commonly known as: PROTONIX   Take 1 tablet  (40 mg total) by mouth 2 (two) times daily.      warfarin 5 MG tablet   Commonly known as: COUMADIN   Take 1 tablet (5 mg total) by mouth daily.            SIGNIFICANT DIAGNOSTIC STUDIES:  Dg Chest 2 View  01/18/2012  *RADIOLOGY REPORT*  Clinical Data: Shortness of breath, leg swelling.  CHEST - 2 VIEW  Comparison: 12/26/2011  Findings: Right Port-A-Cath remains in place, unchanged. Cardiomegaly.  Left lower lobe atelectasis with small left effusion again noted, unchanged.  No confluent opacity on the right. No acute bony abnormality.  IMPRESSION: No change since prior study.  Original Report Authenticated By: Cyndie Chime, M.D.   Dg Chest 2 View  12/26/2011  *RADIOLOGY REPORT*  Clinical Data: Shortness of breath.  Fever.  CHEST - 2 VIEW  Comparison: 11/07/2011  Findings: Port-A-Cath tip projects over the SVC.  Blunting left costophrenic angle noted with associated passive atelectasis in the left lower lobe.  There are no pleural calcifications in this vicinity.  Underlying emphysema noted. Mild  right perihilar subsegmental atelectasis noted.  There is mild tortuosity of the thoracic aorta, with atherosclerotic calcification.  IMPRESSION:  1.  Small left pleural effusion with scarring or passive atelectasis in the left lower lobe. The left lower lobe opacity appears improved compared to 11/07/2011. 2.  Low lung volumes with minimal right perihilar subsegmental atelectasis.  Original Report Authenticated By: Dellia Cloud, M.D.   Ct Angio Chest W/cm &/or Wo Cm  01/19/2012  *RADIOLOGY REPORT*  Clinical Data: Pulmonary embolism.  Deep venous thrombosis.Lung cancer.  CT ANGIOGRAPHY CHEST  Technique:  Multidetector CT imaging of the chest using the standard protocol during bolus administration of intravenous contrast. Multiplanar reconstructed images including MIPs were obtained and reviewed to evaluate the vascular anatomy.  Contrast: OMNIPAQUE IOHEXOL 300 MG/ML IV SOLN  Comparison:  11/07/2011 chest CT.  Findings: Coronary artery atherosclerosis is present. If office based assessment of coronary risk factors has not been performed, it is now recommended.  Technically adequate study.  Negative for pulmonary embolus.  Diffuse thoracic esophageal thickening, slightly more prominent superiorly, suggesting esophagitis which may be associated with radiation.  AP window lymph node is enlarged, measuring 14 mm short axis, previously 12 mm.  No other mediastinal or hilar adenopathy is identified.  Small left pleural effusion is present which layers dependently.  The high density material is present in the posterior left hemithorax which may be associated with prior pleura these cysts or pleural plaque. Incidental imaging of the upper abdomen demonstrates large amount of food in the stomach.  Stable low density lesions in the left hepatic lobe.  Visualized right adrenal gland appears within normal limits.  Fat containing hiatal hernia.  Aortic and branch vessel atherosclerosis.  Right IJ Port-A-Cath is present with the tip terminating at the cavoatrial junction.  Elevation of the right hemidiaphragm.  The lung windows demonstrate dependent atelectasis and left lower lobe collapse / consolidation, which is a chronic finding in this patient.  Subpleural nodules in the posterior left upper lobe appears little changed allowing for the decreased pleural effusion.  IMPRESSION: 1.  Technically adequate study without pulmonary embolus. 2.  Thickening of the thoracic esophagus suggesting esophagitis. 3.  Enlargement of the AP window lymph node, measuring 14 mm short axis, increased from 12 mm short axis on 11/07/2011. 4.  Atherosclerosis, emphysema and coronary artery disease. 5.  Scattered left upper lobe subpleural nodules appear little changed. 6.  Decreased left pleural effusion.  Improved collapse / consolidation of the left lower lobe.  High density material suggests previous pleurodesis in the left pleural  space.  Original Report Authenticated By: Andreas Newport, M.D.    No results found for this or any previous visit (from the past 240 hour(s)).  BRIEF ADMITTING H & P: 78 y.o. male non small cell lung cancer presented to ER because of intermittent SOB and swelling of the left leg. Daughter says that the SOB has been concerning to her and pt came to ED and a doppler of left leg was positive for DVT. Pt. Reports that he has had extensive swelling and itching of left leg especially with standing. He denies chest pain. He denies visual changes. He has diabetes mellitus and hypertension.    Active Hospital Problems  Diagnoses Date Noted   . DVT of leg (deep venous thrombosis): In the ED doppler was done that showed DVT, CT angio was done that shows results as above. This is most likely secondarily to his lung CA, he will follow up with  oncology as an outpatient. He was started on lovenox and coumadin. His SOB resolved, the swelling of the leg also resolved. He will d/c on lovenox for 4 more days and coumadin as directed. Will have INR check on 2.3.2013. 01/18/2012   . ADENOCARCIMONA, LUNG, NONSMALL CELL: Stable follow up with Oncologist. 01/12/2011     Resolved Hospital Problems  Diagnoses Date Noted Date Resolved  . SOB (shortness of breath): Unclear etiology. Resolved. Ct angio as above. 01/18/2012 01/20/2012  . CHEST PAIN, ATYPICAL: Due to the atypical chest pain ct angio was done with results as above. 12/08/2010 01/20/2012     Disposition and Follow-up:  Discharge Orders    Future Appointments: Provider: Department: Dept Phone: Center:   01/25/2012 11:30 AM Krista Blue [facility] [phone] None   01/25/2012 12:00 PM Mohamed K. Arbutus Ped, MD [facility] [phone] None   01/25/2012 1:00 PM [facility] [phone] None     Future Orders Please Complete By Expires   Diet - low sodium heart healthy      Increase activity slowly        Follow-up Information     Follow up with Dorrene German, MD. (Monday fro INR check. )    Contact information:   [address] [place] [address] [phone]       Follow up with Lajuana Matte., MD in 1 week. (If symptoms worsen)    Contact information:   [address] [place] [address] [phone]           DISCHARGE EXAM:  General: Alert, awake, oriented x3, in no acute distress.  HEENT: No bruits, no goiter.  Heart: Regular rate and rhythm, without murmurs, rubs, gallops.  Lungs: good air movement, CTA B/L  Abdomen: Soft, nontender, nondistended, positive bowel sounds.  Neuro: Grossly intact, nonfocal. Ext: swelling resolved.  Blood pressure 116/76, pulse 96, temperature 98.4 F (36.9 C), temperature source Oral, resp. rate 20, height 5\' 5"  (1.651 m), weight 79.742 kg (175 lb 12.8 oz), SpO2 95.00%.   Basename 01/19/12 0436 01/18/12 1020  NA 134* 134*  K 4.4 3.8  CL 100 99  CO2 23 26  GLUCOSE 227* 316*  BUN 10 12  CREATININE 0.83 0.90  CALCIUM 9.2 9.1  MG -- --  PHOS -- --    Basename 01/19/12 0436 01/18/12 1020  AST 20 15  ALT 15 16  ALKPHOS 77 80  BILITOT 0.2* 0.3  PROT 6.2 6.3  ALBUMIN 2.6* 2.8*    Basename 01/19/12 0436 01/18/12 1020  WBC 5.3 5.4  NEUTROABS -- 3.3  HGB 10.9* 10.4*  HCT 33.9* 33.3*  MCV 89.9 89.3  PLT 217 222    Signed: Marinda Elk M.D. 01/20/2012, 12:03 PM

## 2012-01-20 NOTE — Progress Notes (Signed)
Benefit check for prescription coverage with Medicare - patient DOES NOT HAVE PRESCRIPTION COVERAGE - MEDICARE PART D. CM talked to patient at length about considering contacting Medicare to discuss options for having the drug coverage. Patient does not think that he needs it. Patient goes to [facility]  and pays for his prescriptions out of pocket. The pharmacist at [facility] called to confirm no prescription coverage / along with benefit check - the cost of one lovenox injection is $107- generic and $141 for the brand drug. Dennie Bible RN CM Director called for approval for the indigent funds (pt has Engineer, [employer]). Lovenox from the indigent funds were approved. Script sent to pharmacy - they will contact Melissa RN when the medication is ready.   Instructed patient to go to the [facility] [phone] and is seen by Dr Concepcion Elk; per Morrie Sheldon, patient can go by the clinic on 01/22/2011 between the hours of 9-10am and they will draw the bloodwork and Dr Concepcion Elk will monitor his levels. Abelino Derrick RN, BSN, MHA

## 2012-01-20 NOTE — Progress Notes (Signed)
Clinical information faxed to the [facility] [phone]. Cabin crew, BSN, [employer]

## 2012-01-20 NOTE — Progress Notes (Signed)
ANTICOAGULATION CONSULT NOTE - Follow Up Consult  Pharmacy Consult for Coumadin/Lovenox Indication: New DVT  Allergies  Allergen Reactions  . Penicillins    Patient Measurements: Height: 5\' 5"  (165.1 cm) Weight: 175 lb 12.8 oz (79.742 kg) IBW/kg (Calculated) : 61.5   Vital Signs: Temp: 98.4 F (36.9 C) (02/01 0528) Temp src: Oral (02/01 0528) BP: 116/76 mmHg (02/01 0918) Pulse Rate: 96  (02/01 0528)  Labs:  Basename 01/20/12 0435 01/19/12 0436 01/18/12 1020  HGB -- 10.9* 10.4*  HCT -- 33.9* 33.3*  PLT -- 217 222  APTT -- -- --  LABPROT 14.1 14.3 13.8  INR 1.07 1.09 1.04  HEPARINUNFRC -- -- --  CREATININE -- 0.83 0.90  CKTOTAL -- -- --  CKMB -- -- --  TROPONINI -- -- --   Estimated Creatinine Clearance: 69.1 ml/min (by C-G formula based on Cr of 0.83).   Assessment:  82 YOM admit with new DVT in the setting of non small cell lung cancer.  CT angio 1/31 negative for PE. Warfarin points: 7  No CBC today, no bleeding/complications reported.  No change in INR s/p 2 doses of Coumadin 7.5 mg po  MD aware of CHEST guidelines recommendation for Lovenox (rather than Coumadin) in oncology patient and would like to proceed with Coumadin outpatient.  Goal of Therapy:  INR 2-3 Appropriate renal dosing of lovenox   Plan:   Continue Lovenox 1.5 mg/kg once daily (120 mg daily)  Will boost with 10 mg po x 1 tonight.  Geoffry Paradise Thi 01/20/2012,11:23 AM

## 2012-01-25 ENCOUNTER — Ambulatory Visit: Payer: Medicare Other | Admitting: Internal Medicine

## 2012-01-25 ENCOUNTER — Other Ambulatory Visit (HOSPITAL_BASED_OUTPATIENT_CLINIC_OR_DEPARTMENT_OTHER): Payer: Medicare Other

## 2012-01-25 ENCOUNTER — Ambulatory Visit (HOSPITAL_BASED_OUTPATIENT_CLINIC_OR_DEPARTMENT_OTHER): Payer: Medicare Other

## 2012-01-25 ENCOUNTER — Ambulatory Visit (HOSPITAL_BASED_OUTPATIENT_CLINIC_OR_DEPARTMENT_OTHER): Payer: Self-pay | Admitting: Pharmacist

## 2012-01-25 ENCOUNTER — Other Ambulatory Visit: Payer: Self-pay | Admitting: Pharmacist

## 2012-01-25 VITALS — BP 141/87 | HR 116 | Temp 97.3°F | Ht 65.0 in | Wt 179.6 lb

## 2012-01-25 DIAGNOSIS — I82409 Acute embolism and thrombosis of unspecified deep veins of unspecified lower extremity: Secondary | ICD-10-CM

## 2012-01-25 DIAGNOSIS — C3490 Malignant neoplasm of unspecified part of unspecified bronchus or lung: Secondary | ICD-10-CM

## 2012-01-25 DIAGNOSIS — Z5111 Encounter for antineoplastic chemotherapy: Secondary | ICD-10-CM

## 2012-01-25 LAB — CBC WITH DIFFERENTIAL/PLATELET
BASO%: 0.8 % (ref 0.0–2.0)
EOS%: 3.6 % (ref 0.0–7.0)
HCT: 36.1 % — ABNORMAL LOW (ref 38.4–49.9)
MCH: 29.1 pg (ref 27.2–33.4)
MCHC: 32.6 g/dL (ref 32.0–36.0)
NEUT%: 58.6 % (ref 39.0–75.0)
lymph#: 1.5 10*3/uL (ref 0.9–3.3)

## 2012-01-25 LAB — COMPREHENSIVE METABOLIC PANEL
ALT: 18 U/L (ref 0–53)
AST: 20 U/L (ref 0–37)
Alkaline Phosphatase: 71 U/L (ref 39–117)
Chloride: 101 mEq/L (ref 96–112)
Creatinine, Ser: 1.02 mg/dL (ref 0.50–1.35)
Total Bilirubin: 0.3 mg/dL (ref 0.3–1.2)

## 2012-01-25 LAB — PROTIME-INR
INR: 2.2 (ref 2.00–3.50)
Protime: 26.4 Seconds — ABNORMAL HIGH (ref 10.6–13.4)

## 2012-01-25 LAB — POCT INR: INR: 2.2

## 2012-01-25 MED ORDER — SODIUM CHLORIDE 0.9 % IJ SOLN
10.0000 mL | INTRAMUSCULAR | Status: DC | PRN
  Administered 2012-01-25: 10 mL
  Filled 2012-01-25: qty 10

## 2012-01-25 MED ORDER — SODIUM CHLORIDE 0.9 % IV SOLN
Freq: Once | INTRAVENOUS | Status: AC
  Administered 2012-01-25: 13:00:00 via INTRAVENOUS

## 2012-01-25 MED ORDER — DEXAMETHASONE SODIUM PHOSPHATE 10 MG/ML IJ SOLN
10.0000 mg | Freq: Once | INTRAMUSCULAR | Status: AC
  Administered 2012-01-25: 10 mg via INTRAVENOUS

## 2012-01-25 MED ORDER — SODIUM CHLORIDE 0.9 % IV SOLN
500.0000 mg/m2 | Freq: Once | INTRAVENOUS | Status: AC
  Administered 2012-01-25: 1000 mg via INTRAVENOUS
  Filled 2012-01-25: qty 40

## 2012-01-25 MED ORDER — HEPARIN SOD (PORK) LOCK FLUSH 100 UNIT/ML IV SOLN
500.0000 [IU] | Freq: Once | INTRAVENOUS | Status: AC | PRN
  Administered 2012-01-25: 500 [IU]
  Filled 2012-01-25: qty 5

## 2012-01-25 MED ORDER — WARFARIN SODIUM 5 MG PO TABS: ORAL_TABLET | ORAL | Status: DC

## 2012-01-25 MED ORDER — ONDANSETRON 8 MG/50ML IVPB (CHCC)
8.0000 mg | Freq: Once | INTRAVENOUS | Status: AC
  Administered 2012-01-25: 8 mg via INTRAVENOUS

## 2012-01-25 NOTE — Progress Notes (Signed)
[facility] OFFICE PROGRESS NOTE  Dorrene German, MD, MD [address] [place] [address]  DIAGNOSIS: Metastatic non-small cell lung cancer, adenocarcinoma, diagnosed in June 2012.   PRIOR THERAPY:  :  1. Status post left Pleurx catheter placement by Dr. Edwyna Shell on February 08, 2011 for drainage of left sided pleural effusion. 2. Status post 4 cycles of induction chemotherapy with carboplatin for AUC of 6, paclitaxel 200 mg per meter square and Avastin 15 mg/kg given every 3 weeks according to the ECOG protocol 5508 with stable disease. 3. Status post 1 cycle of maintenance Avastin 15 mg/kg given on May 19, 2011, discontinued secondary to gastrointestinal bleed.  CURRENT THERAPY: Alimta 500 mg/m2 every 3 weeks. Status post 3 cycles   INTERVAL HISTORY: Johnathan Bowman 78 y.o. male returns to the clinic today for followup visit accompanied by his daughter. The patient is feeling fine today except for swelling of the left lower extremity. He was recently admitted to [facility] and was found to have deep venous thrombosis of the left lower extremity. He is currently on Coumadin 10 mg by mouth daily. The patient has a CT angiogram of the chest performed on 01/19/2012 during his admission. It showed no evidence for pulmonary emboli. It also showed stable disease except for mildly enlarged AP window lymph node which currently measures 1.4 CM compared to 1.2 CM on the previous scan. The patient is feeling much better today and he is here to start cycle #4 of his chemotherapy. He still has some shortness of breath with exertion.  MEDICAL HISTORY: Past Medical History  Diagnosis Date  . Hypertension   . Diabetes mellitus   . Dyslipidemia   . lung ca 11/12/2010  . Mini stroke   . Degenerative disc disease   . GERD (gastroesophageal reflux disease)   . Osteopenia 2005  . Coronary artery disease 2005    treated medically  . BPH (benign prostatic  hyperplasia)     ALLERGIES:  is allergic to penicillins.  MEDICATIONS:  Current Outpatient Prescriptions  Medication Sig Dispense Refill  . acetaminophen (TYLENOL) 500 MG tablet Take 500 mg by mouth every 6 (six) hours as needed. For pain.      Marland Kitchen amLODipine-benazepril (LOTREL) 5-10 MG per capsule Take 1 capsule by mouth daily.        Marland Kitchen dexamethasone (DECADRON) 4 MG tablet Take 4 mg by mouth 2 (two) times daily with a meal. 1 tab po bid, the day before, the day of, and the day after chemotherapy       . folic acid (FOLVITE) 1 MG tablet Take 1 tablet (1 mg total) by mouth daily.  30 tablet  5  . glyBURIDE-metformin (GLUCOVANCE) 5-500 MG per tablet Take 2 tablets by mouth 2 (two) times daily.        Marland Kitchen lidocaine-prilocaine (EMLA) cream Apply 1 application topically as needed. Apply to [facility] A Cath site before chemotherapy.       . pantoprazole (PROTONIX) 40 MG tablet Take 1 tablet (40 mg total) by mouth 2 (two) times daily.  30 tablet  3  . PEMEtrexed Disodium (ALIMTA IV) Inject 1,000 mg into the vein.      Marland Kitchen warfarin (COUMADIN) 5 MG tablet Take 1 tablet (5 mg total) by mouth daily.  15 tablet  0  . aspirin 81 MG tablet Take 81 mg by mouth daily.       . CYANOCOBALAMIN IJ Inject  1,000 mcg as directed once.       . enoxaparin (LOVENOX) 120 MG/0.8ML SOLN Inject 0.8 mLs (120 mg total) into the skin daily.  12.6 mL  4  . HYDROcodone-acetaminophen (VICODIN) 5-500 MG per tablet Take 1 tablet by mouth every 6 (six) hours as needed. For pain.       No current facility-administered medications for this visit.   Facility-Administered Medications Ordered in Other Visits  Medication Dose Route Frequency Provider Last Rate Last Dose  . sodium chloride 0.9 % injection 10 mL  10 mL Intracatheter PRN Ohana Birdwell K. Lawren Sexson, MD   10 mL at 01/04/12 1008    SURGICAL HISTORY:  Past Surgical History  Procedure Date  . No past surgeries     REVIEW OF SYSTEMS:  A comprehensive review of systems was negative  except for: Constitutional: positive for fatigue Respiratory: positive for dyspnea on exertion   PHYSICAL EXAMINATION: General appearance: alert, cooperative and no distress Head: Normocephalic, without obvious abnormality, atraumatic Neck: no adenopathy Resp: clear to auscultation bilaterally Cardio: regular rate and rhythm, S1, S2 normal, no murmur, click, rub or gallop GI: soft, non-tender; bowel sounds normal; no masses,  no organomegaly Extremities: edema 1+ LLE Neurologic: Alert and oriented X 3, normal strength and tone. Normal symmetric reflexes. Normal coordination and gait  ECOG PERFORMANCE STATUS: 1 - Symptomatic but completely ambulatory  Blood pressure 141/87, pulse 116, temperature 97.3 F (36.3 C), temperature source Oral, height 5\' 5"  (1.651 m), weight 179 lb 9.6 oz (81.466 kg).  LABORATORY DATA: Lab Results  Component Value Date   WBC 6.1 01/25/2012   HGB 11.8* 01/25/2012   HCT 36.1* 01/25/2012   MCV 89.1 01/25/2012   PLT 352 01/25/2012      Chemistry      Component Value Date/Time   NA 134* 01/19/2012 0436   NA 139 11/07/2011 1621   K 4.4 01/19/2012 0436   K 4.8* 11/07/2011 1621   CL 100 01/19/2012 0436   CL 97* 11/07/2011 1621   CO2 23 01/19/2012 0436   CO2 30 11/07/2011 1621   BUN 10 01/19/2012 0436   BUN 11 11/07/2011 1621   CREATININE 0.83 01/19/2012 0436   CREATININE 0.9 11/07/2011 1621      Component Value Date/Time   CALCIUM 9.2 01/19/2012 0436   CALCIUM 9.4 11/07/2011 1621   ALKPHOS 77 01/19/2012 0436   ALKPHOS 100* 11/07/2011 1621   AST 20 01/19/2012 0436   AST 21 11/07/2011 1621   ALT 15 01/19/2012 0436   BILITOT 0.2* 01/19/2012 0436   BILITOT 0.50 11/07/2011 1621       RADIOGRAPHIC STUDIES: Dg Chest 2 View  01/18/2012  *RADIOLOGY REPORT*  Clinical Data: Shortness of breath, leg swelling.  CHEST - 2 VIEW  Comparison: 12/26/2011  Findings: Right Port-A-Cath remains in place, unchanged. Cardiomegaly.  Left lower lobe atelectasis with small left effusion  again noted, unchanged.  No confluent opacity on the right. No acute bony abnormality.  IMPRESSION: No change since prior study.  Original Report Authenticated By: Cyndie Chime, M.D.   Dg Chest 2 View  12/26/2011  *RADIOLOGY REPORT*  Clinical Data: Shortness of breath.  Fever.  CHEST - 2 VIEW  Comparison: 11/07/2011  Findings: Port-A-Cath tip projects over the SVC.  Blunting left costophrenic angle noted with associated passive atelectasis in the left lower lobe.  There are no pleural calcifications in this vicinity.  Underlying emphysema noted. Mild right perihilar subsegmental atelectasis noted.  There is mild tortuosity of  the thoracic aorta, with atherosclerotic calcification.  IMPRESSION:  1.  Small left pleural effusion with scarring or passive atelectasis in the left lower lobe. The left lower lobe opacity appears improved compared to 11/07/2011. 2.  Low lung volumes with minimal right perihilar subsegmental atelectasis.  Original Report Authenticated By: Dellia Cloud, M.D.   Ct Angio Chest W/cm &/or Wo Cm  01/19/2012  *RADIOLOGY REPORT*  Clinical Data: Pulmonary embolism.  Deep venous thrombosis.Lung cancer.  CT ANGIOGRAPHY CHEST  Technique:  Multidetector CT imaging of the chest using the standard protocol during bolus administration of intravenous contrast. Multiplanar reconstructed images including MIPs were obtained and reviewed to evaluate the vascular anatomy.  Contrast: OMNIPAQUE IOHEXOL 300 MG/ML IV SOLN  Comparison: 11/07/2011 chest CT.  Findings: Coronary artery atherosclerosis is present. If office based assessment of coronary risk factors has not been performed, it is now recommended.  Technically adequate study.  Negative for pulmonary embolus.  Diffuse thoracic esophageal thickening, slightly more prominent superiorly, suggesting esophagitis which may be associated with radiation.  AP window lymph node is enlarged, measuring 14 mm short axis, previously 12 mm.  No other  mediastinal or hilar adenopathy is identified.  Small left pleural effusion is present which layers dependently.  The high density material is present in the posterior left hemithorax which may be associated with prior pleura these cysts or pleural plaque. Incidental imaging of the upper abdomen demonstrates large amount of food in the stomach.  Stable low density lesions in the left hepatic lobe.  Visualized right adrenal gland appears within normal limits.  Fat containing hiatal hernia.  Aortic and branch vessel atherosclerosis.  Right IJ Port-A-Cath is present with the tip terminating at the cavoatrial junction.  Elevation of the right hemidiaphragm.  The lung windows demonstrate dependent atelectasis and left lower lobe collapse / consolidation, which is a chronic finding in this patient.  Subpleural nodules in the posterior left upper lobe appears little changed allowing for the decreased pleural effusion.  IMPRESSION: 1.  Technically adequate study without pulmonary embolus. 2.  Thickening of the thoracic esophagus suggesting esophagitis. 3.  Enlargement of the AP window lymph node, measuring 14 mm short axis, increased from 12 mm short axis on 11/07/2011. 4.  Atherosclerosis, emphysema and coronary artery disease. 5.  Scattered left upper lobe subpleural nodules appear little changed. 6.  Decreased left pleural effusion.  Improved collapse / consolidation of the left lower lobe.  High density material suggests previous pleurodesis in the left pleural space.  Original Report Authenticated By: Andreas Newport, M.D.    ASSESSMENT: This is a very pleasant 78 years old Hispanic male with metastatic non-small cell lung cancer currently on maintenance chemotherapy with single agent Alimta status post 3 cycles. The patient is doing fine and he has no significant evidence for disease progression except for the AP window lymph node which increased by 2 mm. I discussed the scan results with the patient and his  daughter.   PLAN:  #1 the patient will continue on maintenance chemotherapy with Alimta as scheduled. He'll receive cycle #4 today. #2 For the recently diagnosed deep venous thrombosis of the left lower extremity, I referred the patient to the Coumadin clinic at the [facility] for adjustment of his Coumadin dose. #3 the patient would come back for followup visit in 3 weeks with the next cycle of his chemotherapy. He was advised to call me immediately if he has any concerning symptoms in the interval.  All questions  were answered. The patient knows to call the clinic with any problems, questions or concerns. We can certainly see the patient much sooner if necessary.

## 2012-01-25 NOTE — Progress Notes (Signed)
Newly dx DVT in this 78 yo male w/ NSCLC. INR goal = 2-3 Planned duration of anticoagulation = 6 months Started anticoagulation at [facility] on 01/18/12; Lovenox 80 mg x 1 started in ED then transitioned to Lovenox 120 mg/day.  He took dose #7 last night. Previous doses of Coumadin are as follows: 01/18/12 - 7.5 mg 01/19/12 - 7.5 mg 01/20/12 - 01/24/12 - 10 mg/day Pt was previously being followed at [facility] & had his INR checked on Monday (2/4).  Pt states his MD there told him his blood was "too thick" & was instructed to take 10 mg on 2/4 & 2/5.  The plan was for a home RN to come check his INR at his house today but since pt was referred to our clinic & was already here for chemo, I went ahead & had lab come get INR in the infusion room today. INR today = 2.2. Pt will d/c Lovenox & stay on Warfarin 10 mg/day. I phoned in refill to [facility] for more Warfarin today as pt said he only got 15 tabs from the MD at [facility]. Recheck INR on 02/01/12.   Pharmacist: on 02/01/12, please ask pt if he is interested in talking to one of our finance assistants Alcario Drought) to file for indigent assistance.  This way we will know if we should "no charge" this pt in the future for Coumadin clinic visits. Marily Lente, Pharm.D.

## 2012-01-25 NOTE — Patient Instructions (Signed)
No complaints, pt aware of future appts.  SLJ

## 2012-01-26 ENCOUNTER — Telehealth: Payer: Self-pay | Admitting: Internal Medicine

## 2012-01-26 NOTE — Telephone Encounter (Signed)
called pt with 2/27 appt and confirmed lab coag appt on 2/13   aom

## 2012-01-30 ENCOUNTER — Other Ambulatory Visit: Payer: Self-pay | Admitting: Pharmacist

## 2012-01-30 DIAGNOSIS — I82409 Acute embolism and thrombosis of unspecified deep veins of unspecified lower extremity: Secondary | ICD-10-CM

## 2012-02-01 ENCOUNTER — Ambulatory Visit: Payer: Medicare Other | Admitting: Pharmacist

## 2012-02-01 ENCOUNTER — Other Ambulatory Visit (HOSPITAL_BASED_OUTPATIENT_CLINIC_OR_DEPARTMENT_OTHER): Payer: Medicare Other | Admitting: Lab

## 2012-02-01 DIAGNOSIS — I82409 Acute embolism and thrombosis of unspecified deep veins of unspecified lower extremity: Secondary | ICD-10-CM

## 2012-02-01 NOTE — Progress Notes (Signed)
INR supratherapeutic today.  Pt is asymptomatic. Pt already took his Coumadin today (takes at 9 am every morning). Hold x 1 dose tomorrow then 10 mg daily except 7.5 mg on Fri & Mon. We discussed vit K & diet. Return 1 week. Pt went to speak w/ Terald Sleeper, finance assistant today after our visit to determine if he is eligible for indigent status & no charge for our coumadin clinic. Marily Lente, Pharm.D.

## 2012-02-07 ENCOUNTER — Other Ambulatory Visit: Payer: Self-pay | Admitting: Pharmacist

## 2012-02-08 ENCOUNTER — Ambulatory Visit (HOSPITAL_BASED_OUTPATIENT_CLINIC_OR_DEPARTMENT_OTHER): Payer: Medicare Other | Admitting: Pharmacist

## 2012-02-08 ENCOUNTER — Other Ambulatory Visit (HOSPITAL_BASED_OUTPATIENT_CLINIC_OR_DEPARTMENT_OTHER): Payer: Medicare Other | Admitting: Lab

## 2012-02-08 DIAGNOSIS — I82409 Acute embolism and thrombosis of unspecified deep veins of unspecified lower extremity: Secondary | ICD-10-CM

## 2012-02-08 DIAGNOSIS — C3490 Malignant neoplasm of unspecified part of unspecified bronchus or lung: Secondary | ICD-10-CM

## 2012-02-08 DIAGNOSIS — Z7901 Long term (current) use of anticoagulants: Secondary | ICD-10-CM

## 2012-02-08 LAB — COMPREHENSIVE METABOLIC PANEL
Albumin: 3.5 g/dL (ref 3.5–5.2)
Alkaline Phosphatase: 69 U/L (ref 39–117)
BUN: 15 mg/dL (ref 6–23)
CO2: 26 mEq/L (ref 19–32)
Glucose, Bld: 251 mg/dL — ABNORMAL HIGH (ref 70–99)
Potassium: 4.5 mEq/L (ref 3.5–5.3)
Total Bilirubin: 0.3 mg/dL (ref 0.3–1.2)
Total Protein: 6.1 g/dL (ref 6.0–8.3)

## 2012-02-08 LAB — PROTIME-INR
INR: 4.7 — ABNORMAL HIGH (ref 2.00–3.50)
Protime: 56.4 Seconds — ABNORMAL HIGH (ref 10.6–13.4)

## 2012-02-08 LAB — CBC WITH DIFFERENTIAL/PLATELET
Basophils Absolute: 0 10*3/uL (ref 0.0–0.1)
EOS%: 1.9 % (ref 0.0–7.0)
Eosinophils Absolute: 0.1 10*3/uL (ref 0.0–0.5)
HCT: 35.9 % — ABNORMAL LOW (ref 38.4–49.9)
HGB: 11.3 g/dL — ABNORMAL LOW (ref 13.0–17.1)
MCH: 28.7 pg (ref 27.2–33.4)
MCV: 91.1 fL (ref 79.3–98.0)
MONO%: 19.2 % — ABNORMAL HIGH (ref 0.0–14.0)
NEUT#: 2.3 10*3/uL (ref 1.5–6.5)
NEUT%: 48 % (ref 39.0–75.0)

## 2012-02-08 NOTE — Progress Notes (Signed)
INR supratherapeutic (4.7).  No problems with bleeding or bruising.  Patient has great difficulty remembering what dose of Coumadin he has been taking over the last week.  He describes some abstract combination of 1, 1.5, and 2 tablets over the last week.  However, he does state that he refers back to our handout, and takes his medicine without fail at 9am every morning.  It is likely that his compliance with be markedly improved if we limit his dosing regimen to the same dose every day.  Since he already taken 7.5mg  of Coumadin this morning, I instructed the patient to hold his dose tomorrow, then resume Coumadin at 7.5mg  daily.  Will recheck INR in 1 week.  Pt communicated understanding of the new plan.

## 2012-02-15 ENCOUNTER — Other Ambulatory Visit: Payer: Self-pay | Admitting: Internal Medicine

## 2012-02-15 ENCOUNTER — Telehealth: Payer: Self-pay | Admitting: Internal Medicine

## 2012-02-15 ENCOUNTER — Encounter: Payer: Self-pay | Admitting: Physician Assistant

## 2012-02-15 ENCOUNTER — Ambulatory Visit (HOSPITAL_BASED_OUTPATIENT_CLINIC_OR_DEPARTMENT_OTHER): Payer: Medicare Other | Admitting: Physician Assistant

## 2012-02-15 ENCOUNTER — Ambulatory Visit (HOSPITAL_BASED_OUTPATIENT_CLINIC_OR_DEPARTMENT_OTHER): Payer: Medicare Other

## 2012-02-15 ENCOUNTER — Ambulatory Visit: Payer: Medicare Other | Admitting: Pharmacist

## 2012-02-15 ENCOUNTER — Other Ambulatory Visit: Payer: Medicare Other | Admitting: Lab

## 2012-02-15 DIAGNOSIS — I82409 Acute embolism and thrombosis of unspecified deep veins of unspecified lower extremity: Secondary | ICD-10-CM

## 2012-02-15 DIAGNOSIS — C3490 Malignant neoplasm of unspecified part of unspecified bronchus or lung: Secondary | ICD-10-CM

## 2012-02-15 DIAGNOSIS — Z5111 Encounter for antineoplastic chemotherapy: Secondary | ICD-10-CM

## 2012-02-15 DIAGNOSIS — K219 Gastro-esophageal reflux disease without esophagitis: Secondary | ICD-10-CM

## 2012-02-15 DIAGNOSIS — Z7901 Long term (current) use of anticoagulants: Secondary | ICD-10-CM

## 2012-02-15 LAB — COMPREHENSIVE METABOLIC PANEL
AST: 19 U/L (ref 0–37)
Alkaline Phosphatase: 73 U/L (ref 39–117)
Glucose, Bld: 108 mg/dL — ABNORMAL HIGH (ref 70–99)
Potassium: 4.4 mEq/L (ref 3.5–5.3)
Sodium: 142 mEq/L (ref 135–145)
Total Bilirubin: 0.3 mg/dL (ref 0.3–1.2)
Total Protein: 6.6 g/dL (ref 6.0–8.3)

## 2012-02-15 LAB — PROTIME-INR: INR: 3.3 (ref 2.00–3.50)

## 2012-02-15 LAB — CBC WITH DIFFERENTIAL/PLATELET
EOS%: 2.6 % (ref 0.0–7.0)
Eosinophils Absolute: 0.2 10*3/uL (ref 0.0–0.5)
LYMPH%: 28.3 % (ref 14.0–49.0)
MCH: 28.6 pg (ref 27.2–33.4)
MCHC: 31.1 g/dL — ABNORMAL LOW (ref 32.0–36.0)
MCV: 91.8 fL (ref 79.3–98.0)
MONO%: 14.1 % — ABNORMAL HIGH (ref 0.0–14.0)
NEUT#: 3.1 10*3/uL (ref 1.5–6.5)
Platelets: 325 10*3/uL (ref 140–400)
RBC: 4.13 10*6/uL — ABNORMAL LOW (ref 4.20–5.82)
RDW: 18.2 % — ABNORMAL HIGH (ref 11.0–14.6)

## 2012-02-15 MED ORDER — SODIUM CHLORIDE 0.9 % IV SOLN
500.0000 mg/m2 | Freq: Once | INTRAVENOUS | Status: AC
  Administered 2012-02-15: 1000 mg via INTRAVENOUS
  Filled 2012-02-15: qty 40

## 2012-02-15 MED ORDER — ONDANSETRON 8 MG/50ML IVPB (CHCC)
8.0000 mg | Freq: Once | INTRAVENOUS | Status: AC
  Administered 2012-02-15: 8 mg via INTRAVENOUS

## 2012-02-15 MED ORDER — SODIUM CHLORIDE 0.9 % IV SOLN
Freq: Once | INTRAVENOUS | Status: AC
  Administered 2012-02-15: 11:00:00 via INTRAVENOUS

## 2012-02-15 MED ORDER — DEXAMETHASONE SODIUM PHOSPHATE 10 MG/ML IJ SOLN
10.0000 mg | Freq: Once | INTRAMUSCULAR | Status: AC
  Administered 2012-02-15: 10 mg via INTRAVENOUS

## 2012-02-15 MED ORDER — SODIUM CHLORIDE 0.9 % IJ SOLN
10.0000 mL | INTRAMUSCULAR | Status: DC | PRN
  Administered 2012-02-15: 10 mL
  Filled 2012-02-15: qty 10

## 2012-02-15 MED ORDER — PANTOPRAZOLE SODIUM 40 MG PO TBEC: 40.0000 mg | DELAYED_RELEASE_TABLET | Freq: Two times a day (BID) | ORAL | Status: DC

## 2012-02-15 MED ORDER — HEPARIN SOD (PORK) LOCK FLUSH 100 UNIT/ML IV SOLN
500.0000 [IU] | Freq: Once | INTRAVENOUS | Status: AC | PRN
  Administered 2012-02-15: 500 [IU]
  Filled 2012-02-15: qty 5

## 2012-02-15 NOTE — Progress Notes (Signed)
[facility] OFFICE PROGRESS NOTE  Dorrene German, MD, MD [address] [place] [address]  DIAGNOSIS: Metastatic non-small cell lung cancer, adenocarcinoma, diagnosed in June 2012.   PRIOR THERAPY:  :  1. Status post left Pleurx catheter placement by Dr. Edwyna Shell on February 08, 2011 for drainage of left sided pleural effusion. 2. Status post 4 cycles of induction chemotherapy with carboplatin for AUC of 6, paclitaxel 200 mg per meter square and Avastin 15 mg/kg given every 3 weeks according to the ECOG protocol 5508 with stable disease. 3. Status post 1 cycle of maintenance Avastin 15 mg/kg given on May 19, 2011, discontinued secondary to gastrointestinal bleed.  CURRENT THERAPY: Alimta 500 mg/m2 every 3 weeks. Status post 4 cycles   INTERVAL HISTORY: Johnathan Bowman 78 y.o. male returns to the clinic today for followup visit accompanied by his daughter. The patient is feeling fine today except for continued swelling of the left lower extremity due to deep venous thrombosis of the left lower extremity. His Coumadin therapy as managed by the [facility] Coumadin clinic He still has some shortness of breath with exertion. Voiced no other complaints. He requests a refill for his Protonix but would like to change to the [employer].  MEDICAL HISTORY: Past Medical History  Diagnosis Date  . Hypertension   . Diabetes mellitus   . Dyslipidemia   . lung ca 11/12/2010  . Mini stroke   . Degenerative disc disease   . GERD (gastroesophageal reflux disease)   . Osteopenia 2005  . Coronary artery disease 2005    treated medically  . BPH (benign prostatic hyperplasia)     ALLERGIES:  is allergic to penicillins.  MEDICATIONS:  Current Outpatient Prescriptions  Medication Sig Dispense Refill  . acetaminophen (TYLENOL) 500 MG tablet Take 500 mg by mouth every 6 (six) hours as needed. For pain.      Marland Kitchen amLODipine-benazepril  (LOTREL) 5-10 MG per capsule Take 1 capsule by mouth daily.        Marland Kitchen aspirin 81 MG tablet Take 81 mg by mouth daily.       . CYANOCOBALAMIN IJ Inject 1,000 mcg as directed once.       Marland Kitchen dexamethasone (DECADRON) 4 MG tablet Take 4 mg by mouth 2 (two) times daily with a meal. 1 tab po bid, the day before, the day of, and the day after chemotherapy       . folic acid (FOLVITE) 1 MG tablet Take 1 tablet (1 mg total) by mouth daily.  30 tablet  5  . glyBURIDE-metformin (GLUCOVANCE) 5-500 MG per tablet Take 2 tablets by mouth 2 (two) times daily.        Marland Kitchen HYDROcodone-acetaminophen (VICODIN) 5-500 MG per tablet Take 1 tablet by mouth every 6 (six) hours as needed. For pain.      Marland Kitchen lidocaine-prilocaine (EMLA) cream Apply 1 application topically as needed. Apply to [facility] Cath site before chemotherapy.       . pantoprazole (PROTONIX) 40 MG tablet Take 1 tablet (40 mg total) by mouth 2 (two) times daily.  30 tablet  3  . warfarin (COUMADIN) 5 MG tablet 10 mg daily by mouth or as directed.  60 tablet  4   No current facility-administered medications for this visit.   Facility-Administered Medications Ordered in Other Visits  Medication Dose Route Frequency Provider Last Rate Last Dose  . 0.9 %  sodium  chloride infusion   Intravenous Once Tiana Loft, PA      . dexamethasone (DECADRON) injection 10 mg  10 mg Intravenous Once Tiana Loft, PA   10 mg at 02/15/12 1051  . heparin lock flush 100 unit/mL  500 Units Intracatheter Once PRN Tiana Loft, PA   500 Units at 02/15/12 1135  . ondansetron (ZOFRAN) IVPB 8 mg  8 mg Intravenous Once Tiana Loft, PA   8 mg at 02/15/12 1051  . PEMEtrexed (ALIMTA) 1,000 mg in sodium chloride 0.9 % 100 mL chemo infusion  500 mg/m2 (Treatment Plan Actual) Intravenous Once Tiana Loft, PA   1,000 mg at 02/15/12 1117  . sodium chloride 0.9 % injection 10 mL  10 mL Intracatheter PRN Mohamed K. Mohamed, MD   10 mL at 01/04/12 1008  . DISCONTD: sodium chloride 0.9  % injection 10 mL  10 mL Intracatheter PRN Tiana Loft, PA   10 mL at 02/15/12 1135    SURGICAL HISTORY:  Past Surgical History  Procedure Date  . No past surgeries     REVIEW OF SYSTEMS:  A comprehensive review of systems was negative except for: Constitutional: positive for fatigue Respiratory: positive for dyspnea on exertion   PHYSICAL EXAMINATION: General appearance: alert, cooperative and no distress Head: Normocephalic, without obvious abnormality, atraumatic Neck: no adenopathy Resp: clear to auscultation bilaterally Cardio: regular rate and rhythm, S1, S2 normal, no murmur, click, rub or gallop GI: soft, non-tender; bowel sounds normal; no masses,  no organomegaly Extremities: edema 1+ LLE Neurologic: Alert and oriented X 3, normal strength and tone. Normal symmetric reflexes. Normal coordination and gait  ECOG PERFORMANCE STATUS: 1 - Symptomatic but completely ambulatory  Blood pressure 143/83, pulse 81, temperature 97.9 F (36.6 C), temperature source Oral, height 5\' 5"  (1.651 m), weight 185 lb 9.6 oz (84.188 kg).  LABORATORY DATA: Lab Results  Component Value Date   WBC 5.7 02/15/2012   HGB 11.8* 02/15/2012   HCT 37.9* 02/15/2012   MCV 91.8 02/15/2012   PLT 325 02/15/2012      Chemistry      Component Value Date/Time   NA 138 02/08/2012 0934   NA 139 11/07/2011 1621   K 4.5 02/08/2012 0934   K 4.8* 11/07/2011 1621   CL 104 02/08/2012 0934   CL 97* 11/07/2011 1621   CO2 26 02/08/2012 0934   CO2 30 11/07/2011 1621   BUN 15 02/08/2012 0934   BUN 11 11/07/2011 1621   CREATININE 1.10 02/08/2012 0934   CREATININE 0.9 11/07/2011 1621      Component Value Date/Time   CALCIUM 8.9 02/08/2012 0934   CALCIUM 9.4 11/07/2011 1621   ALKPHOS 69 02/08/2012 0934   ALKPHOS 100* 11/07/2011 1621   AST 16 02/08/2012 0934   AST 21 11/07/2011 1621   ALT 11 02/08/2012 0934   BILITOT 0.3 02/08/2012 0934   BILITOT 0.50 11/07/2011 1621       RADIOGRAPHIC STUDIES: Dg Chest 2  View  01/18/2012  *RADIOLOGY REPORT*  Clinical Data: Shortness of breath, leg swelling.  CHEST - 2 VIEW  Comparison: 12/26/2011  Findings: Right Port-A-Cath remains in place, unchanged. Cardiomegaly.  Left lower lobe atelectasis with small left effusion again noted, unchanged.  No confluent opacity on the right. No acute bony abnormality.  IMPRESSION: No change since prior study.  Original Report Authenticated By: Cyndie Chime, M.D.   Dg Chest 2 View  12/26/2011  *RADIOLOGY REPORT*  Clinical Data: Shortness of breath.  Fever.  CHEST -  2 VIEW  Comparison: 11/07/2011  Findings: Port-A-Cath tip projects over the SVC.  Blunting left costophrenic angle noted with associated passive atelectasis in the left lower lobe.  There are no pleural calcifications in this vicinity.  Underlying emphysema noted. Mild right perihilar subsegmental atelectasis noted.  There is mild tortuosity of the thoracic aorta, with atherosclerotic calcification.  IMPRESSION:  1.  Small left pleural effusion with scarring or passive atelectasis in the left lower lobe. The left lower lobe opacity appears improved compared to 11/07/2011. 2.  Low lung volumes with minimal right perihilar subsegmental atelectasis.  Original Report Authenticated By: Dellia Cloud, M.D.   Ct Angio Chest W/cm &/or Wo Cm  01/19/2012  *RADIOLOGY REPORT*  Clinical Data: Pulmonary embolism.  Deep venous thrombosis.Lung cancer.  CT ANGIOGRAPHY CHEST  Technique:  Multidetector CT imaging of the chest using the standard protocol during bolus administration of intravenous contrast. Multiplanar reconstructed images including MIPs were obtained and reviewed to evaluate the vascular anatomy.  Contrast: OMNIPAQUE IOHEXOL 300 MG/ML IV SOLN  Comparison: 11/07/2011 chest CT.  Findings: Coronary artery atherosclerosis is present. If office based assessment of coronary risk factors has not been performed, it is now recommended.  Technically adequate study.  Negative  for pulmonary embolus.  Diffuse thoracic esophageal thickening, slightly more prominent superiorly, suggesting esophagitis which may be associated with radiation.  AP window lymph node is enlarged, measuring 14 mm short axis, previously 12 mm.  No other mediastinal or hilar adenopathy is identified.  Small left pleural effusion is present which layers dependently.  The high density material is present in the posterior left hemithorax which may be associated with prior pleura these cysts or pleural plaque. Incidental imaging of the upper abdomen demonstrates large amount of food in the stomach.  Stable low density lesions in the left hepatic lobe.  Visualized right adrenal gland appears within normal limits.  Fat containing hiatal hernia.  Aortic and branch vessel atherosclerosis.  Right IJ Port-A-Cath is present with the tip terminating at the cavoatrial junction.  Elevation of the right hemidiaphragm.  The lung windows demonstrate dependent atelectasis and left lower lobe collapse / consolidation, which is a chronic finding in this patient.  Subpleural nodules in the posterior left upper lobe appears little changed allowing for the decreased pleural effusion.  IMPRESSION: 1.  Technically adequate study without pulmonary embolus. 2.  Thickening of the thoracic esophagus suggesting esophagitis. 3.  Enlargement of the AP window lymph node, measuring 14 mm short axis, increased from 12 mm short axis on 11/07/2011. 4.  Atherosclerosis, emphysema and coronary artery disease. 5.  Scattered left upper lobe subpleural nodules appear little changed. 6.  Decreased left pleural effusion.  Improved collapse / consolidation of the left lower lobe.  High density material suggests previous pleurodesis in the left pleural space.  Original Report Authenticated By: Andreas Newport, M.D.    ASSESSMENT: This is a very pleasant 78 years old Hispanic male with metastatic non-small cell lung cancer currently on maintenance chemotherapy  with single agent Alimta status post 4 cycles. Overall he is doing well except for some disease progression in the AP window with a recent 2 mm lymph node increase in this area. The patient was discussed with Dr. Arbutus Ped. He will proceed with cycle #5 of his maintenance chemotherapy with single agent Alimta. He'll return in 3 weeks with repeat CBC differential and C. met prior to cycle #6. He will continue on Coumadin for the instruction of the [facility].  A refill for his protonic 40 mg by mouth once daily total 30 with 3 chills was sent via E. described to the [facility] outpatient pharmacy per his request.   Conni Slipper, PA-C   All questions were answered. The patient knows to call the clinic with any problems, questions or concerns. We can certainly see the patient much sooner if necessary.

## 2012-02-15 NOTE — Progress Notes (Signed)
INR today slightly supratherapeutic (3.3) after decreasing pt's dose last week to 7.5mg  daily.  No missed doses.  No changes in meds or diet.  No complaints besides some persistent swelling around the area of the clot.  Will continue current dose, and recheck INR in 1 week.

## 2012-02-15 NOTE — Telephone Encounter (Signed)
Spoke with patients daughter about EPP application, patient turned in [place] application 02/15/12

## 2012-02-15 NOTE — Telephone Encounter (Signed)
Gv pt appt for march2013 °

## 2012-02-16 ENCOUNTER — Encounter: Payer: Self-pay | Admitting: Internal Medicine

## 2012-02-16 NOTE — Progress Notes (Signed)
Patient approved for 100% EPP discount, for family of 2, income [phone]

## 2012-02-22 ENCOUNTER — Other Ambulatory Visit: Payer: Self-pay | Admitting: Physician Assistant

## 2012-02-22 ENCOUNTER — Other Ambulatory Visit (HOSPITAL_BASED_OUTPATIENT_CLINIC_OR_DEPARTMENT_OTHER): Payer: Medicare Other | Admitting: Lab

## 2012-02-22 ENCOUNTER — Telehealth: Payer: Self-pay | Admitting: Internal Medicine

## 2012-02-22 ENCOUNTER — Ambulatory Visit (HOSPITAL_COMMUNITY)
Admission: RE | Admit: 2012-02-22 | Discharge: 2012-02-22 | Disposition: A | Discharge status: Home / Self Care | Payer: Medicare Other | Source: Ambulatory Visit | Attending: Physician Assistant | Admitting: Physician Assistant

## 2012-02-22 ENCOUNTER — Ambulatory Visit (HOSPITAL_BASED_OUTPATIENT_CLINIC_OR_DEPARTMENT_OTHER): Payer: Medicare Other | Admitting: Pharmacist

## 2012-02-22 DIAGNOSIS — I82409 Acute embolism and thrombosis of unspecified deep veins of unspecified lower extremity: Secondary | ICD-10-CM

## 2012-02-22 DIAGNOSIS — R509 Fever, unspecified: Secondary | ICD-10-CM

## 2012-02-22 DIAGNOSIS — R609 Edema, unspecified: Secondary | ICD-10-CM

## 2012-02-22 DIAGNOSIS — M79609 Pain in unspecified limb: Secondary | ICD-10-CM

## 2012-02-22 DIAGNOSIS — M7989 Other specified soft tissue disorders: Secondary | ICD-10-CM | POA: Insufficient documentation

## 2012-02-22 DIAGNOSIS — C3490 Malignant neoplasm of unspecified part of unspecified bronchus or lung: Secondary | ICD-10-CM

## 2012-02-22 DIAGNOSIS — I824Y9 Acute embolism and thrombosis of unspecified deep veins of unspecified proximal lower extremity: Secondary | ICD-10-CM | POA: Insufficient documentation

## 2012-02-22 DIAGNOSIS — Z7901 Long term (current) use of anticoagulants: Secondary | ICD-10-CM

## 2012-02-22 LAB — CBC WITH DIFFERENTIAL/PLATELET
BASO%: 0.5 % (ref 0.0–2.0)
Eosinophils Absolute: 0.1 10*3/uL (ref 0.0–0.5)
MONO#: 0.3 10*3/uL (ref 0.1–0.9)
MONO%: 7.4 % (ref 0.0–14.0)
NEUT#: 2.2 10*3/uL (ref 1.5–6.5)
RBC: 3.78 10*6/uL — ABNORMAL LOW (ref 4.20–5.82)
RDW: 17.5 % — ABNORMAL HIGH (ref 11.0–14.6)
WBC: 3.7 10*3/uL — ABNORMAL LOW (ref 4.0–10.3)
nRBC: 0 % (ref 0–0)

## 2012-02-22 LAB — POCT INR: INR: 2.5

## 2012-02-22 LAB — COMPREHENSIVE METABOLIC PANEL
ALT: 16 U/L (ref 0–53)
AST: 22 U/L (ref 0–37)
CO2: 24 mEq/L (ref 19–32)
Chloride: 103 mEq/L (ref 96–112)
Sodium: 139 mEq/L (ref 135–145)
Total Bilirubin: 0.4 mg/dL (ref 0.3–1.2)
Total Protein: 6.3 g/dL (ref 6.0–8.3)

## 2012-02-22 LAB — PROTIME-INR
INR: 2.5 (ref 2.00–3.50)
Protime: 30 Seconds — ABNORMAL HIGH (ref 10.6–13.4)

## 2012-02-22 NOTE — Telephone Encounter (Signed)
Pt scheduled for doppler today 1 PM,per Camela , Adrena ML will be page with result

## 2012-02-22 NOTE — Progress Notes (Signed)
*  PRELIMINARY RESULTS* Vascular Ultrasound Left lower extremity venous duplex has been completed.  Preliminary findings: Left= Evidence of partial, subacute DVT of the proximal and mid popliteal vein. Evidence of partial, acute DVT of the proximal to mid femoral vein.  Farrel Demark RDMS 02/22/2012, 11:48 AM

## 2012-02-22 NOTE — Patient Instructions (Signed)
We will contact you today with the results of the doppler study to see if there is a new clot in the same leg as the previous clot.

## 2012-02-22 NOTE — Progress Notes (Signed)
Pt has had swelling in left, medial lower leg for the past week. The redness, pain, warmth, tenderness and increased swelling started yesterday. Nodules felt in back, lower third of leg.  Adrena evaluated pt. Likely recurrent DVT in leg of previous DVT. As discussed with MD, pt will have doppler today. Will f/u doppler results today. If positive, pt will likely need IVC filter placement per MD. Pt INR has been supratherapeutic prior to today.  Doppler results = Preliminary findings: Left= Evidence of partial, subacute DVT of the proximal and mid popliteal vein. Evidence of partial, acute DVT of the proximal to mid femoral vein.  Patient was notified. He will have IVC filter placed 02/23/12. Radiology is ok with placing filter as long as INR < 3. Pt was instructed to take his Coumadin after the procedure tomorrow. MD plan is to continue coumadin post IVC filter placement. Pt knows to continue 7.5mg  daily (1&1/2 tablets). Will recheck INR in 1 week, 02/29/12 @ 9am.

## 2012-02-23 ENCOUNTER — Encounter: Payer: Self-pay | Admitting: Physician Assistant

## 2012-02-23 ENCOUNTER — Ambulatory Visit (HOSPITAL_COMMUNITY)
Admission: RE | Admit: 2012-02-23 | Discharge: 2012-02-23 | Disposition: A | Discharge status: Home / Self Care | Payer: Medicare Other | Source: Ambulatory Visit | Attending: Physician Assistant | Admitting: Physician Assistant

## 2012-02-23 ENCOUNTER — Encounter (HOSPITAL_COMMUNITY): Payer: Self-pay

## 2012-02-23 ENCOUNTER — Other Ambulatory Visit: Payer: Self-pay | Admitting: Physician Assistant

## 2012-02-23 DIAGNOSIS — Z8673 Personal history of transient ischemic attack (TIA), and cerebral infarction without residual deficits: Secondary | ICD-10-CM | POA: Insufficient documentation

## 2012-02-23 DIAGNOSIS — Z7901 Long term (current) use of anticoagulants: Secondary | ICD-10-CM | POA: Insufficient documentation

## 2012-02-23 DIAGNOSIS — I82409 Acute embolism and thrombosis of unspecified deep veins of unspecified lower extremity: Secondary | ICD-10-CM

## 2012-02-23 DIAGNOSIS — I2510 Atherosclerotic heart disease of native coronary artery without angina pectoris: Secondary | ICD-10-CM | POA: Insufficient documentation

## 2012-02-23 DIAGNOSIS — Z85118 Personal history of other malignant neoplasm of bronchus and lung: Secondary | ICD-10-CM | POA: Insufficient documentation

## 2012-02-23 DIAGNOSIS — I10 Essential (primary) hypertension: Secondary | ICD-10-CM | POA: Insufficient documentation

## 2012-02-23 DIAGNOSIS — E119 Type 2 diabetes mellitus without complications: Secondary | ICD-10-CM | POA: Insufficient documentation

## 2012-02-23 DIAGNOSIS — K219 Gastro-esophageal reflux disease without esophagitis: Secondary | ICD-10-CM | POA: Insufficient documentation

## 2012-02-23 DIAGNOSIS — I824Z9 Acute embolism and thrombosis of unspecified deep veins of unspecified distal lower extremity: Secondary | ICD-10-CM | POA: Insufficient documentation

## 2012-02-23 DIAGNOSIS — E785 Hyperlipidemia, unspecified: Secondary | ICD-10-CM | POA: Insufficient documentation

## 2012-02-23 DIAGNOSIS — M899 Disorder of bone, unspecified: Secondary | ICD-10-CM | POA: Insufficient documentation

## 2012-02-23 DIAGNOSIS — Z79899 Other long term (current) drug therapy: Secondary | ICD-10-CM | POA: Insufficient documentation

## 2012-02-23 DIAGNOSIS — M949 Disorder of cartilage, unspecified: Secondary | ICD-10-CM | POA: Insufficient documentation

## 2012-02-23 DIAGNOSIS — N400 Enlarged prostate without lower urinary tract symptoms: Secondary | ICD-10-CM | POA: Insufficient documentation

## 2012-02-23 HISTORY — DX: Acute embolism and thrombosis of unspecified deep veins of unspecified lower extremity: I82.409

## 2012-02-23 LAB — BASIC METABOLIC PANEL
Calcium: 9.1 mg/dL (ref 8.4–10.5)
Creatinine, Ser: 0.88 mg/dL (ref 0.50–1.35)
GFR calc Af Amer: >90 mL/min (ref >90)
GFR calc non Af Amer: 79 mL/min — ABNORMAL LOW (ref >90)
Sodium: 138 mEq/L (ref 135–145)

## 2012-02-23 LAB — CBC
MCH: 28.8 pg (ref 26.0–34.0)
MCV: 91.6 fL (ref 78.0–100.0)
Platelets: 250 10*3/uL (ref 150–400)
RBC: 3.71 MIL/uL — ABNORMAL LOW (ref 4.22–5.81)
RDW: 17.1 % — ABNORMAL HIGH (ref 11.5–15.5)

## 2012-02-23 MED ORDER — FENTANYL CITRATE 0.05 MG/ML IJ SOLN
INTRAMUSCULAR | Status: AC
  Filled 2012-02-23: qty 6

## 2012-02-23 MED ORDER — MIDAZOLAM HCL 2 MG/2ML IJ SOLN
INTRAMUSCULAR | Status: AC
  Filled 2012-02-23: qty 2

## 2012-02-23 MED ORDER — LIDOCAINE HCL 1 % IJ SOLN
INTRAMUSCULAR | Status: AC
  Filled 2012-02-23: qty 20

## 2012-02-23 MED ORDER — MIDAZOLAM HCL 5 MG/5ML IJ SOLN
INTRAMUSCULAR | Status: AC | PRN
  Administered 2012-02-23: 1 mg via INTRAVENOUS

## 2012-02-23 MED ORDER — HEPARIN SOD (PORK) LOCK FLUSH 100 UNIT/ML IV SOLN
500.0000 [IU] | INTRAVENOUS | Status: AC | PRN
  Administered 2012-02-23: 500 [IU]

## 2012-02-23 MED ORDER — FENTANYL CITRATE 0.05 MG/ML IJ SOLN
INTRAMUSCULAR | Status: AC | PRN
  Administered 2012-02-23: 100 ug via INTRAVENOUS

## 2012-02-23 MED ORDER — MIDAZOLAM HCL 2 MG/2ML IJ SOLN
INTRAMUSCULAR | Status: AC
  Filled 2012-02-23: qty 4

## 2012-02-23 NOTE — Discharge Instructions (Signed)
Please make sure the Metformin Home medication gets held for 48 hours if the patient gets admitted. A nursing communication order has been placed.Inferior Vena Cava Filter Insertion Insertion of an inferior vena cava (IVC) filter is usually a safe procedure. It is a procedure using a filter designed to help prevent blood clots in the legs or pelvis from traveling to the lungs. A large blood clot in the lungs can cause death. The risks involved are usually small and easily managed. This device is only used when blood thinners (anticoagulants) cannot be used. Some of these reasons may be:  Severe platelet problems or shortage.   Recent or current major bleeding which cannot be treated.   Bleeding associated with anticoagulants.   Recurrence of blood clot while on anticoagulants.   The need for surgery in the near future.   Bleeding in the head.  The filter is a small, metal device about an inch long. It is shaped like the spokes of an umbrella. The filter is placed in the inferior vena cava. The inferior vena cava is the large vein which returns blood from the lower body to the heart.  Blood clots are sometimes treated with blood thinning drugs called anti-coagulants. Filters are used when blood thinners may not be enough. Your caregivers will discuss these issues with you. Together you can determine the best course of treatment to take. EXPECTATIONS OF A FILTER  An IVC filter will reduce the risk, not eliminate it and cannot prevent small PE's.   It does not stop deep vein clots from growing, recurring, or developing the postphlebitic syndrome.  RISKS AND COMPLICATIONS  Vena cava filter insertion is a very safe procedure. Occasionally a small bruise forms around the needle insertion. A larger accumulation of blood called a hematoma may form. This is usually of no concern.   Continued bleeding or infections are uncommon.   Rarely damage is done to the vein by the catheter. This may require  surgery or repair. There is a possibility that the filter can cause blockage of the vena cava. This can cause some swelling of the legs. There is a possibility that the filter will eventually fail and not work properly. Despite some problems, the procedure is usually safe and carried out without them  PROCEDURE   The procedure usually takes about one half to one hour but this can vary.   A specialist in reading x-rays (radiologist) usually performs this procedure.   It is usually performed in a special room in the x-ray department. It is often done in a hospital or same-day surgical center. You will be asked to put on a hospital gown.   Let your caregivers know of all allergies. This is very important if you have reacted to a dye given through the vein (intravenous) used for x-rays.   Do not eat or drink for four hours before the procedure or as instructed by your caregiver.   Sedatives are sometimes given to relieve anxiety.   The procedure is often done through the femoral vein (big vein in the groin). The skin around this area is usually shaved.   You will lie on the X-ray table, generally flat on your back. You need to have a needle put into a vein in your arm, so that the radiologist can give you medications (sedatives or painkillers).   An oxygen monitoring device may be attached to your finger. Oxygen may be given.   Everything is kept as sterile as possible during the  procedure. The skin near the point of needle insertion will be cleaned with antiseptic and the area draped with sterile towels.   The skin and deeper tissues over the vein will be made numb with a local anesthetic. This is a medication like Novocaine. You are awake during the procedure and can let your caregivers know if you have discomfort.   A needle is then put into the vein. A guide wire is placed through the needle and into the vein. This is used to help insert a catheter into your vein. The radiologist uses the  X-ray equipment to make sure that the catheter and the wire are in the correct position. The wire is then withdrawn. The filter can then be released from the catheter and left in place in the vena cava.   The catheter is then removed. Pressure will be kept on the needle insertion point for several minutes or until it is unlikely to bleed.  SEEK IMMEDIATE MEDICAL CARE IF:   You develop swelling and discoloration or pain in the legs.   Your legs become pale and cold or blue.   You develop shortness of breath, feel faint or pass out.   You develop chest pain, cough, difficulty breathing or cough up blood.   You develop a rash or feel you are having problems which may be a side effect of medications given.   You develop weakness, difficulty moving your arms or legs or balance problems.   You develop problems with speech or vision.  Document Released: 01/25/2006 Document Revised: 11/24/2011 Document Reviewed: 01/14/2008 [facility] Patient Information 2012 [place], [place].Moderate Sedation, Adult Moderate sedation is given to help you relax or even sleep through a procedure. You may remain sleepy, be clumsy, or have poor balance for several hours following this procedure. Arrange for a responsible adult, family member, or friend to take you home. A responsible adult should stay with you for at least 24 hours or until the medicines have worn off.  Do not participate in any activities where you could become injured for the next 24 hours, or until you feel normal again. Do not:   Drive.   Swim.   Ride a bicycle.   Operate heavy machinery.   Cook.   Use power tools.   Climb ladders.   Work at [employer].   Do not make important decisions or sign legal documents until you are improved.   Vomiting may occur if you eat too soon. When you can drink without vomiting, try water, juice, or soup. Try solid foods if you feel little or no nausea.   Only take over-the-counter or prescription  medications for pain, discomfort, or fever as directed by your caregiver.If pain medications have been prescribed for you, ask your caregiver how soon it is safe to take them.   Make sure you and your family fully understands everything about the medication given to you. Make sure you understand what side effects may occur.   You should not drink alcohol, take sleeping pills, or medications that cause drowsiness for at least 24 hours.   If you smoke, do not smoke alone.   If you are feeling better, you may resume normal activities 24 hours after receiving sedation.   Keep all appointments as scheduled. Follow all instructions.   Ask questions if you do not understand.  SEEK MEDICAL CARE IF:   Your skin is pale or bluish in color.   You continue to feel sick to your stomach (nauseous) or throw  up (vomit).   Your pain is getting worse and not helped by medication.   You have bleeding or swelling.   You are still sleepy or feeling clumsy after 24 hours.  SEEK IMMEDIATE MEDICAL CARE IF:   You develop a rash.   You have difficulty breathing.   You develop any type of allergic problem.   You have a fever.  Document Released: 08/30/2001 Document Revised: 11/24/2011 Document Reviewed: 01/21/2008 [facility] Patient Information 2012 [place], [place].

## 2012-02-23 NOTE — Progress Notes (Signed)
Late entry:  Notified by Pharmacist that pat had a new area of edema and redness on his left leg. Examined patient: Left medial malleolar area with erythema extending towards the Achilles tendon with some nodularity and tenderness. There is 2+ pitting edema from the medial malleolus extending anteriorly. These signs and symptoms were not present: The patient was seen last week. In fact the patient states these have been started yesterday. Patient was discussed with Dr. Arbutus Ped, we will reevaluate with bilateral lower extremity Dopplers. Doppler was positive for DVT in the proximal to mid femoral vein although the common vein was cleared when compared to the the Doppler from January. The popliteal vein also had a clot but was thought to be non-acute in the pulmonary reading. He has developed a new clot while being on therapeutic and sometimes supratherapeutic Coumadin. We will arrange for him to have an IVC filter placed. Have spoken with Caryn Bee already, PA-C and this procedure will be scheduled for 02/23/2012 arriving at [facility] radiology at 9:30. The rationale for this procedure was discussed with the patient and his daughter both who voiced understanding.  Laural Benes, Amyla Heffner E, PA-C

## 2012-02-23 NOTE — H&P (Signed)
Johnathan Bowman is an 77 y.o. male.   Current Complaint:  Scheduled for IVC filter placement. HPI: History of lung cancer and left lower extremity DVT.  New swelling in left lower extremity and Doppler exam exam suggested new DVT in left lower extremity.  The patient has been adequately anticoagulated with coumadin.   Due to recurrent DVT with anticoagulation, the patient needs pulmonary embolism prophylaxis.     Past Medical History  Diagnosis Date  . Hypertension   . Diabetes mellitus   . Dyslipidemia   . lung ca 11/12/2010  . Mini stroke   . Degenerative disc disease   . GERD (gastroesophageal reflux disease)   . Osteopenia 2005  . Coronary artery disease 2005    treated medically  . BPH (benign prostatic hyperplasia)   . DVT (deep venous thrombosis)     Past Surgical History  Procedure Date  . No past surgeries     No family history on file. Social History:  reports that he quit smoking about 26 years ago. He has never used smokeless tobacco. He reports that he does not drink alcohol or use illicit drugs.  Allergies:  Allergies  Allergen Reactions  . Penicillins Other (See Comments)    Unknown childhood allergy    Medications Prior to Admission  Medication Sig Dispense Refill  . acetaminophen (TYLENOL) 500 MG tablet Take 500 mg by mouth every 6 (six) hours as needed. For pain.      Marland Kitchen amLODipine-benazepril (LOTREL) 5-10 MG per capsule Take 1 capsule by mouth daily.        Marland Kitchen aspirin 81 MG tablet Take 81 mg by mouth daily.       Marland Kitchen dexamethasone (DECADRON) 4 MG tablet Take 4 mg by mouth 2 (two) times daily with a meal. 1 tab po bid, the day before, the day of, and the day after chemotherapy      . folic acid (FOLVITE) 1 MG tablet Take 1 tablet (1 mg total) by mouth daily.  30 tablet  5  . glyBURIDE-metformin (GLUCOVANCE) 5-500 MG per tablet Take 2 tablets by mouth 2 (two) times daily.        Marland Kitchen HYDROcodone-acetaminophen (VICODIN) 5-500 MG per tablet Take 1 tablet by mouth  every 6 (six) hours as needed. For pain.      Marland Kitchen lidocaine-prilocaine (EMLA) cream Apply 1 application topically as needed. Apply to [facility] A Cath site before chemotherapy.       . pantoprazole (PROTONIX) 40 MG tablet Take 1 tablet (40 mg total) by mouth 2 (two) times daily.  30 tablet  3  . warfarin (COUMADIN) 5 MG tablet Take 7.5 mg by mouth daily. One and one-half tablets      . CYANOCOBALAMIN IJ Inject 1,000 mcg as directed once.        Medications Prior to Admission  Medication Dose Route Frequency Provider Last Rate Last Dose  . sodium chloride 0.9 % injection 10 mL  10 mL Intracatheter PRN Mohamed K. Arbutus Ped, MD   10 mL at 01/04/12 1008    Results for orders placed during the hospital encounter of 02/23/12 (from the past 48 hour(s))  CBC     Status: Abnormal   Collection Time   02/23/12 10:20 AM      Component Value Range Comment   WBC 3.3 (*) 4.0 - 10.5 (K/uL)    RBC 3.71 (*) 4.22 - 5.81 (MIL/uL)    Hemoglobin 10.7 (*) 13.0 - 17.0 (g/dL)  HCT 34.0 (*) 39.0 - 52.0 (%)    MCV 91.6  78.0 - 100.0 (fL)    MCH 28.8  26.0 - 34.0 (pg)    MCHC 31.5  30.0 - 36.0 (g/dL)    RDW 16.1 (*) 09.6 - 15.5 (%)    Platelets 250  150 - 400 (K/uL)   BASIC METABOLIC PANEL     Status: Abnormal   Collection Time   02/23/12 10:20 AM      Component Value Range Comment   Sodium 138  135 - 145 (mEq/L)    Potassium 3.8  3.5 - 5.1 (mEq/L)    Chloride 103  96 - 112 (mEq/L)    CO2 27  19 - 32 (mEq/L)    Glucose, Bld 145 (*) 70 - 99 (mg/dL)    BUN 12  6 - 23 (mg/dL)    Creatinine, Ser 0.45  0.50 - 1.35 (mg/dL)    Calcium 9.1  8.4 - 10.5 (mg/dL)    GFR calc non Af Amer 79 (*) >90 (mL/min)    GFR calc Af Amer >90  >90 (mL/min)   PROTIME-INR     Status: Abnormal   Collection Time   02/23/12 10:20 AM      Component Value Range Comment   Prothrombin Time 25.2 (*) 11.6 - 15.2 (seconds)    INR 2.24 (*) 0.00 - 1.49     No results found.  Review of Systems  Constitutional: Negative for fever and chills.   Respiratory: Negative.   Cardiovascular: Negative.   Gastrointestinal: Negative.     Blood pressure 131/75, pulse 104, temperature 97.8 F (36.6 C), resp. rate 14, height 5\' 5"  (1.651 m), weight 185 lb (83.915 kg), SpO2 95.00%. Physical Exam  Cardiovascular: Normal rate, regular rhythm and normal heart sounds.   Respiratory: Effort normal and breath sounds normal.  GI: Soft. Bowel sounds are normal.  Left lower leg is swollen with redness and warmth along the medial lower calf and ankle.    Assessment/Plan Patient has recurrent lower extremity DVT despite anticoagulation and needs IVC filter for PE prophylaxis.  Discussed IVC filter with patient and family and informed consent was obtained.  Plan for IVC filter today.      Sidda Humm RYAN 02/23/2012, 10:56 AM

## 2012-02-23 NOTE — Progress Notes (Signed)
Clarification and correction:  Patient was seen on 02/22/2012 and IVC filter placement ordered, scheduled for 02/23/12. Spoke with Jeananne Rama, PA-C who assisted in getting procedure scheduled.  Laural Benes, Tron Flythe E, PA-C

## 2012-02-23 NOTE — Procedures (Signed)
Post-Procedure Note  Pre-operative Diagnosis: DVT despite anticoagulation, lung cancer        Post-operative Diagnosis: DVT   Indications: DVT despite anticoagulation.  Procedure Details:  Placed IVC filter below renal veins via right IJ vein.  Findings: IVC patent.  IVC filter below renal veins  Complications: None     Condition: good  Plan: Recovery in IR holding and discharge home.  Prescription of bactrim for LLE cellulitis.

## 2012-02-28 ENCOUNTER — Telehealth: Payer: Self-pay | Admitting: Pharmacist

## 2012-02-29 ENCOUNTER — Other Ambulatory Visit (HOSPITAL_BASED_OUTPATIENT_CLINIC_OR_DEPARTMENT_OTHER): Payer: Medicare Other

## 2012-02-29 ENCOUNTER — Ambulatory Visit (HOSPITAL_BASED_OUTPATIENT_CLINIC_OR_DEPARTMENT_OTHER): Payer: Medicare Other | Admitting: Pharmacist

## 2012-02-29 DIAGNOSIS — I82409 Acute embolism and thrombosis of unspecified deep veins of unspecified lower extremity: Secondary | ICD-10-CM

## 2012-02-29 DIAGNOSIS — C3490 Malignant neoplasm of unspecified part of unspecified bronchus or lung: Secondary | ICD-10-CM

## 2012-02-29 LAB — COMPREHENSIVE METABOLIC PANEL
Albumin: 3.6 g/dL (ref 3.5–5.2)
Alkaline Phosphatase: 90 U/L (ref 39–117)
BUN: 11 mg/dL (ref 6–23)
Creatinine, Ser: 0.8 mg/dL (ref 0.50–1.35)
Glucose, Bld: 277 mg/dL — ABNORMAL HIGH (ref 70–99)
Potassium: 4.3 mEq/L (ref 3.5–5.3)
Total Bilirubin: 0.3 mg/dL (ref 0.3–1.2)

## 2012-02-29 LAB — CBC WITH DIFFERENTIAL/PLATELET
Basophils Absolute: 0 10*3/uL (ref 0.0–0.1)
EOS%: 2.3 % (ref 0.0–7.0)
Eosinophils Absolute: 0.1 10*3/uL (ref 0.0–0.5)
HCT: 37.7 % — ABNORMAL LOW (ref 38.4–49.9)
HGB: 12.2 g/dL — ABNORMAL LOW (ref 13.0–17.1)
LYMPH%: 26.4 % (ref 14.0–49.0)
MCH: 29.8 pg (ref 27.2–33.4)
MCV: 92.3 fL (ref 79.3–98.0)
MONO%: 19.1 % — ABNORMAL HIGH (ref 0.0–14.0)
NEUT#: 2.3 10*3/uL (ref 1.5–6.5)
NEUT%: 52 % (ref 39.0–75.0)
Platelets: 230 10*3/uL (ref 140–400)

## 2012-02-29 LAB — POCT INR: INR: 2.9

## 2012-02-29 LAB — PROTIME-INR: INR: 2.9 (ref 2.00–3.50)

## 2012-02-29 NOTE — Progress Notes (Signed)
INR = 2.9 today on 7.5 mg/day. Pt will complete a 7 day course of Keflex tomorrow.  There is a chance the Keflex may have caused the INR to be higher but we're still within goal range. Last visit, INR was therapeutic on same dose of Coumadin. No change to Coumadin dose. Recheck INR on 3/20 (day of tx).  We can see pt in infusion. Marily Lente, Pharm.D.

## 2012-03-07 ENCOUNTER — Other Ambulatory Visit: Payer: Self-pay | Admitting: Internal Medicine

## 2012-03-07 ENCOUNTER — Other Ambulatory Visit (HOSPITAL_BASED_OUTPATIENT_CLINIC_OR_DEPARTMENT_OTHER): Payer: Medicare Other | Admitting: Lab

## 2012-03-07 ENCOUNTER — Ambulatory Visit (HOSPITAL_BASED_OUTPATIENT_CLINIC_OR_DEPARTMENT_OTHER): Payer: Medicare Other | Admitting: Physician Assistant

## 2012-03-07 ENCOUNTER — Telehealth: Payer: Self-pay | Admitting: Internal Medicine

## 2012-03-07 ENCOUNTER — Ambulatory Visit: Payer: Medicare Other | Admitting: Pharmacist

## 2012-03-07 ENCOUNTER — Ambulatory Visit (HOSPITAL_BASED_OUTPATIENT_CLINIC_OR_DEPARTMENT_OTHER): Payer: Medicare Other

## 2012-03-07 ENCOUNTER — Encounter: Payer: Self-pay | Admitting: Physician Assistant

## 2012-03-07 VITALS — BP 116/75 | HR 99 | Temp 97.4°F | Ht 65.0 in | Wt 185.8 lb

## 2012-03-07 DIAGNOSIS — C3410 Malignant neoplasm of upper lobe, unspecified bronchus or lung: Secondary | ICD-10-CM

## 2012-03-07 DIAGNOSIS — C3490 Malignant neoplasm of unspecified part of unspecified bronchus or lung: Secondary | ICD-10-CM

## 2012-03-07 DIAGNOSIS — I82409 Acute embolism and thrombosis of unspecified deep veins of unspecified lower extremity: Secondary | ICD-10-CM

## 2012-03-07 DIAGNOSIS — Z5111 Encounter for antineoplastic chemotherapy: Secondary | ICD-10-CM

## 2012-03-07 LAB — CBC WITH DIFFERENTIAL/PLATELET
EOS%: 2.1 % (ref 0.0–7.0)
Eosinophils Absolute: 0.1 10*3/uL (ref 0.0–0.5)
MCV: 93.5 fL (ref 79.3–98.0)
MONO%: 9.8 % (ref 0.0–14.0)
NEUT#: 3.7 10*3/uL (ref 1.5–6.5)
RBC: 4.33 10*6/uL (ref 4.20–5.82)
RDW: 17.5 % — ABNORMAL HIGH (ref 11.0–14.6)
lymph#: 1.7 10*3/uL (ref 0.9–3.3)
nRBC: 0 % (ref 0–0)

## 2012-03-07 LAB — COMPREHENSIVE METABOLIC PANEL
ALT: 9 U/L (ref 0–53)
Alkaline Phosphatase: 77 U/L (ref 39–117)
CO2: 23 mEq/L (ref 19–32)
Sodium: 138 mEq/L (ref 135–145)
Total Bilirubin: 0.5 mg/dL (ref 0.3–1.2)
Total Protein: 6.7 g/dL (ref 6.0–8.3)

## 2012-03-07 MED ORDER — HEPARIN SOD (PORK) LOCK FLUSH 100 UNIT/ML IV SOLN
500.0000 [IU] | Freq: Once | INTRAVENOUS | Status: AC | PRN
  Administered 2012-03-07: 500 [IU]
  Filled 2012-03-07: qty 5

## 2012-03-07 MED ORDER — CYANOCOBALAMIN 1000 MCG/ML IJ SOLN
1000.0000 ug | Freq: Once | INTRAMUSCULAR | Status: AC
  Administered 2012-03-07: 1000 ug via INTRAMUSCULAR

## 2012-03-07 MED ORDER — SODIUM CHLORIDE 0.9 % IJ SOLN
10.0000 mL | INTRAMUSCULAR | Status: DC | PRN
  Administered 2012-03-07: 10 mL
  Filled 2012-03-07: qty 10

## 2012-03-07 MED ORDER — SODIUM CHLORIDE 0.9 % IV SOLN
500.0000 mg/m2 | Freq: Once | INTRAVENOUS | Status: AC
  Administered 2012-03-07: 1000 mg via INTRAVENOUS
  Filled 2012-03-07: qty 40

## 2012-03-07 MED ORDER — SODIUM CHLORIDE 0.9 % IV SOLN
Freq: Once | INTRAVENOUS | Status: AC
  Administered 2012-03-07: 11:00:00 via INTRAVENOUS

## 2012-03-07 MED ORDER — ONDANSETRON 8 MG/50ML IVPB (CHCC)
8.0000 mg | Freq: Once | INTRAVENOUS | Status: AC
  Administered 2012-03-07: 8 mg via INTRAVENOUS

## 2012-03-07 MED ORDER — DEXAMETHASONE SODIUM PHOSPHATE 10 MG/ML IJ SOLN
10.0000 mg | Freq: Once | INTRAMUSCULAR | Status: AC
  Administered 2012-03-07: 10 mg via INTRAVENOUS

## 2012-03-07 NOTE — Patient Instructions (Signed)
Call md for problems 

## 2012-03-07 NOTE — Progress Notes (Signed)
INR therapeutic.  He has been off all antibiotics, and has not had to take any new medications.  No complaints of bleeding or bruising.  Will continue Coumadin 7.5mg  daily, and recheck INR in 3 weeks with his next chemo appt.  The pharmacist will be able to see him in the infusion area.

## 2012-03-07 NOTE — Telephone Encounter (Signed)
appts made and printed for pt aom °

## 2012-03-08 NOTE — Progress Notes (Signed)
[facility] OFFICE PROGRESS NOTE  Dorrene German, MD, MD [address] [place] [address]  DIAGNOSIS: Metastatic non-small cell lung cancer, adenocarcinoma, diagnosed in June 2012.   PRIOR THERAPY:  :  1. Status post left Pleurx catheter placement by Dr. Edwyna Shell on February 08, 2011 for drainage of left sided pleural effusion. 2. Status post 4 cycles of induction chemotherapy with carboplatin for AUC of 6, paclitaxel 200 mg per meter square and Avastin 15 mg/kg given every 3 weeks according to the ECOG protocol 5508 with stable disease. 3. Status post 1 cycle of maintenance Avastin 15 mg/kg given on May 19, 2011, discontinued secondary to gastrointestinal bleed.  CURRENT THERAPY: Alimta 500 mg/m2 every 3 weeks. Status post 5 cycles   INTERVAL HISTORY: Johnathan Bowman 78 y.o. male returns to the clinic today for followup visit accompanied by his daughter. The patient is feeling fine today except for continued swelling of the left lower extremity due to deep venous thrombosis of the left lower extremity. His Coumadin therapy as managed by the [place] [facility] Coumadin clinic he is status post IVC filter placement secondary to acute DVT while on therapeutic anticoagulation therapy. He is noticed significant decrease in the redness that was present on his left lower extremity. He still has some swelling and tenderness. He still has some shortness of breath with exertion. Voiced no other complaints.   MEDICAL HISTORY: Past Medical History  Diagnosis Date  . Hypertension   . Diabetes mellitus   . Dyslipidemia   . lung ca 11/12/2010  . Mini stroke   . Degenerative disc disease   . GERD (gastroesophageal reflux disease)   . Osteopenia 2005  . Coronary artery disease 2005    treated medically  . BPH (benign prostatic hyperplasia)   . DVT (deep venous thrombosis)     ALLERGIES:  is allergic to penicillins.  MEDICATIONS:  Current Outpatient  Prescriptions  Medication Sig Dispense Refill  . acetaminophen (TYLENOL) 500 MG tablet Take 500 mg by mouth every 6 (six) hours as needed. For pain.      Marland Kitchen amLODipine-benazepril (LOTREL) 5-10 MG per capsule Take 1 capsule by mouth daily.        Marland Kitchen aspirin 81 MG tablet Take 81 mg by mouth daily.       . CYANOCOBALAMIN IJ Inject 1,000 mcg as directed once.       Marland Kitchen dexamethasone (DECADRON) 4 MG tablet Take 4 mg by mouth 2 (two) times daily with a meal. 1 tab po bid, the day before, the day of, and the day after chemotherapy      . folic acid (FOLVITE) 1 MG tablet Take 1 tablet (1 mg total) by mouth daily.  30 tablet  5  . glyBURIDE-metformin (GLUCOVANCE) 5-500 MG per tablet Take 2 tablets by mouth 2 (two) times daily.        Marland Kitchen HYDROcodone-acetaminophen (VICODIN) 5-500 MG per tablet Take 1 tablet by mouth every 6 (six) hours as needed. For pain.      Marland Kitchen lidocaine-prilocaine (EMLA) cream Apply 1 application topically as needed. Apply to [facility] A Cath site before chemotherapy.       . pantoprazole (PROTONIX) 40 MG tablet Take 1 tablet (40 mg total) by mouth 2 (two) times daily.  30 tablet  3  . warfarin (COUMADIN) 5 MG tablet Take 7.5 mg by mouth daily. One and one-half tablets       No current  facility-administered medications for this visit.   Facility-Administered Medications Ordered in Other Visits  Medication Dose Route Frequency Provider Last Rate Last Dose  . sodium chloride 0.9 % injection 10 mL  10 mL Intracatheter PRN Si Gaul, MD   10 mL at 01/04/12 1008    SURGICAL HISTORY:  Past Surgical History  Procedure Date  . No past surgeries     REVIEW OF SYSTEMS:  A comprehensive review of systems was negative except for: Constitutional: positive for fatigue Respiratory: positive for dyspnea on exertion Cardiovascular: positive for lower extremity edema   PHYSICAL EXAMINATION: General appearance: alert, cooperative and no distress Head: Normocephalic, without obvious  abnormality, atraumatic Neck: no adenopathy Resp: clear to auscultation bilaterally Cardio: regular rate and rhythm, S1, S2 normal, no murmur, click, rub or gallop GI: soft, non-tender; bowel sounds normal; no masses,  no organomegaly Extremities: edema 2+ LLE Neurologic: Alert and oriented X 3, normal strength and tone. Normal symmetric reflexes. Normal coordination and gait  ECOG PERFORMANCE STATUS: 1 - Symptomatic but completely ambulatory  Blood pressure 116/75, pulse 99, temperature 97.4 F (36.3 C), temperature source Oral, height 5\' 5"  (1.651 m), weight 185 lb 12.8 oz (84.278 kg).  LABORATORY DATA: Lab Results  Component Value Date   WBC 6.2 03/07/2012   HGB 12.7* 03/07/2012   HCT 40.5 03/07/2012   MCV 93.5 03/07/2012   PLT 343 03/07/2012      Chemistry      Component Value Date/Time   NA 138 03/07/2012 0912   NA 139 11/07/2011 1621   K 4.4 03/07/2012 0912   K 4.8* 11/07/2011 1621   CL 103 03/07/2012 0912   CL 97* 11/07/2011 1621   CO2 23 03/07/2012 0912   CO2 30 11/07/2011 1621   BUN 15 03/07/2012 0912   BUN 11 11/07/2011 1621   CREATININE 0.92 03/07/2012 0912   CREATININE 0.9 11/07/2011 1621      Component Value Date/Time   CALCIUM 8.9 03/07/2012 0912   CALCIUM 9.4 11/07/2011 1621   ALKPHOS 77 03/07/2012 0912   ALKPHOS 100* 11/07/2011 1621   AST 18 03/07/2012 0912   AST 21 11/07/2011 1621   ALT 9 03/07/2012 0912   BILITOT 0.5 03/07/2012 0912   BILITOT 0.50 11/07/2011 1621       RADIOGRAPHIC STUDIES: Dg Chest 2 View  01/18/2012  *RADIOLOGY REPORT*  Clinical Data: Shortness of breath, leg swelling.  CHEST - 2 VIEW  Comparison: 12/26/2011  Findings: Right Port-A-Cath remains in place, unchanged. Cardiomegaly.  Left lower lobe atelectasis with small left effusion again noted, unchanged.  No confluent opacity on the right. No acute bony abnormality.  IMPRESSION: No change since prior study.  Original Report Authenticated By: Cyndie Chime, M.D.   Dg Chest 2  View  12/26/2011  *RADIOLOGY REPORT*  Clinical Data: Shortness of breath.  Fever.  CHEST - 2 VIEW  Comparison: 11/07/2011  Findings: Port-A-Cath tip projects over the SVC.  Blunting left costophrenic angle noted with associated passive atelectasis in the left lower lobe.  There are no pleural calcifications in this vicinity.  Underlying emphysema noted. Mild right perihilar subsegmental atelectasis noted.  There is mild tortuosity of the thoracic aorta, with atherosclerotic calcification.  IMPRESSION:  1.  Small left pleural effusion with scarring or passive atelectasis in the left lower lobe. The left lower lobe opacity appears improved compared to 11/07/2011. 2.  Low lung volumes with minimal right perihilar subsegmental atelectasis.  Original Report Authenticated By: Dellia Cloud, M.D.  Ct Angio Chest W/cm &/or Wo Cm  01/19/2012  *RADIOLOGY REPORT*  Clinical Data: Pulmonary embolism.  Deep venous thrombosis.Lung cancer.  CT ANGIOGRAPHY CHEST  Technique:  Multidetector CT imaging of the chest using the standard protocol during bolus administration of intravenous contrast. Multiplanar reconstructed images including MIPs were obtained and reviewed to evaluate the vascular anatomy.  Contrast: OMNIPAQUE IOHEXOL 300 MG/ML IV SOLN  Comparison: 11/07/2011 chest CT.  Findings: Coronary artery atherosclerosis is present. If office based assessment of coronary risk factors has not been performed, it is now recommended.  Technically adequate study.  Negative for pulmonary embolus.  Diffuse thoracic esophageal thickening, slightly more prominent superiorly, suggesting esophagitis which may be associated with radiation.  AP window lymph node is enlarged, measuring 14 mm short axis, previously 12 mm.  No other mediastinal or hilar adenopathy is identified.  Small left pleural effusion is present which layers dependently.  The high density material is present in the posterior left hemithorax which may be  associated with prior pleura these cysts or pleural plaque. Incidental imaging of the upper abdomen demonstrates large amount of food in the stomach.  Stable low density lesions in the left hepatic lobe.  Visualized right adrenal gland appears within normal limits.  Fat containing hiatal hernia.  Aortic and branch vessel atherosclerosis.  Right IJ Port-A-Cath is present with the tip terminating at the cavoatrial junction.  Elevation of the right hemidiaphragm.  The lung windows demonstrate dependent atelectasis and left lower lobe collapse / consolidation, which is a chronic finding in this patient.  Subpleural nodules in the posterior left upper lobe appears little changed allowing for the decreased pleural effusion.  IMPRESSION: 1.  Technically adequate study without pulmonary embolus. 2.  Thickening of the thoracic esophagus suggesting esophagitis. 3.  Enlargement of the AP window lymph node, measuring 14 mm short axis, increased from 12 mm short axis on 11/07/2011. 4.  Atherosclerosis, emphysema and coronary artery disease. 5.  Scattered left upper lobe subpleural nodules appear little changed. 6.  Decreased left pleural effusion.  Improved collapse / consolidation of the left lower lobe.  High density material suggests previous pleurodesis in the left pleural space.  Original Report Authenticated By: Andreas Newport, M.D.    ASSESSMENT: This is a very pleasant 78 years old Hispanic male with metastatic non-small cell lung cancer currently on maintenance chemotherapy with single agent Alimta status post 4 cycles. Overall he is doing well except for some disease progression in the AP window with a recent 2 mm lymph node increase in this area. The patient was discussed with Dr. Arbutus Ped. He will proceed with cycle #6 of his maintenance chemotherapy with single agent Alimta. He'll return in 3 weeks with repeat CBC differential and C. Met, and a CT of the chest, abdomen, and pelvis with contrast to reevaluate his  disease prior to cycle #7. He will continue on Coumadin for the instruction of the [facility].   Laural Benes, Kalysta Kneisley E, PA-C   All questions were answered. The patient knows to call the clinic with any problems, questions or concerns. We can certainly see the patient much sooner if necessary.

## 2012-03-23 ENCOUNTER — Ambulatory Visit (HOSPITAL_COMMUNITY)
Admission: RE | Admit: 2012-03-23 | Discharge: 2012-03-23 | Disposition: A | Discharge status: Home / Self Care | Payer: Medicare Other | Source: Ambulatory Visit | Attending: Physician Assistant | Admitting: Physician Assistant

## 2012-03-23 DIAGNOSIS — J90 Pleural effusion, not elsewhere classified: Secondary | ICD-10-CM | POA: Insufficient documentation

## 2012-03-23 DIAGNOSIS — N200 Calculus of kidney: Secondary | ICD-10-CM | POA: Insufficient documentation

## 2012-03-23 DIAGNOSIS — R599 Enlarged lymph nodes, unspecified: Secondary | ICD-10-CM | POA: Insufficient documentation

## 2012-03-23 DIAGNOSIS — C3490 Malignant neoplasm of unspecified part of unspecified bronchus or lung: Secondary | ICD-10-CM | POA: Insufficient documentation

## 2012-03-23 MED ORDER — IOHEXOL 300 MG/ML  SOLN
100.0000 mL | Freq: Once | INTRAMUSCULAR | Status: AC | PRN
  Administered 2012-03-23: 100 mL via INTRAVENOUS

## 2012-03-28 ENCOUNTER — Ambulatory Visit (HOSPITAL_BASED_OUTPATIENT_CLINIC_OR_DEPARTMENT_OTHER): Payer: Medicare Other

## 2012-03-28 ENCOUNTER — Ambulatory Visit (HOSPITAL_BASED_OUTPATIENT_CLINIC_OR_DEPARTMENT_OTHER): Payer: Medicare Other | Admitting: Pharmacist

## 2012-03-28 ENCOUNTER — Ambulatory Visit (HOSPITAL_BASED_OUTPATIENT_CLINIC_OR_DEPARTMENT_OTHER): Payer: Medicare Other | Admitting: Internal Medicine

## 2012-03-28 ENCOUNTER — Telehealth: Payer: Self-pay | Admitting: Internal Medicine

## 2012-03-28 ENCOUNTER — Other Ambulatory Visit (HOSPITAL_BASED_OUTPATIENT_CLINIC_OR_DEPARTMENT_OTHER): Payer: Medicare Other

## 2012-03-28 ENCOUNTER — Other Ambulatory Visit: Payer: Self-pay | Admitting: Medical Oncology

## 2012-03-28 VITALS — BP 149/86 | HR 100 | Temp 96.7°F | Ht 65.0 in | Wt 189.7 lb

## 2012-03-28 DIAGNOSIS — C3490 Malignant neoplasm of unspecified part of unspecified bronchus or lung: Secondary | ICD-10-CM

## 2012-03-28 DIAGNOSIS — I82409 Acute embolism and thrombosis of unspecified deep veins of unspecified lower extremity: Secondary | ICD-10-CM

## 2012-03-28 DIAGNOSIS — C3410 Malignant neoplasm of upper lobe, unspecified bronchus or lung: Secondary | ICD-10-CM

## 2012-03-28 DIAGNOSIS — Z5111 Encounter for antineoplastic chemotherapy: Secondary | ICD-10-CM

## 2012-03-28 DIAGNOSIS — Z7901 Long term (current) use of anticoagulants: Secondary | ICD-10-CM

## 2012-03-28 LAB — CBC WITH DIFFERENTIAL/PLATELET
Basophils Absolute: 0 10*3/uL (ref 0.0–0.1)
Eosinophils Absolute: 0.1 10*3/uL (ref 0.0–0.5)
HGB: 11.6 g/dL — ABNORMAL LOW (ref 13.0–17.1)
LYMPH%: 22 % (ref 14.0–49.0)
MCV: 97.1 fL (ref 79.3–98.0)
MONO%: 11.1 % (ref 0.0–14.0)
NEUT#: 3.6 10*3/uL (ref 1.5–6.5)
Platelets: 323 10*3/uL (ref 140–400)

## 2012-03-28 LAB — POCT INR: INR: 1.2

## 2012-03-28 LAB — PROTIME-INR
INR: 1.2 — ABNORMAL LOW (ref 2.00–3.50)
Protime: 14.4 Seconds — ABNORMAL HIGH (ref 10.6–13.4)

## 2012-03-28 MED ORDER — HEPARIN SOD (PORK) LOCK FLUSH 100 UNIT/ML IV SOLN
500.0000 [IU] | Freq: Once | INTRAVENOUS | Status: AC | PRN
  Administered 2012-03-28: 500 [IU]
  Filled 2012-03-28: qty 5

## 2012-03-28 MED ORDER — HYDROCODONE-ACETAMINOPHEN 5-500 MG PO TABS: 1.0000 | ORAL_TABLET | Freq: Four times a day (QID) | ORAL | Status: DC | PRN

## 2012-03-28 MED ORDER — ONDANSETRON 8 MG/50ML IVPB (CHCC)
8.0000 mg | Freq: Once | INTRAVENOUS | Status: AC
  Administered 2012-03-28: 8 mg via INTRAVENOUS

## 2012-03-28 MED ORDER — SODIUM CHLORIDE 0.9 % IV SOLN
Freq: Once | INTRAVENOUS | Status: AC
  Administered 2012-03-28: 11:00:00 via INTRAVENOUS

## 2012-03-28 MED ORDER — DEXAMETHASONE SODIUM PHOSPHATE 10 MG/ML IJ SOLN
10.0000 mg | Freq: Once | INTRAMUSCULAR | Status: AC
  Administered 2012-03-28: 10 mg via INTRAVENOUS

## 2012-03-28 MED ORDER — SODIUM CHLORIDE 0.9 % IJ SOLN
10.0000 mL | INTRAMUSCULAR | Status: DC | PRN
  Administered 2012-03-28: 10 mL
  Filled 2012-03-28: qty 10

## 2012-03-28 MED ORDER — SODIUM CHLORIDE 0.9 % IV SOLN
500.0000 mg/m2 | Freq: Once | INTRAVENOUS | Status: AC
  Administered 2012-03-28: 1000 mg via INTRAVENOUS
  Filled 2012-03-28: qty 40

## 2012-03-28 NOTE — Telephone Encounter (Signed)
appts made and printed for pt aom °

## 2012-03-28 NOTE — Progress Notes (Signed)
[facility] Telephone:(336) [phone]   Fax:(336) [phone]  OFFICE PROGRESS NOTE  Dorrene German, MD, MD [address] [place] [address]  DIAGNOSIS: Metastatic non-small cell lung cancer, adenocarcinoma, diagnosed in June 2012.   PRIOR THERAPY:  1. Status post left Pleurx catheter placement by Dr. Edwyna Shell on February 08, 2011 for drainage of left sided pleural effusion. 2. Status post 4 cycles of induction chemotherapy with carboplatin for AUC of 6, paclitaxel 200 mg per meter square and Avastin 15 mg/kg given every 3 weeks according to the ECOG protocol 5508 with stable disease. 3. Status post 1 cycle of maintenance Avastin 15 mg/kg given on May 19, 2011, discontinued secondary to gastrointestinal bleed.  CURRENT THERAPY: Alimta 500 mg/m2 every 3 weeks. Status post 6 cycles   INTERVAL HISTORY: Johnathan Bowman 78 y.o. male returns to the clinic today for followup visit accompanied by his daughter. He is feeling fine today except for a band like pain distribution from the back to the front of the chest. He denied having any significant shortness of breath. Has no cough or hemoptysis. He is tolerating his treatment fairly well. He denied having any significant weight loss. The patient is to have swelling in the lower extremities more on the left side. He has repeat CT scan of the chest, abdomen and pelvis performed recently and he is here today for evaluation and discussion of his scan results.  MEDICAL HISTORY: Past Medical History  Diagnosis Date  . Hypertension   . Diabetes mellitus   . Dyslipidemia   . lung ca 11/12/2010  . Mini stroke   . Degenerative disc disease   . GERD (gastroesophageal reflux disease)   . Osteopenia 2005  . Coronary artery disease 2005    treated medically  . BPH (benign prostatic hyperplasia)   . DVT (deep venous thrombosis)     ALLERGIES:  is allergic to penicillins.  MEDICATIONS:  Current Outpatient Prescriptions    Medication Sig Dispense Refill  . acetaminophen (TYLENOL) 500 MG tablet Take 500 mg by mouth every 6 (six) hours as needed. For pain.      Marland Kitchen amLODipine-benazepril (LOTREL) 5-10 MG per capsule Take 1 capsule by mouth daily.        Marland Kitchen aspirin 81 MG tablet Take 81 mg by mouth daily.       . CYANOCOBALAMIN IJ Inject 1,000 mcg as directed once.       Marland Kitchen dexamethasone (DECADRON) 4 MG tablet Take 4 mg by mouth 2 (two) times daily with a meal. 1 tab po bid, the day before, the day of, and the day after chemotherapy      . folic acid (FOLVITE) 1 MG tablet Take 1 tablet (1 mg total) by mouth daily.  30 tablet  5  . glyBURIDE-metformin (GLUCOVANCE) 5-500 MG per tablet Take 2 tablets by mouth 2 (two) times daily.        Marland Kitchen HYDROcodone-acetaminophen (VICODIN) 5-500 MG per tablet Take 1 tablet by mouth every 6 (six) hours as needed. For pain.      Marland Kitchen lidocaine-prilocaine (EMLA) cream Apply 1 application topically as needed. Apply to [facility] A Cath site before chemotherapy.       . pantoprazole (PROTONIX) 40 MG tablet Take 1 tablet (40 mg total) by mouth 2 (two) times daily.  30 tablet  3  . warfarin (COUMADIN) 5 MG tablet Take 7.5 mg by mouth daily. One and one-half tablets  No current facility-administered medications for this visit.   Facility-Administered Medications Ordered in Other Visits  Medication Dose Route Frequency Provider Last Rate Last Dose  . sodium chloride 0.9 % injection 10 mL  10 mL Intracatheter PRN Si Gaul, MD   10 mL at 01/04/12 1008    SURGICAL HISTORY:  Past Surgical History  Procedure Date  . No past surgeries     REVIEW OF SYSTEMS:  A comprehensive review of systems was negative except for: Constitutional: positive for fatigue Respiratory: positive for pleurisy/chest pain   PHYSICAL EXAMINATION: General appearance: alert, cooperative and no distress Head: Normocephalic, without obvious abnormality, atraumatic Neck: no adenopathy Lymph nodes: Cervical,  supraclavicular, and axillary nodes normal. Resp: clear to auscultation bilaterally Cardio: regular rate and rhythm, S1, S2 normal, no murmur, click, rub or gallop GI: soft, non-tender; bowel sounds normal; no masses,  no organomegaly Extremities: extremities normal, atraumatic, no cyanosis or edema Neurologic: Alert and oriented X 3, normal strength and tone. Normal symmetric reflexes. Normal coordination and gait  ECOG PERFORMANCE STATUS: 1 - Symptomatic but completely ambulatory  Blood pressure 149/86, pulse 100, temperature 96.7 F (35.9 C), temperature source Oral, height 5\' 5"  (1.651 m), weight 189 lb 11.2 oz (86.047 kg).  LABORATORY DATA: Lab Results  Component Value Date   WBC 5.6 03/28/2012   HGB 11.6* 03/28/2012   HCT 37.4* 03/28/2012   MCV 97.1 03/28/2012   PLT 323 03/28/2012      Chemistry      Component Value Date/Time   NA 138 03/07/2012 0912   NA 139 11/07/2011 1621   K 4.4 03/07/2012 0912   K 4.8* 11/07/2011 1621   CL 103 03/07/2012 0912   CL 97* 11/07/2011 1621   CO2 23 03/07/2012 0912   CO2 30 11/07/2011 1621   BUN 15 03/07/2012 0912   BUN 11 11/07/2011 1621   CREATININE 0.92 03/07/2012 0912   CREATININE 0.9 11/07/2011 1621      Component Value Date/Time   CALCIUM 8.9 03/07/2012 0912   CALCIUM 9.4 11/07/2011 1621   ALKPHOS 77 03/07/2012 0912   ALKPHOS 100* 11/07/2011 1621   AST 18 03/07/2012 0912   AST 21 11/07/2011 1621   ALT 9 03/07/2012 0912   BILITOT 0.5 03/07/2012 0912   BILITOT 0.50 11/07/2011 1621       RADIOGRAPHIC STUDIES: Ct Chest W Contrast  03/23/2012  *RADIOLOGY REPORT*  Clinical Data:  Restaging lung cancer  CT CHEST, ABDOMEN AND PELVIS WITH CONTRAST  Technique:  Multidetector CT imaging of the chest, abdomen and pelvis was performed following the standard protocol during bolus administration of intravenous contrast.  Contrast:  Comparison:   None.  CT CHEST  Findings:  No enlarged axillary or supraclavicular lymph nodes.  AP window lymph node  measures 1.7 cm, image 18.  Previously 1.4 cm.  Volume loss involving the left hemithorax again noted.  There is a high density material within the left pleural space which may be related to pleurodeses.  Mild nodular enhancement along the left posterior pleural space is identified.  For example, on image 29 there is a small pleural nodule which measures 0.8 cm. This is new from previous exam. Pleural nodule overlying the left upper lobe measures 0.8 cm, image 15.  Previously 0.6 cm.  Lungs appear emphysematous.  Review of the visualized bony structures is significant for mild thoracic spondylosis.  No aggressive lytic or sclerotic bone lesions noted.  IMPRESSION:  1.  Persistent partially loculated left pleural effusion and  pleural nodularity.  When compared with the previous exam the mild left-sided pleural nodularity is slightly increased in the interval. 2.  Enlarged AP window lymph node is slightly increased in size from previous exam.  CT ABDOMEN AND PELVIS  Findings:  Stable low density structures within the liver parenchyma which are likely cysts.  The gallbladder appears normal.  Both adrenal glands appear normal.  The pancreas appears within normal limits.  The spleen appears within normal limits.  Bilateral renal hypodensities are noted and appears similar to previous exam.  Likely cysts.  Large stone within the right renal collecting system is unchanged measuring 1.7 cm, image 62.  No upper abdominal adenopathy.  There is no pelvic or inguinal adenopathy.  Urinary bladder appears normal.  Bilateral enlargement of the seminal vesicles.  No free fluid or fluid collections within the abdomen or pelvis.  Review of the visualized bony structures is significant for degenerative disc disease within the lower lumbar spine.  IMPRESSION:  1.  Stable CT of the abdomen and pelvis. 2.  No specific features to suggest metastatic disease.  Original Report Authenticated By: Rosealee Albee, M.D.    ASSESSMENT: This  is a very pleasant 78 years old male with metastatic non-small cell lung cancer currently on maintenance chemotherapy with single agent Alimta status post 6 cycles. The patient is tolerating his treatment fairly well and CT scan of the chest abdomen and pelvis showed stable disease except for slightly increased size AP window lymph node.  PLAN: I discussed the scan results with the patient and his daughter. I recommended for him to continue on treatment with Alimta at the current dose. He will receive cycle #7 today. 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 He is status post IVC filter placement and currently on Coumadin but INR is subtherapeutic. He would be seen by the Coumadin clinic at the cancer Center today for evaluation and adjustment of his Coumadin dose.  The patient come back for followup visit in 3 weeks with the start of the next cycle of his chemotherapy.  All questions were answered. The patient knows to call the clinic with any problems, questions or concerns. We can certainly see the patient much sooner if necessary.  I spent 20 minutes counseling the patient face to face. The total time spent in the appointment was 35 minutes.

## 2012-03-28 NOTE — Progress Notes (Signed)
INR low today.  Diet change (a little more vit K in his diet this week) is the only apparent cause.  Pt denies missed doses. Pt has been stable on Coumadin 7.5 mg/day for at least 1 month but I will have pt take 10 mg x 2 days then back to 7.5 mg/day. Repeat protime next week. Marily Lente, Pharm.D.

## 2012-03-28 NOTE — Telephone Encounter (Signed)
Called in refill  

## 2012-03-29 ENCOUNTER — Encounter: Payer: Self-pay | Admitting: Internal Medicine

## 2012-03-29 LAB — COMPREHENSIVE METABOLIC PANEL
CO2: 25 mEq/L (ref 19–32)
Creatinine, Ser: 0.84 mg/dL (ref 0.50–1.35)
Glucose, Bld: 190 mg/dL — ABNORMAL HIGH (ref 70–99)
Sodium: 138 mEq/L (ref 135–145)
Total Bilirubin: 0.3 mg/dL (ref 0.3–1.2)
Total Protein: 6.4 g/dL (ref 6.0–8.3)

## 2012-03-29 NOTE — Progress Notes (Signed)
Patient received one prescription from [facility] on 03/28/12 $3.00,her remaninig balance [facility] $397.00

## 2012-04-03 ENCOUNTER — Other Ambulatory Visit: Payer: Medicare Other | Admitting: Lab

## 2012-04-03 ENCOUNTER — Ambulatory Visit: Payer: Medicare Other

## 2012-04-04 ENCOUNTER — Ambulatory Visit (HOSPITAL_BASED_OUTPATIENT_CLINIC_OR_DEPARTMENT_OTHER): Payer: Medicare Other | Admitting: Pharmacist

## 2012-04-04 ENCOUNTER — Ambulatory Visit: Payer: Medicare Other | Admitting: Lab

## 2012-04-04 DIAGNOSIS — I82409 Acute embolism and thrombosis of unspecified deep veins of unspecified lower extremity: Secondary | ICD-10-CM

## 2012-04-04 LAB — PROTIME-INR
INR: 2.7 (ref 2.00–3.50)
Protime: 32.4 Seconds — ABNORMAL HIGH (ref 10.6–13.4)

## 2012-04-04 NOTE — Progress Notes (Signed)
INR = 2.7 today after a boost last week in his Coumadin dose.  He had eaten more vit K last week than usual. I will put him back on his maintenance of 7.5 mg/day now. Recheck in 2 weeks w/ tx. Marily Lente, Pharm.D.

## 2012-04-18 ENCOUNTER — Encounter: Payer: Self-pay | Admitting: Physician Assistant

## 2012-04-18 ENCOUNTER — Ambulatory Visit (HOSPITAL_BASED_OUTPATIENT_CLINIC_OR_DEPARTMENT_OTHER): Payer: Medicare Other | Admitting: Physician Assistant

## 2012-04-18 ENCOUNTER — Other Ambulatory Visit (HOSPITAL_BASED_OUTPATIENT_CLINIC_OR_DEPARTMENT_OTHER): Payer: Medicare Other | Admitting: Lab

## 2012-04-18 ENCOUNTER — Ambulatory Visit (HOSPITAL_BASED_OUTPATIENT_CLINIC_OR_DEPARTMENT_OTHER): Payer: Medicare Other

## 2012-04-18 ENCOUNTER — Ambulatory Visit: Payer: Medicare Other | Admitting: Pharmacist

## 2012-04-18 VITALS — BP 143/81 | HR 101 | Temp 97.2°F | Ht 65.0 in | Wt 193.1 lb

## 2012-04-18 DIAGNOSIS — Z7901 Long term (current) use of anticoagulants: Secondary | ICD-10-CM

## 2012-04-18 DIAGNOSIS — C3410 Malignant neoplasm of upper lobe, unspecified bronchus or lung: Secondary | ICD-10-CM

## 2012-04-18 DIAGNOSIS — I82409 Acute embolism and thrombosis of unspecified deep veins of unspecified lower extremity: Secondary | ICD-10-CM

## 2012-04-18 DIAGNOSIS — C3490 Malignant neoplasm of unspecified part of unspecified bronchus or lung: Secondary | ICD-10-CM

## 2012-04-18 DIAGNOSIS — Z5181 Encounter for therapeutic drug level monitoring: Secondary | ICD-10-CM

## 2012-04-18 DIAGNOSIS — Z5111 Encounter for antineoplastic chemotherapy: Secondary | ICD-10-CM

## 2012-04-18 DIAGNOSIS — R928 Other abnormal and inconclusive findings on diagnostic imaging of breast: Secondary | ICD-10-CM

## 2012-04-18 LAB — COMPREHENSIVE METABOLIC PANEL
Alkaline Phosphatase: 81 U/L (ref 39–117)
Creatinine, Ser: 0.97 mg/dL (ref 0.50–1.35)
Glucose, Bld: 135 mg/dL — ABNORMAL HIGH (ref 70–99)
Sodium: 138 mEq/L (ref 135–145)
Total Bilirubin: 0.3 mg/dL (ref 0.3–1.2)
Total Protein: 6.6 g/dL (ref 6.0–8.3)

## 2012-04-18 LAB — CBC WITH DIFFERENTIAL/PLATELET
Eosinophils Absolute: 0.1 10*3/uL (ref 0.0–0.5)
LYMPH%: 20.5 % (ref 14.0–49.0)
MCH: 30 pg (ref 27.2–33.4)
MCHC: 31.6 g/dL — ABNORMAL LOW (ref 32.0–36.0)
MCV: 95 fL (ref 79.3–98.0)
MONO%: 15.1 % — ABNORMAL HIGH (ref 0.0–14.0)
NEUT#: 2.9 10*3/uL (ref 1.5–6.5)
NEUT%: 61.8 % (ref 39.0–75.0)
Platelets: 274 10*3/uL (ref 140–400)
RBC: 4.16 10*6/uL — ABNORMAL LOW (ref 4.20–5.82)
RDW: 14.8 % — ABNORMAL HIGH (ref 11.0–14.6)

## 2012-04-18 LAB — POCT INR: INR: 2.4

## 2012-04-18 LAB — PROTIME-INR
INR: 2.4 (ref 2.00–3.50)
Protime: 28.8 Seconds — ABNORMAL HIGH (ref 10.6–13.4)

## 2012-04-18 MED ORDER — CYANOCOBALAMIN 1000 MCG/ML IJ SOLN: 1000.0000 ug | Freq: Once | INTRAMUSCULAR | Status: DC

## 2012-04-18 MED ORDER — SODIUM CHLORIDE 0.9 % IV SOLN
Freq: Once | INTRAVENOUS | Status: AC
  Administered 2012-04-18: 11:00:00 via INTRAVENOUS

## 2012-04-18 MED ORDER — SODIUM CHLORIDE 0.9 % IV SOLN
500.0000 mg/m2 | Freq: Once | INTRAVENOUS | Status: AC
  Administered 2012-04-18: 1000 mg via INTRAVENOUS
  Filled 2012-04-18: qty 40

## 2012-04-18 MED ORDER — ONDANSETRON 8 MG/50ML IVPB (CHCC)
8.0000 mg | Freq: Once | INTRAVENOUS | Status: AC
  Administered 2012-04-18: 8 mg via INTRAVENOUS

## 2012-04-18 MED ORDER — HEPARIN SOD (PORK) LOCK FLUSH 100 UNIT/ML IV SOLN
500.0000 [IU] | Freq: Once | INTRAVENOUS | Status: AC | PRN
  Administered 2012-04-18: 500 [IU]
  Filled 2012-04-18: qty 5

## 2012-04-18 MED ORDER — DEXAMETHASONE SODIUM PHOSPHATE 10 MG/ML IJ SOLN
10.0000 mg | Freq: Once | INTRAMUSCULAR | Status: AC
  Administered 2012-04-18: 10 mg via INTRAVENOUS

## 2012-04-18 MED ORDER — SODIUM CHLORIDE 0.9 % IJ SOLN
10.0000 mL | INTRAMUSCULAR | Status: DC | PRN
  Administered 2012-04-18: 10 mL
  Filled 2012-04-18: qty 10

## 2012-04-18 NOTE — Progress Notes (Signed)
INR = 2.4 on 7.5 mg/day No new complaints today.  Getting tx today. Cont same Coumadin dose. Repeat protime in 3 weeks w/ next tx. Marily Lente, Pharm.D.

## 2012-04-18 NOTE — Patient Instructions (Signed)
[facility] Discharge Instructions for Patients Receiving Chemotherapy  Today you received the following chemotherapy agents ALIMTA To help prevent nausea and vomiting after your treatment, we encourage you to take your nausea medication  Begin taking it and take it as often as prescribed    If you develop nausea and vomiting that is not controlled by your nausea medication, call the clinic. If it is after clinic hours your family physician or the after hours number for the clinic or go to the Emergency Department.   BELOW ARE SYMPTOMS THAT SHOULD BE REPORTED IMMEDIATELY:  *FEVER GREATER THAN 100.5 F  *CHILLS WITH OR WITHOUT FEVER  NAUSEA AND VOMITING THAT IS NOT CONTROLLED WITH YOUR NAUSEA MEDICATION  *UNUSUAL SHORTNESS OF BREATH  *UNUSUAL BRUISING OR BLEEDING  TENDERNESS IN MOUTH AND THROAT WITH OR WITHOUT PRESENCE OF ULCERS  *URINARY PROBLEMS  *BOWEL PROBLEMS  UNUSUAL RASH Items with * indicate a potential emergency and should be followed up as soon as possible.  One of the nurses will contact you 24 hours after your treatment. Please let the nurse know about any problems that you may have experienced. Feel free to call the clinic you have any questions or concerns. The clinic phone number is [phone].   I have been informed and understand all the instructions given to me. I know to contact the clinic, my physician, or go to the Emergency Department if any problems should occur. I do not have any questions at this time, but understand that I may call the clinic during office hours   should I have any questions or need assistance in obtaining follow up care.    __________________________________________  _____________  __________ Signature of Patient or Authorized Representative            Date                   Time    __________________________________________ Nurse's Signature

## 2012-04-19 NOTE — Progress Notes (Signed)
[facility] Telephone:([phone]   Fax:([phone]  OFFICE PROGRESS NOTE  Dorrene German, MD, MD [address] [place] [address]  DIAGNOSIS: Metastatic non-small cell lung cancer, adenocarcinoma, diagnosed in June 2012.   PRIOR THERAPY:  1. Status post left Pleurx catheter placement by Dr. Edwyna Shell on February 08, 2011 for drainage of left sided pleural effusion. 2. Status post 4 cycles of induction chemotherapy with carboplatin for AUC of 6, paclitaxel 200 mg per meter square and Avastin 15 mg/kg given every 3 weeks according to the ECOG protocol 5508 with stable disease. 3. Status post 1 cycle of maintenance Avastin 15 mg/kg given on May 19, 2011, discontinued secondary to gastrointestinal bleed.  CURRENT THERAPY: Maintenance chemotherapy with Alimta 500 mg/m2 every 3 weeks. Status post 7 cycles   INTERVAL HISTORY: Johnathan Bowman 78 y.o. male returns to the clinic today for followup visit accompanied by his daughter. He is feeling fine today except for back pain that he describes as beginning at the upper portion of his lower back up into the shoulder area. He also complains of left nipple sensitivity and pain that has been present for the past week. He has a history of a shingles outbreak that is affected the right anterior chest area. He is been taking his Vicodin a bit more regularly and requests a refill. He also continues to have left lower extremity swelling due to recurrent DVT in this leg.  He denied having any significant shortness of breath. Has no cough or hemoptysis. He is tolerating his treatment fairly well. He denied having any significant weight loss.   MEDICAL HISTORY: Past Medical History  Diagnosis Date  . Hypertension   . Diabetes mellitus   . Dyslipidemia   . lung ca 11/12/2010  . Mini stroke   . Degenerative disc disease   . GERD (gastroesophageal reflux disease)   . Osteopenia 2005  . Coronary artery disease 2005    treated  medically  . BPH (benign prostatic hyperplasia)   . DVT (deep venous thrombosis)     ALLERGIES:  is allergic to penicillins.  MEDICATIONS:  Current Outpatient Prescriptions  Medication Sig Dispense Refill  . acetaminophen (TYLENOL) 500 MG tablet Take 500 mg by mouth every 6 (six) hours as needed. For pain.      Marland Kitchen amLODipine-benazepril (LOTREL) 5-10 MG per capsule Take 1 capsule by mouth daily.        Marland Kitchen aspirin 81 MG tablet Take 81 mg by mouth daily.       . CYANOCOBALAMIN IJ Inject 1,000 mcg as directed once.       Marland Kitchen dexamethasone (DECADRON) 4 MG tablet Take 4 mg by mouth 2 (two) times daily with a meal. 1 tab po bid, the day before, the day of, and the day after chemotherapy      . folic acid (FOLVITE) 1 MG tablet Take 1 tablet (1 mg total) by mouth daily.  30 tablet  5  . glyBURIDE-metformin (GLUCOVANCE) 5-500 MG per tablet Take 2 tablets by mouth 2 (two) times daily.        Marland Kitchen HYDROcodone-acetaminophen (VICODIN) 5-500 MG per tablet Take 1 tablet by mouth every 6 (six) hours as needed. For pain.  30 tablet  0  . lidocaine-prilocaine (EMLA) cream Apply 1 application topically as needed. Apply to [facility] A Cath site before chemotherapy.       . pantoprazole (PROTONIX) 40 MG tablet Take 1 tablet (40 mg total) by  mouth 2 (two) times daily.  30 tablet  3  . warfarin (COUMADIN) 5 MG tablet Take 7.5 mg by mouth daily. One and one-half tablets       No current facility-administered medications for this visit.   Facility-Administered Medications Ordered in Other Visits  Medication Dose Route Frequency Provider Last Rate Last Dose  . sodium chloride 0.9 % injection 10 mL  10 mL Intracatheter PRN Si Gaul, MD   10 mL at 01/04/12 1008  . DISCONTD: sodium chloride 0.9 % injection 10 mL  10 mL Intracatheter PRN Si Gaul, MD   10 mL at 04/18/12 1225    SURGICAL HISTORY:  Past Surgical History  Procedure Date  . No past surgeries     REVIEW OF SYSTEMS:  A comprehensive review of  systems was negative except for: Constitutional: positive for fatigue Cardiovascular: positive for lower extremity edema Integument/breast: positive for Hypersensitivity of the left nipple with discomfort/pain for the past week. Musculoskeletal: positive for back pain   PHYSICAL EXAMINATION: General appearance: alert, cooperative and no distress Head: Normocephalic, without obvious abnormality, atraumatic Neck: no adenopathy Lymph nodes: Cervical, supraclavicular, and axillary nodes normal. Resp: clear to auscultation bilaterally Cardio: regular rate and rhythm, S1, S2 normal, no murmur, click, rub or gallop GI: soft, non-tender; bowel sounds normal; no masses,  no organomegaly Extremities: edema 1+ - 2+ pitting edema left lower extremity, mild erythema but no warmth, trace - 1+ pittine edema right lower extremity Neurologic: Alert and oriented X 3, normal strength and tone. Normal symmetric reflexes. Normal coordination and gait And is approximately met me a quarter to a half centimeter sized nodularity just behind and above the left areola, no nipple discharge or redness no discrete rashes noted  ECOG PERFORMANCE STATUS: 1 - Symptomatic but completely ambulatory  Blood pressure 143/81, pulse 101, temperature 97.2 F (36.2 C), temperature source Oral, height 5\' 5"  (1.651 m), weight 193 lb 1.6 oz (87.59 kg).  LABORATORY DATA: Lab Results  Component Value Date   WBC 4.6 04/18/2012   HGB 12.5* 04/18/2012   HCT 39.5 04/18/2012   MCV 95.0 04/18/2012   PLT 274 04/18/2012      Chemistry      Component Value Date/Time   NA 138 04/18/2012 0912   NA 139 11/07/2011 1621   K 4.4 04/18/2012 0912   K 4.8* 11/07/2011 1621   CL 103 04/18/2012 0912   CL 97* 11/07/2011 1621   CO2 27 04/18/2012 0912   CO2 30 11/07/2011 1621   BUN 13 04/18/2012 0912   BUN 11 11/07/2011 1621   CREATININE 0.97 04/18/2012 0912   CREATININE 0.9 11/07/2011 1621      Component Value Date/Time   CALCIUM 9.0 04/18/2012 0912   CALCIUM  9.4 11/07/2011 1621   ALKPHOS 81 04/18/2012 0912   ALKPHOS 100* 11/07/2011 1621   AST 21 04/18/2012 0912   AST 21 11/07/2011 1621   ALT 11 04/18/2012 0912   BILITOT 0.3 04/18/2012 0912   BILITOT 0.50 11/07/2011 1621       RADIOGRAPHIC STUDIES: Ct Chest W Contrast  03/23/2012  *RADIOLOGY REPORT*  Clinical Data:  Restaging lung cancer  CT CHEST, ABDOMEN AND PELVIS WITH CONTRAST  Technique:  Multidetector CT imaging of the chest, abdomen and pelvis was performed following the standard protocol during bolus administration of intravenous contrast.  Contrast:  Comparison:   None.  CT CHEST  Findings:  No enlarged axillary or supraclavicular lymph nodes.  AP window lymph node measures 1.7  cm, image 18.  Previously 1.4 cm.  Volume loss involving the left hemithorax again noted.  There is a high density material within the left pleural space which may be related to pleurodeses.  Mild nodular enhancement along the left posterior pleural space is identified.  For example, on image 29 there is a small pleural nodule which measures 0.8 cm. This is new from previous exam. Pleural nodule overlying the left upper lobe measures 0.8 cm, image 15.  Previously 0.6 cm.  Lungs appear emphysematous.  Review of the visualized bony structures is significant for mild thoracic spondylosis.  No aggressive lytic or sclerotic bone lesions noted.  IMPRESSION:  1.  Persistent partially loculated left pleural effusion and pleural nodularity.  When compared with the previous exam the mild left-sided pleural nodularity is slightly increased in the interval. 2.  Enlarged AP window lymph node is slightly increased in size from previous exam.  CT ABDOMEN AND PELVIS  Findings:  Stable low density structures within the liver parenchyma which are likely cysts.  The gallbladder appears normal.  Both adrenal glands appear normal.  The pancreas appears within normal limits.  The spleen appears within normal limits.  Bilateral renal hypodensities are  noted and appears similar to previous exam.  Likely cysts.  Large stone within the right renal collecting system is unchanged measuring 1.7 cm, image 62.  No upper abdominal adenopathy.  There is no pelvic or inguinal adenopathy.  Urinary bladder appears normal.  Bilateral enlargement of the seminal vesicles.  No free fluid or fluid collections within the abdomen or pelvis.  Review of the visualized bony structures is significant for degenerative disc disease within the lower lumbar spine.  IMPRESSION:  1.  Stable CT of the abdomen and pelvis. 2.  No specific features to suggest metastatic disease.  Original Report Authenticated By: Rosealee Albee, M.D.    ASSESSMENT/PLAN: This is a very pleasant 78 years old male with metastatic non-small cell lung cancer currently on maintenance chemotherapy with single agent Alimta status post 6 cycles. The patient is tolerating his treatment fairly well and CT scan of the chest abdomen and pelvis showed stable disease except for slightly increased size AP window lymph node. The patient was discussed with Dr. Arbutus Ped. He will proceed with cycle #8 of his maintenance chemotherapy with single agent Alimta. He will followup with the [place] [facility] Coumadin clinic regarding his Coumadin level in any dose adjustments. He'll followup in 3 weeks prior to his next cycle of maintenance chemotherapy with Alimta with a repeat CBC differential and C. met. He was given a prescription for Vicodin 5 500, one tablet by mouth every 6 hours as needed for pain, total of 60 tablets with no refill. He is to try warm compresses to the left nipple area to see if this helps with the discomfort. We will continue to monitor this area closely. Should the discomfort as nodularity persists she may require ultrasound for further evaluation.  Laural Benes, Ryann Leavitt E, PA-C   All questions were answered. The patient knows to call the clinic with any problems, questions or concerns. We can  certainly see the patient much sooner if necessary.

## 2012-05-01 ENCOUNTER — Encounter (HOSPITAL_COMMUNITY): Payer: Self-pay | Admitting: *Deleted

## 2012-05-01 ENCOUNTER — Emergency Department (HOSPITAL_COMMUNITY)
Admission: EM | Admit: 2012-05-01 | Discharge: 2012-05-02 | Disposition: A | Discharge status: Home / Self Care | Payer: Medicare Other | Attending: Emergency Medicine | Admitting: Emergency Medicine

## 2012-05-01 DIAGNOSIS — Z79899 Other long term (current) drug therapy: Secondary | ICD-10-CM | POA: Insufficient documentation

## 2012-05-01 DIAGNOSIS — I10 Essential (primary) hypertension: Secondary | ICD-10-CM | POA: Insufficient documentation

## 2012-05-01 DIAGNOSIS — L0390 Cellulitis, unspecified: Secondary | ICD-10-CM

## 2012-05-01 DIAGNOSIS — E119 Type 2 diabetes mellitus without complications: Secondary | ICD-10-CM | POA: Insufficient documentation

## 2012-05-01 DIAGNOSIS — E785 Hyperlipidemia, unspecified: Secondary | ICD-10-CM | POA: Insufficient documentation

## 2012-05-01 DIAGNOSIS — IMO0002 Reserved for concepts with insufficient information to code with codable children: Secondary | ICD-10-CM | POA: Insufficient documentation

## 2012-05-01 DIAGNOSIS — L03119 Cellulitis of unspecified part of limb: Secondary | ICD-10-CM | POA: Insufficient documentation

## 2012-05-01 DIAGNOSIS — Z86718 Personal history of other venous thrombosis and embolism: Secondary | ICD-10-CM | POA: Insufficient documentation

## 2012-05-01 DIAGNOSIS — I2510 Atherosclerotic heart disease of native coronary artery without angina pectoris: Secondary | ICD-10-CM | POA: Insufficient documentation

## 2012-05-01 DIAGNOSIS — M79609 Pain in unspecified limb: Secondary | ICD-10-CM

## 2012-05-01 DIAGNOSIS — M7989 Other specified soft tissue disorders: Secondary | ICD-10-CM

## 2012-05-01 DIAGNOSIS — L02419 Cutaneous abscess of limb, unspecified: Secondary | ICD-10-CM | POA: Insufficient documentation

## 2012-05-01 LAB — CBC
HCT: 34.8 % — ABNORMAL LOW (ref 39.0–52.0)
Hemoglobin: 11 g/dL — ABNORMAL LOW (ref 13.0–17.0)
MCHC: 31.6 g/dL (ref 30.0–36.0)
RBC: 3.71 MIL/uL — ABNORMAL LOW (ref 4.22–5.81)
WBC: 4 10*3/uL (ref 4.0–10.5)

## 2012-05-01 LAB — DIFFERENTIAL
Basophils Relative: 0 % (ref 0–1)
Lymphocytes Relative: 26 % (ref 12–46)
Monocytes Relative: 25 % — ABNORMAL HIGH (ref 3–12)
Neutro Abs: 1.8 10*3/uL (ref 1.7–7.7)
Neutrophils Relative %: 46 % (ref 43–77)

## 2012-05-01 LAB — APTT: aPTT: 73 seconds — ABNORMAL HIGH (ref 24–37)

## 2012-05-01 LAB — PROTIME-INR: Prothrombin Time: 26.1 seconds — ABNORMAL HIGH (ref 11.6–15.2)

## 2012-05-01 LAB — BASIC METABOLIC PANEL
BUN: 8 mg/dL (ref 6–23)
CO2: 29 mEq/L (ref 19–32)
Chloride: 99 mEq/L (ref 96–112)
GFR calc Af Amer: >90 mL/min (ref >90)
Potassium: 4.1 mEq/L (ref 3.5–5.1)

## 2012-05-01 MED ORDER — HYDROMORPHONE HCL PF 1 MG/ML IJ SOLN
1.0000 mg | Freq: Once | INTRAMUSCULAR | Status: AC
  Administered 2012-05-01: 1 mg via INTRAVENOUS
  Filled 2012-05-01: qty 1

## 2012-05-01 MED ORDER — VANCOMYCIN HCL IN DEXTROSE 1-5 GM/200ML-% IV SOLN
1000.0000 mg | Freq: Once | INTRAVENOUS | Status: AC
  Administered 2012-05-01: 1000 mg via INTRAVENOUS
  Filled 2012-05-01: qty 200

## 2012-05-01 MED ORDER — LEVOFLOXACIN 500 MG PO TABS: 500.0000 mg | ORAL_TABLET | Freq: Every day | ORAL | Status: AC

## 2012-05-01 NOTE — ED Provider Notes (Signed)
History     CSN: [id number]  Arrival date & time 05/01/12  1705   First MD Initiated Contact with Patient 05/01/12 1804      Chief Complaint  Patient presents with  . Leg Swelling    HPI Pt was diagnosed with a DVT in the last year.  He has been taking coumadin.  In the last two weeks the swelling has been increasing and the pain has been increasing.  The pain increases with walking and movement.  Pt has not seen his doctor for the worsening symptoms.  Pt had a vena cava filter placed but his leg has not been this swollen or painful in the past.   He denies chest pain, fever, coughing or shortness of breath. Past Medical History  Diagnosis Date  . Hypertension   . Diabetes mellitus   . Dyslipidemia   . lung ca 11/12/2010  . Mini stroke   . Degenerative disc disease   . GERD (gastroesophageal reflux disease)   . Osteopenia 2005  . Coronary artery disease 2005    treated medically  . BPH (benign prostatic hyperplasia)   . DVT (deep venous thrombosis)     Past Surgical History  Procedure Date  . No past surgeries     No family history on file.  History  Substance Use Topics  . Smoking status: Former Smoker -- 25 years    Quit date: 01/17/1986  . Smokeless tobacco: Never Used  . Alcohol Use: No      Review of Systems  All other systems reviewed and are negative.    Allergies  Penicillins  Home Medications   Current Outpatient Rx  Name Route Sig Dispense Refill  . ACETAMINOPHEN 500 MG PO TABS Oral Take 500 mg by mouth every 6 (six) hours as needed. For pain.    Marland Kitchen AMLODIPINE BESY-BENAZEPRIL HCL 5-10 MG PO CAPS Oral Take 1 capsule by mouth daily.      . ASPIRIN 81 MG PO TABS Oral Take 81 mg by mouth daily.     . CYANOCOBALAMIN IJ Injection Inject 1,000 mcg as directed once. Usually gets a dose when receiving chemo    . DEXAMETHASONE 4 MG PO TABS Oral Take 4 mg by mouth See admin instructions. 1 tab po bid, the day before, the day of, and the day after  chemotherapy. Next chemo 05/09/12    . FOLIC ACID 1 MG PO TABS Oral Take 1 tablet (1 mg total) by mouth daily. 30 tablet 5  . GLYBURIDE-METFORMIN 5-500 MG PO TABS Oral Take 2 tablets by mouth 2 (two) times daily.      Marland Kitchen HYDROCODONE-ACETAMINOPHEN 5-500 MG PO TABS Oral Take 1-2 tablets by mouth every 6 (six) hours as needed. For pain.    Marland Kitchen LIDOCAINE-PRILOCAINE 2.5-2.5 % EX CREA Topical Apply 1 application topically as needed. Apply to [facility] A Cath site before chemotherapy.     Marland Kitchen PANTOPRAZOLE SODIUM 40 MG PO TBEC Oral Take 1 tablet (40 mg total) by mouth 2 (two) times daily. 30 tablet 3  . WARFARIN SODIUM 5 MG PO TABS Oral Take 7.5 mg by mouth daily. One and one-half tablets      BP 136/76  Pulse 93  Temp(Src) 98.8 F (37.1 C) (Oral)  Resp 18  Wt 190 lb (86.183 kg)  SpO2 93%  Physical Exam  Nursing note and vitals reviewed. Constitutional: He appears well-developed and well-nourished. No distress.       obese  HENT:  Head: Normocephalic  and atraumatic.  Right Ear: External ear normal.  Left Ear: External ear normal.  Eyes: Conjunctivae are normal. Right eye exhibits no discharge. Left eye exhibits no discharge. No scleral icterus.  Neck: Neck supple. No tracheal deviation present.  Cardiovascular: Normal rate, regular rhythm and intact distal pulses.   Pulmonary/Chest: Effort normal and breath sounds normal. No stridor. No respiratory distress. He has no wheezes. He has no rales.  Abdominal: Soft. Bowel sounds are normal. He exhibits no distension. There is no tenderness. There is no rebound and no guarding.  Musculoskeletal: He exhibits edema and tenderness.       Left lower leg: He exhibits tenderness, swelling and edema (erythema).       Left foot: He exhibits tenderness and swelling.  Neurological: He is alert. He has normal strength. No sensory deficit. Cranial nerve deficit:  no gross defecits noted. He exhibits normal muscle tone. He displays no seizure activity. Coordination  normal.  Skin: Skin is warm and dry. No rash noted.  Psychiatric: He has a normal mood and affect.    ED Course  Procedures (including critical care time)  Labs Reviewed  CBC - Abnormal; Notable for the following:    RBC 3.71 (*)    Hemoglobin 11.0 (*)    HCT 34.8 (*)    All other components within normal limits  DIFFERENTIAL - Abnormal; Notable for the following:    Monocytes Relative 25 (*)    All other components within normal limits  BASIC METABOLIC PANEL - Abnormal; Notable for the following:    Glucose, Bld 122 (*)    GFR calc non Af Amer 81 (*)    All other components within normal limits  PROTIME-INR - Abnormal; Notable for the following:    Prothrombin Time 26.1 (*)    INR 2.35 (*)    All other components within normal limits  APTT - Abnormal; Notable for the following:    aPTT 73 (*)    All other components within normal limits   No results found.   1. DVT (deep venous thrombosis)       MDM  Pt's INR is therapeutic.  Will check doppler to see if his DVT is extending.  Cellulitis is a possibility although I doubt this is th case based on the exam.  There is no evidence of DVT on the Doppler ultrasound. Suspect the patient has a tonic component of swelling associated with his prior DVT but he could have an early cellulitis based on the erythema of his leg. Patient would prefer to go home. I will give him a prescription of antibiotics to take orally. He was given a dose of vancomycin here in the emergency department. I encouraged him to followup with his doctor this week to make sure that the symptoms are improving.        Celene Kras, MD 05/01/12 [phone]

## 2012-05-01 NOTE — ED Notes (Signed)
Pt states "have been taking coumadin for the blood clot, saw MD x 2 wks ago, it has started swelling and hurting more"; pt presents with redness & edema to RLE

## 2012-05-01 NOTE — Discharge Instructions (Signed)
Cellulitis Cellulitis is an infection of the tissue under the skin. The infected area is usually red and tender. This is caused by germs. These germs enter the body through cuts or sores. This usually happens in the arms or lower legs. HOME CARE   Take your medicine as told. Finish it even if you start to feel better.   If the infection is on the arm or leg, keep it raised (elevated).   Use a warm cloth on the infected area several times a day.   See your doctor for a follow-up visit as told.  GET HELP RIGHT AWAY IF:   You are tired or confused.   You throw up (vomit).   You have watery poop (diarrhea).   You feel ill and have muscle aches.   You have a fever.  MAKE SURE YOU:   Understand these instructions.   Will watch your condition.   Will get help right away if you are not doing well or get worse.  Document Released: 05/23/2008 Document Revised: 11/24/2011 Document Reviewed: 11/06/2009 [employer] Patient Information 2012 [employer]. 

## 2012-05-01 NOTE — Progress Notes (Signed)
VASCULAR LAB PRELIMINARY  PRELIMINARY  PRELIMINARY  PRELIMINARY  Left lower extremity venous duplex completed.    Preliminary report:  Left:  No evidence of DVT, superficial thrombosis, or Baker's cyst.  Norris Bodley D, RVS 05/01/2012, 10:25 PM

## 2012-05-02 MED ORDER — HEPARIN SOD (PORK) LOCK FLUSH 100 UNIT/ML IV SOLN
INTRAVENOUS | Status: AC
  Filled 2012-05-02: qty 5

## 2012-05-03 ENCOUNTER — Encounter: Payer: Self-pay | Admitting: Internal Medicine

## 2012-05-03 NOTE — Progress Notes (Signed)
Patient received one prescription from [facility] $3.84,her remaninig balance [facility] $393.16.

## 2012-05-09 ENCOUNTER — Ambulatory Visit: Payer: Medicare Other | Admitting: Pharmacist

## 2012-05-09 ENCOUNTER — Ambulatory Visit (HOSPITAL_BASED_OUTPATIENT_CLINIC_OR_DEPARTMENT_OTHER): Payer: Medicare Other | Admitting: Physician Assistant

## 2012-05-09 ENCOUNTER — Ambulatory Visit (HOSPITAL_BASED_OUTPATIENT_CLINIC_OR_DEPARTMENT_OTHER): Payer: Medicare Other

## 2012-05-09 ENCOUNTER — Encounter: Payer: Self-pay | Admitting: Physician Assistant

## 2012-05-09 ENCOUNTER — Telehealth: Payer: Self-pay | Admitting: Internal Medicine

## 2012-05-09 ENCOUNTER — Other Ambulatory Visit (HOSPITAL_BASED_OUTPATIENT_CLINIC_OR_DEPARTMENT_OTHER): Payer: Medicare Other | Admitting: Lab

## 2012-05-09 VITALS — BP 152/88 | HR 105 | Temp 97.8°F | Ht 65.0 in | Wt 191.7 lb

## 2012-05-09 DIAGNOSIS — C3490 Malignant neoplasm of unspecified part of unspecified bronchus or lung: Secondary | ICD-10-CM

## 2012-05-09 DIAGNOSIS — L988 Other specified disorders of the skin and subcutaneous tissue: Secondary | ICD-10-CM

## 2012-05-09 DIAGNOSIS — D649 Anemia, unspecified: Secondary | ICD-10-CM

## 2012-05-09 DIAGNOSIS — I82409 Acute embolism and thrombosis of unspecified deep veins of unspecified lower extremity: Secondary | ICD-10-CM

## 2012-05-09 DIAGNOSIS — Z5111 Encounter for antineoplastic chemotherapy: Secondary | ICD-10-CM

## 2012-05-09 DIAGNOSIS — C3410 Malignant neoplasm of upper lobe, unspecified bronchus or lung: Secondary | ICD-10-CM

## 2012-05-09 LAB — COMPREHENSIVE METABOLIC PANEL
ALT: 9 U/L (ref 0–53)
AST: 21 U/L (ref 0–37)
Albumin: 3.8 g/dL (ref 3.5–5.2)
Calcium: 9 mg/dL (ref 8.4–10.5)
Chloride: 105 mEq/L (ref 96–112)
Potassium: 4.8 mEq/L (ref 3.5–5.3)
Total Protein: 6.8 g/dL (ref 6.0–8.3)

## 2012-05-09 LAB — CBC WITH DIFFERENTIAL/PLATELET
BASO%: 0.8 % (ref 0.0–2.0)
HCT: 37.3 % — ABNORMAL LOW (ref 38.4–49.9)
MCHC: 30.8 g/dL — ABNORMAL LOW (ref 32.0–36.0)
MONO#: 0.5 10*3/uL (ref 0.1–0.9)
NEUT#: 3.4 10*3/uL (ref 1.5–6.5)
RBC: 3.96 10*6/uL — ABNORMAL LOW (ref 4.20–5.82)
WBC: 5.1 10*3/uL (ref 4.0–10.3)
lymph#: 1.1 10*3/uL (ref 0.9–3.3)
nRBC: 0 % (ref 0–0)

## 2012-05-09 MED ORDER — HEPARIN SOD (PORK) LOCK FLUSH 100 UNIT/ML IV SOLN
500.0000 [IU] | Freq: Once | INTRAVENOUS | Status: AC | PRN
  Administered 2012-05-09: 500 [IU]
  Filled 2012-05-09: qty 5

## 2012-05-09 MED ORDER — SODIUM CHLORIDE 0.9 % IV SOLN
500.0000 mg/m2 | Freq: Once | INTRAVENOUS | Status: AC
  Administered 2012-05-09: 1000 mg via INTRAVENOUS
  Filled 2012-05-09: qty 40

## 2012-05-09 MED ORDER — SODIUM CHLORIDE 0.9 % IJ SOLN
10.0000 mL | INTRAMUSCULAR | Status: DC | PRN
  Administered 2012-05-09: 10 mL
  Filled 2012-05-09: qty 10

## 2012-05-09 MED ORDER — DEXAMETHASONE SODIUM PHOSPHATE 10 MG/ML IJ SOLN
10.0000 mg | Freq: Once | INTRAMUSCULAR | Status: AC
  Administered 2012-05-09: 10 mg via INTRAVENOUS

## 2012-05-09 MED ORDER — ONDANSETRON 8 MG/50ML IVPB (CHCC)
8.0000 mg | Freq: Once | INTRAVENOUS | Status: AC
  Administered 2012-05-09: 8 mg via INTRAVENOUS

## 2012-05-09 MED ORDER — CYANOCOBALAMIN 1000 MCG/ML IJ SOLN
1000.0000 ug | Freq: Once | INTRAMUSCULAR | Status: AC
  Administered 2012-05-09: 1000 ug via INTRAMUSCULAR

## 2012-05-09 MED ORDER — SODIUM CHLORIDE 0.9 % IV SOLN
Freq: Once | INTRAVENOUS | Status: AC
  Administered 2012-05-09: 11:00:00 via INTRAVENOUS

## 2012-05-09 NOTE — Progress Notes (Signed)
Pt seen in infusion room.  INR today is slightly supratherapeutic (3.31 after send out).  No s/s of bleeding or bruising.  Only complaint is the pain in his B LE r/t cellulitis.  His intake of his pain medication has consequently increased.  He was recently prescribed Levaquin for a 10-day course (he started on Sunday) to treat his cellulitis.  Since his INR had to be sent out, was not able to confirm plan w patient prior to his departure from clinic.  I told him to expect to skip at least 1 dose, and that we would call to confirm the plan with him.  We will need to hold 1 dose of Coumadin since the Levaquin may increase his INR further, then resume Coumadin at 7.5mg  daily.  We will need to also recheck his INR in 1 week (05/16/12).  I have attempted multiple times today to reach him at his home and cell phone numbers, but his home phone has been disconnected, and he does not answer his cell phone.  I am unable to leave a message on his cell since his voicemail has not been set up.  Will re-attempt tomorrow to contact pt.

## 2012-05-09 NOTE — Telephone Encounter (Signed)
Gv pt appt for [id number]. sent email to michele to add chemo appt for 06/12

## 2012-05-09 NOTE — Patient Instructions (Signed)
[facility] Discharge Instructions for Patients Receiving Chemotherapy  Today you received the following chemotherapy agents alimta  To help prevent nausea and vomiting after your treatment, we encourage you to take your nausea medication  and take it as often as prescribed    If you develop nausea and vomiting that is not controlled by your nausea medication, call the clinic. If it is after clinic hours your family physician or the after hours number for the clinic or go to the Emergency Department.   BELOW ARE SYMPTOMS THAT SHOULD BE REPORTED IMMEDIATELY:  *FEVER GREATER THAN 100.5 F  *CHILLS WITH OR WITHOUT FEVER  NAUSEA AND VOMITING THAT IS NOT CONTROLLED WITH YOUR NAUSEA MEDICATION  *UNUSUAL SHORTNESS OF BREATH  *UNUSUAL BRUISING OR BLEEDING  TENDERNESS IN MOUTH AND THROAT WITH OR WITHOUT PRESENCE OF ULCERS  *URINARY PROBLEMS  *BOWEL PROBLEMS  UNUSUAL RASH Items with * indicate a potential emergency and should be followed up as soon as possible.  One of the nurses will contact you 24 hours after your treatment. Please let the nurse know about any problems that you may have experienced. Feel free to call the clinic you have any questions or concerns. The clinic phone number is [phone].   I have been informed and understand all the instructions given to me. I know to contact the clinic, my physician, or go to the Emergency Department if any problems should occur. I do not have any questions at this time, but understand that I may call the clinic during office hours   should I have any questions or need assistance in obtaining follow up care.    __________________________________________  _____________  __________ Signature of Patient or Authorized Representative            Date                   Time    __________________________________________ Nurse's Signature    

## 2012-05-09 NOTE — Progress Notes (Signed)
[facility] [phone]   [phone]  OFFICE PROGRESS NOTE  Dorrene German, MD, MD [address] [place] [address]  DIAGNOSIS: Metastatic non-small cell lung cancer, adenocarcinoma, diagnosed in June 2012.   PRIOR THERAPY:  1. Status post left Pleurx catheter placement by Dr. Edwyna Shell on February 08, 2011 for drainage of left sided pleural effusion. 2. Status post 4 cycles of induction chemotherapy with carboplatin for AUC of 6, paclitaxel 200 mg per meter square and Avastin 15 mg/kg given every 3 weeks according to the ECOG protocol 5508 with stable disease. 3. Status post 1 cycle of maintenance Avastin 15 mg/kg given on May 19, 2011, discontinued secondary to gastrointestinal bleed.  CURRENT THERAPY: Maintenance chemotherapy with Alimta 500 mg/m2 every 3 weeks. Status post 8 cycles   INTERVAL HISTORY: Johnathan Bowman 78 y.o. male returns to the clinic today for followup visit. Overall is tolerating his maintenance chemotherapy with single agent Alimta relatively well. He reports that she saw his doctor last week secondary to swelling redness and discomfort in the left lower extremity from the knee down to the foot. This placed on a 10 day course of antibiotics. He reports that he tripped and fell and hit his right lower rib cage on the handle of the jack when he was trying to recharge his car battery in his garage He continues to complain of nipple sensitivity but states it now affects both nipples.  He denied having any significant shortness of breath. Has no cough or hemoptysis.  He denied having any significant weight loss.   MEDICAL HISTORY: Past Medical History  Diagnosis Date  . Hypertension   . Diabetes mellitus   . Dyslipidemia   . lung ca 11/12/2010  . Mini stroke   . Degenerative disc disease   . GERD (gastroesophageal reflux disease)   . Osteopenia 2005  . Coronary artery disease 2005    treated medically  . BPH (benign  prostatic hyperplasia)   . DVT (deep venous thrombosis)     ALLERGIES:  is allergic to penicillins.  MEDICATIONS:  Current Outpatient Prescriptions  Medication Sig Dispense Refill  . acetaminophen (TYLENOL) 500 MG tablet Take 500 mg by mouth every 6 (six) hours as needed. For pain.      Marland Kitchen amLODipine-benazepril (LOTREL) 5-10 MG per capsule Take 1 capsule by mouth daily.        Marland Kitchen aspirin 81 MG tablet Take 81 mg by mouth daily.       . CYANOCOBALAMIN IJ Inject 1,000 mcg as directed once. Usually gets a dose when receiving chemo      . dexamethasone (DECADRON) 4 MG tablet Take 4 mg by mouth See admin instructions. 1 tab po bid, the day before, the day of, and the day after chemotherapy. Next chemo 05/09/12      . folic acid (FOLVITE) 1 MG tablet Take 1 tablet (1 mg total) by mouth daily.  30 tablet  5  . glyBURIDE-metformin (GLUCOVANCE) 5-500 MG per tablet Take 2 tablets by mouth 2 (two) times daily.        Marland Kitchen HYDROcodone-acetaminophen (VICODIN) 5-500 MG per tablet Take 1-2 tablets by mouth every 6 (six) hours as needed. For pain.      Marland Kitchen levofloxacin (LEVAQUIN) 500 MG tablet Take 1 tablet (500 mg total) by mouth daily.  10 tablet  0  . lidocaine-prilocaine (EMLA) cream Apply 1 application topically as needed. Apply to [facility] A Cath site before chemotherapy.       Marland Kitchen  pantoprazole (PROTONIX) 40 MG tablet Take 1 tablet (40 mg total) by mouth 2 (two) times daily.  30 tablet  3  . warfarin (COUMADIN) 5 MG tablet Take 7.5 mg by mouth daily. One and one-half tablets       No current facility-administered medications for this visit.   Facility-Administered Medications Ordered in Other Visits  Medication Dose Route Frequency Provider Last Rate Last Dose  . 0.9 %  sodium chloride infusion   Intravenous Once Si Gaul, MD      . cyanocobalamin ((VITAMIN B-12)) injection 1,000 mcg  1,000 mcg Intramuscular Once Si Gaul, MD   1,000 mcg at 05/09/12 1229  . dexamethasone (DECADRON) injection  10 mg  10 mg Intravenous Once Si Gaul, MD   10 mg at 05/09/12 1210  . heparin lock flush 100 unit/mL  500 Units Intracatheter Once PRN Si Gaul, MD   500 Units at 05/09/12 1258  . ondansetron (ZOFRAN) IVPB 8 mg  8 mg Intravenous Once Si Gaul, MD   8 mg at 05/09/12 1210  . PEMEtrexed (ALIMTA) 1,000 mg in sodium chloride 0.9 % 100 mL chemo infusion  500 mg/m2 (Treatment Plan Actual) Intravenous Once Si Gaul, MD   1,000 mg at 05/09/12 1242  . sodium chloride 0.9 % injection 10 mL  10 mL Intracatheter PRN Si Gaul, MD   10 mL at 01/04/12 1008  . sodium chloride 0.9 % injection 10 mL  10 mL Intracatheter PRN Si Gaul, MD   10 mL at 05/09/12 1258    SURGICAL HISTORY:  Past Surgical History  Procedure Date  . No past surgeries     REVIEW OF SYSTEMS:  A comprehensive review of systems was negative except for: Constitutional: positive for fatigue Cardiovascular: positive for lower extremity edema Integument/breast: positive for Hypersensitivity of both nipples with discomfort/pain for the past 3-4 weeks   PHYSICAL EXAMINATION: General appearance: alert, cooperative and no distress Head: Normocephalic, without obvious abnormality, atraumatic Neck: no adenopathy Lymph nodes: Cervical, supraclavicular, and axillary nodes normal. Resp: clear to auscultation bilaterally Cardio: regular rate and rhythm, S1, S2 normal, no murmur, click, rub or gallop GI: soft, non-tender; bowel sounds normal; no masses,  no organomegaly Extremities: edema 1+ - 2+ pitting edema left lower extremity, mild erythema but no warmth, trace - 1+ pittine edema right lower extremity Neurologic: Alert and oriented X 3, normal strength and tone. Normal symmetric reflexes. Normal coordination and gait Breast exam: The previously palpated nodularity behind and above the left areola is not palpable today and there is nothing palpable beneath the right areola. There are no breast masses and  nothing palpable in either axilla. They're no skin changes of concern.   ECOG PERFORMANCE STATUS: 1 - Symptomatic but completely ambulatory  Blood pressure 152/88, pulse 105, temperature 97.8 F (36.6 C), temperature source Oral, height 5\' 5"  (1.651 m), weight 191 lb 11.2 oz (86.955 kg).  LABORATORY DATA: Lab Results  Component Value Date   WBC 5.1 05/09/2012   HGB 11.5* 05/09/2012   HCT 37.3* 05/09/2012   MCV 94.2 05/09/2012   PLT 390 05/09/2012      Chemistry      Component Value Date/Time   NA 135 05/01/2012 1827   NA 139 11/07/2011 1621   K 4.1 05/01/2012 1827   K 4.8* 11/07/2011 1621   CL 99 05/01/2012 1827   CL 97* 11/07/2011 1621   CO2 29 05/01/2012 1827   CO2 30 11/07/2011 1621   BUN 8 05/01/2012 1827  BUN 11 11/07/2011 1621   CREATININE 0.84 05/01/2012 1827   CREATININE 0.9 11/07/2011 1621      Component Value Date/Time   CALCIUM 8.6 05/01/2012 1827   CALCIUM 9.4 11/07/2011 1621   ALKPHOS 81 04/18/2012 0912   ALKPHOS 100* 11/07/2011 1621   AST 21 04/18/2012 0912   AST 21 11/07/2011 1621   ALT 11 04/18/2012 0912   BILITOT 0.3 04/18/2012 0912   BILITOT 0.50 11/07/2011 1621       RADIOGRAPHIC STUDIES: Ct Chest W Contrast  03/23/2012  *RADIOLOGY REPORT*  Clinical Data:  Restaging lung cancer  CT CHEST, ABDOMEN AND PELVIS WITH CONTRAST  Technique:  Multidetector CT imaging of the chest, abdomen and pelvis was performed following the standard protocol during bolus administration of intravenous contrast.  Contrast:  Comparison:   None.  CT CHEST  Findings:  No enlarged axillary or supraclavicular lymph nodes.  AP window lymph node measures 1.7 cm, image 18.  Previously 1.4 cm.  Volume loss involving the left hemithorax again noted.  There is a high density material within the left pleural space which may be related to pleurodeses.  Mild nodular enhancement along the left posterior pleural space is identified.  For example, on image 29 there is a small pleural nodule which measures 0.8  cm. This is new from previous exam. Pleural nodule overlying the left upper lobe measures 0.8 cm, image 15.  Previously 0.6 cm.  Lungs appear emphysematous.  Review of the visualized bony structures is significant for mild thoracic spondylosis.  No aggressive lytic or sclerotic bone lesions noted.  IMPRESSION:  1.  Persistent partially loculated left pleural effusion and pleural nodularity.  When compared with the previous exam the mild left-sided pleural nodularity is slightly increased in the interval. 2.  Enlarged AP window lymph node is slightly increased in size from previous exam.  CT ABDOMEN AND PELVIS  Findings:  Stable low density structures within the liver parenchyma which are likely cysts.  The gallbladder appears normal.  Both adrenal glands appear normal.  The pancreas appears within normal limits.  The spleen appears within normal limits.  Bilateral renal hypodensities are noted and appears similar to previous exam.  Likely cysts.  Large stone within the right renal collecting system is unchanged measuring 1.7 cm, image 62.  No upper abdominal adenopathy.  There is no pelvic or inguinal adenopathy.  Urinary bladder appears normal.  Bilateral enlargement of the seminal vesicles.  No free fluid or fluid collections within the abdomen or pelvis.  Review of the visualized bony structures is significant for degenerative disc disease within the lower lumbar spine.  IMPRESSION:  1.  Stable CT of the abdomen and pelvis. 2.  No specific features to suggest metastatic disease.  Original Report Authenticated By: Rosealee Albee, M.D.    ASSESSMENT/PLAN: This is a very pleasant 78 years old male with metastatic non-small cell lung cancer currently on maintenance chemotherapy with single agent Alimta status post 6 cycles. The patient is tolerating his treatment fairly well and on his last restaging CT scan of the chest abdomen and pelvis showed stable disease except for slightly increased size AP window lymph  node. The patient was discussed with Dr. Arbutus Ped. He will proceed with cycle #9 of his maintenance chemotherapy with single agent Alimta. He will followup with the [facility] Coumadin clinic regarding his Coumadin level in any dose adjustments. He'll followup with Dr. Arbutus Ped in 3 weeks prior to his next cycle of maintenance  chemotherapy with Alimta with a repeat CBC differential and C. Met, as well as a CT of the chest abdomen and pelvis with contrast to reevaluate his disease.it is not clear what is causing his nipple sensitivity but we will continue to monitor the symptoms.   Laural Benes, Lillyen Schow E, PA-C   All questions were answered. The patient knows to call the clinic with any problems, questions or concerns. We can certainly see the patient much sooner if necessary.

## 2012-05-10 ENCOUNTER — Telehealth: Payer: Self-pay | Admitting: Pharmacist

## 2012-05-10 NOTE — Telephone Encounter (Signed)
Instructed pt to hold coumadin tomorrow since he has already taken today's dose and scheduled pt to return to Coumadin clinic on 05/15/12.

## 2012-05-15 ENCOUNTER — Ambulatory Visit: Payer: Medicare Other

## 2012-05-15 ENCOUNTER — Other Ambulatory Visit: Payer: Medicare Other | Admitting: Lab

## 2012-05-16 ENCOUNTER — Ambulatory Visit: Payer: Medicare Other | Admitting: Pharmacist

## 2012-05-16 ENCOUNTER — Other Ambulatory Visit (HOSPITAL_BASED_OUTPATIENT_CLINIC_OR_DEPARTMENT_OTHER): Payer: Medicare Other | Admitting: Lab

## 2012-05-16 DIAGNOSIS — C3490 Malignant neoplasm of unspecified part of unspecified bronchus or lung: Secondary | ICD-10-CM

## 2012-05-16 DIAGNOSIS — I82409 Acute embolism and thrombosis of unspecified deep veins of unspecified lower extremity: Secondary | ICD-10-CM

## 2012-05-16 DIAGNOSIS — Z5112 Encounter for antineoplastic immunotherapy: Secondary | ICD-10-CM

## 2012-05-16 LAB — COMPREHENSIVE METABOLIC PANEL
Albumin: 3.6 g/dL (ref 3.5–5.2)
CO2: 26 mEq/L (ref 19–32)
Calcium: 8.6 mg/dL (ref 8.4–10.5)
Glucose, Bld: 227 mg/dL — ABNORMAL HIGH (ref 70–99)
Sodium: 139 mEq/L (ref 135–145)
Total Bilirubin: 0.4 mg/dL (ref 0.3–1.2)
Total Protein: 6.1 g/dL (ref 6.0–8.3)

## 2012-05-16 LAB — CBC WITH DIFFERENTIAL/PLATELET
Basophils Absolute: 0 10*3/uL (ref 0.0–0.1)
Eosinophils Absolute: 0.1 10*3/uL (ref 0.0–0.5)
HGB: 11.4 g/dL — ABNORMAL LOW (ref 13.0–17.1)
MCV: 92.6 fL (ref 79.3–98.0)
MONO%: 10 % (ref 0.0–14.0)
NEUT#: 2.3 10*3/uL (ref 1.5–6.5)
Platelets: 269 10*3/uL (ref 140–400)
RDW: 15.5 % — ABNORMAL HIGH (ref 11.0–14.6)

## 2012-05-16 LAB — PROTIME-INR
INR: 2.3 (ref 2.00–3.50)
Protime: 27.6 Seconds — ABNORMAL HIGH (ref 10.6–13.4)

## 2012-05-16 NOTE — Progress Notes (Signed)
INR = 2.3 after holding x 1 dose for supratherapeutic INR.  Now back on maintenance dose of 7.5 mg/day. Pt w/o major complaints today.  Still some swelling in LLE.  Cellulitis is improved. INR at goal.  Cont same dose & return in 2 weeks.  We can see him during chemo on 6/12. Marily Lente, Pharm.D.

## 2012-05-28 ENCOUNTER — Ambulatory Visit (HOSPITAL_COMMUNITY)
Admission: RE | Admit: 2012-05-28 | Discharge: 2012-05-28 | Disposition: A | Discharge status: Home / Self Care | Payer: Medicare Other | Source: Ambulatory Visit | Attending: Physician Assistant | Admitting: Physician Assistant

## 2012-05-28 ENCOUNTER — Encounter (HOSPITAL_COMMUNITY): Payer: Self-pay

## 2012-05-28 DIAGNOSIS — Z9221 Personal history of antineoplastic chemotherapy: Secondary | ICD-10-CM | POA: Insufficient documentation

## 2012-05-28 DIAGNOSIS — R0789 Other chest pain: Secondary | ICD-10-CM | POA: Insufficient documentation

## 2012-05-28 DIAGNOSIS — C782 Secondary malignant neoplasm of pleura: Secondary | ICD-10-CM | POA: Insufficient documentation

## 2012-05-28 DIAGNOSIS — J90 Pleural effusion, not elsewhere classified: Secondary | ICD-10-CM | POA: Insufficient documentation

## 2012-05-28 DIAGNOSIS — R0602 Shortness of breath: Secondary | ICD-10-CM | POA: Insufficient documentation

## 2012-05-28 DIAGNOSIS — K7689 Other specified diseases of liver: Secondary | ICD-10-CM | POA: Insufficient documentation

## 2012-05-28 DIAGNOSIS — N200 Calculus of kidney: Secondary | ICD-10-CM | POA: Insufficient documentation

## 2012-05-28 DIAGNOSIS — I2510 Atherosclerotic heart disease of native coronary artery without angina pectoris: Secondary | ICD-10-CM | POA: Insufficient documentation

## 2012-05-28 DIAGNOSIS — J438 Other emphysema: Secondary | ICD-10-CM | POA: Insufficient documentation

## 2012-05-28 DIAGNOSIS — K5730 Diverticulosis of large intestine without perforation or abscess without bleeding: Secondary | ICD-10-CM | POA: Insufficient documentation

## 2012-05-28 DIAGNOSIS — I700 Atherosclerosis of aorta: Secondary | ICD-10-CM | POA: Insufficient documentation

## 2012-05-28 DIAGNOSIS — R599 Enlarged lymph nodes, unspecified: Secondary | ICD-10-CM | POA: Insufficient documentation

## 2012-05-28 DIAGNOSIS — C3490 Malignant neoplasm of unspecified part of unspecified bronchus or lung: Secondary | ICD-10-CM

## 2012-05-28 MED ORDER — IOHEXOL 300 MG/ML  SOLN
100.0000 mL | Freq: Once | INTRAMUSCULAR | Status: AC | PRN
  Administered 2012-05-28: 100 mL via INTRAVENOUS

## 2012-05-30 ENCOUNTER — Ambulatory Visit (HOSPITAL_BASED_OUTPATIENT_CLINIC_OR_DEPARTMENT_OTHER): Payer: Medicare Other

## 2012-05-30 ENCOUNTER — Ambulatory Visit (HOSPITAL_BASED_OUTPATIENT_CLINIC_OR_DEPARTMENT_OTHER): Payer: Medicare Other | Admitting: Internal Medicine

## 2012-05-30 ENCOUNTER — Other Ambulatory Visit (HOSPITAL_BASED_OUTPATIENT_CLINIC_OR_DEPARTMENT_OTHER): Payer: Medicare Other | Admitting: Lab

## 2012-05-30 ENCOUNTER — Ambulatory Visit: Payer: Medicare Other | Admitting: Pharmacist

## 2012-05-30 VITALS — BP 150/83 | HR 107 | Temp 97.7°F

## 2012-05-30 VITALS — BP 142/68 | HR 84 | Temp 98.1°F | Ht 65.0 in | Wt 192.3 lb

## 2012-05-30 DIAGNOSIS — C3410 Malignant neoplasm of upper lobe, unspecified bronchus or lung: Secondary | ICD-10-CM

## 2012-05-30 DIAGNOSIS — Z5111 Encounter for antineoplastic chemotherapy: Secondary | ICD-10-CM

## 2012-05-30 DIAGNOSIS — Z7901 Long term (current) use of anticoagulants: Secondary | ICD-10-CM

## 2012-05-30 DIAGNOSIS — C3490 Malignant neoplasm of unspecified part of unspecified bronchus or lung: Secondary | ICD-10-CM

## 2012-05-30 DIAGNOSIS — C801 Malignant (primary) neoplasm, unspecified: Secondary | ICD-10-CM

## 2012-05-30 DIAGNOSIS — I82409 Acute embolism and thrombosis of unspecified deep veins of unspecified lower extremity: Secondary | ICD-10-CM

## 2012-05-30 LAB — CBC WITH DIFFERENTIAL/PLATELET
BASO%: 0.4 % (ref 0.0–2.0)
EOS%: 2.2 % (ref 0.0–7.0)
LYMPH%: 20.9 % (ref 14.0–49.0)
MCH: 28.7 pg (ref 27.2–33.4)
MCHC: 30.4 g/dL — ABNORMAL LOW (ref 32.0–36.0)
MCV: 94.3 fL (ref 79.3–98.0)
MONO%: 10.8 % (ref 0.0–14.0)
Platelets: 298 10*3/uL (ref 140–400)
RBC: 4.04 10*6/uL — ABNORMAL LOW (ref 4.20–5.82)
WBC: 7.2 10*3/uL (ref 4.0–10.3)
nRBC: 0 % (ref 0–0)

## 2012-05-30 LAB — COMPREHENSIVE METABOLIC PANEL
CO2: 27 mEq/L (ref 19–32)
Calcium: 8.7 mg/dL (ref 8.4–10.5)
Chloride: 102 mEq/L (ref 96–112)
Creatinine, Ser: 0.97 mg/dL (ref 0.50–1.35)
Glucose, Bld: 170 mg/dL — ABNORMAL HIGH (ref 70–99)
Total Bilirubin: 0.3 mg/dL (ref 0.3–1.2)

## 2012-05-30 LAB — POCT INR: INR: 3

## 2012-05-30 MED ORDER — ONDANSETRON 8 MG/50ML IVPB (CHCC)
8.0000 mg | Freq: Once | INTRAVENOUS | Status: AC
  Administered 2012-05-30: 8 mg via INTRAVENOUS

## 2012-05-30 MED ORDER — DEXAMETHASONE SODIUM PHOSPHATE 10 MG/ML IJ SOLN
10.0000 mg | Freq: Once | INTRAMUSCULAR | Status: AC
  Administered 2012-05-30: 10 mg via INTRAVENOUS

## 2012-05-30 MED ORDER — DOCETAXEL CHEMO INJECTION 160 MG/16ML
75.0000 mg/m2 | Freq: Once | INTRAVENOUS | Status: AC
  Administered 2012-05-30: 150 mg via INTRAVENOUS
  Filled 2012-05-30: qty 15

## 2012-05-30 MED ORDER — HEPARIN SOD (PORK) LOCK FLUSH 100 UNIT/ML IV SOLN
500.0000 [IU] | Freq: Once | INTRAVENOUS | Status: AC | PRN
  Administered 2012-05-30: 500 [IU]
  Filled 2012-05-30: qty 5

## 2012-05-30 MED ORDER — MORPHINE SULFATE ER 30 MG PO TBCR: 30.0000 mg | EXTENDED_RELEASE_TABLET | Freq: Two times a day (BID) | ORAL | Status: DC

## 2012-05-30 MED ORDER — DEXAMETHASONE 4 MG PO TABS: 4.0000 mg | ORAL_TABLET | ORAL | Status: AC

## 2012-05-30 MED ORDER — SODIUM CHLORIDE 0.9 % IJ SOLN
10.0000 mL | INTRAMUSCULAR | Status: DC | PRN
  Administered 2012-05-30: 10 mL
  Filled 2012-05-30: qty 10

## 2012-05-30 MED ORDER — SODIUM CHLORIDE 0.9 % IV SOLN
Freq: Once | INTRAVENOUS | Status: AC
  Administered 2012-05-30: 13:00:00 via INTRAVENOUS

## 2012-05-30 NOTE — Progress Notes (Signed)
[facility] [phone]) [phone]   [phone]  OFFICE PROGRESS NOTE  Dorrene German, MD [address] [place] [address]  DIAGNOSIS: Metastatic non-small cell lung cancer, adenocarcinoma, diagnosed in June 2012.   PRIOR THERAPY:  1. Status post left Pleurx catheter placement by Dr. Edwyna Shell on February 08, 2011 for drainage of left sided pleural effusion. 2. Status post 4 cycles of induction chemotherapy with carboplatin for AUC of 6, paclitaxel 200 mg per meter square and Avastin 15 mg/kg given every 3 weeks according to the ECOG protocol 5508 with stable disease. 3. Status post 1 cycle of maintenance Avastin 15 mg/kg given on May 19, 2011, discontinued secondary to gastrointestinal bleed. 4. Maintenance chemotherapy with Alimta 500 mg/m2 every 3 weeks. Status post 9 cycles, discontinued today secondary to disease progression.   CURRENT THERAPY: The patient will start today the first cycle of systemic chemotherapy with docetaxel 75 mg/M2 every 3 weeks with Neulasta support.  INTERVAL HISTORY: Johnathan Bowman 78 y.o. male returns to the clinic today for followup visit accompanied by his daughter. The patient is doing fine today with no specific complaints except for the neuropathic pain from the previous herpes zoster on his right scapular area. He tolerated his previous treatment with Alimta fairly well except for mild fatigue. He start complaining of pain on the left rib cage area. He denied having any significant nausea or vomiting The patient has repeat CT scan of the chest, abdomen and pelvis performed recently and he is here today for evaluation and discussion of his scan results.   MEDICAL HISTORY: Past Medical History  Diagnosis Date  . Hypertension   . Diabetes mellitus   . Dyslipidemia   . lung ca 11/12/2010  . Mini stroke   . Degenerative disc disease   . GERD (gastroesophageal reflux disease)   . Osteopenia 2005  . Coronary artery  disease 2005    treated medically  . BPH (benign prostatic hyperplasia)   . DVT (deep venous thrombosis)     ALLERGIES:  is allergic to penicillins.  MEDICATIONS:  Current Outpatient Prescriptions  Medication Sig Dispense Refill  . acetaminophen (TYLENOL) 500 MG tablet Take 500 mg by mouth every 6 (six) hours as needed. For pain.      Marland Kitchen amLODipine-benazepril (LOTREL) 5-10 MG per capsule Take 1 capsule by mouth daily.        Marland Kitchen aspirin 81 MG tablet Take 81 mg by mouth daily.       . CYANOCOBALAMIN IJ Inject 1,000 mcg as directed once. Usually gets a dose when receiving chemo      . dexamethasone (DECADRON) 4 MG tablet Take 4 mg by mouth See admin instructions. 1 tab po bid, the day before, the day of, and the day after chemotherapy. Next chemo 05/09/12      . folic acid (FOLVITE) 1 MG tablet Take 1 tablet (1 mg total) by mouth daily.  30 tablet  5  . glyBURIDE-metformin (GLUCOVANCE) 5-500 MG per tablet Take 2 tablets by mouth 2 (two) times daily.        Marland Kitchen HYDROcodone-acetaminophen (VICODIN) 5-500 MG per tablet Take 1-2 tablets by mouth every 6 (six) hours as needed. For pain.      Marland Kitchen lidocaine-prilocaine (EMLA) cream Apply 1 application topically as needed. Apply to [facility] A Cath site before chemotherapy.       . pantoprazole (PROTONIX) 40 MG tablet Take 1 tablet (40 mg total) by mouth 2 (two)  times daily.  30 tablet  3  . warfarin (COUMADIN) 5 MG tablet Take 7.5 mg by mouth daily. One and one-half tablets      . morphine (MS CONTIN) 30 MG 12 hr tablet Take 1 tablet (30 mg total) by mouth 2 (two) times daily.  60 tablet  0    SURGICAL HISTORY:  Past Surgical History  Procedure Date  . No past surgeries     REVIEW OF SYSTEMS:  A comprehensive review of systems was negative except for: Constitutional: positive for fatigue Respiratory: positive for dyspnea on exertion and pleurisy/chest pain Neurological: positive for paresthesia   PHYSICAL EXAMINATION: General appearance: alert,  cooperative, fatigued and no distress Head: Normocephalic, without obvious abnormality, atraumatic Neck: no adenopathy Lymph nodes: Cervical, supraclavicular, and axillary nodes normal. Resp: diminished breath sounds RLL Cardio: regular rate and rhythm, S1, S2 normal, no murmur, click, rub or gallop GI: soft, non-tender; bowel sounds normal; no masses,  no organomegaly Extremities: extremities normal, atraumatic, no cyanosis or edema Neurologic: Alert and oriented X 3, normal strength and tone. Normal symmetric reflexes. Normal coordination and gait  ECOG PERFORMANCE STATUS: 1 - Symptomatic but completely ambulatory  Blood pressure 142/68, pulse 84, temperature 98.1 F (36.7 C), temperature source Oral, height 5\' 5"  (1.651 m), weight 192 lb 4.8 oz (87.227 kg).  LABORATORY DATA: Lab Results  Component Value Date   WBC 7.2 05/30/2012   HGB 11.6* 05/30/2012   HCT 38.1* 05/30/2012   MCV 94.3 05/30/2012   PLT 298 05/30/2012      Chemistry      Component Value Date/Time   NA 139 05/16/2012 0816   NA 139 11/07/2011 1621   K 4.1 05/16/2012 0816   K 4.8* 11/07/2011 1621   CL 104 05/16/2012 0816   CL 97* 11/07/2011 1621   CO2 26 05/16/2012 0816   CO2 30 11/07/2011 1621   BUN 15 05/16/2012 0816   BUN 11 11/07/2011 1621   CREATININE 1.01 05/16/2012 0816   CREATININE 0.9 11/07/2011 1621      Component Value Date/Time   CALCIUM 8.6 05/16/2012 0816   CALCIUM 9.4 11/07/2011 1621   ALKPHOS 75 05/16/2012 0816   ALKPHOS 100* 11/07/2011 1621   AST 21 05/16/2012 0816   AST 21 11/07/2011 1621   ALT 12 05/16/2012 0816   BILITOT 0.4 05/16/2012 0816   BILITOT 0.50 11/07/2011 1621       RADIOGRAPHIC STUDIES: Ct Chest W Contrast  05/28/2012  *RADIOLOGY REPORT*  Clinical Data:  History of lung cancer diagnosed in January 2012 status post chemotherapy.  Shortness of breath.  Posterior chest tightness.  CT CHEST, ABDOMEN AND PELVIS WITH CONTRAST  Technique:  Multidetector CT imaging of the chest, abdomen  and pelvis was performed following the standard protocol during bolus administration of intravenous contrast.  Contrast: OMNIPAQUE IOHEXOL 300 MG/ML  SOLN  Comparison:  CT chest 03/23/2012.  CT of the chest abdomen and pelvis 11/07/2011.   CT CHEST  Findings:  Mediastinum: There has been interval enlargement of an AP window lymph node which currently measures 19 mm (previously 17 mm) short axis.  Borderline enlarged anterior mediastinal lymph node has slightly increased and currently measures 1 cm in short axis. Heart size is upper limits of normal. There is no significant pericardial fluid, thickening or pericardial calcification.  Esophagus is unremarkable in appearance. There is atherosclerosis of the thoracic aorta, the great vessels of the mediastinum and the coronary arteries, including calcified atherosclerotic plaque in the distal  left main, left anterior descending, left circumflex and right coronary arteries. There is a right-sided internal jugular single lumen Port-A-Cath with tip terminating in the distal superior vena cava.  Lungs/Pleura: Left upper lobe paramediastinal mass (image 25 of series 5) appears slightly larger than the prior examination, currently measuring 1.8 x 3.3 cm.  Pleural based nodularity throughout the left hemithorax appears slightly increased compared to the prior study, and is clearly increased compared to more remote prior examinations.  Specific examples included a pleural based nodule in the periphery of the left upper lobe (image 17 of series 5) that has increased in thickness to 10 mm on today's examination (previously 8 mm). Another example as a pleural-based nodule in the periphery of the left upper lobe (image 20 of series 5) which currently measures 1 cm (previously 5 mm).  The chronic left-sided pleural effusion has slightly increased in size and demonstrates increasing pleural thickening and nodular enhancement when compared to the prior examinations. Some  chronic pleural based calcifications are also noted the base of the left hemithorax (unchanged).  Unchanged 4 mm nodule in the superior segment of the right lower lobe (image 21 of series 5).  Mild paraseptal emphysema in the apices of the lungs bilaterally.  Area of interstitial prominence in the dependent portion of the right lower lobe which is increased compared to prior examinations, but has an appearance most suggestive of scarring (potentially from recurrent episodes of aspiration).  Musculoskeletal: There are no aggressive appearing lytic or blastic lesions noted in the visualized portions of the skeleton.  IMPRESSION:  1.  Findings, as above, consistent with progression of disease, including enlargement of the primary left upper lobe mass, increasing pleural-based metastases in the left hemithorax, and enlarging left AP window and anterior mediastinal adenopathy. 2. Atherosclerosis, including left main and three-vessel coronary artery disease. Please note that although the presence of coronary artery calcium documents the presence of coronary artery disease, the severity of this disease and any potential stenosis cannot be assessed on this non-gated CT examination.  Assessment for potential risk factor modification, dietary therapy or pharmacologic therapy may be warranted, if clinically indicated. 3.  Additional incidental findings, as above.   CT ABDOMEN AND PELVIS  Findings:  Abdomen/Pelvis: In segment 2 of the liver there are two small low attenuation lesions which are similar to prior studies, and compatible with cysts, the largest of which measures 2.3 x 1.6 cm. No other definite aggressive appearing hepatic lesions are noted on today's examination.  The enhanced appearance of the gallbladder, pancreas, spleen and bilateral adrenal glands is unremarkable.  In the right renal collecting system and there is a large calculus which measures 17 mm. Multiple low attenuation renal lesions are compatible  with cysts, the largest of which is in the right kidney measuring 4.6 cm.  Several renal lesions are too small to definitively characterize, but are similar to prior examinations. An IVC filter is in place with tip terminating just below the level of the renal veins bilaterally.  There is atherosclerosis of the abdominal and pelvic vasculature, without evidence of aneurysm or dissection at this time.  There is no ascites or pneumoperitoneum and no pathologic distension of bowel.  No definite pathologic lymphadenopathy identified within the abdomen or pelvis.  There is extensive colonic diverticulosis, most pronounced in the region of the sigmoid colon, without surrounding inflammatory changes to suggest an acute diverticulitis at this time.  Normal appendix.  Prostate and urinary bladder are unremarkable in appearance.  Musculoskeletal:  There are no aggressive appearing lytic or blastic lesions noted in the visualized portions of the skeleton.  IMPRESSION:  1.  No findings to suggest metastatic disease to the abdomen or pelvis on today's examination. 2.  Multiple renal lesions are again noted bilaterally, all of which appears similar to the prior examination.  Several of these are too small to definitively characterize, with the larger lesions all compatible with simple cysts. 3.  In addition, there is a 17 mm nonobstructive calculus in the interpolar collecting system of the right kidney. 4.  Extensive colonic diverticulosis without evidence to suggest acute diverticulitis. 5.  Hepatic cysts in segment 2 of the liver are unchanged compared to prior examinations. 6.  Additional incidental findings, as above.  Original Report Authenticated By: Florencia Reasons, M.D.    ASSESSMENT: This is a very pleasant 78 years old African American male with metastatic non-small cell lung cancer recently treated with 3 cycles of chemotherapy with docetaxel but unfortunately has evidence for disease progression involving the  left pulmonary mass as well as the pleural-based masses.   PLAN: I discussed the scan results with the patient and his daughter. I gave him several options for treatment including systemic chemotherapy with single agent docetaxel versus oral Tarceva. After discussion of the 2 options and adverse effects, the patient decided to proceed with intravenous chemotherapy with single agent docetaxel. I discussed with him the adverse effects of this treatment including but not limited to alopecia, myelosuppression, nausea and vomiting, peripheral neuropathy, liver or renal dysfunction. I will also referred the patient to addition oncology for consideration of palliative radiotherapy to the left pleural based mass, causing the left-sided chest pain. The patient will start the first cycle of his systemic chemotherapy today. He would come back for followup visit in 3 weeks with the start of cycle #2. He was advised to call me immediately if he has any concerning symptoms in the interval. For pain management I started additional and MS Contin 30 mg by mouth every 12 hours. He was also advised to take Vicodin for breakthrough pain on as needed basis.  All questions were answered. The patient knows to call the clinic with any problems, questions or concerns. We can certainly see the patient much sooner if necessary.  I spent 20 minutes counseling the patient face to face. The total time spent in the appointment was 30 minutes.

## 2012-05-30 NOTE — Patient Instructions (Addendum)
[facility] Discharge Instructions for Patients Receiving Chemotherapy  Today you received the following chemotherapy agents Taxotere.  To help prevent nausea and vomiting after your treatment, we encourage you to take your nausea medication.  If you develop nausea and vomiting that is not controlled by your nausea medication, call the clinic. If it is after clinic hours your family physician or the after hours number for the clinic or go to the Emergency Department.   BELOW ARE SYMPTOMS THAT SHOULD BE REPORTED IMMEDIATELY:  *FEVER GREATER THAN 100.5 F  *CHILLS WITH OR WITHOUT FEVER  NAUSEA AND VOMITING THAT IS NOT CONTROLLED WITH YOUR NAUSEA MEDICATION  *UNUSUAL SHORTNESS OF BREATH  *UNUSUAL BRUISING OR BLEEDING  TENDERNESS IN MOUTH AND THROAT WITH OR WITHOUT PRESENCE OF ULCERS  *URINARY PROBLEMS  *BOWEL PROBLEMS  UNUSUAL RASH Items with * indicate a potential emergency and should be followed up as soon as possible.  One of the nurses will contact you 24 hours after your treatment. Please let the nurse know about any problems that you may have experienced. Feel free to call the clinic you have any questions or concerns. The clinic phone number is [phone].   I have been informed and understand all the instructions given to me. I know to contact the clinic, my physician, or go to the Emergency Department if any problems should occur. I do not have any questions at this time, but understand that I may call the clinic during office hours   should I have any questions or need assistance in obtaining follow up care.    __________________________________________  _____________  __________ Signature of Patient or Authorized Representative            Date                   Time    __________________________________________ Nurse's Signature    

## 2012-05-30 NOTE — Patient Instructions (Signed)
Decadron instructions:   Take 2 tablets by mouth two times daily day before day of and after chemo Folic Acid instructions:  Okay to stop taking once you run out of your current prescription.       Docetaxel/Taxotere  What is this medicine?  DOCETAXEL (doe se TAX el) is a chemotherapy drug. It targets fast dividing cells, like cancer cells, and causes these cells to die. This medicine is used to treat many types of cancers like breast cancer, certain stomach cancers, head and neck cancer, lung cancer, and prostate cancer. This medicine may be used for other purposes; ask your health care provider or pharmacist if you have questions. What should I tell my health care provider before I take this medicine? They need to know if you have any of these conditions: -infection (especially a virus infection such as chickenpox, cold sores, or herpes) -liver disease -low blood counts, like low white cell, platelet, or red cell counts -an unusual or allergic reaction to docetaxel, polysorbate 80, other chemotherapy agents, other medicines, foods, dyes, or preservatives -pregnant or trying to get pregnant -breast-feeding How should I use this medicine? This drug is given as an infusion into a vein. It is administered in a hospital or clinic by a specially trained health care professional. Talk to your pediatrician regarding the use of this medicine in children. Special care may be needed. Overdosage: If you think you have taken too much of this medicine contact a poison control center or emergency room at once. NOTE: This medicine is only for you. Do not share this medicine with others. What if I miss a dose? It is important not to miss your dose. Call your doctor or health care professional if you are unable to keep an appointment. What may interact with this medicine? -cyclosporine -erythromycin -ketoconazole -medicines to increase blood counts like filgrastim, pegfilgrastim,  sargramostim -vaccines Talk to your doctor or health care professional before taking any of these medicines: -acetaminophen -aspirin -ibuprofen -ketoprofen -naproxen This list may not describe all possible interactions. Give your health care provider a list of all the medicines, herbs, non-prescription drugs, or dietary supplements you use. Also tell them if you smoke, drink alcohol, or use illegal drugs. Some items may interact with your medicine. What should I watch for while using this medicine? Your condition will be monitored carefully while you are receiving this medicine. You will need important blood work done while you are taking this medicine. This drug may make you feel generally unwell. This is not uncommon, as chemotherapy can affect healthy cells as well as cancer cells. Report any side effects. Continue your course of treatment even though you feel ill unless your doctor tells you to stop. In some cases, you may be given additional medicines to help with side effects. Follow all directions for their use. Call your doctor or health care professional for advice if you get a fever, chills or sore throat, or other symptoms of a cold or flu. Do not treat yourself. This drug decreases your body's ability to fight infections. Try to avoid being around people who are sick. This medicine may increase your risk to bruise or bleed. Call your doctor or health care professional if you notice any unusual bleeding. Be careful brushing and flossing your teeth or using a toothpick because you may get an infection or bleed more easily. If you have any dental work done, tell your dentist you are receiving this medicine. Avoid taking products that contain aspirin, acetaminophen,  ibuprofen, naproxen, or ketoprofen unless instructed by your doctor. These medicines may hide a fever. Do not become pregnant while taking this medicine. Women should inform their doctor if they wish to become pregnant or think  they might be pregnant. There is a potential for serious side effects to an unborn child. Talk to your health care professional or pharmacist for more information. Do not breast-feed an infant while taking this medicine. What side effects may I notice from receiving this medicine? Side effects that you should report to your doctor or health care professional as soon as possible: -allergic reactions like skin rash, itching or hives, swelling of the face, lips, or tongue -low blood counts - This drug may decrease the number of white blood cells, red blood cells and platelets. You may be at increased risk for infections and bleeding. -signs of infection - fever or chills, cough, sore throat, pain or difficulty passing urine -signs of decreased platelets or bleeding - bruising, pinpoint red spots on the skin, black, tarry stools, nosebleeds -signs of decreased red blood cells - unusually weak or tired, fainting spells, lightheadedness -breathing problems -fast or irregular heartbeat -low blood pressure -mouth sores -nausea and vomiting -pain, swelling, redness or irritation at the injection site -pain, tingling, numbness in the hands or feet -swelling of the ankle, feet, hands -weight gain Side effects that usually do not require medical attention (report to your prescriber or health care professional if they continue or are bothersome): -bone pain -complete hair loss including hair on your head, underarms, pubic hair, eyebrows, and eyelashes -diarrhea -excessive tearing -changes in the color of fingernails -loosening of the fingernails -nausea -muscle pain -red flush to skin -sweating -weak or tired This list may not describe all possible side effects. Call your doctor for medical advice about side effects. You may report side effects to FDA at 1-800-FDA-1088. Where should I keep my medicine? This drug is given in a hospital or clinic and will not be stored at home. NOTE: This sheet is a  summary. It may not cover all possible information. If you have questions about this medicine, talk to your doctor, pharmacist, or health care provider.  2012, [employer]. (11/17/2008 11:52:10 AM)

## 2012-05-30 NOTE — Progress Notes (Signed)
INR at goal.  Cont current dose of coumadin 7.5mg  daily, which pt has been stable on for a couple months.  Will check PT/INR with next Taxotere treatment in 3 weeks.  Per note from initiating of coumadin, pt to remain on Coumadin for 6 months.  Stop date will be end of July.

## 2012-05-31 ENCOUNTER — Ambulatory Visit (HOSPITAL_BASED_OUTPATIENT_CLINIC_OR_DEPARTMENT_OTHER): Payer: Medicare Other

## 2012-05-31 ENCOUNTER — Telehealth: Payer: Self-pay | Admitting: *Deleted

## 2012-05-31 ENCOUNTER — Telehealth: Payer: Self-pay | Admitting: Internal Medicine

## 2012-05-31 VITALS — BP 154/82 | HR 101 | Temp 97.7°F

## 2012-05-31 DIAGNOSIS — C3410 Malignant neoplasm of upper lobe, unspecified bronchus or lung: Secondary | ICD-10-CM

## 2012-05-31 DIAGNOSIS — C801 Malignant (primary) neoplasm, unspecified: Secondary | ICD-10-CM

## 2012-05-31 DIAGNOSIS — C3490 Malignant neoplasm of unspecified part of unspecified bronchus or lung: Secondary | ICD-10-CM

## 2012-05-31 MED ORDER — PEGFILGRASTIM INJECTION 6 MG/0.6ML
6.0000 mg | Freq: Once | SUBCUTANEOUS | Status: AC
  Administered 2012-05-31: 6 mg via SUBCUTANEOUS
  Filled 2012-05-31: qty 0.6

## 2012-05-31 NOTE — Telephone Encounter (Signed)
Patient states that he is doing well.  No nausea, vomiting, or diarrhea.  Eating well and drinking some, but needs to increase fluids.  Know to call if has any problems, concerns or questions.

## 2012-05-31 NOTE — Telephone Encounter (Signed)
[facility] pt dtr appt schedule for June thru August. Pt already on schedule to see Dr. Dayton Scrape and is aware of that appt.

## 2012-06-01 ENCOUNTER — Encounter: Payer: Self-pay | Admitting: Internal Medicine

## 2012-06-01 ENCOUNTER — Encounter: Payer: Self-pay | Admitting: Radiation Oncology

## 2012-06-01 NOTE — Progress Notes (Signed)
Patient received medication from [facility] 6.94 remaining balance 373.63

## 2012-06-01 NOTE — Progress Notes (Signed)
PATIENT RECEIVED MEDICATIONS FROM [facility] 05/30/12 12.59 REMAINING BALANCE 380.57

## 2012-06-05 ENCOUNTER — Ambulatory Visit
Admission: RE | Admit: 2012-06-05 | Discharge: 2012-06-05 | Disposition: A | Discharge status: Home / Self Care | Payer: Medicare Other | Source: Ambulatory Visit | Attending: Radiation Oncology | Admitting: Radiation Oncology

## 2012-06-05 ENCOUNTER — Encounter: Payer: Self-pay | Admitting: Radiation Oncology

## 2012-06-05 ENCOUNTER — Other Ambulatory Visit (HOSPITAL_BASED_OUTPATIENT_CLINIC_OR_DEPARTMENT_OTHER): Payer: Medicare Other | Admitting: Lab

## 2012-06-05 VITALS — BP 125/77 | HR 108 | Temp 99.2°F | Resp 20 | Ht 65.0 in | Wt 190.1 lb

## 2012-06-05 DIAGNOSIS — I10 Essential (primary) hypertension: Secondary | ICD-10-CM | POA: Insufficient documentation

## 2012-06-05 DIAGNOSIS — Z79899 Other long term (current) drug therapy: Secondary | ICD-10-CM | POA: Insufficient documentation

## 2012-06-05 DIAGNOSIS — D649 Anemia, unspecified: Secondary | ICD-10-CM

## 2012-06-05 DIAGNOSIS — C801 Malignant (primary) neoplasm, unspecified: Secondary | ICD-10-CM

## 2012-06-05 DIAGNOSIS — C3490 Malignant neoplasm of unspecified part of unspecified bronchus or lung: Secondary | ICD-10-CM | POA: Insufficient documentation

## 2012-06-05 DIAGNOSIS — Z86718 Personal history of other venous thrombosis and embolism: Secondary | ICD-10-CM | POA: Insufficient documentation

## 2012-06-05 DIAGNOSIS — E119 Type 2 diabetes mellitus without complications: Secondary | ICD-10-CM | POA: Insufficient documentation

## 2012-06-05 DIAGNOSIS — Z510 Encounter for antineoplastic radiation therapy: Secondary | ICD-10-CM | POA: Insufficient documentation

## 2012-06-05 DIAGNOSIS — K219 Gastro-esophageal reflux disease without esophagitis: Secondary | ICD-10-CM | POA: Insufficient documentation

## 2012-06-05 DIAGNOSIS — Z7901 Long term (current) use of anticoagulants: Secondary | ICD-10-CM | POA: Insufficient documentation

## 2012-06-05 HISTORY — DX: Calculus of kidney: N20.0

## 2012-06-05 HISTORY — DX: Diverticulosis of intestine, part unspecified, without perforation or abscess without bleeding: K57.90

## 2012-06-05 LAB — COMPREHENSIVE METABOLIC PANEL
Albumin: 3 g/dL — ABNORMAL LOW (ref 3.5–5.2)
Alkaline Phosphatase: 112 U/L (ref 39–117)
BUN: 16 mg/dL (ref 6–23)
CO2: 26 mEq/L (ref 19–32)
Glucose, Bld: 289 mg/dL — ABNORMAL HIGH (ref 70–99)
Potassium: 3.9 mEq/L (ref 3.5–5.3)
Total Bilirubin: 0.2 mg/dL — ABNORMAL LOW (ref 0.3–1.2)

## 2012-06-05 LAB — CBC WITH DIFFERENTIAL/PLATELET
BASO%: 0.6 % (ref 0.0–2.0)
Basophils Absolute: 0.1 10*3/uL (ref 0.0–0.1)
EOS%: 0.9 % (ref 0.0–7.0)
HGB: 11.8 g/dL — ABNORMAL LOW (ref 13.0–17.1)
MCH: 28.9 pg (ref 27.2–33.4)
MONO%: 8.2 % (ref 0.0–14.0)
RBC: 4.1 10*6/uL — ABNORMAL LOW (ref 4.20–5.82)
RDW: 15.7 % — ABNORMAL HIGH (ref 11.0–14.6)
lymph#: 1.2 10*3/uL (ref 0.9–3.3)

## 2012-06-05 NOTE — Progress Notes (Signed)
[facility] Radiation Oncology NEW PATIENT EVALUATION  Name: Johnathan Bowman MRN: [id number]  Date:   06/05/2012           DOB: 1929/09/05  Status: outpatient   CC: Johnathan German, MD  Johnathan Gaul, MD    REFERRING PHYSICIAN: Si Gaul, MD   DIAGNOSIS:  Stage IV non-small cell carcinoma of the lung  HISTORY OF PRESENT ILLNESS:  Johnathan Bowman is a 78 y.o. male who is seen today for the courtesy of Dr. Arbutus Ped for consideration of palliative radiotherapy to his left chest in the management of his non-small cell carcinoma of the left lung. He was diagnosed with adenocarcinoma of the left lung in January 2012. He presented with dyspnea and left-sided chest pain. He underwent a Pleurx catheter placement Dr. Edwyna Shell in February 2012 for drainage of a left-sided pleural effusion. He want to receive 4 cycles of chemotherapy with Dr. Arbutus Ped along with one cycle of Avastin which was discontinued secondary to GI bleed. He went on to receive maintenance Alimta chemotherapy which was recently discontinued for disease progression based on his CT scan of the chest and abdomen on 05/28/2012. Scans of the chest/abdomen showed enlargement of the primary left upper lobe mass along with increasing pleural-based metastases in the left hemithorax enlarging left AP window and anterior mediastinal adenopathy. There is no evidence of metastatic disease to his abdomen/pelvis. He had a nice palliative response to his chemotherapy and no longer required supplemental oxygen. However, over the past 2 months he's been having worsening left posterior lateral chest discomfort which is at times as severe as 8/10. He had been taking hydrocodone when necessary but was recently started on MS Contin 30 mg by mouth twice a day for worsening pain.  The pain occasionally radiates to his anterior chest. He does report dyspnea on exertion. Dr. Shirline Frees is now starting docetaxel as a single agent.  PREVIOUS  RADIATION THERAPY: No   PAST MEDICAL HISTORY:  has a past medical history of Hypertension; Diabetes mellitus; Dyslipidemia; lung ca (11/12/2010); Mini stroke (2005); Degenerative disc disease; GERD (gastroesophageal reflux disease); Osteopenia (2005); Coronary artery disease (2005); BPH (benign prostatic hyperplasia); DVT (deep venous thrombosis); History of chemotherapy; Diverticulosis; and Calculus of kidney.     PAST SURGICAL HISTORY:  Past Surgical History  Procedure Date  . No past surgeries   . Pleurex cath 02/08/11    left-sided pleural effusion     FAMILY HISTORY: His father died following a motor vehicle accident in his 2s. His mother died in her 77s from complications of Alzheimer's disease.   SOCIAL HISTORY:  reports that he quit smoking about 26 years ago. He has never used smokeless tobacco. He reports that he does not drink alcohol or use illicit drugs. Married, 3 children, 3 stepchildren. He worked as an Journalist, newspaper.   ALLERGIES: Penicillins   MEDICATIONS:  Current Outpatient Prescriptions  Medication Sig Dispense Refill  . acetaminophen (TYLENOL) 500 MG tablet Take 500 mg by mouth every 6 (six) hours as needed. For pain.      Marland Kitchen amLODipine-benazepril (LOTREL) 5-10 MG per capsule Take 1 capsule by mouth daily.        Marland Kitchen aspirin 81 MG tablet Take 81 mg by mouth daily.       . CYANOCOBALAMIN IJ Inject 1,000 mcg as directed once. Usually gets a dose when receiving chemo      . dexamethasone (DECADRON) 4 MG tablet Take 1 tablet (4 mg total) by mouth as  directed. 2 tablets PO BID day before, day of, and day after chemo.  40 tablet  1  . folic acid (FOLVITE) 1 MG tablet Take 1 tablet (1 mg total) by mouth daily.  30 tablet  5  . glyBURIDE-metformin (GLUCOVANCE) 5-500 MG per tablet Take 2 tablets by mouth 2 (two) times daily.        Marland Kitchen ibuprofen (ADVIL,MOTRIN) 200 MG tablet Take 200 mg by mouth every 6 (six) hours as needed.      . lidocaine-prilocaine (EMLA) cream Apply 1  application topically as needed. Apply to [facility] A Cath site before chemotherapy.       Marland Kitchen morphine (MS CONTIN) 30 MG 12 hr tablet Take 1 tablet (30 mg total) by mouth 2 (two) times daily.  60 tablet  0  . pantoprazole (PROTONIX) 40 MG tablet Take 1 tablet (40 mg total) by mouth 2 (two) times daily.  30 tablet  3  . warfarin (COUMADIN) 5 MG tablet Take 7.5 mg by mouth daily. One and one-half tablets      . HYDROcodone-acetaminophen (VICODIN) 5-500 MG per tablet Take 1-2 tablets by mouth every 6 (six) hours as needed. For pain.         REVIEW OF SYSTEMS:  Pertinent items are noted in HPI.    PHYSICAL EXAM:  height is 5\' 5"  (1.651 m) and weight is 190 lb 1.6 oz (86.229 kg). His oral temperature is 99.2 F (37.3 C). His blood pressure is 125/77 and his pulse is 108. His respiration is 20.    Alert and oriented. He is not in any respiratory distress. Head and neck examination grossly unremarkable. Nodes: Without palpable cervical or supraclavicular lymphadenopathy. Chest: There are diminished breath sounds along both lung bases, left greater than right.  There is discomfort described along the left lower posterior lateral chest. Heart: Regular in rhythm. Back: Without spinal or CVA tenderness. Abdomen soft without masses organomegaly. Neurologic simulation: Grossly nonfocal.    LABORATORY DATA:  Lab Results  Component Value Date   WBC 10.2 06/05/2012   HGB 11.8* 06/05/2012   HCT 37.8* 06/05/2012   MCV 92.2 06/05/2012   PLT 203 06/05/2012   Lab Results  Component Value Date   NA 135 06/05/2012   K 3.9 06/05/2012   CL 98 06/05/2012   CO2 26 06/05/2012   Lab Results  Component Value Date   ALT 14 06/05/2012   AST 17 06/05/2012   ALKPHOS 112 06/05/2012   BILITOT 0.2* 06/05/2012      IMPRESSION:  non-small cell carcinoma/adenocarcinoma of the left lung with disease progression. At that he is symptomatic from his pleural-based disease, it would offer him a 2-3 week course of palliative  radiotherapy in addition to his docetaxel chemotherapy. I discussed the potential acute and late toxicities of radiation therapy and he wishes to proceed as outlined. I will not treat his mediastinum since I do not believe that he is symptomatic from his central disease, and thus his radiation therapy should be well tolerated. Consent is signed today. He'll return tomorrow morning for simulation/treatment planning.    PLAN: as discussed above.    I spent 60 minutes minutes face to face with the patient and more than 50% of that time was spent in counseling and/or coordination of care.

## 2012-06-05 NOTE — Progress Notes (Signed)
Please see the Nurse Progress Note in the MD Initial Consult Encounter for this patient. 

## 2012-06-06 ENCOUNTER — Other Ambulatory Visit: Payer: Self-pay | Admitting: Pharmacist

## 2012-06-06 ENCOUNTER — Ambulatory Visit
Admission: RE | Admit: 2012-06-06 | Discharge: 2012-06-06 | Disposition: A | Discharge status: Home / Self Care | Payer: Medicare Other | Source: Ambulatory Visit | Attending: Radiation Oncology | Admitting: Radiation Oncology

## 2012-06-06 ENCOUNTER — Other Ambulatory Visit: Payer: Medicare Other | Admitting: Lab

## 2012-06-06 DIAGNOSIS — I82409 Acute embolism and thrombosis of unspecified deep veins of unspecified lower extremity: Secondary | ICD-10-CM

## 2012-06-06 DIAGNOSIS — C3490 Malignant neoplasm of unspecified part of unspecified bronchus or lung: Secondary | ICD-10-CM

## 2012-06-06 NOTE — Addendum Note (Signed)
Encounter addended by: Glennie Hawk, RN on: 06/06/2012  2:35 PM<BR>     Documentation filed: Charges VN

## 2012-06-06 NOTE — Progress Notes (Signed)
Simulation/treatment planning note: The patient was taken to the CT simulator. He was placed on a wing board with his arms extended. He was then scanned. I contoured his pleural-based disease along his left posterior lateral chest. He was set up to LAO and RPO fields. 2 separate multileaf collimators were designed to conform the field. I prescribing 3500 cGy in 14 sessions utilizing 10 MV photons. An isodose plan and dosimetry are requested.

## 2012-06-13 ENCOUNTER — Ambulatory Visit
Admission: RE | Admit: 2012-06-13 | Discharge: 2012-06-13 | Disposition: A | Discharge status: Home / Self Care | Payer: Medicare Other | Source: Ambulatory Visit | Attending: Radiation Oncology | Admitting: Radiation Oncology

## 2012-06-13 ENCOUNTER — Other Ambulatory Visit (HOSPITAL_BASED_OUTPATIENT_CLINIC_OR_DEPARTMENT_OTHER): Payer: Medicare Other | Admitting: Lab

## 2012-06-13 ENCOUNTER — Ambulatory Visit
Admission: RE | Admit: 2012-06-13 | Payer: Medicare Other | Source: Ambulatory Visit | Attending: Radiation Oncology | Admitting: Radiation Oncology

## 2012-06-13 DIAGNOSIS — C3410 Malignant neoplasm of upper lobe, unspecified bronchus or lung: Secondary | ICD-10-CM

## 2012-06-13 DIAGNOSIS — C3490 Malignant neoplasm of unspecified part of unspecified bronchus or lung: Secondary | ICD-10-CM

## 2012-06-13 LAB — CBC WITH DIFFERENTIAL/PLATELET
Basophils Absolute: 0.1 10*3/uL (ref 0.0–0.1)
Eosinophils Absolute: 0 10*3/uL (ref 0.0–0.5)
HGB: 11.5 g/dL — ABNORMAL LOW (ref 13.0–17.1)
MCV: 92.3 fL (ref 79.3–98.0)
MONO#: 0.7 10*3/uL (ref 0.1–0.9)
MONO%: 5.1 % (ref 0.0–14.0)
NEUT#: 10.9 10*3/uL — ABNORMAL HIGH (ref 1.5–6.5)
RBC: 4 10*6/uL — ABNORMAL LOW (ref 4.20–5.82)
RDW: 16.5 % — ABNORMAL HIGH (ref 11.0–14.6)
WBC: 12.8 10*3/uL — ABNORMAL HIGH (ref 4.0–10.3)

## 2012-06-13 LAB — COMPREHENSIVE METABOLIC PANEL
Albumin: 3.4 g/dL — ABNORMAL LOW (ref 3.5–5.2)
Alkaline Phosphatase: 99 U/L (ref 39–117)
BUN: 9 mg/dL (ref 6–23)
CO2: 24 mEq/L (ref 19–32)
Calcium: 8.2 mg/dL — ABNORMAL LOW (ref 8.4–10.5)
Chloride: 102 mEq/L (ref 96–112)
Glucose, Bld: 253 mg/dL — ABNORMAL HIGH (ref 70–99)
Potassium: 4.5 mEq/L (ref 3.5–5.3)
Sodium: 138 mEq/L (ref 135–145)
Total Protein: 6 g/dL (ref 6.0–8.3)

## 2012-06-14 ENCOUNTER — Ambulatory Visit
Admission: RE | Admit: 2012-06-14 | Discharge: 2012-06-14 | Disposition: A | Discharge status: Home / Self Care | Payer: Medicare Other | Source: Ambulatory Visit | Attending: Radiation Oncology | Admitting: Radiation Oncology

## 2012-06-14 DIAGNOSIS — C3490 Malignant neoplasm of unspecified part of unspecified bronchus or lung: Secondary | ICD-10-CM

## 2012-06-14 NOTE — Progress Notes (Signed)
Post sim ed completed w/pt and daughter; gave pt "Radiation and You" booklet. All questions answered.

## 2012-06-15 ENCOUNTER — Ambulatory Visit
Admission: RE | Admit: 2012-06-15 | Discharge: 2012-06-15 | Disposition: A | Discharge status: Home / Self Care | Payer: Medicare Other | Source: Ambulatory Visit | Attending: Radiation Oncology | Admitting: Radiation Oncology

## 2012-06-18 ENCOUNTER — Encounter: Payer: Self-pay | Admitting: Radiation Oncology

## 2012-06-18 ENCOUNTER — Ambulatory Visit
Admission: RE | Admit: 2012-06-18 | Discharge: 2012-06-18 | Disposition: A | Discharge status: Home / Self Care | Payer: Medicare Other | Source: Ambulatory Visit | Attending: Radiation Oncology | Admitting: Radiation Oncology

## 2012-06-18 VITALS — BP 127/74 | HR 100 | Temp 98.5°F | Resp 20 | Wt 188.4 lb

## 2012-06-18 DIAGNOSIS — C3490 Malignant neoplasm of unspecified part of unspecified bronchus or lung: Secondary | ICD-10-CM

## 2012-06-18 NOTE — Progress Notes (Signed)
Weekly Management Note:  Site:L Chest Current Dose:  750  cGy Projected Dose: 3500  cGy  Narrative: The patient is seen today for routine under treatment assessment. CBCT/MVCT images/port films were reviewed. The chart was reviewed.   He does report mild fatigue. No change in his pain. He continues with his MS Contin 30 mg by mouth twice a day.  Physical Examination:  Filed Vitals:   06/18/12 1039  BP: 127/74  Pulse: 100  Temp: 98.5 F (36.9 C)  Resp: 20  .  Weight: 188 lb 6.4 oz (85.458 kg). No change.  Impression: Tolerating radiation therapy well.  Plan: Continue radiation therapy as planned.

## 2012-06-18 NOTE — Progress Notes (Signed)
Pt c/o fatigue, SOB w/minimal exertion, pain in back which is chronic. Morphine bid, Hydrocodone prn for which he needs refill. Denies cough but clears throat w/clear sputum at times.

## 2012-06-19 ENCOUNTER — Ambulatory Visit: Payer: Medicare Other

## 2012-06-20 ENCOUNTER — Ambulatory Visit (HOSPITAL_BASED_OUTPATIENT_CLINIC_OR_DEPARTMENT_OTHER): Payer: Medicare Other

## 2012-06-20 ENCOUNTER — Telehealth: Payer: Self-pay | Admitting: Internal Medicine

## 2012-06-20 ENCOUNTER — Other Ambulatory Visit (HOSPITAL_BASED_OUTPATIENT_CLINIC_OR_DEPARTMENT_OTHER): Payer: Medicare Other | Admitting: Lab

## 2012-06-20 ENCOUNTER — Ambulatory Visit
Admission: RE | Admit: 2012-06-20 | Discharge: 2012-06-20 | Disposition: A | Discharge status: Home / Self Care | Payer: Medicare Other | Source: Ambulatory Visit | Attending: Radiation Oncology | Admitting: Radiation Oncology

## 2012-06-20 ENCOUNTER — Ambulatory Visit: Payer: Medicare Other | Admitting: Pharmacist

## 2012-06-20 ENCOUNTER — Encounter: Payer: Self-pay | Admitting: Physician Assistant

## 2012-06-20 ENCOUNTER — Ambulatory Visit (HOSPITAL_BASED_OUTPATIENT_CLINIC_OR_DEPARTMENT_OTHER): Payer: Medicare Other | Admitting: Physician Assistant

## 2012-06-20 VITALS — BP 140/80 | HR 111 | Temp 97.9°F | Ht 65.0 in | Wt 188.5 lb

## 2012-06-20 DIAGNOSIS — C3410 Malignant neoplasm of upper lobe, unspecified bronchus or lung: Secondary | ICD-10-CM

## 2012-06-20 DIAGNOSIS — I82409 Acute embolism and thrombosis of unspecified deep veins of unspecified lower extremity: Secondary | ICD-10-CM

## 2012-06-20 DIAGNOSIS — C3490 Malignant neoplasm of unspecified part of unspecified bronchus or lung: Secondary | ICD-10-CM

## 2012-06-20 DIAGNOSIS — C801 Malignant (primary) neoplasm, unspecified: Secondary | ICD-10-CM

## 2012-06-20 DIAGNOSIS — Z5111 Encounter for antineoplastic chemotherapy: Secondary | ICD-10-CM

## 2012-06-20 DIAGNOSIS — C782 Secondary malignant neoplasm of pleura: Secondary | ICD-10-CM

## 2012-06-20 LAB — CBC WITH DIFFERENTIAL/PLATELET
Basophils Absolute: 0 10*3/uL (ref 0.0–0.1)
Eosinophils Absolute: 0 10*3/uL (ref 0.0–0.5)
HGB: 11.4 g/dL — ABNORMAL LOW (ref 13.0–17.1)
MCV: 93 fL (ref 79.3–98.0)
MONO%: 1.8 % (ref 0.0–14.0)
NEUT#: 6 10*3/uL (ref 1.5–6.5)
RBC: 3.99 10*6/uL — ABNORMAL LOW (ref 4.20–5.82)
RDW: 16.7 % — ABNORMAL HIGH (ref 11.0–14.6)
WBC: 6.6 10*3/uL (ref 4.0–10.3)
lymph#: 0.4 10*3/uL — ABNORMAL LOW (ref 0.9–3.3)
nRBC: 0 % (ref 0–0)

## 2012-06-20 LAB — COMPREHENSIVE METABOLIC PANEL
ALT: 15 U/L (ref 0–53)
Albumin: 3.8 g/dL (ref 3.5–5.2)
Alkaline Phosphatase: 85 U/L (ref 39–117)
CO2: 24 mEq/L (ref 19–32)
Glucose, Bld: 158 mg/dL — ABNORMAL HIGH (ref 70–99)
Potassium: 4.8 mEq/L (ref 3.5–5.3)
Sodium: 138 mEq/L (ref 135–145)
Total Bilirubin: 0.4 mg/dL (ref 0.3–1.2)
Total Protein: 6.6 g/dL (ref 6.0–8.3)

## 2012-06-20 LAB — PROTIME-INR: Protime: 27.6 Seconds — ABNORMAL HIGH (ref 10.6–13.4)

## 2012-06-20 MED ORDER — ONDANSETRON 8 MG/50ML IVPB (CHCC)
8.0000 mg | Freq: Once | INTRAVENOUS | Status: AC
  Administered 2012-06-20: 8 mg via INTRAVENOUS

## 2012-06-20 MED ORDER — SODIUM CHLORIDE 0.9 % IJ SOLN
10.0000 mL | INTRAMUSCULAR | Status: DC | PRN
  Administered 2012-06-20: 10 mL
  Filled 2012-06-20: qty 10

## 2012-06-20 MED ORDER — HEPARIN SOD (PORK) LOCK FLUSH 100 UNIT/ML IV SOLN
500.0000 [IU] | Freq: Once | INTRAVENOUS | Status: AC | PRN
  Administered 2012-06-20: 500 [IU]
  Filled 2012-06-20: qty 5

## 2012-06-20 MED ORDER — DOCETAXEL CHEMO INJECTION 160 MG/16ML
75.0000 mg/m2 | Freq: Once | INTRAVENOUS | Status: AC
  Administered 2012-06-20: 150 mg via INTRAVENOUS
  Filled 2012-06-20: qty 15

## 2012-06-20 MED ORDER — DEXAMETHASONE SODIUM PHOSPHATE 10 MG/ML IJ SOLN
10.0000 mg | Freq: Once | INTRAMUSCULAR | Status: AC
  Administered 2012-06-20: 10 mg via INTRAVENOUS

## 2012-06-20 MED ORDER — SODIUM CHLORIDE 0.9 % IV SOLN: Freq: Once | INTRAVENOUS | Status: DC

## 2012-06-20 NOTE — Progress Notes (Signed)
INR = 2.3 on 7.5 mg/day Pt in for 2nd cycle of Taxotere today. Pt will complete 6 mos of anticoagulation as of 07/17/12.  I have confirmed this w/ Dr. Arbutus Ped.  Pt has IVC filter. Pt will continue Coumadin 7.5 mg/day until his last dose on 07/17/12.  We will not need another INR at this point unless pt has sxs of VTE or bleed. I have archived pt in Dose Response. I informed pt to stop his folic acid when his current bottle is empty.  He will also no longer need B12 inj as he is not receiving Alimta. Marily Lente, Pharm.D.

## 2012-06-20 NOTE — Telephone Encounter (Signed)
gv pt appt schedule for July/August. °

## 2012-06-20 NOTE — Progress Notes (Signed)
[facility] [phone]   [phone]  OFFICE PROGRESS NOTE  Dorrene German, MD [address] [place] [address]  DIAGNOSIS: Metastatic non-small cell lung cancer, adenocarcinoma, diagnosed in June 2012.   PRIOR THERAPY:  1. Status post left Pleurx catheter placement by Dr. Edwyna Shell on February 08, 2011 for drainage of left sided pleural effusion. 2. Status post 4 cycles of induction chemotherapy with carboplatin for AUC of 6, paclitaxel 200 mg per meter square and Avastin 15 mg/kg given every 3 weeks according to the ECOG protocol 5508 with stable disease. 3. Status post 1 cycle of maintenance Avastin 15 mg/kg given on May 19, 2011, discontinued secondary to gastrointestinal bleed. 4. Maintenance chemotherapy with Alimta 500 mg/m2 every 3 weeks. Status post 9 cycles, discontinued today secondary to disease progression.   CURRENT THERAPY:  systemic chemotherapy with docetaxel 75 mg/M2 every 3 weeks with Neulasta support. Status post 1 cycle  INTERVAL HISTORY: Johnathan Bowman 78 y.o. male returns to the clinic today for followup visit accompanied by his daughter. He tolerated his first cycle of chemotherapy with docetaxel last dose support relatively well. Other than any clear runny nose he voices no specific complaints today. He denied any fever or chills.  He denied having any significant nausea or vomiting   MEDICAL HISTORY: Past Medical History  Diagnosis Date  . Hypertension   . Diabetes mellitus   . Dyslipidemia   . lung ca 11/12/2010    LUL  . Mini stroke 2005  . Degenerative disc disease   . GERD (gastroesophageal reflux disease)   . Osteopenia 2005  . Coronary artery disease 2005    treated medically  . BPH (benign prostatic hyperplasia)   . DVT (deep venous thrombosis)   . History of chemotherapy   . Diverticulosis     per scan 05/28/12  . Calculus of kidney     per scan 05/28/12    ALLERGIES:  is allergic to  penicillins.  MEDICATIONS:  Current Outpatient Prescriptions  Medication Sig Dispense Refill  . acetaminophen (TYLENOL) 500 MG tablet Take 500 mg by mouth every 6 (six) hours as needed. For pain.      Marland Kitchen amLODipine-benazepril (LOTREL) 5-10 MG per capsule Take 1 capsule by mouth daily.        Marland Kitchen aspirin 81 MG tablet Take 81 mg by mouth daily.       Marland Kitchen dexamethasone (DECADRON) 4 MG tablet Take 4 mg by mouth 2 (two) times daily with a meal. 2 tabs by mouth BID, the day before, the day of and the day after chemotherapy      . glyBURIDE-metformin (GLUCOVANCE) 5-500 MG per tablet Take 2 tablets by mouth 2 (two) times daily.        Marland Kitchen HYDROcodone-acetaminophen (VICODIN) 5-500 MG per tablet Take 1-2 tablets by mouth every 6 (six) hours as needed. For pain.      Marland Kitchen ibuprofen (ADVIL,MOTRIN) 200 MG tablet Take 200 mg by mouth every 6 (six) hours as needed.      . lidocaine-prilocaine (EMLA) cream Apply 1 application topically as needed. Apply to [facility] A Cath site before chemotherapy.       Marland Kitchen morphine (MS CONTIN) 30 MG 12 hr tablet Take 1 tablet (30 mg total) by mouth 2 (two) times daily.  60 tablet  0  . pantoprazole (PROTONIX) 40 MG tablet Take 1 tablet (40 mg total) by mouth 2 (two) times daily.  30 tablet  3  .  warfarin (COUMADIN) 5 MG tablet Take 7.5 mg by mouth daily. One and one-half tablets       No current facility-administered medications for this visit.   Facility-Administered Medications Ordered in Other Visits  Medication Dose Route Frequency Provider Last Rate Last Dose  . 0.9 %  sodium chloride infusion   Intravenous Once Conni Slipper, PA      . dexamethasone (DECADRON) injection 10 mg  10 mg Intravenous Once Conni Slipper, PA   10 mg at 06/20/12 1521  . DOCEtaxel (TAXOTERE) 150 mg in dextrose 5 % 250 mL chemo infusion  75 mg/m2 (Treatment Plan Actual) Intravenous Once Conni Slipper, PA 265 mL/hr at 06/20/12 1608 150 mg at 06/20/12 1608  . heparin lock flush 100 unit/mL  500  Units Intracatheter Once PRN Conni Slipper, PA      . ondansetron (ZOFRAN) IVPB 8 mg  8 mg Intravenous Once Conni Slipper, PA   8 mg at 06/20/12 1521  . sodium chloride 0.9 % injection 10 mL  10 mL Intracatheter PRN Conni Slipper, PA        SURGICAL HISTORY:  Past Surgical History  Procedure Date  . No past surgeries   . Pleurex cath 02/08/11    left-sided pleural effusion    REVIEW OF SYSTEMS:  A comprehensive review of systems was negative except for: Ears, nose, mouth, throat, and face: positive for Clear runny nose not associated with fever chills   PHYSICAL EXAMINATION: General appearance: alert, cooperative, fatigued and no distress Head: Normocephalic, without obvious abnormality, atraumatic Neck: no adenopathy Lymph nodes: Cervical, supraclavicular, and axillary nodes normal. Resp: diminished breath sounds RLL Cardio: regular rate and rhythm, S1, S2 normal, no murmur, click, rub or gallop GI: soft, non-tender; bowel sounds normal; no masses,  no organomegaly Extremities: extremities normal, atraumatic, no cyanosis or edema Neurologic: Alert and oriented X 3, normal strength and tone. Normal symmetric reflexes. Normal coordination and gait  ECOG PERFORMANCE STATUS: 1 - Symptomatic but completely ambulatory  Blood pressure 140/80, pulse 111, temperature 97.9 F (36.6 C), temperature source Oral, height 5\' 5"  (1.651 m), weight 188 lb 8 oz (85.503 kg).  LABORATORY DATA: Lab Results  Component Value Date   WBC 6.6 06/20/2012   HGB 11.4* 06/20/2012   HCT 37.1* 06/20/2012   MCV 93.0 06/20/2012   PLT 352 06/20/2012      Chemistry      Component Value Date/Time   NA 138 06/13/2012 0959   NA 139 11/07/2011 1621   K 4.5 06/13/2012 0959   K 4.8* 11/07/2011 1621   CL 102 06/13/2012 0959   CL 97* 11/07/2011 1621   CO2 24 06/13/2012 0959   CO2 30 11/07/2011 1621   BUN 9 06/13/2012 0959   BUN 11 11/07/2011 1621   CREATININE 1.05 06/13/2012 0959   CREATININE 0.9 11/07/2011 1621        Component Value Date/Time   CALCIUM 8.2* 06/13/2012 0959   CALCIUM 9.4 11/07/2011 1621   ALKPHOS 99 06/13/2012 0959   ALKPHOS 100* 11/07/2011 1621   AST 12 06/13/2012 0959   AST 21 11/07/2011 1621   ALT 10 06/13/2012 0959   BILITOT 0.3 06/13/2012 0959   BILITOT 0.50 11/07/2011 1621       RADIOGRAPHIC STUDIES: Ct Chest W Contrast  05/28/2012  *RADIOLOGY REPORT*  Clinical Data:  History of lung cancer diagnosed in January 2012 status post chemotherapy.  Shortness of breath.  Posterior chest tightness.  CT CHEST,  ABDOMEN AND PELVIS WITH CONTRAST  Technique:  Multidetector CT imaging of the chest, abdomen and pelvis was performed following the standard protocol during bolus administration of intravenous contrast.  Contrast: OMNIPAQUE IOHEXOL 300 MG/ML  SOLN  Comparison:  CT chest 03/23/2012.  CT of the chest abdomen and pelvis 11/07/2011.   CT CHEST  Findings:  Mediastinum: There has been interval enlargement of an AP window lymph node which currently measures 19 mm (previously 17 mm) short axis.  Borderline enlarged anterior mediastinal lymph node has slightly increased and currently measures 1 cm in short axis. Heart size is upper limits of normal. There is no significant pericardial fluid, thickening or pericardial calcification.  Esophagus is unremarkable in appearance. There is atherosclerosis of the thoracic aorta, the great vessels of the mediastinum and the coronary arteries, including calcified atherosclerotic plaque in the distal left main, left anterior descending, left circumflex and right coronary arteries. There is a right-sided internal jugular single lumen Port-A-Cath with tip terminating in the distal superior vena cava.  Lungs/Pleura: Left upper lobe paramediastinal mass (image 25 of series 5) appears slightly larger than the prior examination, currently measuring 1.8 x 3.3 cm.  Pleural based nodularity throughout the left hemithorax appears slightly increased compared to the  prior study, and is clearly increased compared to more remote prior examinations.  Specific examples included a pleural based nodule in the periphery of the left upper lobe (image 17 of series 5) that has increased in thickness to 10 mm on today's examination (previously 8 mm). Another example as a pleural-based nodule in the periphery of the left upper lobe (image 20 of series 5) which currently measures 1 cm (previously 5 mm).  The chronic left-sided pleural effusion has slightly increased in size and demonstrates increasing pleural thickening and nodular enhancement when compared to the prior examinations. Some chronic pleural based calcifications are also noted the base of the left hemithorax (unchanged).  Unchanged 4 mm nodule in the superior segment of the right lower lobe (image 21 of series 5).  Mild paraseptal emphysema in the apices of the lungs bilaterally.  Area of interstitial prominence in the dependent portion of the right lower lobe which is increased compared to prior examinations, but has an appearance most suggestive of scarring (potentially from recurrent episodes of aspiration).  Musculoskeletal: There are no aggressive appearing lytic or blastic lesions noted in the visualized portions of the skeleton.  IMPRESSION:  1.  Findings, as above, consistent with progression of disease, including enlargement of the primary left upper lobe mass, increasing pleural-based metastases in the left hemithorax, and enlarging left AP window and anterior mediastinal adenopathy. 2. Atherosclerosis, including left main and three-vessel coronary artery disease. Please note that although the presence of coronary artery calcium documents the presence of coronary artery disease, the severity of this disease and any potential stenosis cannot be assessed on this non-gated CT examination.  Assessment for potential risk factor modification, dietary therapy or pharmacologic therapy may be warranted, if clinically  indicated. 3.  Additional incidental findings, as above.   CT ABDOMEN AND PELVIS  Findings:  Abdomen/Pelvis: In segment 2 of the liver there are two small low attenuation lesions which are similar to prior studies, and compatible with cysts, the largest of which measures 2.3 x 1.6 cm. No other definite aggressive appearing hepatic lesions are noted on today's examination.  The enhanced appearance of the gallbladder, pancreas, spleen and bilateral adrenal glands is unremarkable.  In the right renal collecting system and there  is a large calculus which measures 17 mm. Multiple low attenuation renal lesions are compatible with cysts, the largest of which is in the right kidney measuring 4.6 cm.  Several renal lesions are too small to definitively characterize, but are similar to prior examinations. An IVC filter is in place with tip terminating just below the level of the renal veins bilaterally.  There is atherosclerosis of the abdominal and pelvic vasculature, without evidence of aneurysm or dissection at this time.  There is no ascites or pneumoperitoneum and no pathologic distension of bowel.  No definite pathologic lymphadenopathy identified within the abdomen or pelvis.  There is extensive colonic diverticulosis, most pronounced in the region of the sigmoid colon, without surrounding inflammatory changes to suggest an acute diverticulitis at this time.  Normal appendix.  Prostate and urinary bladder are unremarkable in appearance.  Musculoskeletal: There are no aggressive appearing lytic or blastic lesions noted in the visualized portions of the skeleton.  IMPRESSION:  1.  No findings to suggest metastatic disease to the abdomen or pelvis on today's examination. 2.  Multiple renal lesions are again noted bilaterally, all of which appears similar to the prior examination.  Several of these are too small to definitively characterize, with the larger lesions all compatible with simple cysts. 3.  In addition, there  is a 17 mm nonobstructive calculus in the interpolar collecting system of the right kidney. 4.  Extensive colonic diverticulosis without evidence to suggest acute diverticulitis. 5.  Hepatic cysts in segment 2 of the liver are unchanged compared to prior examinations. 6.  Additional incidental findings, as above.  Original Report Authenticated By: Florencia Reasons, M.D.    ASSESSMENT/PLAN: This is a very pleasant 78 years old African American male with metastatic non-small cell lung cancer recently treated with 3 cycles of chemotherapy with docetaxel but unfortunately has evidence for disease progression involving the left pulmonary mass as well as the pleural-based masses. The patient was discussed with Dr. Arbutus Ped. He will continue with weekly labs consisting of a CBC differential and C. met. He'll proceed with cycle #2 of his systemic chemotherapy with docetaxel at 75 mg per meter square given every 3 weeks with Neulasta support. He'll return in 3 weeks prior to cycle #3 with repeat CBC differential and C. met. He'll continue to followup with the [facility] regarding his anticoagulation therapy related to his left lower extremity deep vein thrombosis.  Laural Benes, Sharell Hilmer E, PA-C   All questions were answered. The patient knows to call the clinic with any problems, questions or concerns. We can certainly see the patient much sooner if necessary.  I spent 20 minutes counseling the patient face to face. The total time spent in the appointment was 30 minutes.

## 2012-06-20 NOTE — Patient Instructions (Signed)
[facility] Discharge Instructions for Patients Receiving Chemotherapy  Today you received the following chemotherapy agents Taxotere To help prevent nausea and vomiting after your treatment, we encourage you to take your nausea medication as prescribed.  If you develop nausea and vomiting that is not controlled by your nausea medication, call the clinic. If it is after clinic hours your family physician or the after hours number for the clinic or go to the Emergency Department.   BELOW ARE SYMPTOMS THAT SHOULD BE REPORTED IMMEDIATELY:  *FEVER GREATER THAN 100.5 F  *CHILLS WITH OR WITHOUT FEVER  NAUSEA AND VOMITING THAT IS NOT CONTROLLED WITH YOUR NAUSEA MEDICATION  *UNUSUAL SHORTNESS OF BREATH  *UNUSUAL BRUISING OR BLEEDING  TENDERNESS IN MOUTH AND THROAT WITH OR WITHOUT PRESENCE OF ULCERS  *URINARY PROBLEMS  *BOWEL PROBLEMS  UNUSUAL RASH Items with * indicate a potential emergency and should be followed up as soon as possible.  One of the nurses will contact you 24 hours after your treatment. Please let the nurse know about any problems that you may have experienced. Feel free to call the clinic you have any questions or concerns. The clinic phone number is [phone].   I have been informed and understand all the instructions given to me. I know to contact the clinic, my physician, or go to the Emergency Department if any problems should occur. I do not have any questions at this time, but understand that I may call the clinic during office hours   should I have any questions or need assistance in obtaining follow up care.    __________________________________________  _____________  __________ Signature of Patient or Authorized Representative            Date                   Time    __________________________________________ Nurse's Signature    

## 2012-06-22 ENCOUNTER — Ambulatory Visit
Admission: RE | Admit: 2012-06-22 | Discharge: 2012-06-22 | Disposition: A | Discharge status: Home / Self Care | Payer: Medicare Other | Source: Ambulatory Visit | Attending: Radiation Oncology | Admitting: Radiation Oncology

## 2012-06-22 ENCOUNTER — Encounter: Payer: Self-pay | Admitting: *Deleted

## 2012-06-22 ENCOUNTER — Ambulatory Visit (HOSPITAL_BASED_OUTPATIENT_CLINIC_OR_DEPARTMENT_OTHER): Payer: Medicare Other

## 2012-06-22 VITALS — BP 143/76 | HR 117 | Temp 98.1°F

## 2012-06-22 DIAGNOSIS — C3490 Malignant neoplasm of unspecified part of unspecified bronchus or lung: Secondary | ICD-10-CM

## 2012-06-22 DIAGNOSIS — C3410 Malignant neoplasm of upper lobe, unspecified bronchus or lung: Secondary | ICD-10-CM

## 2012-06-22 DIAGNOSIS — C801 Malignant (primary) neoplasm, unspecified: Secondary | ICD-10-CM

## 2012-06-22 MED ORDER — PEGFILGRASTIM INJECTION 6 MG/0.6ML
6.0000 mg | Freq: Once | SUBCUTANEOUS | Status: AC
  Administered 2012-06-22: 6 mg via SUBCUTANEOUS
  Filled 2012-06-22: qty 0.6

## 2012-06-22 NOTE — Progress Notes (Signed)
[facility] Psychosocial Distress Screening Clinical Social Work  Clinical Social Work was referred by distress screening protocol.  The patient scored a 10 on the Psychosocial Distress Thermometer which indicates severe distress. The patient indicated his stress level is low and feels "normal anxiety about health", however he is in a lot of pain. Clinical Social Worker contacted patient to assess for distress and other psychosocial needs. Mr. Wenzler states he feels he is tolerating treatment well and his pain has been managed. CSW explained support services role and encouraged patient to contact CSW with any additional questions or concerns.    Clinical Social Worker follow up needed: no  If yes, follow up plan:  Kathrin Penner, MSW, [facility] Clinical Social Worker [facility] [phone]

## 2012-06-24 ENCOUNTER — Encounter: Payer: Self-pay | Admitting: Radiation Oncology

## 2012-06-24 NOTE — Progress Notes (Signed)
Simulation verification note  The patient underwent simulation verification for treatment to his chest on 06/22/2012.Marland Kitchen His isocenter is in good position and the multileaf collimators contoured the treatment volume appropriately.

## 2012-06-25 ENCOUNTER — Encounter: Payer: Self-pay | Admitting: Radiation Oncology

## 2012-06-25 ENCOUNTER — Ambulatory Visit
Admission: RE | Admit: 2012-06-25 | Discharge: 2012-06-25 | Disposition: A | Discharge status: Home / Self Care | Payer: Medicare Other | Source: Ambulatory Visit | Attending: Radiation Oncology | Admitting: Radiation Oncology

## 2012-06-25 VITALS — BP 144/82 | HR 117 | Temp 98.5°F | Resp 20 | Wt 185.0 lb

## 2012-06-25 DIAGNOSIS — C801 Malignant (primary) neoplasm, unspecified: Secondary | ICD-10-CM

## 2012-06-25 NOTE — Progress Notes (Signed)
Weekly Management Note:  Site:L Chest Current Dose:  1500  cGy Projected Dose: 3500  cGy  Narrative: The patient is seen today for routine under treatment assessment. CBCT/MVCT images/port films were reviewed. The chart was reviewed.   He was somewhat unsteady on his feet this morning. He does have a history of "balance problems" most of his adult years. He does not feel that his balance has change. His last CT of the brain was back in October of 2012. 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 He states that his left posterior chest wall pain is improved. He is not currently taking morphine. He denies headaches, nausea/vomiting, or change in vision. Physical Examination:  Filed Vitals:   06/25/12 0943  BP: 144/82  Pulse: 117  Temp: 98.5 F (36.9 C)  Resp: 20  .  Weight: 185 lb (83.915 kg). No change.   Impression: Tolerating radiation therapy well. I believe that his balance difficulties are unchanged and may be related to his small vessel disease. We have to obviously be concerned about the possibility of metastatic disease to brain.  Plan: Continue radiation therapy as planned. If his symptoms worsen and we could obtain a CT or MRI of his brain. He'll finish his radiation therapy next week.

## 2012-06-25 NOTE — Progress Notes (Signed)
Patient assisted to nursing dressing room 6, he qwas unsteady and staggering, took ortho vitals siting b/p=120/76,p=108, standing b/p=144/82,p=117, T=98.5, room air sats=91%, rr=20, patient stated back pain a 4 pain level , hasn't hie morphine tab this am states"i only take it if sever pain, even though it is scheduled bid, patient lightheaded this am doesn't undertand why he was staggering, coughing up white sputum,  9:48 AM

## 2012-06-26 ENCOUNTER — Ambulatory Visit
Admission: RE | Admit: 2012-06-26 | Discharge: 2012-06-26 | Disposition: A | Discharge status: Home / Self Care | Payer: Medicare Other | Source: Ambulatory Visit | Attending: Radiation Oncology | Admitting: Radiation Oncology

## 2012-06-27 ENCOUNTER — Ambulatory Visit
Admission: RE | Admit: 2012-06-27 | Discharge: 2012-06-27 | Disposition: A | Discharge status: Home / Self Care | Payer: Medicare Other | Source: Ambulatory Visit | Attending: Radiation Oncology | Admitting: Radiation Oncology

## 2012-06-27 ENCOUNTER — Other Ambulatory Visit (HOSPITAL_BASED_OUTPATIENT_CLINIC_OR_DEPARTMENT_OTHER): Payer: Medicare Other

## 2012-06-27 DIAGNOSIS — C3490 Malignant neoplasm of unspecified part of unspecified bronchus or lung: Secondary | ICD-10-CM

## 2012-06-27 LAB — CBC WITH DIFFERENTIAL/PLATELET
Basophils Absolute: 0 10*3/uL (ref 0.0–0.1)
EOS%: 0.7 % (ref 0.0–7.0)
HCT: 35.5 % — ABNORMAL LOW (ref 38.4–49.9)
HGB: 11 g/dL — ABNORMAL LOW (ref 13.0–17.1)
MCH: 28.8 pg (ref 27.2–33.4)
MONO#: 1.2 10*3/uL — ABNORMAL HIGH (ref 0.1–0.9)
NEUT%: 75.6 % — ABNORMAL HIGH (ref 39.0–75.0)
lymph#: 0.9 10*3/uL (ref 0.9–3.3)

## 2012-06-27 LAB — COMPREHENSIVE METABOLIC PANEL
BUN: 14 mg/dL (ref 6–23)
CO2: 27 mEq/L (ref 19–32)
Calcium: 8.5 mg/dL (ref 8.4–10.5)
Chloride: 103 mEq/L (ref 96–112)
Creatinine, Ser: 1.33 mg/dL (ref 0.50–1.35)
Glucose, Bld: 146 mg/dL — ABNORMAL HIGH (ref 70–99)

## 2012-06-28 ENCOUNTER — Ambulatory Visit
Admission: RE | Admit: 2012-06-28 | Discharge: 2012-06-28 | Disposition: A | Discharge status: Home / Self Care | Payer: Medicare Other | Source: Ambulatory Visit | Attending: Radiation Oncology | Admitting: Radiation Oncology

## 2012-06-29 ENCOUNTER — Ambulatory Visit
Admission: RE | Admit: 2012-06-29 | Discharge: 2012-06-29 | Disposition: A | Discharge status: Home / Self Care | Payer: Medicare Other | Source: Ambulatory Visit | Attending: Radiation Oncology | Admitting: Radiation Oncology

## 2012-07-02 ENCOUNTER — Ambulatory Visit
Admission: RE | Admit: 2012-07-02 | Discharge: 2012-07-02 | Disposition: A | Discharge status: Home / Self Care | Payer: Medicare Other | Source: Ambulatory Visit | Attending: Radiation Oncology | Admitting: Radiation Oncology

## 2012-07-02 ENCOUNTER — Encounter: Payer: Self-pay | Admitting: Radiation Oncology

## 2012-07-02 VITALS — BP 126/77 | HR 118 | Temp 97.1°F | Resp 18 | Wt 188.8 lb

## 2012-07-02 DIAGNOSIS — C3490 Malignant neoplasm of unspecified part of unspecified bronchus or lung: Secondary | ICD-10-CM

## 2012-07-02 MED ORDER — HYDROCODONE-ACETAMINOPHEN 5-500 MG PO TABS: 1.0000 | ORAL_TABLET | Freq: Four times a day (QID) | ORAL | Status: DC | PRN

## 2012-07-02 NOTE — Progress Notes (Signed)
   Weekly Management Note; left chest Current Dose: 27.5  Gy  Projected Dose: 35 Gy   Narrative:  The patient presents for routine under treatment assessment.  CBCT/MVCT images/Port film x-rays were reviewed.  The chart was checked. taking MS Contin for long-acting pain management as he is out of Vicodin. Having significant left-sided pain. It keeps him awake at night. He is continuing to have some problems with balance. This is somewhat complicated by a chronic issue with imbalance. He acknowledges some frontal headaches. His daughter acknowledges that he doesn't seem as steady on his feet as usual.  Physical Findings:  weight is 188 lb 12.8 oz (85.639 kg). His oral temperature is 97.1 F (36.2 C). His blood pressure is 126/77 and his pulse is 118. His respiration is 18 and oxygen saturation is 91%. sitting in a chair in no acute distress, nontoxic appearing.  Impression:  The patient is tolerating radiotherapy.  Plan:  Continue radiotherapy as planned. will go ahead and order an MRI of his brain as it appears he's not yet undergone any staging of his brain recently. Patient and family are agreeable to this plan.  ________________________________   Lonie Peak, M.D.

## 2012-07-02 NOTE — Progress Notes (Signed)
Patient presents to the clinic today accompanied by two family member for a PUT visit with Dr. Basilio Cairo. Patient is alert and oriented to person, place, and time. No distress noted. Steady gait noted. Pleasant affect noted. Patient denies pain at this time. However, patient reports left side pain is keeping him awake at night despite MS Contin 30 mg and Vicodin 5/500. Patient complains of dyspnea mostly with exertion. Patient reports a productive cough with whitish yellow sputum. Patient reports occasional dizziness. Patient denies nausea and vomiting. Patient reports a good appetite and weight has remained stable. Reported all findings to Dr. Basilio Cairo.

## 2012-07-03 ENCOUNTER — Ambulatory Visit
Admission: RE | Admit: 2012-07-03 | Discharge: 2012-07-03 | Disposition: A | Discharge status: Home / Self Care | Payer: Medicare Other | Source: Ambulatory Visit | Attending: Radiation Oncology | Admitting: Radiation Oncology

## 2012-07-03 ENCOUNTER — Telehealth: Payer: Self-pay | Admitting: *Deleted

## 2012-07-03 NOTE — Telephone Encounter (Signed)
CALLED PATIENT TO INFORM OF TEST FOR 07-07-12, SPOKE WITH PATIENT'S DAUGHTER AND THEY ARE AWARE OF THIS TEST.

## 2012-07-04 ENCOUNTER — Other Ambulatory Visit (HOSPITAL_BASED_OUTPATIENT_CLINIC_OR_DEPARTMENT_OTHER): Payer: Medicare Other | Admitting: Lab

## 2012-07-04 ENCOUNTER — Ambulatory Visit
Admission: RE | Admit: 2012-07-04 | Discharge: 2012-07-04 | Disposition: A | Discharge status: Home / Self Care | Payer: Medicare Other | Source: Ambulatory Visit | Attending: Radiation Oncology | Admitting: Radiation Oncology

## 2012-07-04 DIAGNOSIS — C3490 Malignant neoplasm of unspecified part of unspecified bronchus or lung: Secondary | ICD-10-CM

## 2012-07-04 LAB — CBC WITH DIFFERENTIAL/PLATELET
BASO%: 0.3 % (ref 0.0–2.0)
EOS%: 0.3 % (ref 0.0–7.0)
HCT: 36.5 % — ABNORMAL LOW (ref 38.4–49.9)
LYMPH%: 5.7 % — ABNORMAL LOW (ref 14.0–49.0)
MCH: 29.1 pg (ref 27.2–33.4)
MCHC: 31.3 g/dL — ABNORMAL LOW (ref 32.0–36.0)
MCV: 92.9 fL (ref 79.3–98.0)
MONO%: 6.9 % (ref 0.0–14.0)
NEUT%: 86.8 % — ABNORMAL HIGH (ref 39.0–75.0)
Platelets: 157 10*3/uL (ref 140–400)

## 2012-07-04 LAB — COMPREHENSIVE METABOLIC PANEL WITH GFR
ALT: 12 U/L (ref 0–53)
AST: 16 U/L (ref 0–37)
Albumin: 3.4 g/dL — ABNORMAL LOW (ref 3.5–5.2)
Alkaline Phosphatase: 100 U/L (ref 39–117)
BUN: 7 mg/dL (ref 6–23)
CO2: 28 meq/L (ref 19–32)
Calcium: 8.6 mg/dL (ref 8.4–10.5)
Chloride: 105 meq/L (ref 96–112)
Creatinine, Ser: 1.05 mg/dL (ref 0.50–1.35)
Glucose, Bld: 136 mg/dL — ABNORMAL HIGH (ref 70–99)
Potassium: 4.2 meq/L (ref 3.5–5.3)
Sodium: 140 meq/L (ref 135–145)
Total Bilirubin: 0.3 mg/dL (ref 0.3–1.2)
Total Protein: 6.1 g/dL (ref 6.0–8.3)

## 2012-07-05 ENCOUNTER — Ambulatory Visit
Admission: RE | Admit: 2012-07-05 | Discharge: 2012-07-05 | Disposition: A | Discharge status: Home / Self Care | Payer: Medicare Other | Source: Ambulatory Visit | Attending: Radiation Oncology | Admitting: Radiation Oncology

## 2012-07-05 VITALS — BP 128/77 | HR 108 | Temp 98.3°F | Resp 20 | Wt 188.0 lb

## 2012-07-05 DIAGNOSIS — C3490 Malignant neoplasm of unspecified part of unspecified bronchus or lung: Secondary | ICD-10-CM

## 2012-07-05 NOTE — Progress Notes (Signed)
Pt completed radiation to left chest today, has FU appt. Pt denies pain, does have prod cough w/white -yellow sputum, SOB w/minimal activity. Pt has O2 at home but states he hasn't used "in a long time".  Pt scheduled for MRI brain 07/07/12 per Dr Basilio Cairo. States he has had "dizzy spells,  forehead hurts", no nausea or vision changes.

## 2012-07-05 NOTE — Progress Notes (Signed)
   Department of Radiation Oncology  Phone:  [phone] Fax:        [phone]  Weekly Treatment Note    Name: Johnathan Bowman Date: 07/05/2012 MRN: [id number] DOB: [birth year]   Current dose: 35 Gy  Current fraction: 14   MEDICATIONS: Current Outpatient Prescriptions  Medication Sig Dispense Refill  . acetaminophen (TYLENOL) 500 MG tablet Take 500 mg by mouth every 6 (six) hours as needed. For pain.      Marland Kitchen amLODipine-benazepril (LOTREL) 5-10 MG per capsule Take 1 capsule by mouth daily.        Marland Kitchen aspirin 81 MG tablet Take 81 mg by mouth daily.       Marland Kitchen dexamethasone (DECADRON) 4 MG tablet Take 4 mg by mouth 2 (two) times daily with a meal. 2 tabs by mouth BID, the day before, the day of and the day after chemotherapy      . glyBURIDE-metformin (GLUCOVANCE) 5-500 MG per tablet Take 2 tablets by mouth 2 (two) times daily.        Marland Kitchen HYDROcodone-acetaminophen (VICODIN) 5-500 MG per tablet Take 1-2 tablets by mouth every 6 (six) hours as needed. For pain.  60 tablet  0  . ibuprofen (ADVIL,MOTRIN) 200 MG tablet Take 200 mg by mouth every 6 (six) hours as needed.      . lidocaine-prilocaine (EMLA) cream Apply 1 application topically as needed. Apply to [facility] Cath site before chemotherapy.       Marland Kitchen morphine (MS CONTIN) 30 MG 12 hr tablet Take 1 tablet (30 mg total) by mouth 2 (two) times daily.  60 tablet  0  . pantoprazole (PROTONIX) 40 MG tablet Take 1 tablet (40 mg total) by mouth 2 (two) times daily.  30 tablet  3  . warfarin (COUMADIN) 5 MG tablet Take 7.5 mg by mouth daily. One and one-half tablets         ALLERGIES: Penicillins   LABORATORY DATA:  Lab Results  Component Value Date   WBC 12.8* 07/04/2012   HGB 11.4* 07/04/2012   HCT 36.5* 07/04/2012   MCV 92.9 07/04/2012   PLT 157 07/04/2012   Lab Results  Component Value Date   NA 140 07/04/2012   K 4.2 07/04/2012   CL 105 07/04/2012   CO2 28 07/04/2012   Lab Results  Component Value Date   ALT 12 07/04/2012   AST  16 07/04/2012   ALKPHOS 100 07/04/2012   BILITOT 0.3 07/04/2012     NARRATIVE: Johnathan Bowman was seen today for weekly treatment management. The chart was checked and the patient's films were reviewed. The patient finished his final fraction today. He denies any new changes. Some ongoing shortness of breath but no worsening of this. He does have an occasional productive cough. The patient has also been having some dizzy spells and he is scheduled for an MRI scan of the brain on 07/07/2012.  PHYSICAL EXAMINATION: weight is 188 lb (85.276 kg). His oral temperature is 98.3 F (36.8 C). His blood pressure is 128/77 and his pulse is 108. His respiration is 20 and oxygen saturation is 91%.        ASSESSMENT: The patient did satisfactorily with treatment.  PLAN: Upcoming brain MRI scan as noted above. The patient is also scheduled for followup.

## 2012-07-07 ENCOUNTER — Encounter: Payer: Self-pay | Admitting: Radiation Oncology

## 2012-07-07 ENCOUNTER — Ambulatory Visit (HOSPITAL_COMMUNITY)
Admission: RE | Admit: 2012-07-07 | Discharge: 2012-07-07 | Disposition: A | Discharge status: Home / Self Care | Payer: Medicare Other | Source: Ambulatory Visit | Attending: Radiation Oncology | Admitting: Radiation Oncology

## 2012-07-07 DIAGNOSIS — I6789 Other cerebrovascular disease: Secondary | ICD-10-CM | POA: Insufficient documentation

## 2012-07-07 DIAGNOSIS — C3490 Malignant neoplasm of unspecified part of unspecified bronchus or lung: Secondary | ICD-10-CM | POA: Insufficient documentation

## 2012-07-07 MED ORDER — GADOBENATE DIMEGLUMINE 529 MG/ML IV SOLN
11.0000 mL | Freq: Once | INTRAVENOUS | Status: AC | PRN
  Administered 2012-07-07: 17 mL via INTRAVENOUS

## 2012-07-07 NOTE — Progress Notes (Signed)
[facility] Radiation Oncology End of Treatment Note  Name:Johnathan Bowman  Date: 07/07/2012 ZOX:[id number] DOB:Aug 09, 1929   Status:outpatient    CC: Dorrene German, MD  , Dr. Si Gaul, Dr. Margaretmary Dys  REFERRING PHYSICIAN: Dr. 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 A MRI scan of the brain was obtained a [facility] today which showed 2 small areas of new enhancement in the left superior frontal region and also right superior cerebellum suspicious for metastatic disease, however considering the patient's known chronic ischemia a short interval followup MRI was recommended with without contrast and 4-6 weeks.   I should mention that he required Vicodin and also MS Contin during his course of therapy.                      PLAN: Routine followup in one month. Patient instructed to call if questions or worsening complaints in interim. I am forwarding his MRI scan report to our [facility] team to see if we should proceed with a 3T MRI of the brain at this time.

## 2012-07-09 ENCOUNTER — Telehealth: Payer: Self-pay | Admitting: Medical Oncology

## 2012-07-09 NOTE — Telephone Encounter (Signed)
Reports " shingles have appeared under left arm with several bumps and itching. Requests shingles medicine. Appointment with [facility] 07/11/12. Per Dr Donnald Garre pt needs to see Adrena first or call primary care. He will wait to see Adrena

## 2012-07-10 ENCOUNTER — Other Ambulatory Visit: Payer: Self-pay | Admitting: Radiation Therapy

## 2012-07-10 DIAGNOSIS — C3490 Malignant neoplasm of unspecified part of unspecified bronchus or lung: Secondary | ICD-10-CM

## 2012-07-10 DIAGNOSIS — C7931 Secondary malignant neoplasm of brain: Secondary | ICD-10-CM

## 2012-07-11 ENCOUNTER — Other Ambulatory Visit: Payer: Medicare Other | Admitting: Lab

## 2012-07-11 ENCOUNTER — Ambulatory Visit (HOSPITAL_BASED_OUTPATIENT_CLINIC_OR_DEPARTMENT_OTHER): Payer: Medicare Other

## 2012-07-11 ENCOUNTER — Other Ambulatory Visit (HOSPITAL_BASED_OUTPATIENT_CLINIC_OR_DEPARTMENT_OTHER): Payer: Medicare Other

## 2012-07-11 ENCOUNTER — Telehealth: Payer: Self-pay | Admitting: *Deleted

## 2012-07-11 ENCOUNTER — Ambulatory Visit (HOSPITAL_BASED_OUTPATIENT_CLINIC_OR_DEPARTMENT_OTHER): Payer: Medicare Other | Admitting: Physician Assistant

## 2012-07-11 VITALS — BP 144/83 | HR 99 | Temp 96.9°F | Ht 65.0 in | Wt 189.2 lb

## 2012-07-11 DIAGNOSIS — C801 Malignant (primary) neoplasm, unspecified: Secondary | ICD-10-CM

## 2012-07-11 DIAGNOSIS — C3410 Malignant neoplasm of upper lobe, unspecified bronchus or lung: Secondary | ICD-10-CM

## 2012-07-11 DIAGNOSIS — R21 Rash and other nonspecific skin eruption: Secondary | ICD-10-CM

## 2012-07-11 DIAGNOSIS — L299 Pruritus, unspecified: Secondary | ICD-10-CM

## 2012-07-11 DIAGNOSIS — C3490 Malignant neoplasm of unspecified part of unspecified bronchus or lung: Secondary | ICD-10-CM

## 2012-07-11 DIAGNOSIS — C782 Secondary malignant neoplasm of pleura: Secondary | ICD-10-CM

## 2012-07-11 DIAGNOSIS — I82409 Acute embolism and thrombosis of unspecified deep veins of unspecified lower extremity: Secondary | ICD-10-CM

## 2012-07-11 DIAGNOSIS — Z5111 Encounter for antineoplastic chemotherapy: Secondary | ICD-10-CM

## 2012-07-11 LAB — COMPREHENSIVE METABOLIC PANEL
ALT: 11 U/L (ref 0–53)
BUN: 11 mg/dL (ref 6–23)
CO2: 24 mEq/L (ref 19–32)
Calcium: 9 mg/dL (ref 8.4–10.5)
Creatinine, Ser: 0.76 mg/dL (ref 0.50–1.35)
Total Bilirubin: 0.2 mg/dL — ABNORMAL LOW (ref 0.3–1.2)

## 2012-07-11 LAB — CBC WITH DIFFERENTIAL/PLATELET
BASO%: 0.1 % (ref 0.0–2.0)
Basophils Absolute: 0 10*3/uL (ref 0.0–0.1)
HCT: 35.4 % — ABNORMAL LOW (ref 38.4–49.9)
HGB: 11 g/dL — ABNORMAL LOW (ref 13.0–17.1)
LYMPH%: 4.3 % — ABNORMAL LOW (ref 14.0–49.0)
MCH: 28.8 pg (ref 27.2–33.4)
MCHC: 31.1 g/dL — ABNORMAL LOW (ref 32.0–36.0)
MONO#: 1.2 10*3/uL — ABNORMAL HIGH (ref 0.1–0.9)
NEUT%: 83.4 % — ABNORMAL HIGH (ref 39.0–75.0)
Platelets: 238 10*3/uL (ref 140–400)

## 2012-07-11 MED ORDER — HEPARIN SOD (PORK) LOCK FLUSH 100 UNIT/ML IV SOLN
500.0000 [IU] | Freq: Once | INTRAVENOUS | Status: AC | PRN
  Administered 2012-07-11: 500 [IU]
  Filled 2012-07-11: qty 5

## 2012-07-11 MED ORDER — SODIUM CHLORIDE 0.9 % IV SOLN
Freq: Once | INTRAVENOUS | Status: AC
  Administered 2012-07-11: 13:00:00 via INTRAVENOUS

## 2012-07-11 MED ORDER — HYDROXYZINE HCL 25 MG PO TABS: ORAL_TABLET | ORAL | Status: DC

## 2012-07-11 MED ORDER — SODIUM CHLORIDE 0.9 % IJ SOLN
10.0000 mL | INTRAMUSCULAR | Status: DC | PRN
  Administered 2012-07-11: 10 mL
  Filled 2012-07-11: qty 10

## 2012-07-11 MED ORDER — ONDANSETRON 8 MG/50ML IVPB (CHCC)
8.0000 mg | Freq: Once | INTRAVENOUS | Status: AC
  Administered 2012-07-11: 8 mg via INTRAVENOUS

## 2012-07-11 MED ORDER — DOCETAXEL CHEMO INJECTION 160 MG/16ML
75.0000 mg/m2 | Freq: Once | INTRAVENOUS | Status: AC
  Administered 2012-07-11: 150 mg via INTRAVENOUS
  Filled 2012-07-11: qty 15

## 2012-07-11 MED ORDER — DEXAMETHASONE SODIUM PHOSPHATE 10 MG/ML IJ SOLN
10.0000 mg | Freq: Once | INTRAMUSCULAR | Status: AC
  Administered 2012-07-11: 10 mg via INTRAVENOUS

## 2012-07-11 NOTE — Telephone Encounter (Signed)
Gave patient appointment for 08-01-2012 all I had to do is add on the md appointment

## 2012-07-11 NOTE — Patient Instructions (Signed)
[facility] Discharge Instructions for Patients Receiving Chemotherapy  Today you received the following chemotherapy agents Taxotere To help prevent nausea and vomiting after your treatment, we encourage you to take your nausea medication as prescribed.  If you develop nausea and vomiting that is not controlled by your nausea medication, call the clinic. If it is after clinic hours your family physician or the after hours number for the clinic or go to the Emergency Department.   BELOW ARE SYMPTOMS THAT SHOULD BE REPORTED IMMEDIATELY:  *FEVER GREATER THAN 100.5 F  *CHILLS WITH OR WITHOUT FEVER  NAUSEA AND VOMITING THAT IS NOT CONTROLLED WITH YOUR NAUSEA MEDICATION  *UNUSUAL SHORTNESS OF BREATH  *UNUSUAL BRUISING OR BLEEDING  TENDERNESS IN MOUTH AND THROAT WITH OR WITHOUT PRESENCE OF ULCERS  *URINARY PROBLEMS  *BOWEL PROBLEMS  UNUSUAL RASH Items with * indicate a potential emergency and should be followed up as soon as possible.  One of the nurses will contact you 24 hours after your treatment. Please let the nurse know about any problems that you may have experienced. Feel free to call the clinic you have any questions or concerns. The clinic phone number is [phone].   I have been informed and understand all the instructions given to me. I know to contact the clinic, my physician, or go to the Emergency Department if any problems should occur. I do not have any questions at this time, but understand that I may call the clinic during office hours   should I have any questions or need assistance in obtaining follow up care.    __________________________________________  _____________  __________ Signature of Patient or Authorized Representative            Date                   Time    __________________________________________ Nurse's Signature    

## 2012-07-12 ENCOUNTER — Ambulatory Visit (HOSPITAL_BASED_OUTPATIENT_CLINIC_OR_DEPARTMENT_OTHER): Payer: Medicare Other

## 2012-07-12 VITALS — BP 143/75 | HR 111 | Temp 97.6°F

## 2012-07-12 DIAGNOSIS — C801 Malignant (primary) neoplasm, unspecified: Secondary | ICD-10-CM

## 2012-07-12 DIAGNOSIS — C3410 Malignant neoplasm of upper lobe, unspecified bronchus or lung: Secondary | ICD-10-CM

## 2012-07-12 DIAGNOSIS — C3490 Malignant neoplasm of unspecified part of unspecified bronchus or lung: Secondary | ICD-10-CM

## 2012-07-12 MED ORDER — PEGFILGRASTIM INJECTION 6 MG/0.6ML
6.0000 mg | Freq: Once | SUBCUTANEOUS | Status: AC
  Administered 2012-07-12: 6 mg via SUBCUTANEOUS

## 2012-07-12 NOTE — Progress Notes (Signed)
[facility] [phone]   [phone]  OFFICE PROGRESS NOTE  Dorrene German, MD [address] [place] [address]  DIAGNOSIS: Metastatic non-small cell lung cancer, adenocarcinoma, diagnosed in June 2012.   PRIOR THERAPY:  1. Status post left Pleurx catheter placement by Dr. Edwyna Shell on February 08, 2011 for drainage of left sided pleural effusion. 2. Status post 4 cycles of induction chemotherapy with carboplatin for AUC of 6, paclitaxel 200 mg per meter square and Avastin 15 mg/kg given every 3 weeks according to the ECOG protocol 5508 with stable disease. 3. Status post 1 cycle of maintenance Avastin 15 mg/kg given on May 19, 2011, discontinued secondary to gastrointestinal bleed. 4. Maintenance chemotherapy with Alimta 500 mg/m2 every 3 weeks. Status post 9 cycles, discontinued today secondary to disease progression.   CURRENT THERAPY:  systemic chemotherapy with docetaxel 75 mg/M2 every 3 weeks with Neulasta support. Status post 2 cycles  INTERVAL HISTORY: Johnathan Bowman 78 y.o. male returns to the clinic today for followup visit. He tolerated his first 2 cycles of chemotherapy with docetaxel last dose support relatively well. He complains of itching under both arms and sometimes on his back. Is not noticed any particular rash although he has one lesion on his left upper arm and he is concerned that this may be shingles. He voices no other specific complaints today. He denied any fever or chills.  He denied having any significant nausea or vomiting.  MEDICAL HISTORY: Past Medical History  Diagnosis Date  . Hypertension   . Diabetes mellitus   . Dyslipidemia   . lung ca 11/12/2010    LUL  . Mini stroke 2005  . Degenerative disc disease   . GERD (gastroesophageal reflux disease)   . Osteopenia 2005  . Coronary artery disease 2005    treated medically  . BPH (benign prostatic hyperplasia)   . DVT (deep venous thrombosis)   . History  of chemotherapy   . Diverticulosis     per scan 05/28/12  . Calculus of kidney     per scan 05/28/12    ALLERGIES:  is allergic to penicillins.  MEDICATIONS:  Current Outpatient Prescriptions  Medication Sig Dispense Refill  . acetaminophen (TYLENOL) 500 MG tablet Take 500 mg by mouth every 6 (six) hours as needed. For pain.      Marland Kitchen amLODipine-benazepril (LOTREL) 5-10 MG per capsule Take 1 capsule by mouth daily.        Marland Kitchen aspirin 81 MG tablet Take 81 mg by mouth daily.       Marland Kitchen dexamethasone (DECADRON) 4 MG tablet Take 4 mg by mouth 2 (two) times daily with a meal. 2 tabs by mouth BID, the day before, the day of and the day after chemotherapy      . glyBURIDE-metformin (GLUCOVANCE) 5-500 MG per tablet Take 2 tablets by mouth 2 (two) times daily.        Marland Kitchen HYDROcodone-acetaminophen (VICODIN) 5-500 MG per tablet Take 1-2 tablets by mouth every 6 (six) hours as needed. For pain.  60 tablet  0  . hydrOXYzine (ATARAX/VISTARIL) 25 MG tablet Take 1 tablet by mouth every 8 hours as needed for itching  30 tablet  0  . ibuprofen (ADVIL,MOTRIN) 200 MG tablet Take 200 mg by mouth every 6 (six) hours as needed.      . lidocaine-prilocaine (EMLA) cream Apply 1 application topically as needed. Apply to [facility] A Cath site before chemotherapy.       Marland Kitchen  morphine (MS CONTIN) 30 MG 12 hr tablet Take 1 tablet (30 mg total) by mouth 2 (two) times daily.  60 tablet  0  . pantoprazole (PROTONIX) 40 MG tablet Take 1 tablet (40 mg total) by mouth 2 (two) times daily.  30 tablet  3  . warfarin (COUMADIN) 5 MG tablet Take 7.5 mg by mouth daily. One and one-half tablets       No current facility-administered medications for this visit.   Facility-Administered Medications Ordered in Other Visits  Medication Dose Route Frequency Provider Last Rate Last Dose  . 0.9 %  sodium chloride infusion   Intravenous Once Conni Slipper, PA      . DOCEtaxel (TAXOTERE) 150 mg in dextrose 5 % 250 mL chemo infusion  75 mg/m2  (Treatment Plan Actual) Intravenous Once Conni Slipper, PA   150 mg at 07/11/12 1415  . heparin lock flush 100 unit/mL  500 Units Intracatheter Once PRN Conni Slipper, PA   500 Units at 07/11/12 1525  . pegfilgrastim (NEULASTA) injection 6 mg  6 mg Subcutaneous Once Conni Slipper, PA   6 mg at 07/12/12 0957  . DISCONTD: sodium chloride 0.9 % injection 10 mL  10 mL Intracatheter PRN Conni Slipper, PA   10 mL at 07/11/12 1525    SURGICAL HISTORY:  Past Surgical History  Procedure Date  . No past surgeries   . Pleurex cath 02/08/11    left-sided pleural effusion    REVIEW OF SYSTEMS:  A comprehensive review of systems was negative except for: Itching affecting his upper back and under both arms with a single lesion he is concerned may be a another shingles outbreak.   PHYSICAL EXAMINATION: General appearance: alert, cooperative, fatigued and no distress Head: Normocephalic, without obvious abnormality, atraumatic Neck: no adenopathy Lymph nodes: Cervical, supraclavicular, and axillary nodes normal. Resp: diminished breath sounds RLL Cardio: regular rate and rhythm, S1, S2 normal, no murmur, click, rub or gallop GI: soft, non-tender; bowel sounds normal; no masses,  no organomegaly Extremities: edema 2+ pitting edema left lower extremity, 1+ pitting edema right lower extremity. No areas of point tenderness. Neurologic: Alert and oriented X 3, normal strength and tone. Normal symmetric reflexes. Normal coordination and gait Skin: no rash or masses noted in the axilla bilaterally or on the back. There is one lone lesion on the left medial bicep that is consistent with an insect bite. There are no vesicular lesions present  ECOG PERFORMANCE STATUS: 1 - Symptomatic but completely ambulatory  Blood pressure 144/83, pulse 99, temperature 96.9 F (36.1 C), temperature source Oral, height 5\' 5"  (1.651 m), weight 189 lb 3.2 oz (85.821 kg).  LABORATORY DATA: Lab Results  Component  Value Date   WBC 10.2 07/11/2012   HGB 11.0* 07/11/2012   HCT 35.4* 07/11/2012   MCV 92.7 07/11/2012   PLT 238 07/11/2012      Chemistry      Component Value Date/Time   NA 138 07/11/2012 1002   NA 139 11/07/2011 1621   K 4.7 07/11/2012 1002   K 4.8* 11/07/2011 1621   CL 103 07/11/2012 1002   CL 97* 11/07/2011 1621   CO2 24 07/11/2012 1002   CO2 30 11/07/2011 1621   BUN 11 07/11/2012 1002   BUN 11 11/07/2011 1621   CREATININE 0.76 07/11/2012 1002   CREATININE 0.9 11/07/2011 1621      Component Value Date/Time   CALCIUM 9.0 07/11/2012 1002   CALCIUM 9.4 11/07/2011 1621  ALKPHOS 84 07/11/2012 1002   ALKPHOS 100* 11/07/2011 1621   AST 14 07/11/2012 1002   AST 21 11/07/2011 1621   ALT 11 07/11/2012 1002   BILITOT 0.2* 07/11/2012 1002   BILITOT 0.50 11/07/2011 1621       RADIOGRAPHIC STUDIES: Ct Chest W Contrast  05/28/2012  *RADIOLOGY REPORT*  Clinical Data:  History of lung cancer diagnosed in January 2012 status post chemotherapy.  Shortness of breath.  Posterior chest tightness.  CT CHEST, ABDOMEN AND PELVIS WITH CONTRAST  Technique:  Multidetector CT imaging of the chest, abdomen and pelvis was performed following the standard protocol during bolus administration of intravenous contrast.  Contrast: OMNIPAQUE IOHEXOL 300 MG/ML  SOLN  Comparison:  CT chest 03/23/2012.  CT of the chest abdomen and pelvis 11/07/2011.   CT CHEST  Findings:  Mediastinum: There has been interval enlargement of an AP window lymph node which currently measures 19 mm (previously 17 mm) short axis.  Borderline enlarged anterior mediastinal lymph node has slightly increased and currently measures 1 cm in short axis. Heart size is upper limits of normal. There is no significant pericardial fluid, thickening or pericardial calcification.  Esophagus is unremarkable in appearance. There is atherosclerosis of the thoracic aorta, the great vessels of the mediastinum and the coronary arteries, including calcified  atherosclerotic plaque in the distal left main, left anterior descending, left circumflex and right coronary arteries. There is a right-sided internal jugular single lumen Port-A-Cath with tip terminating in the distal superior vena cava.  Lungs/Pleura: Left upper lobe paramediastinal mass (image 25 of series 5) appears slightly larger than the prior examination, currently measuring 1.8 x 3.3 cm.  Pleural based nodularity throughout the left hemithorax appears slightly increased compared to the prior study, and is clearly increased compared to more remote prior examinations.  Specific examples included a pleural based nodule in the periphery of the left upper lobe (image 17 of series 5) that has increased in thickness to 10 mm on today's examination (previously 8 mm). Another example as a pleural-based nodule in the periphery of the left upper lobe (image 20 of series 5) which currently measures 1 cm (previously 5 mm).  The chronic left-sided pleural effusion has slightly increased in size and demonstrates increasing pleural thickening and nodular enhancement when compared to the prior examinations. Some chronic pleural based calcifications are also noted the base of the left hemithorax (unchanged).  Unchanged 4 mm nodule in the superior segment of the right lower lobe (image 21 of series 5).  Mild paraseptal emphysema in the apices of the lungs bilaterally.  Area of interstitial prominence in the dependent portion of the right lower lobe which is increased compared to prior examinations, but has an appearance most suggestive of scarring (potentially from recurrent episodes of aspiration).  Musculoskeletal: There are no aggressive appearing lytic or blastic lesions noted in the visualized portions of the skeleton.  IMPRESSION:  1.  Findings, as above, consistent with progression of disease, including enlargement of the primary left upper lobe mass, increasing pleural-based metastases in the left hemithorax, and  enlarging left AP window and anterior mediastinal adenopathy. 2. Atherosclerosis, including left main and three-vessel coronary artery disease. Please note that although the presence of coronary artery calcium documents the presence of coronary artery disease, the severity of this disease and any potential stenosis cannot be assessed on this non-gated CT examination.  Assessment for potential risk factor modification, dietary therapy or pharmacologic therapy may be warranted, if clinically indicated. 3.  Additional incidental findings, as above.   CT ABDOMEN AND PELVIS  Findings:  Abdomen/Pelvis: In segment 2 of the liver there are two small low attenuation lesions which are similar to prior studies, and compatible with cysts, the largest of which measures 2.3 x 1.6 cm. No other definite aggressive appearing hepatic lesions are noted on today's examination.  The enhanced appearance of the gallbladder, pancreas, spleen and bilateral adrenal glands is unremarkable.  In the right renal collecting system and there is a large calculus which measures 17 mm. Multiple low attenuation renal lesions are compatible with cysts, the largest of which is in the right kidney measuring 4.6 cm.  Several renal lesions are too small to definitively characterize, but are similar to prior examinations. An IVC filter is in place with tip terminating just below the level of the renal veins bilaterally.  There is atherosclerosis of the abdominal and pelvic vasculature, without evidence of aneurysm or dissection at this time.  There is no ascites or pneumoperitoneum and no pathologic distension of bowel.  No definite pathologic lymphadenopathy identified within the abdomen or pelvis.  There is extensive colonic diverticulosis, most pronounced in the region of the sigmoid colon, without surrounding inflammatory changes to suggest an acute diverticulitis at this time.  Normal appendix.  Prostate and urinary bladder are unremarkable in  appearance.  Musculoskeletal: There are no aggressive appearing lytic or blastic lesions noted in the visualized portions of the skeleton.  IMPRESSION:  1.  No findings to suggest metastatic disease to the abdomen or pelvis on today's examination. 2.  Multiple renal lesions are again noted bilaterally, all of which appears similar to the prior examination.  Several of these are too small to definitively characterize, with the larger lesions all compatible with simple cysts. 3.  In addition, there is a 17 mm nonobstructive calculus in the interpolar collecting system of the right kidney. 4.  Extensive colonic diverticulosis without evidence to suggest acute diverticulitis. 5.  Hepatic cysts in segment 2 of the liver are unchanged compared to prior examinations. 6.  Additional incidental findings, as above.  Original Report Authenticated By: Florencia Reasons, M.D.    ASSESSMENT/PLAN: This is a very pleasant 78 years old African American male with metastatic non-small cell lung cancer recently treated with 3 cycles of chemotherapy with docetaxel but unfortunately has evidence for disease progression involving the left pulmonary mass as well as the pleural-based masses. The patient was discussed with Dr. Arbutus Ped. He will continue with weekly labs consisting of a CBC differential and C. met. He'll proceed with cycle #3 of his systemic chemotherapy with docetaxel at 75 mg per meter square given every 3 weeks with Neulasta support. He'll followup with Dr. Arbutus Ped in 3 weeks with repeat CBC differential and C. met as well as a CT of the chest abdomen and pelvis with contrast to reevaluate his disease. Prescription for Atarax sent to his pharmacy of record via E. scribe to address his complaints of itching.  He'll continue to followup with the [facility] regarding his anticoagulation therapy related to his left lower extremity deep vein thrombosis.  Laural Benes, Subrena Devereux E, PA-C   All  questions were answered. The patient knows to call the clinic with any problems, questions or concerns. We can certainly see the patient much sooner if necessary.  I spent 20 minutes counseling the patient face to face. The total time spent in the appointment was 30 minutes.

## 2012-07-18 ENCOUNTER — Other Ambulatory Visit (HOSPITAL_BASED_OUTPATIENT_CLINIC_OR_DEPARTMENT_OTHER): Payer: Medicare Other | Admitting: Lab

## 2012-07-18 DIAGNOSIS — C3490 Malignant neoplasm of unspecified part of unspecified bronchus or lung: Secondary | ICD-10-CM

## 2012-07-18 LAB — COMPREHENSIVE METABOLIC PANEL WITH GFR
ALT: 14 U/L (ref 0–53)
AST: 14 U/L (ref 0–37)
Albumin: 3.4 g/dL — ABNORMAL LOW (ref 3.5–5.2)
Alkaline Phosphatase: 80 U/L (ref 39–117)
BUN: 20 mg/dL (ref 6–23)
CO2: 23 meq/L (ref 19–32)
Calcium: 8.6 mg/dL (ref 8.4–10.5)
Chloride: 102 meq/L (ref 96–112)
Creatinine, Ser: 1.09 mg/dL (ref 0.50–1.35)
Glucose, Bld: 184 mg/dL — ABNORMAL HIGH (ref 70–99)
Potassium: 4.4 meq/L (ref 3.5–5.3)
Sodium: 139 meq/L (ref 135–145)
Total Bilirubin: 0.7 mg/dL (ref 0.3–1.2)
Total Protein: 5.8 g/dL — ABNORMAL LOW (ref 6.0–8.3)

## 2012-07-18 LAB — CBC WITH DIFFERENTIAL/PLATELET
BASO%: 0.1 % (ref 0.0–2.0)
Basophils Absolute: 0 10*3/uL (ref 0.0–0.1)
EOS%: 1.7 % (ref 0.0–7.0)
HCT: 34.1 % — ABNORMAL LOW (ref 38.4–49.9)
HGB: 10.8 g/dL — ABNORMAL LOW (ref 13.0–17.1)
LYMPH%: 13.2 % — ABNORMAL LOW (ref 14.0–49.0)
MCH: 29.5 pg (ref 27.2–33.4)
MCHC: 31.8 g/dL — ABNORMAL LOW (ref 32.0–36.0)
MCV: 92.8 fL (ref 79.3–98.0)
NEUT%: 70.2 % (ref 39.0–75.0)
Platelets: 196 10*3/uL (ref 140–400)
lymph#: 0.4 10*3/uL — ABNORMAL LOW (ref 0.9–3.3)

## 2012-07-25 ENCOUNTER — Other Ambulatory Visit (HOSPITAL_BASED_OUTPATIENT_CLINIC_OR_DEPARTMENT_OTHER): Payer: Medicare Other | Admitting: Lab

## 2012-07-25 DIAGNOSIS — C3410 Malignant neoplasm of upper lobe, unspecified bronchus or lung: Secondary | ICD-10-CM

## 2012-07-25 DIAGNOSIS — C3490 Malignant neoplasm of unspecified part of unspecified bronchus or lung: Secondary | ICD-10-CM

## 2012-07-25 LAB — CBC WITH DIFFERENTIAL/PLATELET
Basophils Absolute: 0 10*3/uL (ref 0.0–0.1)
HCT: 35 % — ABNORMAL LOW (ref 38.4–49.9)
HGB: 11.3 g/dL — ABNORMAL LOW (ref 13.0–17.1)
LYMPH%: 6.3 % — ABNORMAL LOW (ref 14.0–49.0)
MCH: 30 pg (ref 27.2–33.4)
MCHC: 32.2 g/dL (ref 32.0–36.0)
MONO#: 0.5 10*3/uL (ref 0.1–0.9)
NEUT%: 88.3 % — ABNORMAL HIGH (ref 39.0–75.0)
Platelets: 191 10*3/uL (ref 140–400)
WBC: 9.3 10*3/uL (ref 4.0–10.3)
lymph#: 0.6 10*3/uL — ABNORMAL LOW (ref 0.9–3.3)

## 2012-07-25 LAB — COMPREHENSIVE METABOLIC PANEL
BUN: 9 mg/dL (ref 6–23)
CO2: 25 mEq/L (ref 19–32)
Calcium: 8.3 mg/dL — ABNORMAL LOW (ref 8.4–10.5)
Chloride: 105 mEq/L (ref 96–112)
Creatinine, Ser: 0.97 mg/dL (ref 0.50–1.35)
Glucose, Bld: 182 mg/dL — ABNORMAL HIGH (ref 70–99)
Total Bilirubin: 0.3 mg/dL (ref 0.3–1.2)

## 2012-07-31 ENCOUNTER — Telehealth: Payer: Self-pay | Admitting: *Deleted

## 2012-07-31 NOTE — Telephone Encounter (Signed)
Patient confirmed over the phone the new date and time for the ct scan

## 2012-08-01 ENCOUNTER — Ambulatory Visit (HOSPITAL_COMMUNITY)
Admission: RE | Admit: 2012-08-01 | Discharge: 2012-08-01 | Disposition: A | Discharge status: Home / Self Care | Payer: Medicare Other | Source: Ambulatory Visit | Attending: Physician Assistant | Admitting: Physician Assistant

## 2012-08-01 ENCOUNTER — Ambulatory Visit (HOSPITAL_BASED_OUTPATIENT_CLINIC_OR_DEPARTMENT_OTHER): Payer: Medicare Other

## 2012-08-01 ENCOUNTER — Other Ambulatory Visit (HOSPITAL_BASED_OUTPATIENT_CLINIC_OR_DEPARTMENT_OTHER): Payer: Medicare Other | Admitting: Lab

## 2012-08-01 ENCOUNTER — Ambulatory Visit (HOSPITAL_BASED_OUTPATIENT_CLINIC_OR_DEPARTMENT_OTHER): Payer: Medicare Other | Admitting: Internal Medicine

## 2012-08-01 VITALS — BP 136/82 | HR 109 | Temp 97.0°F | Resp 20 | Ht 65.0 in | Wt 188.8 lb

## 2012-08-01 DIAGNOSIS — R599 Enlarged lymph nodes, unspecified: Secondary | ICD-10-CM | POA: Insufficient documentation

## 2012-08-01 DIAGNOSIS — C3410 Malignant neoplasm of upper lobe, unspecified bronchus or lung: Secondary | ICD-10-CM

## 2012-08-01 DIAGNOSIS — Z5111 Encounter for antineoplastic chemotherapy: Secondary | ICD-10-CM

## 2012-08-01 DIAGNOSIS — C3490 Malignant neoplasm of unspecified part of unspecified bronchus or lung: Secondary | ICD-10-CM

## 2012-08-01 DIAGNOSIS — N289 Disorder of kidney and ureter, unspecified: Secondary | ICD-10-CM

## 2012-08-01 DIAGNOSIS — R0602 Shortness of breath: Secondary | ICD-10-CM | POA: Insufficient documentation

## 2012-08-01 DIAGNOSIS — K7689 Other specified diseases of liver: Secondary | ICD-10-CM

## 2012-08-01 DIAGNOSIS — I7090 Unspecified atherosclerosis: Secondary | ICD-10-CM | POA: Insufficient documentation

## 2012-08-01 DIAGNOSIS — Z86718 Personal history of other venous thrombosis and embolism: Secondary | ICD-10-CM | POA: Insufficient documentation

## 2012-08-01 DIAGNOSIS — J438 Other emphysema: Secondary | ICD-10-CM | POA: Insufficient documentation

## 2012-08-01 DIAGNOSIS — J90 Pleural effusion, not elsewhere classified: Secondary | ICD-10-CM | POA: Insufficient documentation

## 2012-08-01 DIAGNOSIS — Z79899 Other long term (current) drug therapy: Secondary | ICD-10-CM | POA: Insufficient documentation

## 2012-08-01 DIAGNOSIS — C801 Malignant (primary) neoplasm, unspecified: Secondary | ICD-10-CM

## 2012-08-01 DIAGNOSIS — N200 Calculus of kidney: Secondary | ICD-10-CM | POA: Insufficient documentation

## 2012-08-01 DIAGNOSIS — Z923 Personal history of irradiation: Secondary | ICD-10-CM | POA: Insufficient documentation

## 2012-08-01 DIAGNOSIS — I2510 Atherosclerotic heart disease of native coronary artery without angina pectoris: Secondary | ICD-10-CM | POA: Insufficient documentation

## 2012-08-01 LAB — CBC WITH DIFFERENTIAL/PLATELET
BASO%: 0.6 % (ref 0.0–2.0)
Basophils Absolute: 0 10*3/uL (ref 0.0–0.1)
Eosinophils Absolute: 0 10*3/uL (ref 0.0–0.5)
HCT: 36.2 % — ABNORMAL LOW (ref 38.4–49.9)
HGB: 11.6 g/dL — ABNORMAL LOW (ref 13.0–17.1)
MCHC: 32.1 g/dL (ref 32.0–36.0)
MONO#: 0.8 10*3/uL (ref 0.1–0.9)
NEUT#: 4.1 10*3/uL (ref 1.5–6.5)
NEUT%: 66.3 % (ref 39.0–75.0)
Platelets: 247 10*3/uL (ref 140–400)
WBC: 6.2 10*3/uL (ref 4.0–10.3)
lymph#: 1.2 10*3/uL (ref 0.9–3.3)

## 2012-08-01 LAB — COMPREHENSIVE METABOLIC PANEL
ALT: 11 U/L (ref 0–53)
CO2: 26 mEq/L (ref 19–32)
Calcium: 8.4 mg/dL (ref 8.4–10.5)
Chloride: 106 mEq/L (ref 96–112)
Creatinine, Ser: 0.85 mg/dL (ref 0.50–1.35)
Glucose, Bld: 74 mg/dL (ref 70–99)

## 2012-08-01 MED ORDER — SODIUM CHLORIDE 0.9 % IV SOLN
Freq: Once | INTRAVENOUS | Status: AC
  Administered 2012-08-01: 12:00:00 via INTRAVENOUS

## 2012-08-01 MED ORDER — IOHEXOL 300 MG/ML  SOLN
100.0000 mL | Freq: Once | INTRAMUSCULAR | Status: AC | PRN
  Administered 2012-08-01: 100 mL via INTRAVENOUS

## 2012-08-01 MED ORDER — DOCETAXEL CHEMO INJECTION 160 MG/16ML
75.0000 mg/m2 | Freq: Once | INTRAVENOUS | Status: AC
  Administered 2012-08-01: 150 mg via INTRAVENOUS
  Filled 2012-08-01: qty 15

## 2012-08-01 MED ORDER — DEXAMETHASONE SODIUM PHOSPHATE 10 MG/ML IJ SOLN
10.0000 mg | Freq: Once | INTRAMUSCULAR | Status: AC
  Administered 2012-08-01: 10 mg via INTRAVENOUS

## 2012-08-01 MED ORDER — SODIUM CHLORIDE 0.9 % IJ SOLN
10.0000 mL | INTRAMUSCULAR | Status: DC | PRN
  Administered 2012-08-01: 10 mL
  Filled 2012-08-01: qty 10

## 2012-08-01 MED ORDER — ONDANSETRON 8 MG/50ML IVPB (CHCC)
8.0000 mg | Freq: Once | INTRAVENOUS | Status: AC
  Administered 2012-08-01: 8 mg via INTRAVENOUS

## 2012-08-01 MED ORDER — HEPARIN SOD (PORK) LOCK FLUSH 100 UNIT/ML IV SOLN
500.0000 [IU] | Freq: Once | INTRAVENOUS | Status: AC | PRN
  Administered 2012-08-01: 500 [IU]
  Filled 2012-08-01: qty 5

## 2012-08-01 NOTE — Patient Instructions (Addendum)
[facility] Discharge Instructions for Patients Receiving Chemotherapy  Today you received the following chemotherapy agents taxotere.  To help prevent nausea and vomiting after your treatment, we encourage you to take your nausea medication  Begin taking it tonight  and take it as often as prescribed for the next 24-72 hours   If you develop nausea and vomiting that is not controlled by your nausea medication, call the clinic. If it is after clinic hours your family physician or the after hours number for the clinic or go to the Emergency Department.   BELOW ARE SYMPTOMS THAT SHOULD BE REPORTED IMMEDIATELY:  *FEVER GREATER THAN 100.5 F  *CHILLS WITH OR WITHOUT FEVER  NAUSEA AND VOMITING THAT IS NOT CONTROLLED WITH YOUR NAUSEA MEDICATION  *UNUSUAL SHORTNESS OF BREATH  *UNUSUAL BRUISING OR BLEEDING  TENDERNESS IN MOUTH AND THROAT WITH OR WITHOUT PRESENCE OF ULCERS  *URINARY PROBLEMS  *BOWEL PROBLEMS  UNUSUAL RASH Items with * indicate a potential emergency and should be followed up as soon as possible. . Feel free to call the clinic you have any questions or concerns. The clinic phone number is [phone].   I have been informed and understand all the instructions given to me. I know to contact the clinic, my physician, or go to the Emergency Department if any problems should occur. I do not have any questions at this time, but understand that I may call the clinic during office hours   should I have any questions or need assistance in obtaining follow up care.    __________________________________________  _____________  __________ Signature of Patient or Authorized Representative            Date                   Time    __________________________________________ Nurse's Signature

## 2012-08-01 NOTE — Progress Notes (Signed)
[facility] Telephone:([phone]   Fax:([phone]) [phone]  OFFICE PROGRESS NOTE  Dorrene German, MD [address] [place] [address]  DIAGNOSIS: Metastatic non-small cell lung cancer, adenocarcinoma, diagnosed in June 2012.   PRIOR THERAPY:  1. Status post left Pleurx catheter placement by Dr. Edwyna Shell on February 08, 2011 for drainage of left sided pleural effusion. 2. Status post 4 cycles of induction chemotherapy with carboplatin for AUC of 6, paclitaxel 200 mg per meter square and Avastin 15 mg/kg given every 3 weeks according to the ECOG protocol 5508 with stable disease. 3. Status post 1 cycle of maintenance Avastin 15 mg/kg given on May 19, 2011, discontinued secondary to gastrointestinal bleed. 4. Maintenance chemotherapy with Alimta 500 mg/m2 every 3 weeks. Status post 9 cycles, discontinued today secondary to disease progression.   CURRENT THERAPY: systemic chemotherapy with docetaxel 75 mg/M2 every 3 weeks with Neulasta support. Status post 3 cycles.  INTERVAL HISTORY: Johnathan Bowman 78 y.o. male returns to the clinic today for followup visit. The patient is doing fine and tolerating his systemic chemotherapy with single agent docetaxel fairly well. He denied having any significant fever or chills, no nausea or vomiting. He denied having any significant chest pain, shortness breath, cough or hemoptysis. No significant peripheral neuropathy. He has repeat CT scan of the chest, abdomen and pelvis performed recently and he is here today for evaluation and discussion of his scan results.  MEDICAL HISTORY: Past Medical History  Diagnosis Date  . Hypertension   . Diabetes mellitus   . Dyslipidemia   . lung ca 11/12/2010    LUL  . Mini stroke 2005  . Degenerative disc disease   . GERD (gastroesophageal reflux disease)   . Osteopenia 2005  . Coronary artery disease 2005    treated medically  . BPH (benign prostatic hyperplasia)   . DVT (deep venous  thrombosis)   . History of chemotherapy   . Diverticulosis     per scan 05/28/12  . Calculus of kidney     per scan 05/28/12    ALLERGIES:  is allergic to penicillins.  MEDICATIONS:  Current Outpatient Prescriptions  Medication Sig Dispense Refill  . acetaminophen (TYLENOL) 500 MG tablet Take 500 mg by mouth every 6 (six) hours as needed. For pain.      Marland Kitchen amLODipine-benazepril (LOTREL) 5-10 MG per capsule Take 1 capsule by mouth daily.        Marland Kitchen aspirin 81 MG tablet Take 81 mg by mouth daily.       Marland Kitchen dexamethasone (DECADRON) 4 MG tablet Take 4 mg by mouth 2 (two) times daily with a meal. 2 tabs by mouth BID, the day before, the day of and the day after chemotherapy      . glyBURIDE-metformin (GLUCOVANCE) 5-500 MG per tablet Take 2 tablets by mouth 2 (two) times daily.        Marland Kitchen HYDROcodone-acetaminophen (VICODIN) 5-500 MG per tablet Take 1-2 tablets by mouth every 6 (six) hours as needed. For pain.  60 tablet  0  . hydrOXYzine (ATARAX/VISTARIL) 25 MG tablet Take 1 tablet by mouth every 8 hours as needed for itching  30 tablet  0  . ibuprofen (ADVIL,MOTRIN) 200 MG tablet Take 200 mg by mouth every 6 (six) hours as needed.      . lidocaine-prilocaine (EMLA) cream Apply 1 application topically as needed. Apply to [facility] Cath site before chemotherapy.       Marland Kitchen morphine (  MS CONTIN) 30 MG 12 hr tablet Take 1 tablet (30 mg total) by mouth 2 (two) times daily.  60 tablet  0  . pantoprazole (PROTONIX) 40 MG tablet Take 1 tablet (40 mg total) by mouth 2 (two) times daily.  30 tablet  3  . warfarin (COUMADIN) 5 MG tablet Take 7.5 mg by mouth daily. One and one-half tablets       No current facility-administered medications for this visit.   Facility-Administered Medications Ordered in Other Visits  Medication Dose Route Frequency Provider Last Rate Last Dose  . iohexol (OMNIPAQUE) 300 MG/ML solution 100 mL  100 mL Intravenous Once PRN Medication Radiologist, MD   100 mL at 08/01/12 0942     SURGICAL HISTORY:  Past Surgical History  Procedure Date  . No past surgeries   . Pleurex cath 02/08/11    left-sided pleural effusion    REVIEW OF SYSTEMS:  A comprehensive review of systems was negative except for: Constitutional: positive for fatigue   PHYSICAL EXAMINATION: General appearance: alert, cooperative and no distress Head: Normocephalic, without obvious abnormality, atraumatic Neck: no adenopathy Lymph nodes: Cervical, supraclavicular, and axillary nodes normal. Resp: clear to auscultation bilaterally Back: symmetric, no curvature. ROM normal. No CVA tenderness. Cardio: regular rate and rhythm, S1, S2 normal, no murmur, click, rub or gallop GI: soft, non-tender; bowel sounds normal; no masses,  no organomegaly Extremities: extremities normal, atraumatic, no cyanosis or edema Neurologic: Alert and oriented X 3, normal strength and tone. Normal symmetric reflexes. Normal coordination and gait  ECOG PERFORMANCE STATUS: 1 - Symptomatic but completely ambulatory  Blood pressure 136/82, pulse 109, temperature 97 F (36.1 C), temperature source Oral, resp. rate 20, height 5\' 5"  (1.651 m), weight 188 lb 12.8 oz (85.639 kg).  LABORATORY DATA: Lab Results  Component Value Date   WBC 6.2 08/01/2012   HGB 11.6* 08/01/2012   HCT 36.2* 08/01/2012   MCV 92.8 08/01/2012   PLT 247 08/01/2012      Chemistry      Component Value Date/Time   NA 140 07/25/2012 0855   NA 139 11/07/2011 1621   K 4.2 07/25/2012 0855   K 4.8* 11/07/2011 1621   CL 105 07/25/2012 0855   CL 97* 11/07/2011 1621   CO2 25 07/25/2012 0855   CO2 30 11/07/2011 1621   BUN 9 07/25/2012 0855   BUN 11 11/07/2011 1621   CREATININE 0.97 07/25/2012 0855   CREATININE 0.9 11/07/2011 1621      Component Value Date/Time   CALCIUM 8.3* 07/25/2012 0855   CALCIUM 9.4 11/07/2011 1621   ALKPHOS 85 07/25/2012 0855   ALKPHOS 100* 11/07/2011 1621   AST 14 07/25/2012 0855   AST 21 11/07/2011 1621   ALT 11 07/25/2012 0855   BILITOT  0.3 07/25/2012 0855   BILITOT 0.50 11/07/2011 1621       RADIOGRAPHIC STUDIES: Ct Chest W Contrast  08/01/2012  *RADIOLOGY REPORT*  Clinical Data:  Lung cancer.  Ongoing chemotherapy.  Radiation therapy completed June 25, 2012.  Shortness of breath.  History of DVT.  CT CHEST, ABDOMEN AND PELVIS WITH CONTRAST  Technique:  Multidetector CT imaging of the chest, abdomen and pelvis was performed following the standard protocol during bolus administration of intravenous contrast.  Contrast: OMNIPAQUE IOHEXOL 300 MG/ML  SOLN  Comparison:  05/28/2012.   CT CHEST  Findings:  Prevascular lymph node measures 1.4 cm (previously 0.7 cm).  Lymph node anterior to the left pulmonary artery measures 1.3 cm (previously  1.9 cm).  Anterior mediastinal lymph node measures 8 mm in short axis (previously 10 mm).  No axillary adenopathy. Atherosclerotic calcification of the arterial vasculature, including extensive involvement of the coronary arteries.  Heart is at the upper limits of normal in size.  No pericardial effusion.  Small loculated left pleural effusion, slightly decreased in size from 05/28/2012.  High attenuation material along the inferior left pleural margin is indicative of talc pleurodesis. Persistent rounded consolidation in the inferior left lower lobe. Improving pleural nodularity in the left hemithorax. Residual soft tissue thickening is seen in the medial aspect of the left upper lobe, along the mediastinal margin, measuring approximately 0.7 x 2.4 cm (previously 1.8 x 3.3 cm).  Emphysema. Patchy ground-glass in the posterior segment right upper lobe is nonspecific.  Mild volume loss in the right lower lobe.  No right pleural effusion.  Airway is otherwise unremarkable.  IMPRESSION:  1.  Overall response to therapy as evidenced by decrease in size of the left upper lobe mass, decrease in size involving the majority of mediastinal adenopathy, improving left pleural nodularity and slight decrease in size  of a loculated left pleural effusion. Rounded consolidation in the left lower lobe is unchanged. 2.  Prevascular lymph node measures slightly larger.  Continued attention on follow-up exams is warranted.   CT ABDOMEN AND PELVIS  Findings:  Low attenuation lesions in the liver measure up to 2.1 cm in the left hepatic lobe, as before.  Liver, gallbladder and adrenal glands are otherwise unremarkable.  Low attenuation lesions in the kidneys measure up to 4.3 cm on the right, stable. Right renal stone are Kidneys, spleen, pancreas, stomach and bowel are otherwise unremarkable.  Atherosclerotic calcification of the arterial vasculature without abdominal aortic aneurysm.  IVC filter is in place.  No pathologically enlarged lymph nodes.  No free fluid.  No worrisome lytic or sclerotic lesions.  Degenerative changes are seen in the spine.  IMPRESSION:  1.  No evidence of metastatic disease in the abdomen or pelvis. 2.  Nonobstructing right renal stone.  Original Report Authenticated By: Reyes Ivan, M.D.     ASSESSMENT: This is a very pleasant 78 years old [place] American male with metastatic non-small cell lung cancer currently on systemic chemotherapy with single agent docetaxel status post 3 cycles with improvement in his disease.  PLAN: I discussed the scan results with the patient. I recommended for him to continue on the same treatment regimen for now with docetaxel 75 mg/M2 every 3 weeks with Neulasta support. He was started cycle #4 today. He would come back for followup visit in 3 weeks with the start of cycle #5. He was advised to call me immediately if he has any concerning symptoms in the interval.  All questions were answered. The patient knows to call the clinic with any problems, questions or concerns. We can certainly see the patient much sooner if necessary.  I spent 15 minutes counseling the patient face to face. The total time spent in the appointment was 25 minutes.

## 2012-08-02 ENCOUNTER — Telehealth: Payer: Self-pay | Admitting: *Deleted

## 2012-08-02 ENCOUNTER — Telehealth: Payer: Self-pay | Admitting: Internal Medicine

## 2012-08-02 ENCOUNTER — Ambulatory Visit (HOSPITAL_BASED_OUTPATIENT_CLINIC_OR_DEPARTMENT_OTHER): Payer: Medicare Other

## 2012-08-02 VITALS — BP 127/69 | HR 110 | Temp 97.7°F

## 2012-08-02 DIAGNOSIS — C3410 Malignant neoplasm of upper lobe, unspecified bronchus or lung: Secondary | ICD-10-CM

## 2012-08-02 DIAGNOSIS — C3490 Malignant neoplasm of unspecified part of unspecified bronchus or lung: Secondary | ICD-10-CM

## 2012-08-02 DIAGNOSIS — C801 Malignant (primary) neoplasm, unspecified: Secondary | ICD-10-CM

## 2012-08-02 MED ORDER — PEGFILGRASTIM INJECTION 6 MG/0.6ML
6.0000 mg | Freq: Once | SUBCUTANEOUS | Status: AC
  Administered 2012-08-02: 6 mg via SUBCUTANEOUS
  Filled 2012-08-02: qty 0.6

## 2012-08-02 NOTE — Telephone Encounter (Signed)
Gave pt appt for August 2013 and September 2013 lab,MD and chemo

## 2012-08-02 NOTE — Telephone Encounter (Signed)
Per staff message and POF I have scheduled appts.  JMW  

## 2012-08-03 ENCOUNTER — Encounter: Payer: Self-pay | Admitting: *Deleted

## 2012-08-03 NOTE — Progress Notes (Signed)
Clinical Social Work received referral from RN stating patient and patient's family member called stating their water has been turned off at their residence and they need assistance. Patient is currently collaborating with [employer].  [employer] requesting confirmation that patient has medical needs to establish crisis assistance. CSW contacted Psychologist, clinical, Director of Emergency Assistance, and confirmed Mr. Sellick is a patient at [facility].  CSW attempted to contact Mr. Muniz, unable to reach him at this time.  Kathrin Penner, MSW, [facility] Clinical Social Worker [facility] [phone]

## 2012-08-06 ENCOUNTER — Telehealth: Payer: Self-pay | Admitting: Medical Oncology

## 2012-08-06 NOTE — Telephone Encounter (Signed)
Before I could call pt back he walked in requesting note from Dr Milinda Pointer for assistance from [employer]-note faxed to [place] [facility] [employer]

## 2012-08-08 ENCOUNTER — Other Ambulatory Visit: Payer: Medicare Other | Admitting: Lab

## 2012-08-09 ENCOUNTER — Other Ambulatory Visit: Payer: Medicare Other

## 2012-08-10 ENCOUNTER — Other Ambulatory Visit: Payer: Medicare Other

## 2012-08-13 ENCOUNTER — Encounter: Payer: Self-pay | Admitting: Radiation Oncology

## 2012-08-13 DIAGNOSIS — Z923 Personal history of irradiation: Secondary | ICD-10-CM | POA: Insufficient documentation

## 2012-08-15 ENCOUNTER — Encounter: Payer: Self-pay | Admitting: Radiation Oncology

## 2012-08-15 ENCOUNTER — Other Ambulatory Visit (HOSPITAL_BASED_OUTPATIENT_CLINIC_OR_DEPARTMENT_OTHER): Payer: Medicare Other | Admitting: Lab

## 2012-08-15 ENCOUNTER — Ambulatory Visit
Admission: RE | Admit: 2012-08-15 | Discharge: 2012-08-15 | Disposition: A | Discharge status: Home / Self Care | Payer: Medicare Other | Source: Ambulatory Visit | Attending: Radiation Oncology | Admitting: Radiation Oncology

## 2012-08-15 VITALS — BP 129/76 | HR 98 | Temp 98.6°F | Resp 20 | Wt 186.1 lb

## 2012-08-15 DIAGNOSIS — C3490 Malignant neoplasm of unspecified part of unspecified bronchus or lung: Secondary | ICD-10-CM

## 2012-08-15 LAB — CBC WITH DIFFERENTIAL/PLATELET
BASO%: 0.3 % (ref 0.0–2.0)
HCT: 34.3 % — ABNORMAL LOW (ref 38.4–49.9)
MCHC: 32 g/dL (ref 32.0–36.0)
MONO#: 0.8 10*3/uL (ref 0.1–0.9)
NEUT#: 13.9 10*3/uL — ABNORMAL HIGH (ref 1.5–6.5)
NEUT%: 89.5 % — ABNORMAL HIGH (ref 39.0–75.0)
RBC: 3.67 10*6/uL — ABNORMAL LOW (ref 4.20–5.82)
WBC: 15.6 10*3/uL — ABNORMAL HIGH (ref 4.0–10.3)
lymph#: 0.8 10*3/uL — ABNORMAL LOW (ref 0.9–3.3)

## 2012-08-15 LAB — CORRECTED CALCIUM (CC13): Calcium, Corrected: 9 mg/dL (ref 8.4–10.4)

## 2012-08-15 LAB — COMPREHENSIVE METABOLIC PANEL (CC13)
BUN: 16 mg/dL (ref 7.0–26.0)
CO2: 24 mEq/L (ref 22–29)
Chloride: 107 mEq/L (ref 98–107)
Creatinine: 1 mg/dL (ref 0.7–1.3)

## 2012-08-15 NOTE — Progress Notes (Signed)
Followup note:  Mr. Cooksey returns today approximately 1 month following completion of palliative radiotherapy to his left chest in the management of his non-small cell carcinoma of the lung. He remains clinically stable. His dyspnea on exertion is unchanged. He still has occasional productive cough. His left chest pain remains improved. Dr. Arbutus Ped obtained a CT scan of the chest and abdomen which shows a nice response to his disease within the left chest/lung. There is no evidence for metastatic disease to his abdomen/pelvis. He does have long-standing difficulty with his balance and occasional headaches, but these are unchanged. He does have generalized fatigue. He continues with his chemotherapy under the direction Dr. Arbutus Ped The patient missed his scheduled 3T MRI scan for followup of 2 small areas of new enhancement in the left frontal and right cerebellar regions. This will be rescheduled  Physical examination: Alert and oriented. Wt Readings from Last 3 Encounters:  08/15/12 186 lb 1.6 oz (84.414 kg)  08/01/12 188 lb 12.8 oz (85.639 kg)  07/11/12 189 lb 3.2 oz (85.821 kg)   Temp Readings from Last 3 Encounters:  08/15/12 98.6 F (37 C) Oral  08/02/12 97.7 F (36.5 C) Oral  08/01/12 97 F (36.1 C) Oral   BP Readings from Last 3 Encounters:  08/15/12 129/76  08/02/12 127/69  08/01/12 136/82   Pulse Readings from Last 3 Encounters:  08/15/12 98  08/02/12 110  08/01/12 109    Head and neck examination: Grossly unremarkable. Nodes: Without palpable cervical or supraclavicular lymphadenopathy. Chest: Breath sounds are distant and slightly diminished along his left base. Abdomen: Without hepatomegaly. Neurologic examination: Grossly nonfocal.  Laboratory data pending from earlier today.  Impression: Satisfactory palliation. He is clearly responding to his chemotherapy. We will reschedule his 3T MRI scan for followup of his 2 brain lesions. If there is interval change then he can be  presented at the brain [facility] conference. I've not scheduled the patient for a formal followup visit knowing that he is under chemotherapy and followup care through Dr. Arbutus Ped. Dr. Kathrynn Running will be advised of his MRI findings.

## 2012-08-15 NOTE — Progress Notes (Signed)
Pt denies pain, loss of appetite. Does have occasional prod cough w/white/tan sputum, occasional SOB w/exertion.

## 2012-08-22 ENCOUNTER — Telehealth: Payer: Self-pay | Admitting: *Deleted

## 2012-08-22 ENCOUNTER — Ambulatory Visit (HOSPITAL_BASED_OUTPATIENT_CLINIC_OR_DEPARTMENT_OTHER): Payer: Medicare Other | Admitting: Physician Assistant

## 2012-08-22 ENCOUNTER — Ambulatory Visit (HOSPITAL_BASED_OUTPATIENT_CLINIC_OR_DEPARTMENT_OTHER): Payer: Medicare Other

## 2012-08-22 ENCOUNTER — Other Ambulatory Visit (HOSPITAL_BASED_OUTPATIENT_CLINIC_OR_DEPARTMENT_OTHER): Payer: Medicare Other | Admitting: Lab

## 2012-08-22 ENCOUNTER — Encounter: Payer: Self-pay | Admitting: Physician Assistant

## 2012-08-22 VITALS — BP 138/77 | HR 81 | Temp 98.5°F | Resp 20 | Ht 65.0 in | Wt 188.8 lb

## 2012-08-22 DIAGNOSIS — C3490 Malignant neoplasm of unspecified part of unspecified bronchus or lung: Secondary | ICD-10-CM

## 2012-08-22 DIAGNOSIS — C3410 Malignant neoplasm of upper lobe, unspecified bronchus or lung: Secondary | ICD-10-CM

## 2012-08-22 DIAGNOSIS — C801 Malignant (primary) neoplasm, unspecified: Secondary | ICD-10-CM

## 2012-08-22 DIAGNOSIS — Z5111 Encounter for antineoplastic chemotherapy: Secondary | ICD-10-CM

## 2012-08-22 LAB — CORRECTED CALCIUM (CC13): Calcium, Corrected: 9.5 mg/dL (ref 8.4–10.4)

## 2012-08-22 LAB — CBC WITH DIFFERENTIAL/PLATELET
Basophils Absolute: 0 10*3/uL (ref 0.0–0.1)
Eosinophils Absolute: 0 10*3/uL (ref 0.0–0.5)
HCT: 35.6 % — ABNORMAL LOW (ref 38.4–49.9)
HGB: 11 g/dL — ABNORMAL LOW (ref 13.0–17.1)
LYMPH%: 8.2 % — ABNORMAL LOW (ref 14.0–49.0)
MCHC: 30.9 g/dL — ABNORMAL LOW (ref 32.0–36.0)
MONO#: 0.5 10*3/uL (ref 0.1–0.9)
NEUT#: 6.8 10*3/uL — ABNORMAL HIGH (ref 1.5–6.5)
NEUT%: 85.7 % — ABNORMAL HIGH (ref 39.0–75.0)
Platelets: 251 10*3/uL (ref 140–400)
WBC: 7.9 10*3/uL (ref 4.0–10.3)
lymph#: 0.7 10*3/uL — ABNORMAL LOW (ref 0.9–3.3)

## 2012-08-22 LAB — COMPREHENSIVE METABOLIC PANEL (CC13)
ALT: 17 U/L (ref 0–55)
AST: 14 U/L (ref 5–34)
CO2: 23 mEq/L (ref 22–29)
Chloride: 104 mEq/L (ref 98–107)
Sodium: 135 mEq/L — ABNORMAL LOW (ref 136–145)
Total Bilirubin: 0.4 mg/dL (ref 0.20–1.20)
Total Protein: 6 g/dL — ABNORMAL LOW (ref 6.4–8.3)

## 2012-08-22 MED ORDER — DOCETAXEL CHEMO INJECTION 160 MG/16ML
75.0000 mg/m2 | Freq: Once | INTRAVENOUS | Status: AC
  Administered 2012-08-22: 150 mg via INTRAVENOUS
  Filled 2012-08-22: qty 15

## 2012-08-22 MED ORDER — HEPARIN SOD (PORK) LOCK FLUSH 100 UNIT/ML IV SOLN
500.0000 [IU] | Freq: Once | INTRAVENOUS | Status: AC | PRN
  Administered 2012-08-22: 500 [IU]
  Filled 2012-08-22: qty 5

## 2012-08-22 MED ORDER — SODIUM CHLORIDE 0.9 % IV SOLN
Freq: Once | INTRAVENOUS | Status: AC
  Administered 2012-08-22: 12:00:00 via INTRAVENOUS

## 2012-08-22 MED ORDER — ONDANSETRON 8 MG/50ML IVPB (CHCC)
8.0000 mg | Freq: Once | INTRAVENOUS | Status: AC
  Administered 2012-08-22: 8 mg via INTRAVENOUS

## 2012-08-22 MED ORDER — DEXAMETHASONE SODIUM PHOSPHATE 10 MG/ML IJ SOLN
10.0000 mg | Freq: Once | INTRAMUSCULAR | Status: AC
  Administered 2012-08-22: 10 mg via INTRAVENOUS

## 2012-08-22 MED ORDER — SODIUM CHLORIDE 0.9 % IJ SOLN
10.0000 mL | INTRAMUSCULAR | Status: DC | PRN
  Administered 2012-08-22: 10 mL
  Filled 2012-08-22: qty 10

## 2012-08-22 NOTE — Patient Instructions (Addendum)
Follow up in 3 weeks prior to our next cycle of chemotherapy

## 2012-08-22 NOTE — Patient Instructions (Signed)
[facility] Discharge Instructions for Patients Receiving Chemotherapy  Today you received the following chemotherapy agents  taxotere.  To help prevent nausea and vomiting after your treatment, we encourage you to take your nausea medication. Begin taking it at tonight and take it as often as prescribed for the next72} hours. Be sure and take your steroid medicine...decadron as ordered. If you develop nausea and vomiting that is not controlled by your nausea medication, call the clinic. If it is after clinic hours your family physician or the after hours number for the clinic or go to the Emergency Department.   BELOW ARE SYMPTOMS THAT SHOULD BE REPORTED IMMEDIATELY:  *FEVER GREATER THAN 100.5 F  *CHILLS WITH OR WITHOUT FEVER  NAUSEA AND VOMITING THAT IS NOT CONTROLLED WITH YOUR NAUSEA MEDICATION  *UNUSUAL SHORTNESS OF BREATH  *UNUSUAL BRUISING OR BLEEDING  TENDERNESS IN MOUTH AND THROAT WITH OR WITHOUT PRESENCE OF ULCERS  *URINARY PROBLEMS  *BOWEL PROBLEMS  UNUSUAL RASH Items with * indicate a potential emergency and should be followed up as soon as possible.  . Feel free to call the clinic you have any questions or concerns. The clinic phone number is [phone].   I have been informed and understand all the instructions given to me. I know to contact the clinic, my physician, or go to the Emergency Department if any problems should occur. I do not have any questions at this time, but understand that I may call the clinic during office hours   should I have any questions or need assistance in obtaining follow up care.    __________________________________________  _____________  __________ Signature of Patient or Authorized Representative            Date                   Time    __________________________________________ Nurse's Signature

## 2012-08-22 NOTE — Telephone Encounter (Signed)
I have returned the patient's phone call regarding his appts for today. Patient given times for today.  JMW

## 2012-08-23 ENCOUNTER — Ambulatory Visit (HOSPITAL_BASED_OUTPATIENT_CLINIC_OR_DEPARTMENT_OTHER): Payer: Medicare Other

## 2012-08-23 ENCOUNTER — Ambulatory Visit
Admission: RE | Admit: 2012-08-23 | Discharge: 2012-08-23 | Disposition: A | Discharge status: Home / Self Care | Payer: Medicare Other | Source: Ambulatory Visit | Attending: Radiation Oncology | Admitting: Radiation Oncology

## 2012-08-23 ENCOUNTER — Telehealth: Payer: Self-pay | Admitting: *Deleted

## 2012-08-23 VITALS — BP 128/74 | HR 97 | Temp 98.2°F | Resp 20

## 2012-08-23 DIAGNOSIS — C3490 Malignant neoplasm of unspecified part of unspecified bronchus or lung: Secondary | ICD-10-CM

## 2012-08-23 DIAGNOSIS — C3410 Malignant neoplasm of upper lobe, unspecified bronchus or lung: Secondary | ICD-10-CM

## 2012-08-23 DIAGNOSIS — C801 Malignant (primary) neoplasm, unspecified: Secondary | ICD-10-CM

## 2012-08-23 DIAGNOSIS — Z5189 Encounter for other specified aftercare: Secondary | ICD-10-CM

## 2012-08-23 DIAGNOSIS — C782 Secondary malignant neoplasm of pleura: Secondary | ICD-10-CM

## 2012-08-23 MED ORDER — PEGFILGRASTIM INJECTION 6 MG/0.6ML
6.0000 mg | Freq: Once | SUBCUTANEOUS | Status: AC
  Administered 2012-08-23: 6 mg via SUBCUTANEOUS
  Filled 2012-08-23: qty 0.6

## 2012-08-23 MED ORDER — GADOBENATE DIMEGLUMINE 529 MG/ML IV SOLN
18.0000 mL | Freq: Once | INTRAVENOUS | Status: AC | PRN
  Administered 2012-08-23: 18 mL via INTRAVENOUS

## 2012-08-23 NOTE — Telephone Encounter (Signed)
Called patient   Missed appointment for injection   Will come now

## 2012-08-27 ENCOUNTER — Other Ambulatory Visit: Payer: Self-pay | Admitting: Radiation Therapy

## 2012-08-27 DIAGNOSIS — C7949 Secondary malignant neoplasm of other parts of nervous system: Secondary | ICD-10-CM

## 2012-08-27 NOTE — Progress Notes (Signed)
[facility] [phone]   [phone]  OFFICE PROGRESS NOTE  Dorrene German, MD [address] [place] [address]  DIAGNOSIS: Metastatic non-small cell lung cancer, adenocarcinoma, diagnosed in June 2012.   PRIOR THERAPY:  1. Status post left Pleurx catheter placement by Dr. Edwyna Shell on February 08, 2011 for drainage of left sided pleural effusion. 2. Status post 4 cycles of induction chemotherapy with carboplatin for AUC of 6, paclitaxel 200 mg per meter square and Avastin 15 mg/kg given every 3 weeks according to the ECOG protocol 5508 with stable disease. 3. Status post 1 cycle of maintenance Avastin 15 mg/kg given on May 19, 2011, discontinued secondary to gastrointestinal bleed. 4. Maintenance chemotherapy with Alimta 500 mg/m2 every 3 weeks. Status post 9 cycles, discontinued today secondary to disease progression.   CURRENT THERAPY: systemic chemotherapy with docetaxel 75 mg/M2 every 3 weeks with Neulasta support. Status post 4 cycles.  INTERVAL HISTORY: LEDFORD GOODSON 78 y.o. male returns to the clinic today for followup visit. The patient is doing fine and tolerating his systemic chemotherapy with single agent docetaxel fairly well. He denied having any significant fever or chills, no nausea or vomiting. He denied having any significant chest pain, shortness breath, cough or hemoptysis. No significant peripheral neuropathy. He has noticed over the past 3 weeks that his eyes have been watering. He does report that it's been over a year since his last eye exam. He does plan to make an appointment to have an eye examination as he realizes this is important in the setting of his diabetes mellitus as well.   MEDICAL HISTORY: Past Medical History  Diagnosis Date  . Hypertension   . Diabetes mellitus   . Dyslipidemia   . lung ca 11/12/2010    LUL  . Mini stroke 2005  . Degenerative disc disease   . GERD (gastroesophageal reflux disease)     . Osteopenia 2005  . Coronary artery disease 2005    treated medically  . BPH (benign prostatic hyperplasia)   . DVT (deep venous thrombosis)   . History of chemotherapy   . Diverticulosis     per scan 05/28/12  . Calculus of kidney     per scan 05/28/12  . Hx of radiation therapy 06/14/12 -07/05/12    left chest    ALLERGIES:  is allergic to penicillins.  MEDICATIONS:  Current Outpatient Prescriptions  Medication Sig Dispense Refill  . acetaminophen (TYLENOL) 500 MG tablet Take 500 mg by mouth every 6 (six) hours as needed. For pain.      Marland Kitchen amLODipine-benazepril (LOTREL) 5-10 MG per capsule Take 1 capsule by mouth daily.        Marland Kitchen aspirin 81 MG tablet Take 81 mg by mouth daily.       Marland Kitchen dexamethasone (DECADRON) 4 MG tablet Take 4 mg by mouth 2 (two) times daily with a meal. 2 tabs by mouth BID, the day before, the day of and the day after chemotherapy      . glyBURIDE-metformin (GLUCOVANCE) 5-500 MG per tablet Take 2 tablets by mouth 2 (two) times daily.        Marland Kitchen HYDROcodone-acetaminophen (VICODIN) 5-500 MG per tablet Take 1-2 tablets by mouth every 6 (six) hours as needed. For pain.  60 tablet  0  . hydrOXYzine (ATARAX/VISTARIL) 25 MG tablet Take 1 tablet by mouth every 8 hours as needed for itching  30 tablet  0  . ibuprofen (ADVIL,MOTRIN) 200 MG  tablet Take 200 mg by mouth every 6 (six) hours as needed.      . lidocaine-prilocaine (EMLA) cream Apply 1 application topically as needed. Apply to [facility] A Cath site before chemotherapy.       Marland Kitchen morphine (MS CONTIN) 30 MG 12 hr tablet Take 1 tablet (30 mg total) by mouth 2 (two) times daily.  60 tablet  0  . pantoprazole (PROTONIX) 40 MG tablet Take 1 tablet (40 mg total) by mouth 2 (two) times daily.  30 tablet  3  . warfarin (COUMADIN) 5 MG tablet Take 7.5 mg by mouth daily. One and one-half tablets        SURGICAL HISTORY:  Past Surgical History  Procedure Date  . No past surgeries   . Pleurex cath 02/08/11    left-sided pleural  effusion    REVIEW OF SYSTEMS:  A comprehensive review of systems was negative except for: Constitutional: positive for fatigue Eyes: positive for Excessive tearing   PHYSICAL EXAMINATION: General appearance: alert, cooperative and no distress Head: Normocephalic, without obvious abnormality, atraumatic Neck: no adenopathy Lymph nodes: Cervical, supraclavicular, and axillary nodes normal. Resp: clear to auscultation bilaterally Back: symmetric, no curvature. ROM normal. No CVA tenderness. Cardio: regular rate and rhythm, S1, S2 normal, no murmur, click, rub or gallop GI: soft, non-tender; bowel sounds normal; no masses,  no organomegaly Extremities: extremities normal, atraumatic, no cyanosis or edema Neurologic: Alert and oriented X 3, normal strength and tone. Normal symmetric reflexes. Normal coordination and gait  ECOG PERFORMANCE STATUS: 1 - Symptomatic but completely ambulatory  Blood pressure 138/77, pulse 81, temperature 98.5 F (36.9 C), temperature source Oral, resp. rate 20, height 5\' 5"  (1.651 m), weight 188 lb 12.8 oz (85.639 kg).  LABORATORY DATA: Lab Results  Component Value Date   WBC 7.9 08/22/2012   HGB 11.0* 08/22/2012   HCT 35.6* 08/22/2012   MCV 94.2 08/22/2012   PLT 251 08/22/2012      Chemistry      Component Value Date/Time   NA 135* 08/22/2012 1012   NA 139 08/01/2012 1003   NA 139 11/07/2011 1621   K 4.7 08/22/2012 1012   K 4.1 08/01/2012 1003   K 4.8* 11/07/2011 1621   CL 104 08/22/2012 1012   CL 106 08/01/2012 1003   CL 97* 11/07/2011 1621   CO2 23 08/22/2012 1012   CO2 26 08/01/2012 1003   CO2 30 11/07/2011 1621   BUN 13.0 08/22/2012 1012   BUN 11 08/01/2012 1003   BUN 11 11/07/2011 1621   CREATININE 0.8 08/22/2012 1012   CREATININE 0.85 08/01/2012 1003   CREATININE 0.9 11/07/2011 1621      Component Value Date/Time   CALCIUM 8.4 08/01/2012 1003   CALCIUM 9.4 11/07/2011 1621   ALKPHOS 90 08/22/2012 1012   ALKPHOS 80 08/01/2012 1003   ALKPHOS 100* 11/07/2011  1621   AST 14 08/22/2012 1012   AST 14 08/01/2012 1003   AST 21 11/07/2011 1621   ALT 17 08/22/2012 1012   ALT 11 08/01/2012 1003   BILITOT 0.40 08/22/2012 1012   BILITOT 0.4 08/01/2012 1003   BILITOT 0.50 11/07/2011 1621       RADIOGRAPHIC STUDIES: Ct Chest W Contrast  08/01/2012  *RADIOLOGY REPORT*  Clinical Data:  Lung cancer.  Ongoing chemotherapy.  Radiation therapy completed June 25, 2012.  Shortness of breath.  History of DVT.  CT CHEST, ABDOMEN AND PELVIS WITH CONTRAST  Technique:  Multidetector CT imaging of the chest,  abdomen and pelvis was performed following the standard protocol during bolus administration of intravenous contrast.  Contrast: OMNIPAQUE IOHEXOL 300 MG/ML  SOLN  Comparison:  05/28/2012.   CT CHEST  Findings:  Prevascular lymph node measures 1.4 cm (previously 0.7 cm).  Lymph node anterior to the left pulmonary artery measures 1.3 cm (previously 1.9 cm).  Anterior mediastinal lymph node measures 8 mm in short axis (previously 10 mm).  No axillary adenopathy. Atherosclerotic calcification of the arterial vasculature, including extensive involvement of the coronary arteries.  Heart is at the upper limits of normal in size.  No pericardial effusion.  Small loculated left pleural effusion, slightly decreased in size from 05/28/2012.  High attenuation material along the inferior left pleural margin is indicative of talc pleurodesis. Persistent rounded consolidation in the inferior left lower lobe. Improving pleural nodularity in the left hemithorax. Residual soft tissue thickening is seen in the medial aspect of the left upper lobe, along the mediastinal margin, measuring approximately 0.7 x 2.4 cm (previously 1.8 x 3.3 cm).  Emphysema. Patchy ground-glass in the posterior segment right upper lobe is nonspecific.  Mild volume loss in the right lower lobe.  No right pleural effusion.  Airway is otherwise unremarkable.  IMPRESSION:  1.  Overall response to therapy as evidenced by  decrease in size of the left upper lobe mass, decrease in size involving the majority of mediastinal adenopathy, improving left pleural nodularity and slight decrease in size of a loculated left pleural effusion. Rounded consolidation in the left lower lobe is unchanged. 2.  Prevascular lymph node measures slightly larger.  Continued attention on follow-up exams is warranted.   CT ABDOMEN AND PELVIS  Findings:  Low attenuation lesions in the liver measure up to 2.1 cm in the left hepatic lobe, as before.  Liver, gallbladder and adrenal glands are otherwise unremarkable.  Low attenuation lesions in the kidneys measure up to 4.3 cm on the right, stable. Right renal stone are Kidneys, spleen, pancreas, stomach and bowel are otherwise unremarkable.  Atherosclerotic calcification of the arterial vasculature without abdominal aortic aneurysm.  IVC filter is in place.  No pathologically enlarged lymph nodes.  No free fluid.  No worrisome lytic or sclerotic lesions.  Degenerative changes are seen in the spine.  IMPRESSION:  1.  No evidence of metastatic disease in the abdomen or pelvis. 2.  Nonobstructing right renal stone.  Original Report Authenticated By: Johnathan Bowman, M.D.     ASSESSMENT/PLAN: This is a very pleasant 78 years old [place] American male with metastatic non-small cell lung cancer currently on systemic chemotherapy with single agent docetaxel status post 4 cycles with improvement in his disease. Patient was discussed with Dr. Arbutus Ped. He will continue with his systemic chemotherapy in the form of docetaxel 75 mg per meter squared given every 3 weeks with Neulasta support. He is encouraged to schedule and keep an appointment for an eye examination. Some of the excessive tearing may be related to his chemotherapy. He'll return in 3 weeks prior to cycle #6 of his systemic chemotherapy with a repeat CBC differential and C. met. He will continue with weekly labs consisting of a CBC differential and C.  met.  Laural Benes, Demaria Deeney E, PA-C   All questions were answered. The patient knows to call the clinic with any problems, questions or concerns. We can certainly see the patient much sooner if necessary.  I spent 20 minutes counseling the patient face to face. The total time spent in the appointment was  30 minutes.

## 2012-08-28 ENCOUNTER — Telehealth: Payer: Self-pay | Admitting: Internal Medicine

## 2012-08-28 NOTE — Telephone Encounter (Signed)
Added f/u, chemo and wkly lb appts. lmonvm for pt re appt for 9/11 and pt to get schedule when he comes in. Pt asked to call me if he has any questions or can't come 9/11.

## 2012-08-29 ENCOUNTER — Other Ambulatory Visit: Payer: Self-pay | Admitting: Lab

## 2012-08-29 ENCOUNTER — Other Ambulatory Visit (HOSPITAL_BASED_OUTPATIENT_CLINIC_OR_DEPARTMENT_OTHER): Payer: Medicare Other | Admitting: Lab

## 2012-08-29 DIAGNOSIS — C3490 Malignant neoplasm of unspecified part of unspecified bronchus or lung: Secondary | ICD-10-CM

## 2012-08-29 LAB — CBC WITH DIFFERENTIAL/PLATELET
EOS%: 1.1 % (ref 0.0–7.0)
Eosinophils Absolute: 0.1 10*3/uL (ref 0.0–0.5)
LYMPH%: 14 % (ref 14.0–49.0)
MCH: 30 pg (ref 27.2–33.4)
MCV: 94.5 fL (ref 79.3–98.0)
MONO%: 14.7 % — ABNORMAL HIGH (ref 0.0–14.0)
Platelets: 219 10*3/uL (ref 140–400)
RBC: 3.55 10*6/uL — ABNORMAL LOW (ref 4.20–5.82)
RDW: 18.4 % — ABNORMAL HIGH (ref 11.0–14.6)

## 2012-08-29 LAB — COMPREHENSIVE METABOLIC PANEL (CC13)
AST: 13 U/L (ref 5–34)
Albumin: 3.1 g/dL — ABNORMAL LOW (ref 3.5–5.0)
Alkaline Phosphatase: 99 U/L (ref 40–150)
BUN: 13 mg/dL (ref 7.0–26.0)
Glucose: 200 mg/dl — ABNORMAL HIGH (ref 70–99)
Potassium: 4.6 mEq/L (ref 3.5–5.1)
Sodium: 137 mEq/L (ref 136–145)
Total Bilirubin: 0.3 mg/dL (ref 0.20–1.20)
Total Protein: 5.9 g/dL — ABNORMAL LOW (ref 6.4–8.3)

## 2012-09-05 ENCOUNTER — Telehealth: Payer: Self-pay | Admitting: Internal Medicine

## 2012-09-05 ENCOUNTER — Other Ambulatory Visit: Payer: Medicare Other | Admitting: Lab

## 2012-09-05 NOTE — Telephone Encounter (Signed)
Gave pt appt for September and October 2013, appt from 9/18 r/s to 9/19 per pt

## 2012-09-06 ENCOUNTER — Other Ambulatory Visit (HOSPITAL_BASED_OUTPATIENT_CLINIC_OR_DEPARTMENT_OTHER): Payer: Medicare Other | Admitting: Lab

## 2012-09-06 DIAGNOSIS — C3410 Malignant neoplasm of upper lobe, unspecified bronchus or lung: Secondary | ICD-10-CM

## 2012-09-06 DIAGNOSIS — C3490 Malignant neoplasm of unspecified part of unspecified bronchus or lung: Secondary | ICD-10-CM

## 2012-09-06 LAB — COMPREHENSIVE METABOLIC PANEL (CC13)
Alkaline Phosphatase: 87 U/L (ref 40–150)
CO2: 24 mEq/L (ref 22–29)
Creatinine: 0.9 mg/dL (ref 0.7–1.3)
Glucose: 232 mg/dl — ABNORMAL HIGH (ref 70–99)
Sodium: 138 mEq/L (ref 136–145)
Total Bilirubin: 0.4 mg/dL (ref 0.20–1.20)

## 2012-09-06 LAB — CBC WITH DIFFERENTIAL/PLATELET
Eosinophils Absolute: 0 10*3/uL (ref 0.0–0.5)
HCT: 31 % — ABNORMAL LOW (ref 38.4–49.9)
LYMPH%: 5.1 % — ABNORMAL LOW (ref 14.0–49.0)
MCHC: 32.7 g/dL (ref 32.0–36.0)
MCV: 92.4 fL (ref 79.3–98.0)
MONO#: 0.7 10*3/uL (ref 0.1–0.9)
MONO%: 7 % (ref 0.0–14.0)
NEUT#: 8.4 10*3/uL — ABNORMAL HIGH (ref 1.5–6.5)
NEUT%: 87.3 % — ABNORMAL HIGH (ref 39.0–75.0)
Platelets: 214 10*3/uL (ref 140–400)
RBC: 3.36 10*6/uL — ABNORMAL LOW (ref 4.20–5.82)
WBC: 9.6 10*3/uL (ref 4.0–10.3)

## 2012-09-10 ENCOUNTER — Telehealth: Payer: Self-pay | Admitting: *Deleted

## 2012-09-10 ENCOUNTER — Emergency Department (HOSPITAL_COMMUNITY): Payer: Medicare Other

## 2012-09-10 ENCOUNTER — Encounter (HOSPITAL_COMMUNITY): Payer: Self-pay

## 2012-09-10 ENCOUNTER — Inpatient Hospital Stay (HOSPITAL_COMMUNITY)
Admission: EM | Admit: 2012-09-10 | Discharge: 2012-09-15 | DRG: 695 | Disposition: A | Discharge status: Home / Self Care | Payer: Medicare Other | Attending: Internal Medicine | Admitting: Internal Medicine

## 2012-09-10 DIAGNOSIS — D62 Acute posthemorrhagic anemia: Secondary | ICD-10-CM | POA: Diagnosis present

## 2012-09-10 DIAGNOSIS — I82409 Acute embolism and thrombosis of unspecified deep veins of unspecified lower extremity: Secondary | ICD-10-CM

## 2012-09-10 DIAGNOSIS — D6859 Other primary thrombophilia: Secondary | ICD-10-CM | POA: Diagnosis present

## 2012-09-10 DIAGNOSIS — I10 Essential (primary) hypertension: Secondary | ICD-10-CM | POA: Diagnosis present

## 2012-09-10 DIAGNOSIS — R600 Localized edema: Secondary | ICD-10-CM | POA: Diagnosis present

## 2012-09-10 DIAGNOSIS — E162 Hypoglycemia, unspecified: Secondary | ICD-10-CM | POA: Diagnosis present

## 2012-09-10 DIAGNOSIS — N390 Urinary tract infection, site not specified: Secondary | ICD-10-CM | POA: Diagnosis present

## 2012-09-10 DIAGNOSIS — Z8673 Personal history of transient ischemic attack (TIA), and cerebral infarction without residual deficits: Secondary | ICD-10-CM

## 2012-09-10 DIAGNOSIS — R05 Cough: Secondary | ICD-10-CM

## 2012-09-10 DIAGNOSIS — C3490 Malignant neoplasm of unspecified part of unspecified bronchus or lung: Secondary | ICD-10-CM | POA: Diagnosis present

## 2012-09-10 DIAGNOSIS — C801 Malignant (primary) neoplasm, unspecified: Secondary | ICD-10-CM

## 2012-09-10 DIAGNOSIS — J984 Other disorders of lung: Secondary | ICD-10-CM

## 2012-09-10 DIAGNOSIS — Z923 Personal history of irradiation: Secondary | ICD-10-CM

## 2012-09-10 DIAGNOSIS — Z79899 Other long term (current) drug therapy: Secondary | ICD-10-CM

## 2012-09-10 DIAGNOSIS — E119 Type 2 diabetes mellitus without complications: Secondary | ICD-10-CM | POA: Diagnosis present

## 2012-09-10 DIAGNOSIS — Z86718 Personal history of other venous thrombosis and embolism: Secondary | ICD-10-CM

## 2012-09-10 DIAGNOSIS — K219 Gastro-esophageal reflux disease without esophagitis: Secondary | ICD-10-CM | POA: Diagnosis present

## 2012-09-10 DIAGNOSIS — R0902 Hypoxemia: Secondary | ICD-10-CM | POA: Diagnosis present

## 2012-09-10 DIAGNOSIS — M899 Disorder of bone, unspecified: Secondary | ICD-10-CM | POA: Diagnosis present

## 2012-09-10 DIAGNOSIS — N400 Enlarged prostate without lower urinary tract symptoms: Secondary | ICD-10-CM | POA: Diagnosis present

## 2012-09-10 DIAGNOSIS — R319 Hematuria, unspecified: Principal | ICD-10-CM | POA: Diagnosis present

## 2012-09-10 DIAGNOSIS — Z87891 Personal history of nicotine dependence: Secondary | ICD-10-CM

## 2012-09-10 DIAGNOSIS — J90 Pleural effusion, not elsewhere classified: Secondary | ICD-10-CM

## 2012-09-10 DIAGNOSIS — J189 Pneumonia, unspecified organism: Secondary | ICD-10-CM | POA: Diagnosis present

## 2012-09-10 DIAGNOSIS — I2510 Atherosclerotic heart disease of native coronary artery without angina pectoris: Secondary | ICD-10-CM | POA: Diagnosis present

## 2012-09-10 DIAGNOSIS — T45515A Adverse effect of anticoagulants, initial encounter: Secondary | ICD-10-CM | POA: Diagnosis present

## 2012-09-10 DIAGNOSIS — E785 Hyperlipidemia, unspecified: Secondary | ICD-10-CM | POA: Diagnosis present

## 2012-09-10 DIAGNOSIS — M949 Disorder of cartilage, unspecified: Secondary | ICD-10-CM | POA: Diagnosis present

## 2012-09-10 DIAGNOSIS — R791 Abnormal coagulation profile: Secondary | ICD-10-CM | POA: Diagnosis present

## 2012-09-10 LAB — CBC
HCT: 32.9 % — ABNORMAL LOW (ref 39.0–52.0)
MCHC: 30.7 g/dL (ref 30.0–36.0)
MCV: 94.5 fL (ref 78.0–100.0)
Platelets: 315 10*3/uL (ref 150–400)
RDW: 17.3 % — ABNORMAL HIGH (ref 11.5–15.5)

## 2012-09-10 LAB — COMPREHENSIVE METABOLIC PANEL
ALT: 9 U/L (ref 0–53)
Alkaline Phosphatase: 75 U/L (ref 39–117)
BUN: 12 mg/dL (ref 6–23)
Creatinine, Ser: 0.88 mg/dL (ref 0.50–1.35)
GFR calc Af Amer: >90 mL/min (ref >90)
GFR calc non Af Amer: 79 mL/min — ABNORMAL LOW (ref >90)
Total Bilirubin: 0.2 mg/dL — ABNORMAL LOW (ref 0.3–1.2)
Total Protein: 6.2 g/dL (ref 6.0–8.3)

## 2012-09-10 LAB — URINALYSIS, ROUTINE W REFLEX MICROSCOPIC
Glucose, UA: NEGATIVE mg/dL
Specific Gravity, Urine: 1.028 (ref 1.005–1.030)
pH: 5 (ref 5.0–8.0)

## 2012-09-10 LAB — GLUCOSE, CAPILLARY
Glucose-Capillary: 50 mg/dL — ABNORMAL LOW (ref 70–99)
Glucose-Capillary: 68 mg/dL — ABNORMAL LOW (ref 70–99)

## 2012-09-10 MED ORDER — SODIUM CHLORIDE 0.9 % IV SOLN
INTRAVENOUS | Status: DC
  Administered 2012-09-10: 30 mL via INTRAVENOUS
  Administered 2012-09-11: 20 mL/h via INTRAVENOUS

## 2012-09-10 MED ORDER — ONDANSETRON HCL 4 MG/2ML IJ SOLN: 4.0000 mg | Freq: Four times a day (QID) | INTRAMUSCULAR | Status: DC | PRN

## 2012-09-10 MED ORDER — SODIUM CHLORIDE 0.9 % IJ SOLN: 3.0000 mL | INTRAMUSCULAR | Status: DC | PRN

## 2012-09-10 MED ORDER — BENAZEPRIL HCL 10 MG PO TABS
10.0000 mg | ORAL_TABLET | Freq: Every day | ORAL | Status: DC
  Administered 2012-09-10 – 2012-09-15 (×6): 10 mg via ORAL
  Filled 2012-09-10 (×6): qty 1

## 2012-09-10 MED ORDER — ALBUTEROL SULFATE (5 MG/ML) 0.5% IN NEBU
2.5000 mg | INHALATION_SOLUTION | RESPIRATORY_TRACT | Status: DC | PRN
  Administered 2012-09-15: 2.5 mg via RESPIRATORY_TRACT
  Filled 2012-09-10: qty 0.5

## 2012-09-10 MED ORDER — INSULIN ASPART 100 UNIT/ML ~~LOC~~ SOLN: 0.0000 [IU] | Freq: Three times a day (TID) | SUBCUTANEOUS | Status: DC

## 2012-09-10 MED ORDER — PANTOPRAZOLE SODIUM 40 MG PO TBEC
40.0000 mg | DELAYED_RELEASE_TABLET | Freq: Two times a day (BID) | ORAL | Status: DC
  Administered 2012-09-10 – 2012-09-15 (×10): 40 mg via ORAL
  Filled 2012-09-10 (×11): qty 1

## 2012-09-10 MED ORDER — IPRATROPIUM BROMIDE 0.02 % IN SOLN
0.5000 mg | Freq: Once | RESPIRATORY_TRACT | Status: AC
  Administered 2012-09-10: 0.5 mg via RESPIRATORY_TRACT
  Filled 2012-09-10: qty 2.5

## 2012-09-10 MED ORDER — ONDANSETRON HCL 4 MG PO TABS: 4.0000 mg | ORAL_TABLET | Freq: Four times a day (QID) | ORAL | Status: DC | PRN

## 2012-09-10 MED ORDER — INSULIN ASPART 100 UNIT/ML ~~LOC~~ SOLN: 0.0000 [IU] | Freq: Every day | SUBCUTANEOUS | Status: DC

## 2012-09-10 MED ORDER — AMLODIPINE BESYLATE 5 MG PO TABS
5.0000 mg | ORAL_TABLET | Freq: Every day | ORAL | Status: DC
  Administered 2012-09-10 – 2012-09-15 (×6): 5 mg via ORAL
  Filled 2012-09-10 (×6): qty 1

## 2012-09-10 MED ORDER — PHYTONADIONE 5 MG PO TABS
5.0000 mg | ORAL_TABLET | Freq: Once | ORAL | Status: DC
  Filled 2012-09-10 (×2): qty 1

## 2012-09-10 MED ORDER — ALBUTEROL SULFATE (5 MG/ML) 0.5% IN NEBU
5.0000 mg | INHALATION_SOLUTION | Freq: Once | RESPIRATORY_TRACT | Status: AC
  Administered 2012-09-10: 5 mg via RESPIRATORY_TRACT
  Filled 2012-09-10: qty 1

## 2012-09-10 MED ORDER — AZITHROMYCIN 500 MG IV SOLR
500.0000 mg | INTRAVENOUS | Status: DC
  Administered 2012-09-10 – 2012-09-13 (×4): 500 mg via INTRAVENOUS
  Filled 2012-09-10 (×5): qty 500

## 2012-09-10 MED ORDER — LIDOCAINE-PRILOCAINE 2.5-2.5 % EX CREA
1.0000 "application " | TOPICAL_CREAM | CUTANEOUS | Status: DC | PRN
  Filled 2012-09-10: qty 5

## 2012-09-10 MED ORDER — HYDROXYZINE HCL 25 MG PO TABS
25.0000 mg | ORAL_TABLET | Freq: Three times a day (TID) | ORAL | Status: DC | PRN
  Filled 2012-09-10: qty 1

## 2012-09-10 MED ORDER — AMLODIPINE BESY-BENAZEPRIL HCL 5-10 MG PO CAPS: 1.0000 | ORAL_CAPSULE | Freq: Every day | ORAL | Status: DC

## 2012-09-10 MED ORDER — SODIUM CHLORIDE 0.9 % IJ SOLN: 3.0000 mL | Freq: Two times a day (BID) | INTRAMUSCULAR | Status: DC

## 2012-09-10 MED ORDER — DEXTROSE 5 % IV SOLN
1.0000 g | INTRAVENOUS | Status: DC
  Administered 2012-09-10 – 2012-09-15 (×6): 1 g via INTRAVENOUS
  Filled 2012-09-10 (×6): qty 10

## 2012-09-10 MED ORDER — MORPHINE SULFATE ER 30 MG PO TBCR
30.0000 mg | EXTENDED_RELEASE_TABLET | Freq: Two times a day (BID) | ORAL | Status: DC
  Administered 2012-09-10 – 2012-09-14 (×8): 30 mg via ORAL
  Filled 2012-09-10 (×9): qty 1

## 2012-09-10 MED ORDER — SODIUM CHLORIDE 0.9 % IV SOLN: 250.0000 mL | INTRAVENOUS | Status: DC | PRN

## 2012-09-10 MED ORDER — HYDROCODONE-ACETAMINOPHEN 5-325 MG PO TABS: 1.0000 | ORAL_TABLET | Freq: Four times a day (QID) | ORAL | Status: DC | PRN

## 2012-09-10 NOTE — Progress Notes (Signed)
Patient admitted to 5E transferred from ED, alert and oriented, transferred on hospital bed by [facility] Nurse, patient tolerated well, 2L/[place] sats => 96%, skin warm and dry to touch, pupil active with small yellow area on right pupil, voiding yellow bloody dark red, without pain, no pain on scale 1-10, patient is ad lib, no wounds or skin issues noted, patient in stable condition at this time

## 2012-09-10 NOTE — Telephone Encounter (Signed)
THERE WAS A LARGE AMOUNT OF BLOOD IN  THE TOILET. THIS MORNING THERE WAS A SMALL AMOUNT OF BLOOD IN THE TOILET TIMES TWO. PT. IS HAVING FREQUENCY AND PAIN IN HIS BACK AT A SCALE OF FIVE. HE ALSO FELL A WEEK AGO AND HIS LEFT LEG IS NOW SWOLLEN. VERBAL ORDER AND READ BACK TO DR.MOHAMED- PT. NEEDS TO GO TO THE EMERGENCY ROOM TO BE EVALUATED. WALKED PT. TO THE EMERGENCY ROOM.

## 2012-09-10 NOTE — H&P (Signed)
[facility] History and Physical  Johnathan Bowman JWJ:[id number] DOB: June 10, 1929 DOA: 09/10/2012  Referring physician: er PCP: Dorrene German, MD   Chief Complaint: hematuria  HPI: Johnathan Bowman is a 78 y.o. male  Who was taken off coumadin for DVT recently.  Comes in c/o hematuria.  He started this AM wit blood in urine and clots.  No bloody stool now but has had in past.  He also c/o SOB.   No CP, no fever + cough +occasional back pain +chills  Work up in ED yields chest xray with worsening left basilar opacity which may reflect atelectasis due to low lung volumes superimposed on chronic opacity, but cannot exclude pneumonia. Small left pleural effusion. INR 4.8. Urine with nitirite, leukocyte, 11-20 WBC, too numerous to count RBC's, many bacteria. Sats 87% at rest on room air.  In ED given zithromax, rocephin, nebs.    Review of Systems: all systems reviewed, negative unless stated above  Past Medical History  Diagnosis Date  . Hypertension   . Diabetes mellitus   . Dyslipidemia   . Mini stroke 2005  . Degenerative disc disease   . GERD (gastroesophageal reflux disease)   . Osteopenia 2005  . Coronary artery disease 2005    treated medically  . BPH (benign prostatic hyperplasia)   . DVT (deep venous thrombosis)   . History of chemotherapy   . Diverticulosis     per scan 05/28/12  . Calculus of kidney     per scan 05/28/12  . Hx of radiation therapy 06/14/12 -07/05/12    left chest  . lung ca 11/12/2010    LUL   Past Surgical History  Procedure Date  . No past surgeries   . Pleurex cath 02/08/11    left-sided pleural effusion   Social History:  reports that he quit smoking about 26 years ago. He has never used smokeless tobacco. He reports that he does not drink alcohol or use illicit drugs. From home, lives with wife  Allergies  Allergen Reactions  . Penicillins Other (See Comments)    Unknown childhood allergy     family Hx: +HTN  Prior to Admission  medications   Medication Sig Start Date End Date Taking? Authorizing Provider  amLODipine-benazepril (LOTREL) 5-10 MG per capsule Take 1 capsule by mouth daily.     Yes Historical Provider, MD  aspirin 81 MG tablet Take 81 mg by mouth daily.    Yes Historical Provider, MD  dexamethasone (DECADRON) 4 MG tablet Take 4 mg by mouth 2 (two) times daily with a meal. 2 tabs by mouth BID, the day before, the day of and the day after chemotherapy   Yes Historical Provider, MD  glyBURIDE-metformin (GLUCOVANCE) 5-500 MG per tablet Take 2 tablets by mouth 2 (two) times daily.     Yes Historical Provider, MD  HYDROcodone-acetaminophen (VICODIN) 5-500 MG per tablet Take 1-2 tablets by mouth every 6 (six) hours as needed. For pain. 07/02/12  Yes Lonie Peak, MD  hydrOXYzine (ATARAX/VISTARIL) 25 MG tablet Take 1 tablet by mouth every 8 hours as needed for itching 07/11/12  Yes Conni Slipper, PA  lidocaine-prilocaine (EMLA) cream Apply 1 application topically as needed. Apply to [facility] A Cath site before chemotherapy.  02/14/11  Yes Si Gaul, MD  morphine (MS CONTIN) 30 MG 12 hr tablet Take 1 tablet (30 mg total) by mouth 2 (two) times daily. 05/30/12  Yes Si Gaul, MD  pantoprazole (PROTONIX) 40 MG tablet Take 1  tablet (40 mg total) by mouth 2 (two) times daily. 02/15/12  Yes Conni Slipper, PA  PRESCRIPTION MEDICATION Chemo=last dose 2 weeks ago. Next dose in 1 week. Dr Tanja Port   Yes Historical Provider, MD  acetaminophen (TYLENOL) 500 MG tablet Take 500 mg by mouth every 6 (six) hours as needed. For pain.    Historical Provider, MD   Physical Exam: Filed Vitals:   09/10/12 1109 09/10/12 1111 09/10/12 1249 09/10/12 1635  BP: 122/71   155/84  Pulse: 111   113  Temp: 98.4 F (36.9 C)   98.5 F (36.9 C)  TempSrc: Oral   Oral  Resp: 22   21  Height:    5\' 5"  (1.651 m)  Weight:    85.276 kg (188 lb)  SpO2: 92% 98% 93% 90%     General:  A+Ox3, NAD- hard of hearing  Eyes: wnl  ENT:  wnl  Neck: supple no JVD  Cardiovascular: rrr, no murmur  Respiratory: clear anterior  Abdomen: +BS, firm, NT/ND, mild CVA tend  Skin: no rashes or lesions, no burising noted  Musculoskeletal: moves all 4 extremities equally  Psychiatric: no SI/no HI  Neurologic: no focal deficits  Ext: left leg more swollen than right, +tenderness  Labs on Admission:  Basic Metabolic Panel:  Lab 09/10/12 1610 09/06/12 1002  NA 137 138  K 4.0 4.6  CL 103 103  CO2 25 24  GLUCOSE 163* 232*  BUN 12 8.0  CREATININE 0.88 0.9  CALCIUM 8.6 8.3*  MG -- --  PHOS -- --   Liver Function Tests:  Lab 09/10/12 1230 09/06/12 1002  AST 14 15  ALT 9 12  ALKPHOS 75 87  BILITOT 0.2* 0.40  PROT 6.2 5.6*  ALBUMIN 2.7* 2.8*   No results found for this basename: LIPASE:5,AMYLASE:5 in the last 168 hours No results found for this basename: AMMONIA:5 in the last 168 hours CBC:  Lab 09/10/12 1230 09/06/12 1002  WBC 9.0 9.6  NEUTROABS -- 8.4*  HGB 10.1* 10.2*  HCT 32.9* 31.0*  MCV 94.5 92.4  PLT 315 214   Cardiac Enzymes:  Lab 09/10/12 1230  CKTOTAL --  CKMB --  CKMBINDEX --  TROPONINI <0.30    BNP (last 3 results)  Basename 09/10/12 1230 01/18/12 1020  PROBNP 125.6 68.7   CBG: No results found for this basename: GLUCAP:5 in the last 168 hours  Radiological Exams on Admission: Dg Chest 2 View  09/10/2012  *RADIOLOGY REPORT*  Clinical Data: Cough, shortness of breath.  CHEST - 2 VIEW  Comparison: 01/18/2012  Findings: Right Port-A-Cath remains in place, unchanged.  Low lung volumes.  Increasing left basilar opacity concerning for pneumonia. This may represent atelectasis superimposed on chronic changes seen on prior CT.  Cannot exclude pneumonia.  Recommend clinical correlation. Heart is normal size.  There are low lung volumes.  No right effusion.  No acute bony abnormality.  IMPRESSION: Worsening left basilar opacity which may reflect atelectasis due to low lung volumes superimposed  on chronic opacity, but cannot exclude pneumonia.  Recommend clinical correlation.  Small left pleural effusion.   Original Report Authenticated By: Cyndie Chime, M.D.       Assessment/Plan Principal Problem:  *Hematuria Active Problems:  ADENOCARCIMONA, LUNG, NONSMALL CELL  Hx of radiation therapy  CAP (community acquired pneumonia)  Hypoxia  Hypercoagulopathy  UTI (lower urinary tract infection)   1. Hematuria- ?etiology, Hgb stable at 10, ?UTI vs inc INR vs bladder CA- patient has a  history of smoking- could use outpatient cystoscopy if not seem by urology inpatient.  Continue to monitor 2. UTI- rocephin day #1, culture pending 3. CAP- rocephin/azithrom, check pro calcitonin 4. Hypoxia- O2 as needed 5. Inc INR- says he was taken off coumadin weeks ago but INR high, suspect he may still be taking mistakenly 6. ?DVT- had one in Left leg recently, recheck duplex as cancer patient and prone to thrombosis 7. Lung CA- consult Dr. Arbutus Ped in AM  Code Status: full Family Communication: patient at bedside Disposition Plan: home when better  Time spent: > 70 min  Kaiyah Eber [facility] Pager [phone]  If 7PM-7AM, please contact night-coverage [email or web address] Password [facility] 09/10/2012, 5:09 PM

## 2012-09-10 NOTE — ED Notes (Addendum)
Pt c/o hematuria yesterday describe it as dark red color. No Dysuria, no pain.  Also complaint of shortness of breath x 2 weeks only on exertion, sat 89% RA, pt placed on 2 L [place] with improving sat of 96%

## 2012-09-10 NOTE — Progress Notes (Signed)
Johnathan Bowman, is a 78 y.o. male,   MRN: [id number]  -  DOB - 1929/06/28  Outpatient Primary MD for the patient is Dorrene German, MD  in for    Chief Complaint  Patient presents with  . Hematuria  . Shortness of Breath     Blood pressure 122/71, pulse 111, temperature 98.4 F (36.9 C), temperature source Oral, resp. rate 22, SpO2 93.00%.  Principal Problem:  *Hypoxia Active Problems:  ADENOCARCIMONA, LUNG, NONSMALL CELL  Hx of radiation therapy  CAP (community acquired pneumonia)  Hypercoagulopathy  UTI (lower urinary tract infection)  Hematuria  78 yo male hx htn, DM, DVT, lung ca and hx radiation therapy presents to ED cc SOB and hematuria. Pt reports not on coumadin now but has been in past. Denies melena or any other unusual bleeding. No syncope, near syncope, No pain. Also with cough that is dry and sob. Taking chemo last treatment 3 weeks ago.   Work up in ED yields chest xray with worsening left basilar opacity which may reflect atelectasis due to low lung volumes superimposed on chronic opacity, but cannot exclude pneumonia. Small left pleural effusion. INR 4.8. Urine with nitirite, leukocyte, 11-20 WBC, too numerous to count RBC's, many bacteria. Sats 87% at rest on room air.   In ED given zithromax, rocephin, nebs.   On exam, sitting on side of bed. Mild increased work of breathing with conversation. BS with good air movement. Slightly tachycardic. NAD Will admit to tele.

## 2012-09-10 NOTE — ED Notes (Signed)
Report called to nurse Little

## 2012-09-10 NOTE — Progress Notes (Signed)
Hypoglycemic Event  CBG: 65  Treatment: 4oz juice  Symptoms: none  Follow-up CBG: Time:2145 CBG Result:68  Possible Reasons for Event: unknown  Comments/MD notified:    Bryant-McLaughlin, Johna Roles  Remember to initiate Hypoglycemia Order Set & complete

## 2012-09-10 NOTE — ED Notes (Signed)
RT called for Tx

## 2012-09-10 NOTE — ED Notes (Signed)
Pt remains in triage room because he need oxygen, sat dropped to 89% on RA, pt however in no acute distress. Pending for a room

## 2012-09-10 NOTE — ED Notes (Signed)
MD at bedside. 

## 2012-09-10 NOTE — ED Provider Notes (Addendum)
History     CSN: [id number]  Arrival date & time 09/10/12  1100   First MD Initiated Contact with Patient 09/10/12 1135      Chief Complaint  Patient presents with  . Hematuria  . Shortness of Breath    (Consider location/radiation/quality/duration/timing/severity/associated sxs/prior treatment) Patient is a 78 y.o. male presenting with hematuria and shortness of breath. The history is provided by the patient.  Hematuria Pertinent negatives include no abdominal pain, chills, fever or flank pain.  Shortness of Breath  Associated symptoms include cough and shortness of breath. Pertinent negatives include no chest pain and no fever.  pt with hx metastatic lung ca, dvt, presents w hematuria. States two episodes at home of blood in urine. Pt states not on coumadin currently, although records indicate pt is on coumadin - pt states stopped a month ago, but states is unsure why or who told him to stop. Pt denies any blood in stools, nosebleeds, or other abn bleeding or bruising.  No faintness or lightheadedness. States feels is able to empty bladder fully. No abd or flank pain. No fever or chills. Denies hx same. No specific exacerbating or alleviating factors. Also notes non productive cough in past week. w coughing spell mild sob. Denies chest pain. No pleuritic pain. States mild chronic bilateral leg edema, left greater than right, states is at baseline. States gets chemo q 3 weeks, states last chemo was about 2-3 weeks ago.     Past Medical History  Diagnosis Date  . Hypertension   . Diabetes mellitus   . Dyslipidemia   . lung ca 11/12/2010    LUL  . Mini stroke 2005  . Degenerative disc disease   . GERD (gastroesophageal reflux disease)   . Osteopenia 2005  . Coronary artery disease 2005    treated medically  . BPH (benign prostatic hyperplasia)   . DVT (deep venous thrombosis)   . History of chemotherapy   . Diverticulosis     per scan 05/28/12  . Calculus of kidney     per  scan 05/28/12  . Hx of radiation therapy 06/14/12 -07/05/12    left chest    Past Surgical History  Procedure Date  . No past surgeries   . Pleurex cath 02/08/11    left-sided pleural effusion    No family history on file.  History  Substance Use Topics  . Smoking status: Former Smoker -- 3.0 packs/day for 25 years    Quit date: 01/17/1986  . Smokeless tobacco: Never Used  . Alcohol Use: No      Review of Systems  Constitutional: Negative for fever and chills.  HENT: Negative for neck pain.   Eyes: Negative for redness.  Respiratory: Positive for cough and shortness of breath.   Cardiovascular: Negative for chest pain and palpitations.  Gastrointestinal: Negative for abdominal pain.  Genitourinary: Positive for hematuria. Negative for flank pain.  Musculoskeletal: Negative for back pain.  Skin: Negative for rash.  Neurological: Negative for headaches.  Hematological: Does not bruise/bleed easily.  Psychiatric/Behavioral: Negative for confusion.    Allergies  Penicillins  Home Medications   Current Outpatient Rx  Name Route Sig Dispense Refill  . ACETAMINOPHEN 500 MG PO TABS Oral Take 500 mg by mouth every 6 (six) hours as needed. For pain.    Marland Kitchen AMLODIPINE BESY-BENAZEPRIL HCL 5-10 MG PO CAPS Oral Take 1 capsule by mouth daily.      . ASPIRIN 81 MG PO TABS Oral Take 81 mg by  mouth daily.     Marland Kitchen DEXAMETHASONE 4 MG PO TABS Oral Take 4 mg by mouth 2 (two) times daily with a meal. 2 tabs by mouth BID, the day before, the day of and the day after chemotherapy    . GLYBURIDE-METFORMIN 5-500 MG PO TABS Oral Take 2 tablets by mouth 2 (two) times daily.      Marland Kitchen HYDROCODONE-ACETAMINOPHEN 5-500 MG PO TABS Oral Take 1-2 tablets by mouth every 6 (six) hours as needed. For pain. 60 tablet 0  . HYDROXYZINE HCL 25 MG PO TABS  Take 1 tablet by mouth every 8 hours as needed for itching 30 tablet 0  . IBUPROFEN 200 MG PO TABS Oral Take 200 mg by mouth every 6 (six) hours as needed.    Marland Kitchen  LIDOCAINE-PRILOCAINE 2.5-2.5 % EX CREA Topical Apply 1 application topically as needed. Apply to [facility] A Cath site before chemotherapy.     . MORPHINE SULFATE ER 30 MG PO TBCR Oral Take 1 tablet (30 mg total) by mouth 2 (two) times daily. 60 tablet 0  . PANTOPRAZOLE SODIUM 40 MG PO TBEC Oral Take 1 tablet (40 mg total) by mouth 2 (two) times daily. 30 tablet 3  . WARFARIN SODIUM 5 MG PO TABS Oral Take 7.5 mg by mouth daily. One and one-half tablets      BP 122/71  Pulse 111  Temp 98.4 F (36.9 C) (Oral)  Resp 22  SpO2 98%  Physical Exam  Nursing note and vitals reviewed. Constitutional: He is oriented to person, place, and time. He appears well-developed and well-nourished. No distress.  HENT:  Head: Atraumatic.  Nose: Nose normal.  Mouth/Throat: Oropharynx is clear and moist.  Eyes: Conjunctivae normal are normal.  Neck: Neck supple. No JVD present. No tracheal deviation present.  Cardiovascular: Normal rate, regular rhythm, normal heart sounds and intact distal pulses.   Pulmonary/Chest: Effort normal and breath sounds normal. No accessory muscle usage. No respiratory distress.  Abdominal: Soft. Bowel sounds are normal. He exhibits no distension and no mass. There is no tenderness. There is no rebound and no guarding.  Genitourinary:       No cva tenderness  Musculoskeletal: Normal range of motion. He exhibits no tenderness.       Mild bil lower leg edema, left sl greater than right (pt states at baseline.   Neurological: He is alert and oriented to person, place, and time.  Skin: Skin is warm and dry.  Psychiatric: He has a normal mood and affect.    ED Course  Procedures (including critical care time)   Labs Reviewed  CBC  COMPREHENSIVE METABOLIC PANEL  PROTIME-INR  TROPONIN I  PRO B NATRIURETIC PEPTIDE   Results for orders placed during the hospital encounter of 09/10/12  CBC      Component Value Range   WBC 9.0  4.0 - 10.5 K/uL   RBC 3.48 (*) 4.22 - 5.81  MIL/uL   Hemoglobin 10.1 (*) 13.0 - 17.0 g/dL   HCT 09.8 (*) 11.9 - 14.7 %   MCV 94.5  78.0 - 100.0 fL   MCH 29.0  26.0 - 34.0 pg   MCHC 30.7  30.0 - 36.0 g/dL   RDW 82.9 (*) 56.2 - 13.0 %   Platelets 315  150 - 400 K/uL  COMPREHENSIVE METABOLIC PANEL      Component Value Range   Sodium 137  135 - 145 mEq/L   Potassium 4.0  3.5 - 5.1 mEq/L  Chloride 103  96 - 112 mEq/L   CO2 25  19 - 32 mEq/L   Glucose, Bld 163 (*) 70 - 99 mg/dL   BUN 12  6 - 23 mg/dL   Creatinine, Ser 2.13  0.50 - 1.35 mg/dL   Calcium 8.6  8.4 - 08.6 mg/dL   Total Protein 6.2  6.0 - 8.3 g/dL   Albumin 2.7 (*) 3.5 - 5.2 g/dL   AST 14  0 - 37 U/L   ALT 9  0 - 53 U/L   Alkaline Phosphatase 75  39 - 117 U/L   Total Bilirubin 0.2 (*) 0.3 - 1.2 mg/dL   GFR calc non Af Amer 79 (*) >90 mL/min   GFR calc Af Amer >90  >90 mL/min  PROTIME-INR      Component Value Range   Prothrombin Time 42.2 (*) 11.6 - 15.2 seconds   INR 4.85 (*) 0.00 - 1.49  TROPONIN I      Component Value Range   Troponin I <0.30  <0.30 ng/mL  PRO B NATRIURETIC PEPTIDE      Component Value Range   Pro B Natriuretic peptide (BNP) 125.6  0 - 450 pg/mL   Dg Chest 2 View  09/10/2012  *RADIOLOGY REPORT*  Clinical Data: Cough, shortness of breath.  CHEST - 2 VIEW  Comparison: 01/18/2012  Findings: Right Port-A-Cath remains in place, unchanged.  Low lung volumes.  Increasing left basilar opacity concerning for pneumonia. This may represent atelectasis superimposed on chronic changes seen on prior CT.  Cannot exclude pneumonia.  Recommend clinical correlation. Heart is normal size.  There are low lung volumes.  No right effusion.  No acute bony abnormality.  IMPRESSION: Worsening left basilar opacity which may reflect atelectasis due to low lung volumes superimposed on chronic opacity, but cannot exclude pneumonia.  Recommend clinical correlation.  Small left pleural effusion.   Original Report Authenticated By: Cyndie Chime, M.D.       MDM  Labs.  Cxr.  Reviewed nursing notes and prior charts for additional history.     Date: 09/10/2012  Rate: 109  Rhythm: sinus tachycardia  QRS Axis: normal  Intervals: normal  ST/T Wave abnormalities: nonspecific ST/T changes  Conduction Disutrbances:none  Narrative Interpretation:   Old EKG Reviewed: changes noted  Pt notes new cough x 1 week, infiltrate on cxr, possible cap. Will rx rocephin and zithromax.  Pt supratherapeutic on his coumadin. ua pending.   Given hypoxia (pulse ox 87% ra, 94% on 2 liters) and pna. Will admit.   [facility] indicates team 8, med surg, holding orders.      Suzi Roots, MD 09/10/12 1410  Suzi Roots, MD 09/10/12 [phone]

## 2012-09-11 DIAGNOSIS — M7989 Other specified soft tissue disorders: Secondary | ICD-10-CM

## 2012-09-11 DIAGNOSIS — Z86718 Personal history of other venous thrombosis and embolism: Secondary | ICD-10-CM

## 2012-09-11 DIAGNOSIS — M79609 Pain in unspecified limb: Secondary | ICD-10-CM

## 2012-09-11 DIAGNOSIS — D689 Coagulation defect, unspecified: Secondary | ICD-10-CM

## 2012-09-11 DIAGNOSIS — N390 Urinary tract infection, site not specified: Secondary | ICD-10-CM

## 2012-09-11 LAB — URINALYSIS, ROUTINE W REFLEX MICROSCOPIC
Glucose, UA: 100 mg/dL — AB
Leukocytes, UA: NEGATIVE
Nitrite: NEGATIVE
Protein, ur: NEGATIVE mg/dL
pH: 7 (ref 5.0–8.0)

## 2012-09-11 LAB — COMPREHENSIVE METABOLIC PANEL
AST: 14 U/L (ref 0–37)
Albumin: 2.4 g/dL — ABNORMAL LOW (ref 3.5–5.2)
Alkaline Phosphatase: 65 U/L (ref 39–117)
BUN: 10 mg/dL (ref 6–23)
Chloride: 104 mEq/L (ref 96–112)
Potassium: 4.2 mEq/L (ref 3.5–5.1)
Sodium: 139 mEq/L (ref 135–145)
Total Bilirubin: 0.2 mg/dL — ABNORMAL LOW (ref 0.3–1.2)
Total Protein: 5.5 g/dL — ABNORMAL LOW (ref 6.0–8.3)

## 2012-09-11 LAB — GLUCOSE, CAPILLARY
Glucose-Capillary: 71 mg/dL (ref 70–99)
Glucose-Capillary: 125 mg/dL — ABNORMAL HIGH (ref 70–99)

## 2012-09-11 LAB — CBC
HCT: 29.9 % — ABNORMAL LOW (ref 39.0–52.0)
MCH: 28.4 pg (ref 26.0–34.0)
MCV: 94.3 fL (ref 78.0–100.0)
RBC: 3.17 MIL/uL — ABNORMAL LOW (ref 4.22–5.81)
RDW: 17.3 % — ABNORMAL HIGH (ref 11.5–15.5)
WBC: 5.6 10*3/uL (ref 4.0–10.5)

## 2012-09-11 LAB — MAGNESIUM: Magnesium: 1.3 mg/dL — ABNORMAL LOW (ref 1.5–2.5)

## 2012-09-11 LAB — PROTIME-INR
INR: 4.57 — ABNORMAL HIGH (ref 0.00–1.49)
Prothrombin Time: 40.4 seconds — ABNORMAL HIGH (ref 11.6–15.2)

## 2012-09-11 LAB — URINE MICROSCOPIC-ADD ON

## 2012-09-11 LAB — PROCALCITONIN: Procalcitonin: <0.1 ng/mL

## 2012-09-11 MED ORDER — DEXTROSE 50 % IV SOLN
50.0000 mL | Freq: Once | INTRAVENOUS | Status: AC | PRN
  Administered 2012-09-11: 50 mL via INTRAVENOUS
  Filled 2012-09-11: qty 50

## 2012-09-11 MED ORDER — MAGNESIUM SULFATE 40 MG/ML IJ SOLN
2.0000 g | Freq: Once | INTRAMUSCULAR | Status: AC
  Administered 2012-09-11: 2 g via INTRAVENOUS
  Filled 2012-09-11: qty 50

## 2012-09-11 MED ORDER — PHYTONADIONE 5 MG PO TABS
5.0000 mg | ORAL_TABLET | Freq: Once | ORAL | Status: AC
  Administered 2012-09-11: 5 mg via ORAL
  Filled 2012-09-11: qty 1

## 2012-09-11 NOTE — Progress Notes (Signed)
Patient's cbg randomly checked at 0400 due hypoglycemic episodes on day and night shift. He was 89.

## 2012-09-11 NOTE — Progress Notes (Signed)
Hypoglycemic Event  CBG: ** 52  Treatment: 15 GM carbohydrate snack   Symptoms: None  Follow-up CBG: Time:0715 CBG Result:60  Possible Reasons  Unknown  Comments/MD notified:Dr. J Vann    Kieren Adkison K  Remember to initiate Hypoglycemia Order Set & complete

## 2012-09-11 NOTE — Evaluation (Signed)
Physical Therapy Evaluation Patient Details Name: Johnathan Bowman MRN: [id number] DOB: 06/11/1929 Today's Date: 09/11/2012 Time: 4098-1191 PT Time Calculation (min): 10 min  PT Assessment / Plan / Recommendation Clinical Impression  78 yo male admitted with hematuria, hypoglycemia. Pt lives with wife and has to take care of her. Mobilizing fairly well. slightly unsteady at times. O2 sats 86% on RA with ambulation. Anticipate pt will progress well with mobility and independence during stay.     PT Assessment  Patient needs continued PT services    Follow Up Recommendations  No PT follow up    Barriers to Discharge        Equipment Recommendations  None recommended by PT    Recommendations for Other Services     Frequency Min 3X/week    Precautions / Restrictions Precautions Precautions: None Restrictions Weight Bearing Restrictions: No   Pertinent Vitals/Pain       Mobility  Bed Mobility Bed Mobility: Supine to Sit;Sit to Supine Supine to Sit: 6: Modified independent (Device/Increase time) Sit to Supine: 6: Modified independent (Device/Increase time) Transfers Transfers: Sit to Stand;Stand to Sit Sit to Stand: 5: Supervision Stand to Sit: 5: Supervision Details for Transfer Assistance: VCs safety.  Ambulation/Gait Ambulation/Gait Assistance: 4: Min guard Ambulation Distance (Feet): 200 Feet Ambulation/Gait Assistance Details: Pt pushed IV pole. Slightly unsteady with intermittent wavering. Good gait speed. O2 sats: 86% RA.  Gait Pattern: Step-through pattern    Exercises     PT Diagnosis: Difficulty walking  PT Problem List: Decreased mobility;Decreased activity tolerance PT Treatment Interventions: DME instruction;Gait training;Stair training;Functional mobility training;Therapeutic activities;Therapeutic exercise;Patient/family education   PT Goals Acute Rehab PT Goals PT Goal Formulation: With patient Time For Goal Achievement: 09/18/12 Potential to  Achieve Goals: Good Pt will go Sit to Stand: with modified independence PT Goal: Sit to Stand - Progress: Goal set today Pt will Ambulate: >150 feet;with modified independence PT Goal: Ambulate - Progress: Goal set today Pt will Go Up / Down Stairs: 6-9 stairs;with modified independence;with rail(s) PT Goal: Up/Down Stairs - Progress: Goal set today  Visit Information  Last PT Received On: 09/11/12 Assistance Needed: +1    Subjective Data  Subjective: "My wife can't help much. I help take care of her" Patient Stated Goal: Home   Prior Functioning  Home Living Lives With: Spouse Type of Home: House Home Access: Level entry Home Layout: Two level;Bed/bath upstairs Alternate Level Stairs-Number of Steps: 1 flight Alternate Level Stairs-Rails: Right Home Adaptive Equipment: Straight cane Prior Function Level of Independence: Independent Able to Take Stairs?: Yes Driving: Yes Communication Communication: No difficulties    Cognition  Overall Cognitive Status: Appears within functional limits for tasks assessed/performed Arousal/Alertness: Awake/alert Orientation Level: Appears intact for tasks assessed Behavior During Session: [facility] for tasks performed    Extremity/Trunk Assessment Right Lower Extremity Assessment RLE ROM/Strength/Tone: [facility] for tasks assessed Left Lower Extremity Assessment LLE ROM/Strength/Tone: [facility] for tasks assessed Trunk Assessment Trunk Assessment: Normal   Balance    End of Session PT - End of Session Equipment Utilized During Treatment: Gait belt Activity Tolerance: Patient tolerated treatment well Patient left: in bed;with call bell/phone within reach  GP     Rebeca Alert [facility] 09/11/2012, 2:35 PM [phone]

## 2012-09-11 NOTE — Progress Notes (Signed)
Patient had three beats of vtach. Upon assessment  Patient was found to be asleep. No distresses noted. Attending paged for notification.

## 2012-09-11 NOTE — Progress Notes (Signed)
Left:  No evidence of DVT, superficial thrombosis, or Baker's cyst.  Right:  Negative for DVT in the common femoral vein.  

## 2012-09-11 NOTE — Progress Notes (Addendum)
Hypoglycemic Event  CBG: 68  Treatment: 4oz juice  Symptoms: none  Follow-up CBG: Time:2205 CBG Result:72  Possible Reasons for Event: unknown  Comments/MD notified: yes    Johnathan Bowman, Johnathan Bowman  Remember to initiate Hypoglycemia Order Set & complete

## 2012-09-11 NOTE — Progress Notes (Signed)
DIAGNOSIS: Metastatic non-small cell lung cancer, adenocarcinoma, diagnosed in June 2012.  PRIOR THERAPY:  1. Status post left Pleurx catheter placement by Dr. Edwyna Shell on February 08, 2011 for drainage of left sided pleural effusion. 2. Status post 4 cycles of induction chemotherapy with carboplatin for AUC of 6, paclitaxel 200 mg per meter square and Avastin 15 mg/kg given every 3 weeks according to the ECOG protocol 5508 with stable disease. 3. Status post 1 cycle of maintenance Avastin 15 mg/kg given on May 19, 2011, discontinued secondary to gastrointestinal bleed. 4. Maintenance chemotherapy with Alimta 500 mg/m2 every 3 weeks. Status post 9 cycles, discontinued today secondary to disease progression.  CURRENT THERAPY: systemic chemotherapy with docetaxel 75 mg/M2 every 3 weeks with Neulasta support. Status post 5 cycles.  Subjective: The patient is seen and examined today. He was admitted with hematuria started Sunday. His urinalysis showed elevated white blood cells as well as to numerous to count red blood cells. INR the emergency department was elevated at 4.8. The patient was treated with 6 months of Coumadin for deep venous thrombosis and this was discontinued in July of 2013. He mentioned that on Friday he found to have established of Coumadin left in the bottle and he decided to take him because he had some swelling in his lower extremity. He continues to have mild shortness of breath increased with exertion as well as mild cough but no fever or chills.  Objective: Vital signs in last 24 hours: Temp:  [98.4 F (36.9 C)-98.7 F (37.1 C)] 98.7 F (37.1 C) (09/24 0600) Pulse Rate:  [65-113] 65  (09/24 0600) Resp:  [20-22] 20  (09/24 0600) BP: (119-155)/(71-84) 119/75 mmHg (09/24 0600) SpO2:  [90 %-98 %] 92 % (09/24 0600) Weight:  [188 lb (85.276 kg)] 188 lb (85.276 kg) (09/23 1635)  Intake/Output from previous day: 09/23 0701 - 09/24 0700 In: 360 [P.O.:360] Out: 1300  [Urine:1300] Intake/Output this shift: Total I/O In: -  Out: 300 [Urine:300]  General appearance: alert, cooperative and no distress Resp: clear to auscultation bilaterally Cardio: regular rate and rhythm, S1, S2 normal, no murmur, click, rub or gallop GI: soft, non-tender; bowel sounds normal; no masses,  no organomegaly Extremities: extremities normal, atraumatic, no cyanosis or edema  Lab Results:   Basename 09/11/12 0525 09/10/12 1230  WBC 5.6 9.0  HGB 9.0* 10.1*  HCT 29.9* 32.9*  PLT 276 315   BMET  Basename 09/11/12 0525 09/10/12 1230  NA 139 137  K 4.2 4.0  CL 104 103  CO2 28 25  GLUCOSE 83 163*  BUN 10 12  CREATININE 0.83 0.88  CALCIUM 8.5 8.6    Studies/Results: Dg Chest 2 View  09/10/2012  *RADIOLOGY REPORT*  Clinical Data: Cough, shortness of breath.  CHEST - 2 VIEW  Comparison: 01/18/2012  Findings: Right Port-A-Cath remains in place, unchanged.  Low lung volumes.  Increasing left basilar opacity concerning for pneumonia. This may represent atelectasis superimposed on chronic changes seen on prior CT.  Cannot exclude pneumonia.  Recommend clinical correlation. Heart is normal size.  There are low lung volumes.  No right effusion.  No acute bony abnormality.  IMPRESSION: Worsening left basilar opacity which may reflect atelectasis due to low lung volumes superimposed on chronic opacity, but cannot exclude pneumonia.  Recommend clinical correlation.  Small left pleural effusion.   Original Report Authenticated By: Cyndie Chime, M.D.     Medications: I have reviewed the patient's current medications.  CODE STATUS: Full code  Assessment/Plan:  This is a very pleasant 78 years old white male with metastatic non-small cell lung cancer currently on systemic chemotherapy with single agent Taxotere status post 5 cycles. The patient was admitted with hematuria most likely secondary to urinary tract infection as well as coagulopathy from the dose of Coumadin that he took  on Friday. He received a dose of vitamin K yesterday but his INR still elevated. This may take another 2-3 days to decreased to the normal level. Continue antibiotic treatment for the urinary tract infection. The patient will resume her systemic chemotherapy after discharge from the hospital as previously scheduled. Thank you so much for taking good care of Mr. Dejarnette. I will continue to follow up the patient with you and assist in his management on as-needed basis.   LOS: 1 day    Vedanshi Massaro K. 09/11/2012

## 2012-09-11 NOTE — Progress Notes (Addendum)
[facility] PROGRESS NOTE  COCHISE WESP ZOX:[id number] DOB: 1929-07-13 DOA: 09/10/2012 PCP: Dorrene German, MD  Assessment/Plan: Principal Problem:  *Hematuria Active Problems:  ADENOCARCIMONA, LUNG, NONSMALL CELL  Hx of radiation therapy  CAP (community acquired pneumonia)  Hypoxia  Hypercoagulopathy  UTI (lower urinary tract infection)  1. UTI- await cx.- rocephin day #2 2. hypercoagulopathy- patient admitted to taking coumadin at home that he had left over, vit K 3. Hematuria- recheck U/A for resolution, may need urology if not better in hospital vs outpatient cystoscopy as patient was a smoker.  Could be due to UTI as well- monitor UOP 4. Hypoxia 5. CAP- azithro/rocephin 6.  adenocarinoma- getting chemo per heme onc 7. Anemia- ABLA 8.  Hypoglycemia- ?etiology, patient eating, if continue to happen, will order insulin, proinsulin and c-peptide  Code Status: full Family Communication: patient at bedside Disposition Plan: home when better    Consultants:  Heme onc   HPI/Subjective: Still having hematuria No SOB, no fever, no chills Admitted to taking coumadin at home on Friday Some dizziness when getting up   Objective: Filed Vitals:   09/10/12 1249 09/10/12 1635 09/10/12 2200 09/11/12 0600  BP:  155/84 136/76 119/75  Pulse:  113 90 65  Temp:  98.5 F (36.9 C) 98.5 F (36.9 C) 98.7 F (37.1 C)  TempSrc:  Oral Oral Oral  Resp:  21 20 20   Height:  5\' 5"  (1.651 m)    Weight:  85.276 kg (188 lb)    SpO2: 93% 90% 93% 92%    Intake/Output Summary (Last 24 hours) at 09/11/12 0932 Last data filed at 09/11/12 0739  Gross per 24 hour  Intake    360 ml  Output   1600 ml  Net  -1240 ml   Filed Weights   09/10/12 1635  Weight: 85.276 kg (188 lb)    Exam:   General:  A+Ox3, NAD  Cardiovascular: rrr  Respiratory: clear anterior  Abdomen: +Bs, soft, NT/ND  Data Reviewed: Basic Metabolic Panel:  Lab 09/11/12 8119 09/10/12 1230 09/06/12  1002  NA 139 137 138  K 4.2 4.0 4.6  CL 104 103 103  CO2 28 25 24   GLUCOSE 83 163* 232*  BUN 10 12 8.0  CREATININE 0.83 0.88 0.9  CALCIUM 8.5 8.6 8.3*  MG 1.3* -- --  PHOS -- -- --   Liver Function Tests:  Lab 09/11/12 0525 09/10/12 1230 09/06/12 1002  AST 14 14 15   ALT 8 9 12   ALKPHOS 65 75 87  BILITOT 0.2* 0.2* 0.40  PROT 5.5* 6.2 5.6*  ALBUMIN 2.4* 2.7* 2.8*   No results found for this basename: LIPASE:5,AMYLASE:5 in the last 168 hours No results found for this basename: AMMONIA:5 in the last 168 hours CBC:  Lab 09/11/12 0525 09/10/12 1230 09/06/12 1002  WBC 5.6 9.0 9.6  NEUTROABS -- -- 8.4*  HGB 9.0* 10.1* 10.2*  HCT 29.9* 32.9* 31.0*  MCV 94.3 94.5 92.4  PLT 276 315 214   Cardiac Enzymes:  Lab 09/10/12 1230  CKTOTAL --  CKMB --  CKMBINDEX --  TROPONINI <0.30   BNP (last 3 results)  Basename 09/10/12 1230 01/18/12 1020  PROBNP 125.6 68.7   CBG:  Lab 09/11/12 0842 09/11/12 0734 09/11/12 0710 09/10/12 2151 09/10/12 2123  GLUCAP 161* 60* 52* 68* 65*    No results found for this or any previous visit (from the past 240 hour(s)).   Studies: Dg Chest 2 View  09/10/2012  *RADIOLOGY REPORT*  Clinical  Data: Cough, shortness of breath.  CHEST - 2 VIEW  Comparison: 01/18/2012  Findings: Right Port-A-Cath remains in place, unchanged.  Low lung volumes.  Increasing left basilar opacity concerning for pneumonia. This may represent atelectasis superimposed on chronic changes seen on prior CT.  Cannot exclude pneumonia.  Recommend clinical correlation. Heart is normal size.  There are low lung volumes.  No right effusion.  No acute bony abnormality.  IMPRESSION: Worsening left basilar opacity which may reflect atelectasis due to low lung volumes superimposed on chronic opacity, but cannot exclude pneumonia.  Recommend clinical correlation.  Small left pleural effusion.   Original Report Authenticated By: Cyndie Chime, M.D.     Scheduled Meds:   . albuterol  5 mg  Nebulization Once  . amLODipine  5 mg Oral Daily   And  . benazepril  10 mg Oral Daily  . azithromycin  500 mg Intravenous Q24H  . cefTRIAXone (ROCEPHIN)  IV  1 g Intravenous Q24H  . ipratropium  0.5 mg Nebulization Once  . magnesium sulfate 1 - 4 g bolus IVPB  2 g Intravenous Once  . morphine  30 mg Oral BID  . pantoprazole  40 mg Oral BID  . phytonadione  5 mg Oral Once  . DISCONTD: amLODipine-benazepril  1 capsule Oral Daily  . DISCONTD: insulin aspart  0-15 Units Subcutaneous TID WC  . DISCONTD: insulin aspart  0-5 Units Subcutaneous QHS  . DISCONTD: sodium chloride  3 mL Intravenous Q12H  . DISCONTD: sodium chloride  3 mL Intravenous Q12H   Continuous Infusions:   . sodium chloride 20 mL/hr (09/11/12 0325)    Principal Problem:  *Hematuria Active Problems:  ADENOCARCIMONA, LUNG, NONSMALL CELL  Hx of radiation therapy  CAP (community acquired pneumonia)  Hypoxia  Hypercoagulopathy  UTI (lower urinary tract infection)    Time spent: 35    [facility], [place]  [facility] Pager [phone]. If 8PM-8AM, please contact night-coverage at [email or web address], password [facility] 09/11/2012, 9:32 AM  LOS: 1 day

## 2012-09-12 ENCOUNTER — Ambulatory Visit: Payer: Medicare Other

## 2012-09-12 ENCOUNTER — Other Ambulatory Visit: Payer: Medicare Other | Admitting: Lab

## 2012-09-12 ENCOUNTER — Ambulatory Visit: Payer: Medicare Other | Admitting: Physician Assistant

## 2012-09-12 DIAGNOSIS — R600 Localized edema: Secondary | ICD-10-CM | POA: Diagnosis present

## 2012-09-12 LAB — GLUCOSE, CAPILLARY
Glucose-Capillary: 72 mg/dL (ref 70–99)
Glucose-Capillary: 174 mg/dL — ABNORMAL HIGH (ref 70–99)

## 2012-09-12 LAB — PROTIME-INR: INR: 2.34 — ABNORMAL HIGH (ref 0.00–1.49)

## 2012-09-12 LAB — BASIC METABOLIC PANEL
BUN: 7 mg/dL (ref 6–23)
CO2: 29 mEq/L (ref 19–32)
Chloride: 101 mEq/L (ref 96–112)
GFR calc non Af Amer: 78 mL/min — ABNORMAL LOW (ref >90)
Glucose, Bld: 68 mg/dL — ABNORMAL LOW (ref 70–99)
Potassium: 4.7 mEq/L (ref 3.5–5.1)
Sodium: 138 mEq/L (ref 135–145)

## 2012-09-12 LAB — URINE CULTURE: Culture: NO GROWTH

## 2012-09-12 LAB — CBC
HCT: 33.4 % — ABNORMAL LOW (ref 39.0–52.0)
Hemoglobin: 9.9 g/dL — ABNORMAL LOW (ref 13.0–17.0)
MCHC: 29.6 g/dL — ABNORMAL LOW (ref 30.0–36.0)
RBC: 3.51 MIL/uL — ABNORMAL LOW (ref 4.22–5.81)

## 2012-09-12 NOTE — Progress Notes (Signed)
[facility] PROGRESS NOTE  Johnathan Bowman ZOX:[id number] DOB: Jul 23, 1929 DOA: 09/10/2012 PCP: Dorrene German, MD  Brief narrative: Johnathan Bowman is an 78 year old man who has a PMH of DVT who presented to the ER on 09/10/12 with hematuria and a supratherapeutic INR.  Upon further investigation, the coumadin was stopped in 06/2012 after 6 months of treatment, then he recently began to take some left over tablets after he noticed some lower extremity swelling.  Assessment/Plan: Principal Problem:  *Hematuria  Completely resolved with correction of the patient's INR. Active Problems:  Lower extremity edema  Re-check dopplers to rule out recurrent DVT.  ADENOCARCIMONA, LUNG, NONSMALL CELL /  Hx of radiation therapy  Diagnosed 05/2011, under the care of Dr. Arbutus Ped.  Currently on systemic chemotherapy Q 3 weeks with Neulasta support.  CAP (community acquired pneumonia)  CXR shows a possible infiltrate, but patient has been afebrile without leukocytosis.  Mildly hypoxic on admission.  On empiric Rocephin/Azithromycin.  Hypoxia  Oxygen saturations 90% on admission.  Now maintaining sats in mid 90's.  History of DVTs but INR supratherapeutic on admission, so doubt PE, but if dopplers + for DVT, may need to rule out PE.    Continue to treat PNA.  Hypercoagulopathy  Given Vitamin K x 1 on 09/10/12.  INR now in therapeutic range.  UTI (lower urinary tract infection)  Urine culture negative.  H/O DVT  Treated with coumadin x 6 months, then coumadin d/c'd 06/2012.  Resumed taking coumadin because he had medication "left over" when he noticed swelling in his lower extremity.  Code Status: Full. Family Communication: None at bedside. Disposition Plan: Home when stable.   Medical Consultants:  Dr. Si Gaul, Oncology.  Other Consultants:  Physical therapy: Home health PT  Procedures:  None.  Antibiotics:  Rocephin 9/23--->  Azithromycin  09/10/12--->  HPI/Subjective: Johnathan Bowman feels a little short of breath, has a cough with occasional "dark" sputum.  No further hematuria.  Feels a bit dizzy, especially with position changes.  Objective: Filed Vitals:   09/11/12 1407 09/11/12 2200 09/12/12 0622 09/12/12 1423  BP: 144/77 126/70 128/77 135/74  Pulse: 68 101 101 95  Temp: 98.5 F (36.9 C) 98.4 F (36.9 C) 98.5 F (36.9 C) 98.5 F (36.9 C)  TempSrc: Oral Oral Oral Oral  Resp: 20 20 22 20   Height:      Weight:      SpO2: 95% 94% 94% 93%    Intake/Output Summary (Last 24 hours) at 09/12/12 1600 Last data filed at 09/12/12 1404  Gross per 24 hour  Intake      0 ml  Output    725 ml  Net   -725 ml    Exam: Gen:  NAD Cardiovascular:  RRR, No M/R/G Respiratory: Lungs diminished Gastrointestinal: Abdomen soft, NT/ND with normal active bowel sounds. Extremities: 2+ edema  Data Reviewed: Basic Metabolic Panel:  Lab 09/12/12 8119 09/11/12 0525 09/10/12 1230 09/06/12 1002  NA 138 139 137 138  K 4.7 4.2 -- --  CL 101 104 103 103  CO2 29 28 25 24   GLUCOSE 68* 83 163* 232*  BUN 7 10 12  8.0  CREATININE 0.91 0.83 0.88 0.9  CALCIUM 8.9 8.5 8.6 8.3*  MG -- 1.3* -- --  PHOS -- -- -- --   GFR Estimated Creatinine Clearance: 65 ml/min (by C-G formula based on Cr of 0.91). Liver Function Tests:  Lab 09/11/12 0525 09/10/12 1230 09/06/12 1002  AST 14 14 15   ALT  8 9 12   ALKPHOS 65 75 87  BILITOT 0.2* 0.2* 0.40  PROT 5.5* 6.2 5.6*  ALBUMIN 2.4* 2.7* 2.8*   Coagulation profile  Lab 09/12/12 0515 09/11/12 0525 09/10/12 1230  INR 2.34* 4.57* 4.85*  PROTIME -- -- --    CBC:  Lab 09/12/12 0515 09/11/12 0525 09/10/12 1230 09/06/12 1002  WBC 6.4 5.6 9.0 9.6  NEUTROABS -- -- -- 8.4*  HGB 9.9* 9.0* 10.1* 10.2*  HCT 33.4* 29.9* 32.9* 31.0*  MCV 95.2 94.3 94.5 92.4  PLT 315 276 315 214   Cardiac Enzymes:  Lab 09/10/12 1230  CKTOTAL --  CKMB --  CKMBINDEX --  TROPONINI <0.30   BNP (last 3  results)  Basename 09/10/12 1230 01/18/12 1020  PROBNP 125.6 68.7   CBG:  Lab 09/12/12 1220 09/12/12 0748 09/11/12 2121 09/11/12 1641 09/11/12 1449  GLUCAP 150* 174* 125* 90 163*   Microbiology Recent Results (from the past 240 hour(s))  URINE CULTURE     Status: Normal   Collection Time   09/10/12  2:12 PM      Component Value Range Status Comment   Specimen Description URINE, CLEAN CATCH   Final    Special Requests NONE   Final    Culture  Setup Time 09/11/2012 01:29   Final    Colony Count NO GROWTH   Final    Culture NO GROWTH   Final    Report Status 09/12/2012 FINAL   Final      Studies:  Dg Chest 2 View 09/10/2012 IMPRESSION: Worsening left basilar opacity which may reflect atelectasis due to low lung volumes superimposed on chronic opacity, but cannot exclude pneumonia.  Recommend clinical correlation.  Small left pleural effusion.   Original Report Authenticated By: Cyndie Chime, M.D.       Scheduled Meds:   . amLODipine  5 mg Oral Daily   And  . benazepril  10 mg Oral Daily  . azithromycin  500 mg Intravenous Q24H  . cefTRIAXone (ROCEPHIN)  IV  1 g Intravenous Q24H  . morphine  30 mg Oral BID  . pantoprazole  40 mg Oral BID  . phytonadione  5 mg Oral Once   Continuous Infusions:   . sodium chloride 20 mL/hr (09/11/12 0325)    Time spent: 35 minutes.   LOS: 2 days   Johnathan Bowman  [facility] Pager [phone].  If 8PM-8AM, please contact night-coverage at [email or web address], password [facility] 09/12/2012, 4:00 PM

## 2012-09-12 NOTE — Plan of Care (Signed)
Problem: Phase III Progression Outcomes Goal: Activity at appropriate level-compared to baseline (UP IN CHAIR FOR HEMODIALYSIS)  O2 sats dropped to 64% on RA with ambulation during PT session

## 2012-09-12 NOTE — Progress Notes (Signed)
Physical Therapy Treatment Patient Details Name: Johnathan Bowman MRN: [id number] DOB: January 29, 1929 Today's Date: 09/12/2012 Time: 8657-8469 PT Time Calculation (min): 25 min  PT Assessment / Plan / Recommendation Comments on Treatment Session  Worked on standing exercises to challenge balance (with and without 1 hand support). O2 sats dropped to 64% on RA with ambulation. Recommend HHPT for balance training.     Follow Up Recommendations  Home health PT    Barriers to Discharge        Equipment Recommendations  None recommended by PT    Recommendations for Other Services    Frequency Min 3X/week   Plan Discharge plan remains appropriate    Precautions / Restrictions Precautions Precautions: Fall Restrictions Weight Bearing Restrictions: No   Pertinent Vitals/Pain     Mobility  Bed Mobility Bed Mobility: Supine to Sit Supine to Sit: 6: Modified independent (Device/Increase time) Transfers Transfers: Sit to Stand;Stand to Sit Sit to Stand: 5: Supervision Stand to Sit: 5: Supervision Ambulation/Gait Ambulation/Gait Assistance: 4: Min guard Ambulation Distance (Feet): 200 Feet Assistive device: None Ambulation/Gait Assistance Details: Intermittent wavering. Good gait speed (slightly fast). O2 sats 64% on RA with ambulation.  Gait Pattern: Step-through pattern    Exercises General Exercises - Lower Extremity Ankle Circles/Pumps: AROM;Both;10 reps;Standing Hip Flexion/Marching: AROM;Both;10 reps;Standing Mini-Sqauts: AROM;Standing;5 reps (sit-to-stand, without using hands)   PT Diagnosis:    PT Problem List:   PT Treatment Interventions:     PT Goals Acute Rehab PT Goals Pt will go Sit to Stand: with modified independence PT Goal: Sit to Stand - Progress: Progressing toward goal Pt will Ambulate: >150 feet;with modified independence PT Goal: Ambulate - Progress: Progressing toward goal  Visit Information  Last PT Received On: 09/12/12 Assistance Needed: +1      Subjective Data  Subjective: "I feel more off balance when you hold me" Patient Stated Goal: Home   Cognition  Overall Cognitive Status: Appears within functional limits for tasks assessed/performed Arousal/Alertness: Awake/alert Orientation Level: Appears intact for tasks assessed Behavior During Session: [facility] for tasks performed    Balance     End of Session PT - End of Session Equipment Utilized During Treatment: Gait belt Activity Tolerance: Patient tolerated treatment well (ambulation distance limited by drop in O2 sats) Patient left: in bed;with call bell/phone within reach   GP     Rebeca Alert [facility] 09/12/2012, 2:36 PM [phone]

## 2012-09-13 ENCOUNTER — Ambulatory Visit: Payer: Medicare Other

## 2012-09-13 DIAGNOSIS — R609 Edema, unspecified: Secondary | ICD-10-CM

## 2012-09-13 LAB — GLUCOSE, CAPILLARY
Glucose-Capillary: 191 mg/dL — ABNORMAL HIGH (ref 70–99)
Glucose-Capillary: 171 mg/dL — ABNORMAL HIGH (ref 70–99)

## 2012-09-13 MED ORDER — ENOXAPARIN SODIUM 40 MG/0.4ML ~~LOC~~ SOLN
40.0000 mg | SUBCUTANEOUS | Status: DC
  Administered 2012-09-13 – 2012-09-14 (×2): 40 mg via SUBCUTANEOUS
  Filled 2012-09-13 (×3): qty 0.4

## 2012-09-13 MED ORDER — TUBERCULIN PPD 5 UNIT/0.1ML ID SOLN
5.0000 [IU] | Freq: Once | INTRADERMAL | Status: AC
  Administered 2012-09-13: 5 [IU] via INTRADERMAL
  Filled 2012-09-13: qty 0.1

## 2012-09-13 NOTE — Progress Notes (Signed)
[facility] PROGRESS NOTE  Johnathan Bowman [id number] DOB: [birth year] DOA: 09/10/2012 PCP: Dorrene German, MD  Brief narrative: Johnathan Bowman is an 78 year old man who has a PMH of DVT who presented to the ER on 09/10/12 with hematuria and a supratherapeutic INR.  Upon further investigation, the coumadin was stopped in 06/2012 after 6 months of treatment, then he recently began to take some left over tablets after he noticed some lower extremity swelling.  Patient now tells me that a friend has been treated for TB and is "worried" that he may have been exposed.  Assessment/Plan: Principal Problem:  *Hematuria  Completely resolved with correction of the patient's INR. Active Problems:  Lower extremity edema  Re-checked dopplers to rule out recurrent DVT on 09/12/12: Negative results.  Pro BNP not elevated to suggest heart failure.  Start DVT prophylaxis with Lovenox.  ADENOCARCIMONA, LUNG, NONSMALL CELL /  Hx of radiation therapy  Diagnosed 05/2011, under the care of Dr. Arbutus Ped.  Currently on systemic chemotherapy Q 3 weeks with Neulasta support.  CAP (community acquired pneumonia)  CXR shows a possible infiltrate, but patient has been afebrile without leukocytosis.  Mildly hypoxic on admission.  On empiric Rocephin/Azithromycin.  Check PPD given patient's report of TB exposure.  Hypoxia  Oxygen saturations 90% on admission.  Now maintaining sats in mid 90's.  History of DVTs but INR supratherapeutic on admission, so doubt PE, especially with negative LE dopplers.    Continue to treat PNA.  Hypercoagulopathy  Given Vitamin K x 1 on 09/10/12.  Coumadin previously d/c'd.    UTI (lower urinary tract infection)  Urine culture negative.  H/O DVT  Treated with coumadin x 6 months, then coumadin d/c'd 06/2012.  Resumed taking coumadin because he had medication "left over" when he noticed swelling in his lower extremity.  LE dopplers negative for recurrent DVT.  Start  prophylactic dose Lovenox.  Code Status: Full. Family Communication: None at bedside. Disposition Plan: Home when stable.   Medical Consultants:  Dr. Si Gaul, Oncology.  Other Consultants:  Physical therapy: Home health PT  Procedures:  None.  Antibiotics:  Rocephin 9/23--->  Azithromycin 09/10/12--->  HPI/Subjective: Johnathan Bowman continues to complain of being short of breath.  No further hematuria.  Feels a bit dizzy, especially with position changes.  Objective: Filed Vitals:   09/12/12 1423 09/12/12 2034 09/13/12 0443 09/13/12 1416  BP: 135/74 132/68 137/68 139/83  Pulse: 95 96 93 107  Temp: 98.5 F (36.9 C) 98 F (36.7 C) 97.9 F (36.6 C) 97.9 F (36.6 C)  TempSrc: Oral Oral Oral Oral  Resp: 20 19 18 18   Height:      Weight:      SpO2: 93% 92% 92% 94%    Intake/Output Summary (Last 24 hours) at 09/13/12 1554 Last data filed at 09/13/12 1419  Gross per 24 hour  Intake    960 ml  Output   1550 ml  Net   -590 ml    Exam: Gen:  NAD Cardiovascular:  RRR, No M/R/G Respiratory: Lungs diminished Gastrointestinal: Abdomen soft, NT/ND with normal active bowel sounds. Extremities: 2+ edema  Data Reviewed: Basic Metabolic Panel:  Lab 09/12/12 2725 09/11/12 0525 09/10/12 1230  NA 138 139 137  K 4.7 4.2 --  CL 101 104 103  CO2 29 28 25   GLUCOSE 68* 83 163*  BUN 7 10 12   CREATININE 0.91 0.83 0.88  CALCIUM 8.9 8.5 8.6  MG -- 1.3* --  PHOS -- -- --  GFR Estimated Creatinine Clearance: 65 ml/min (by C-G formula based on Cr of 0.91). Liver Function Tests:  Lab 09/11/12 0525 09/10/12 1230  AST 14 14  ALT 8 9  ALKPHOS 65 75  BILITOT 0.2* 0.2*  PROT 5.5* 6.2  ALBUMIN 2.4* 2.7*   Coagulation profile  Lab 09/12/12 0515 09/11/12 0525 09/10/12 1230  INR 2.34* 4.57* 4.85*  PROTIME -- -- --    CBC:  Lab 09/12/12 0515 09/11/12 0525 09/10/12 1230  WBC 6.4 5.6 9.0  NEUTROABS -- -- --  HGB 9.9* 9.0* 10.1*  HCT 33.4* 29.9* 32.9*  MCV 95.2  94.3 94.5  PLT 315 276 315   Cardiac Enzymes:  Lab 09/10/12 1230  CKTOTAL --  CKMB --  CKMBINDEX --  TROPONINI <0.30   BNP (last 3 results)  Basename 09/10/12 1230 01/18/12 1020  PROBNP 125.6 68.7   CBG:  Lab 09/12/12 2032 09/12/12 1717 09/12/12 1220 09/12/12 0748 09/11/12 2121  GLUCAP 131* 127* 150* 174* 125*   Microbiology Recent Results (from the past 240 hour(s))  URINE CULTURE     Status: Normal   Collection Time   09/10/12  2:12 PM      Component Value Range Status Comment   Specimen Description URINE, CLEAN CATCH   Final    Special Requests NONE   Final    Culture  Setup Time 09/11/2012 01:29   Final    Colony Count NO GROWTH   Final    Culture NO GROWTH   Final    Report Status 09/12/2012 FINAL   Final      Studies:  Dg Chest 2 View 09/10/2012 IMPRESSION: Worsening left basilar opacity which may reflect atelectasis due to low lung volumes superimposed on chronic opacity, but cannot exclude pneumonia.  Recommend clinical correlation.  Small left pleural effusion.   Original Report Authenticated By: Cyndie Chime, M.D.       Scheduled Meds:    . amLODipine  5 mg Oral Daily   And  . benazepril  10 mg Oral Daily  . azithromycin  500 mg Intravenous Q24H  . cefTRIAXone (ROCEPHIN)  IV  1 g Intravenous Q24H  . morphine  30 mg Oral BID  . pantoprazole  40 mg Oral BID  . phytonadione  5 mg Oral Once  . tuberculin  5 Units Intradermal Once   Continuous Infusions:    . sodium chloride 20 mL/hr (09/11/12 0325)    Time spent: 25 minutes.   LOS: 3 days   RAMA,CHRISTINA  [facility] Pager [phone].  If 8PM-8AM, please contact night-coverage at [email or web address], password [facility] 09/13/2012, 3:54 PM

## 2012-09-13 NOTE — Progress Notes (Signed)
Bilateral:  No evidence of DVT, superficial thrombosis, or Baker's Cyst.   

## 2012-09-13 NOTE — Progress Notes (Signed)
Pt states that he has an appointment wit Dr Arbutus Ped and wants [place] to call and let him know he is here.  The cancer called and a message left with Dr Asa Lente nurse, informing her of pt being here. Pt informed of this.

## 2012-09-14 DIAGNOSIS — R05 Cough: Secondary | ICD-10-CM

## 2012-09-14 DIAGNOSIS — C3490 Malignant neoplasm of unspecified part of unspecified bronchus or lung: Secondary | ICD-10-CM

## 2012-09-14 DIAGNOSIS — I82409 Acute embolism and thrombosis of unspecified deep veins of unspecified lower extremity: Secondary | ICD-10-CM

## 2012-09-14 LAB — GLUCOSE, CAPILLARY
Glucose-Capillary: 167 mg/dL — ABNORMAL HIGH (ref 70–99)
Glucose-Capillary: 155 mg/dL — ABNORMAL HIGH (ref 70–99)

## 2012-09-14 LAB — MAGNESIUM: Magnesium: 1.6 mg/dL (ref 1.5–2.5)

## 2012-09-14 MED ORDER — AZITHROMYCIN 500 MG PO TABS
500.0000 mg | ORAL_TABLET | Freq: Every day | ORAL | Status: DC
  Administered 2012-09-14 – 2012-09-15 (×2): 500 mg via ORAL
  Filled 2012-09-14 (×2): qty 1

## 2012-09-14 NOTE — Plan of Care (Signed)
Problem: Phase II Progression Outcomes Goal: Walk in hall or up in chair TID O2 sats: 93% 2L O2 at rest, 78% on 2L O2 during ambulation with PT

## 2012-09-14 NOTE — Progress Notes (Signed)
Physical Therapy Treatment Patient Details Name: Johnathan Bowman MRN: [id number] DOB: 1929-09-28 Today's Date: 09/14/2012 Time: 4098-1191 PT Time Calculation (min): 23 min  PT Assessment / Plan / Recommendation Comments on Treatment Session  HHPT. O2 sats dropped to 78% on 2L O2. Recommended to pt that he use his cane for ambulation at home.     Follow Up Recommendations  Home health PT    Barriers to Discharge        Equipment Recommendations  None recommended by PT    Recommendations for Other Services    Frequency Min 3X/week   Plan Discharge plan remains appropriate    Precautions / Restrictions Precautions Precautions: Fall Restrictions Weight Bearing Restrictions: No   Pertinent Vitals/Pain O2 sats: 93% 2L O2 at rest, 78% on 2L O2 during ambulation    Mobility  Bed Mobility Bed Mobility: Supine to Sit Supine to Sit: 6: Modified independent (Device/Increase time) Transfers Transfers: Sit to Stand;Stand to Sit Sit to Stand: 5: Supervision Stand to Sit: 6: Modified independent (Device/Increase time) Ambulation/Gait Ambulation/Gait Assistance: 4: Min guard Ambulation Distance (Feet): 200 Feet (x2) Ambulation/Gait Assistance Details: Pt pushed IV pole. O2 sats dropped to 78% on 2L O2. VCs pacing. Seated rest breaks in between walks.     Exercises     PT Diagnosis:    PT Problem List:   PT Treatment Interventions:     PT Goals Acute Rehab PT Goals Pt will go Sit to Stand: with modified independence PT Goal: Sit to Stand - Progress: Progressing toward goal Pt will Ambulate: >150 feet;with modified independence PT Goal: Ambulate - Progress: Progressing toward goal  Visit Information  Last PT Received On: 09/14/12 Assistance Needed: +1    Subjective Data  Subjective: "I don'tknow when I'm going home" Patient Stated Goal: Home   Cognition  Overall Cognitive Status: Appears within functional limits for tasks assessed/performed Arousal/Alertness:  Awake/alert Orientation Level: Appears intact for tasks assessed Behavior During Session: [facility] for tasks performed    Balance     End of Session PT - End of Session Activity Tolerance: Patient tolerated treatment well Patient left: with call bell/phone within reach   GP     Rebeca Alert [facility] 09/14/2012, 4:08 PM [phone]

## 2012-09-14 NOTE — Progress Notes (Signed)
[facility] PROGRESS NOTE  Johnathan Bowman [id number] DOB: Apr 24, 1929 DOA: 09/10/2012 PCP: Dorrene German, MD  Brief narrative: 77 year old man who has a PMH of DVT who presented to the ER on 09/10/12 with hematuria and a supratherapeutic INR. Upon further investigation, the coumadin was stopped in 06/2012 after 6 months of treatment, then he recently began to take some left over tablets after he noticed some lower extremity swelling.   Assessment/Plan:   Principal Problem:  *Hematuria  Completely resolved with correction of the patient's INR.  Active Problems:  Lower extremity edema  Re-checked dopplers to rule out recurrent DVT on 09/12/12: Negative results.  Pro BNP not elevated to suggest heart failure.  Started DVT prophylaxis with Lovenox. ADENOCARCIMONA, LUNG, NONSMALL CELL / Hx of radiation therapy  Diagnosed 05/2011, under the care of Dr. Arbutus Ped.  Currently on systemic chemotherapy Q 3 weeks with Neulasta support. CAP (community acquired pneumonia)  Continue  empiric Rocephin/Azithromycin.  Check PPD site tomorrow am Hypoxia  Oxygen saturations 90% on admission.  Respiratory status stable History of DVTs but INR supratherapeutic on admission, so doubt PE, especially with negative LE dopplers.  We will continue to treat pneumonia Hypercoagulopathy  Given Vitamin K x 1 on 09/10/12.  UTI (lower urinary tract infection)  Urine culture negative. H/O DVT  Treated with coumadin x 6 months, then coumadin d/c'd 06/2012.  Resumed taking coumadin because he had medication "left over" when he noticed swelling in his lower extremity.  LE dopplers negative for recurrent DVT.  Started prophylactic dose Lovenox.  Code Status: Full.  Family Communication: None at bedside.  Disposition Plan: Home when stable.   Medical Consultants:  Dr. Si Gaul, Oncology. Other Consultants:  Physical therapy: Home health PT Procedures:  None. Antibiotics:  Rocephin 9/23--->    Azithromycin 09/10/12--->  Manson Passey, MD  [facility] Pager [phone]  If 7PM-7AM, please contact night-coverage [email or web address] Password [email or web address] 09/14/2012, 2:10 PM   LOS: 4 days   HPI/Subjective: No acute events overnight.  Objective: Filed Vitals:   09/13/12 0443 09/13/12 1416 09/13/12 2200 09/14/12 0215  BP: 137/68 139/83 132/74 136/73  Pulse: 93 107 90 92  Temp: 97.9 F (36.6 C) 97.9 F (36.6 C) 98.5 F (36.9 C) 98.3 F (36.8 C)  TempSrc: Oral Oral Oral Oral  Resp: 18 18 18 18   Height:      Weight:      SpO2: 92% 94% 91% 91%    Intake/Output Summary (Last 24 hours) at 09/14/12 1410 Last data filed at 09/13/12 2200  Gross per 24 hour  Intake    540 ml  Output   1400 ml  Net   -860 ml    Exam:   General:  Pt is alert, follows commands appropriately, not in acute distress  Cardiovascular: Regular rate and rhythm, S1/S2, no murmurs, no rubs, no gallops  Respiratory: diminished breath sounds at bases, no wheezing, no crackles, no rhonchi  Abdomen: Soft, non tender, non distended, bowel sounds present, no guarding  Extremities: No edema, pulses DP and PT palpable bilaterally  Neuro: Grossly nonfocal  Data Reviewed: Basic Metabolic Panel:  Lab 09/12/12 6440 09/11/12 0525 09/10/12 1230  NA 138 139 137  K 4.7 4.2 4.0  CL 101 104 103  CO2 29 28 25   GLUCOSE 68* 83 163*  BUN 7 10 12   CREATININE 0.91 0.83 0.88  CALCIUM 8.9 8.5 8.6  MG -- 1.3* --  PHOS -- -- --   Liver Function Tests:  Lab 09/11/12  0525 09/10/12 1230  AST 14 14  ALT 8 9  ALKPHOS 65 75  BILITOT 0.2* 0.2*  PROT 5.5* 6.2  ALBUMIN 2.4* 2.7*   CBC:  Lab 09/12/12 0515 09/11/12 0525 09/10/12 1230  WBC 6.4 5.6 9.0  HGB 9.9* 9.0* 10.1*  HCT 33.4* 29.9* 32.9*  MCV 95.2 94.3 94.5  PLT 315 276 315   Cardiac Enzymes:  Lab 09/10/12 1230  CKTOTAL --  CKMB --  CKMBINDEX --  TROPONINI <0.30   BNP: No components found with this basename: POCBNP:5 CBG:  Lab 09/14/12 1225 09/14/12 0737  09/14/12 0407 09/14/12 0001 09/13/12 2103  GLUCAP 155* 167* 156* 173* 137*    Recent Results (from the past 240 hour(s))  URINE CULTURE     Status: Normal   Collection Time   09/10/12  2:12 PM      Component Value Range Status Comment   Specimen Description URINE, CLEAN CATCH   Final    Special Requests NONE   Final    Culture  Setup Time 09/11/2012 01:29   Final    Colony Count NO GROWTH   Final    Culture NO GROWTH   Final    Report Status 09/12/2012 FINAL   Final      Studies: No results found.  Scheduled Meds:   . amLODipine  5 mg Oral Daily   And  . benazepril  10 mg Oral Daily  . azithromycin  500 mg Oral Daily  . cefTRIAXone  1 g Intravenous Q24H  . enoxaparin (LOVENOX)   40 mg Subcutaneous Q24H  . morphine  30 mg Oral BID  . pantoprazole  40 mg Oral BID   Continuous Infusions:   . sodium chloride 20 mL/hr (09/11/12 0325)

## 2012-09-14 NOTE — Progress Notes (Signed)
PHARMACIST - PHYSICIAN COMMUNICATION DR:   [facility] CONCERNING: Antibiotic IV to Oral Route Change Policy  RECOMMENDATION: This patient is receiving azithromycin by the intravenous route.  Based on criteria approved by the Pharmacy and Therapeutics Committee, the antibiotic(s) is/are being converted to the equivalent oral dose form(s).   DESCRIPTION: These criteria include:  Patient being treated for a respiratory tract infection, urinary tract infection, or cellulitis  The patient is not neutropenic and does not exhibit a GI malabsorption state  The patient is eating (either orally or via tube) and/or has been taking other orally administered medications for a least 24 hours  The patient is improving clinically and has a Tmax < 100.5  If you have questions about this conversion, please contact the Pharmacy Department  []   [phone] )  [facility] []   [phone] )  [facility]  []   [phone] )  [facility] [x]   [phone] )  [facility]   Consider change IV ceftriaxone to ceftin 500mg  PO BID and complete 7-10 days of therapy (today is D#5 abx)  Thanks.  Juliette Alcide, PharmD, BCPS.   Pager: [phone] 09/14/2012 9:05 AM

## 2012-09-15 LAB — GLUCOSE, CAPILLARY
Glucose-Capillary: 143 mg/dL — ABNORMAL HIGH (ref 70–99)
Glucose-Capillary: 131 mg/dL — ABNORMAL HIGH (ref 70–99)

## 2012-09-15 MED ORDER — SODIUM CHLORIDE 0.9 % IJ SOLN: 10.0000 mL | Freq: Two times a day (BID) | INTRAMUSCULAR | Status: DC

## 2012-09-15 MED ORDER — HYDROXYZINE HCL 25 MG PO TABS: 25.0000 mg | ORAL_TABLET | ORAL | Status: DC | PRN

## 2012-09-15 MED ORDER — HEPARIN SOD (PORK) LOCK FLUSH 100 UNIT/ML IV SOLN
500.0000 [IU] | INTRAVENOUS | Status: AC | PRN
  Administered 2012-09-15: 500 [IU]

## 2012-09-15 MED ORDER — LEVOFLOXACIN 500 MG PO TABS: 500.0000 mg | ORAL_TABLET | Freq: Every day | ORAL | Status: DC

## 2012-09-15 MED ORDER — MORPHINE SULFATE ER 30 MG PO TBCR: 30.0000 mg | EXTENDED_RELEASE_TABLET | Freq: Two times a day (BID) | ORAL | Status: DC

## 2012-09-15 MED ORDER — PREDNISONE 50 MG PO TABS: ORAL_TABLET | ORAL | Status: DC

## 2012-09-15 MED ORDER — SODIUM CHLORIDE 0.9 % IJ SOLN
10.0000 mL | INTRAMUSCULAR | Status: DC | PRN
  Administered 2012-09-15: 10 mL

## 2012-09-15 MED ORDER — ALBUTEROL SULFATE (2.5 MG/3ML) 0.083% IN NEBU: 2.5000 mg | INHALATION_SOLUTION | Freq: Four times a day (QID) | RESPIRATORY_TRACT | Status: DC | PRN

## 2012-09-15 MED ORDER — HYDROCODONE-ACETAMINOPHEN 5-500 MG PO TABS: 1.0000 | ORAL_TABLET | Freq: Four times a day (QID) | ORAL | Status: DC | PRN

## 2012-09-15 NOTE — Discharge Summary (Signed)
Physician Discharge Summary  Johnathan Bowman JXB:[id number] DOB: 09/09/1929 DOA: 09/10/2012  PCP: Dorrene German, MD  Admit date: 09/10/2012 Discharge date: 09/15/2012  Recommendations for Outpatient Follow-up:  1. With PCP in 1-2 weeks post discharge  Discharge Diagnoses:  Principal Problem:  *Hematuria Active Problems:  ADENOCARCIMONA, LUNG, NONSMALL CELL  Hx of radiation therapy  CAP (community acquired pneumonia)  Hypoxia  Hypercoagulopathy  UTI (lower urinary tract infection)  Lower extremity edema  Discharge Condition: medically stable and clinically appears well for discharge home today with HHPT?OT?RN and home oxygen  Diet recommendation: as tolerated  History of present illness:  78 year old man who has a PMH of DVT who presented to the ER on 09/10/12 with hematuria and a supratherapeutic INR. Upon further investigation, the coumadin was stopped in 06/2012 after 6 months of treatment, then he recently began to take some left over tablets after he noticed some lower extremity swelling.   Assessment/Plan:   Principal Problem:  *Hematuria  Completely resolved with correction of the patient's INR.  Active Problems:  Lower extremity edema  Re-checked dopplers to rule out recurrent DVT on 09/12/12: Negative results.  Pro BNP not elevated to suggest heart failure.  Started DVT prophylaxis with Lovenox while in hospital ADENOCARCIMONA, LUNG, NONSMALL CELL / Hx of radiation therapy  Diagnosed 05/2011, under the care of Dr. Arbutus Ped.  Currently on systemic chemotherapy Q 3 weeks with Neulasta support. CAP (community acquired pneumonia)  Continue empiric Rocephin/Azithromycin while in hospital but d/c home with Levaquin.  Checked PPD  Site - negative Hypoxia  Oxygen saturations 90% on admission.  Will go home with cont O2 via nasal canula History of DVTs but INR supratherapeutic on admission, so doubt PE, especially with negative LE dopplers.  Hypercoagulopathy  Given  Vitamin K x 1 on 09/10/12.  UTI (lower urinary tract infection)  Urine culture negative. H/O DVT  Treated with coumadin x 6 months, then coumadin d/c'd 06/2012.  LE dopplers negative for recurrent DVT.  No coumadin needed on discharge  Code Status: Full.  Family Communication: None at bedside.  Disposition Plan: Home today with HH PT/OT and RN; home with home oxygen  Medical Consultants:  Dr. Si Gaul, Oncology. Other Consultants:  Physical therapy: Home health PT Procedures:  None. Antibiotics:  Rocephin 9/23---> 09/15/2012 Azithromycin 09/10/12--->09/15/2012 Levaquin for 7 days on discharge  Manson Passey, MD  [facility]  Pager [phone]      Discharge Exam: Filed Vitals:   09/15/12 0444  BP: 120/69  Pulse: 110  Temp: 98.7 F (37.1 C)  Resp:    Filed Vitals:   09/14/12 0215 09/14/12 1528 09/14/12 2059 09/15/12 0444  BP: 136/73 124/67 144/78 120/69  Pulse: 92 99 89 110  Temp: 98.3 F (36.8 C) 98.6 F (37 C) 98.3 F (36.8 C) 98.7 F (37.1 C)  TempSrc: Oral Oral Oral Oral  Resp: 18 18 20    Height:      Weight:      SpO2: 91% 98% 91% 93%    General: Pt is alert, follows commands appropriately, not in acute distress Cardiovascular: Regular rate and rhythm, S1/S2 +, no murmurs, no rubs, no gallops Respiratory: Clear to auscultation bilaterally, mild  Wheezing over upper lobes, no crackles, no rhonchi Abdominal: Soft, non tender, non distended, bowel sounds +, no guarding Extremities: no edema, no cyanosis, pulses palpable bilaterally DP and PT Neuro: Grossly nonfocal  Discharge Instructions  Discharge Orders    Future Appointments: Provider: Department: Dept Phone: Center:   09/19/2012  2:15 PM Krista Blue [facility] [phone] None   09/26/2012 2:15 PM Krista Blue [facility] [phone] None   10/03/2012 1:30 PM Beverely Pace Shumate [facility] [phone] None   10/03/2012 2:00 PM [facility] [phone]  None   10/04/2012 11:00 AM [facility] Inj Nurse [facility] [phone] None   10/10/2012 2:15 PM Krista Blue [facility] [phone] None   10/17/2012 2:15 PM Krista Blue [facility] [phone] None   10/24/2012 1:30 PM Beverely Pace Shumate [facility] [phone] None   10/24/2012 2:00 PM [facility] [phone] None   10/25/2012 11:00 AM [facility] Inj Nurse [facility] [phone] None   10/31/2012 2:15 PM Krista Blue [facility] [phone] None   11/30/2012 11:00 AM [facility] Delsa Grana [phone] [facility]     Future Orders Please Complete By Expires   Diet - low sodium heart healthy      Increase activity slowly      Call MD for:  persistant nausea and vomiting      Call MD for:  severe uncontrolled pain      Call MD for:  difficulty breathing, headache or visual disturbances      Call MD for:  persistant dizziness or light-headedness          Medication List     As of 09/15/2012  8:09 AM    TAKE these medications             Medication List     As of 09/15/2012 11:05 AM    START taking these medications         albuterol (2.5 MG/3ML) 0.083% nebulizer solution   Commonly known as: PROVENTIL   Take 3 mLs (2.5 mg total) by nebulization every 6 (six) hours as needed for wheezing.      levofloxacin 500 MG tablet   Commonly known as: LEVAQUIN   Take 1 tablet (500 mg total) by mouth daily.      predniSONE 50 MG tablet   Commonly known as: DELTASONE   Please take 50 mg daily for next 7 days      CHANGE how you take these medications         HYDROcodone-acetaminophen 5-500 MG per tablet   Commonly known as: VICODIN   Take 1-2 tablets by mouth every 6 (six) hours as needed for pain. For pain.   What changed: reasons to take the med      hydrOXYzine 25 MG tablet   Commonly known as: ATARAX/VISTARIL   Take 1 tablet (25 mg total) by mouth every 4 (four) hours as needed for itching.  Take 1 tablet by mouth every 8 hours as needed for itching   What changed: - dose - route (how to take the med) - how often to take the med - reasons to take the med      CONTINUE taking these medications         acetaminophen 500 MG tablet   Commonly known as: TYLENOL      amLODipine-benazepril 5-10 MG per capsule   Commonly known as: LOTREL      aspirin 81 MG tablet      dexamethasone 4 MG tablet   Commonly known as: DECADRON      glyBURIDE-metformin 5-500 MG per tablet   Commonly known as: GLUCOVANCE      lidocaine-prilocaine cream   Commonly known as: EMLA      morphine 30 MG 12  hr tablet   Commonly known as: MS CONTIN   Take 1 tablet (30 mg total) by mouth 2 (two) times daily.      pantoprazole 40 MG tablet   Commonly known as: PROTONIX   Take 1 tablet (40 mg total) by mouth 2 (two) times daily.      PRESCRIPTION MEDICATION          Where to get your medications    These are the prescriptions that you need to pick up.   You may get these medications from any pharmacy.         albuterol (2.5 MG/3ML) 0.083% nebulizer solution   HYDROcodone-acetaminophen 5-500 MG per tablet   hydrOXYzine 25 MG tablet   levofloxacin 500 MG tablet   morphine 30 MG 12 hr tablet   predniSONE 50 MG tablet                Follow-up Information    Follow up with AVBUERE,EDWIN A, MD. In 2 weeks. (If symptoms worsen)    Contact information:   [address] [place] [address] [phone]           The results of significant diagnostics from this hospitalization (including imaging, microbiology, ancillary and laboratory) are listed below for reference.    Significant Diagnostic Studies: Dg Chest 2 View  09/10/2012  *RADIOLOGY REPORT*  Clinical Data: Cough, shortness of breath.  CHEST - 2 VIEW  Comparison: 01/18/2012  Findings: Right Port-A-Cath remains in place, unchanged.  Low lung volumes.  Increasing left basilar opacity concerning for pneumonia. This may  represent atelectasis superimposed on chronic changes seen on prior CT.  Cannot exclude pneumonia.  Recommend clinical correlation. Heart is normal size.  There are low lung volumes.  No right effusion.  No acute bony abnormality.  IMPRESSION: Worsening left basilar opacity which may reflect atelectasis due to low lung volumes superimposed on chronic opacity, but cannot exclude pneumonia.  Recommend clinical correlation.  Small left pleural effusion.   Original Report Authenticated By: Cyndie Chime, M.D.    Mr Laqueta Jean Wo Contrast  08/27/2012  **ADDENDUM** CREATED: 08/27/2012 08:38:52  The area of gyriform enhancement involving the left temporal - occipital region is much more apparent on the current examination which may be partially technical in origin given the fact that the present exam was performed on a 3 Tesla magnet (versus prior 1.5 Tesla magnet).  Retrospectively there is minimal gyriform enhancement in this region on the prior examination.  To help determine if the three described areas represent tumor or infarct, the patient will require close follow-up MR imaging within the next 3 months.  The patient was presented at brain tumor board and these recommendations discussed (per Dr. Alfredo Batty).  **END ADDENDUM** SIGNED BY: Almedia Balls. Constance Goltz, M.D.   08/23/2012  *RADIOLOGY REPORT*  Clinical Data: Undergoing chemotherapy for non-small cell lung cancer.  Follow-up to prior MR.  MRI HEAD WITHOUT AND WITH CONTRAST  Technique:  Multiplanar, multiecho pulse sequences of the brain and surrounding structures were obtained according to standard protocol without and with intravenous contrast  Contrast: 18mL MULTIHANCE GADOBENATE DIMEGLUMINE 529 MG/ML IV SOLN  Comparison: 07/07/2012 and 02/04/2011.  Findings: Right superior cerebellar 6 x 4 x 5 mm area of enhancement has decreased in size now measuring 4 x 3 x 3 mm. This may represent result of infarction versus response to treatment of tumor.  Left frontal lobe gyriform  enhancement without change in size however more conspicuous associated blood breakdown products.  This may represent result of infarction rather than tumor.  New area of gyriform enhancement posterior left temporal - occipital region (series 11 images 13-15 and series 12 images 31 and 32). Questionable minimal restricted motion in this region raises possibility of acute/subacute small infarct.  Tumor not entirely excluded.  This can be assessed on follow-up.  Small bony lesions anterior frontal lobe paracentral position without significant change and may be related to arachnoid granulations rather metastatic disease.  Remote infarct with encephalomalacia left occipital / posterior left temporal lobe with peripheral blood breakdown products.  Prominent small vessel disease type changes.  Global atrophy without hydrocephalus.  Question tiny left parasagittal meningioma unchanged.  Major intracranial vascular structures are patent.  Mild exophthalmos.  IMPRESSION:  Right superior cerebellar 6 x 4 x 5 mm area of enhancement has decreased in size now measuring 4 x 3 x 3 mm. This may represent result of infarction versus response to treatment of tumor.  Left frontal lobe gyriform enhancement without change in size however more conspicuous associated blood breakdown products.  This may represent result of infarction rather than tumor.  New area of gyriform enhancement posterior left temporal - occipital region (series 11 images 13-15 and series 12 images 31 and 32). Questionable minimal restricted motion in this region raises possibility of acute/subacute small infarct.  Tumor not entirely excluded.  This can be assessed on follow-up.  This has been made a PRA call report utilizing dashboard call feature.   Original Report Authenticated By: Fuller Canada, M.D.     Microbiology: Recent Results (from the past 240 hour(s))  URINE CULTURE     Status: Normal   Collection Time   09/10/12  2:12 PM      Component Value  Range Status Comment   Specimen Description URINE, CLEAN CATCH   Final    Special Requests NONE   Final    Culture  Setup Time 09/11/2012 01:29   Final    Colony Count NO GROWTH   Final    Culture NO GROWTH   Final    Report Status 09/12/2012 FINAL   Final      Labs: Basic Metabolic Panel:  Lab 09/14/12 1478 09/12/12 0515 09/11/12 0525 09/10/12 1230  NA -- 138 139 137  K -- 4.7 4.2 4.0  CL -- 101 104 103  CO2 -- 29 28 25   GLUCOSE -- 68* 83 163*  BUN -- 7 10 12   CREATININE -- 0.91 0.83 0.88  CALCIUM -- 8.9 8.5 8.6  MG 1.6 -- 1.3* --  PHOS -- -- -- --   Liver Function Tests:  Lab 09/11/12 0525 09/10/12 1230  AST 14 14  ALT 8 9  ALKPHOS 65 75  BILITOT 0.2* 0.2*  PROT 5.5* 6.2  ALBUMIN 2.4* 2.7*   CBC:  Lab 09/12/12 0515 09/11/12 0525 09/10/12 1230  WBC 6.4 5.6 9.0  HGB 9.9* 9.0* 10.1*  HCT 33.4* 29.9* 32.9*  MCV 95.2 94.3 94.5  PLT 315 276 315   Cardiac Enzymes:  Lab 09/10/12 1230  CKTOTAL --  CKMB --  CKMBINDEX --  TROPONINI <0.30   BNP: BNP (last 3 results)  Basename 09/10/12 1230 01/18/12 1020  PROBNP 125.6 68.7   CBG:  Lab 09/15/12 0734 09/15/12 0103 09/14/12 2057 09/14/12 1712 09/14/12 1225  GLUCAP 163* 131* 143* 173* 155*    Time coordinating discharge: Over 30 minutes  Signed:  Manson Passey, MD  [facility] 09/15/2012, 8:09 AM  Pager #: [phone]

## 2012-09-15 NOTE — Care Management Note (Signed)
    Page 1 of 1   09/15/2012     5:09:06 PM   CARE MANAGEMENT NOTE 09/15/2012  Patient:  Johnathan Bowman, Johnathan Bowman   Account Number:  [id number]  Date Initiated:  09/14/2012  Documentation initiated by:  [facility]  Subjective/Objective Assessment:   78 year old male admitted with hematuria.     Action/Plan:   Pt will be d/c home with HHPT/RN services. He lives with his spouse.   Anticipated DC Date:  09/17/2012   Anticipated DC Plan:  HOME W HOME HEALTH SERVICES         Choice offered to / List presented to:  C-1 Patient   DME arranged  [facility]      DME agency  [facility]     [facility] arranged  HH-1 RN  HH-2 PT      [facility] agency  [facility]   Status of service:  Completed, signed off Medicare Important Message given?   (If response is "NO", the following Medicare IM given date fields will be blank) Date Medicare IM given:   Date Additional Medicare IM given:    Discharge Disposition:  HOME W HOME HEALTH SERVICES  Per UR Regulation:  Reviewed for med. necessity/level of care/duration of stay  If discussed at Long Length of Stay Meetings, dates discussed:    Comments:  09/15/2012  Konrad Felix RN, case manager   [phone] Consulted per new diagnosis and need to set up home O2. Retrieved oxygenation data and supplied the information to [facility] who agreed to set up the home O2.

## 2012-09-15 NOTE — Progress Notes (Signed)
Pt noted to be wheezing to both L & R lobe.O2 sats on RA were running on 60's-70's. O2 sats up to 80's with deep breathing exercises. O2 at 2lpm reapplied per [place]. RT called to administer Albuterol inhaler as ordered prn. O2 remained on low 90's after breathing tx. MD called & notified of above assessments. MD states will order Albuterol, Steroid, Levaquin & O2 for home use. MD went ahead and called in new meds to pt's pharmacy-[facility]. Pt notified of new med orders including O2 for home use. Case manager paged to set-up O2 for home use. SW made aware of need for transport to home since pt's wife is apparently sick & nobody will be able to pick him up. Will d/c pt after IV ABT completed this pm. Will continue to monitor status.

## 2012-09-19 ENCOUNTER — Telehealth: Payer: Self-pay | Admitting: Internal Medicine

## 2012-09-19 ENCOUNTER — Other Ambulatory Visit (HOSPITAL_BASED_OUTPATIENT_CLINIC_OR_DEPARTMENT_OTHER): Payer: Medicare Other | Admitting: Lab

## 2012-09-19 ENCOUNTER — Other Ambulatory Visit: Payer: Self-pay | Admitting: Physician Assistant

## 2012-09-19 DIAGNOSIS — C3490 Malignant neoplasm of unspecified part of unspecified bronchus or lung: Secondary | ICD-10-CM

## 2012-09-19 LAB — COMPREHENSIVE METABOLIC PANEL (CC13)
ALT: 11 U/L (ref 0–55)
Albumin: 3.2 g/dL — ABNORMAL LOW (ref 3.5–5.0)
Alkaline Phosphatase: 69 U/L (ref 40–150)
CO2: 24 mEq/L (ref 22–29)
Glucose: 222 mg/dl — ABNORMAL HIGH (ref 70–99)
Potassium: 4.5 mEq/L (ref 3.5–5.1)
Sodium: 138 mEq/L (ref 136–145)
Total Bilirubin: 0.3 mg/dL (ref 0.20–1.20)
Total Protein: 6.4 g/dL (ref 6.4–8.3)

## 2012-09-19 LAB — CBC WITH DIFFERENTIAL/PLATELET
BASO%: 0.1 % (ref 0.0–2.0)
Eosinophils Absolute: 0 10*3/uL (ref 0.0–0.5)
LYMPH%: 4.3 % — ABNORMAL LOW (ref 14.0–49.0)
MCHC: 32.5 g/dL (ref 32.0–36.0)
MONO#: 0.1 10*3/uL (ref 0.1–0.9)
MONO%: 1.2 % (ref 0.0–14.0)
NEUT#: 8.1 10*3/uL — ABNORMAL HIGH (ref 1.5–6.5)
Platelets: 378 10*3/uL (ref 140–400)
RBC: 3.65 10*6/uL — ABNORMAL LOW (ref 4.20–5.82)
RDW: 17.4 % — ABNORMAL HIGH (ref 11.0–14.6)
WBC: 8.6 10*3/uL (ref 4.0–10.3)

## 2012-09-19 NOTE — Telephone Encounter (Signed)
Pt came by to change all appts to AM, I noticed that patient do not have a visit, pt informed me that he was in hospital and failed to show up on 09/12/12, asked ML if patient needs to be seen, she put in POF for 10/16 to see MD with lab and chemo, gave patient new appt calendar

## 2012-09-26 ENCOUNTER — Other Ambulatory Visit (HOSPITAL_BASED_OUTPATIENT_CLINIC_OR_DEPARTMENT_OTHER): Payer: Medicare Other | Admitting: Lab

## 2012-09-26 DIAGNOSIS — C3490 Malignant neoplasm of unspecified part of unspecified bronchus or lung: Secondary | ICD-10-CM

## 2012-09-26 DIAGNOSIS — C3410 Malignant neoplasm of upper lobe, unspecified bronchus or lung: Secondary | ICD-10-CM

## 2012-09-26 LAB — COMPREHENSIVE METABOLIC PANEL (CC13)
ALT: 8 U/L (ref 0–55)
AST: 7 U/L (ref 5–34)
Alkaline Phosphatase: 69 U/L (ref 40–150)
Potassium: 3.7 mEq/L (ref 3.5–5.1)
Sodium: 139 mEq/L (ref 136–145)
Total Bilirubin: 0.4 mg/dL (ref 0.20–1.20)
Total Protein: 6.3 g/dL — ABNORMAL LOW (ref 6.4–8.3)

## 2012-09-26 LAB — CBC WITH DIFFERENTIAL/PLATELET
BASO%: 0.1 % (ref 0.0–2.0)
EOS%: 2 % (ref 0.0–7.0)
LYMPH%: 11.6 % — ABNORMAL LOW (ref 14.0–49.0)
MCHC: 29.8 g/dL — ABNORMAL LOW (ref 32.0–36.0)
MCV: 94.9 fL (ref 79.3–98.0)
MONO%: 6.9 % (ref 0.0–14.0)
Platelets: 259 10*3/uL (ref 140–400)
RBC: 4.1 10*6/uL — ABNORMAL LOW (ref 4.20–5.82)
RDW: 15.8 % — ABNORMAL HIGH (ref 11.0–14.6)

## 2012-10-03 ENCOUNTER — Ambulatory Visit (HOSPITAL_BASED_OUTPATIENT_CLINIC_OR_DEPARTMENT_OTHER): Payer: Medicare Other

## 2012-10-03 ENCOUNTER — Telehealth: Payer: Self-pay | Admitting: Internal Medicine

## 2012-10-03 ENCOUNTER — Ambulatory Visit (HOSPITAL_BASED_OUTPATIENT_CLINIC_OR_DEPARTMENT_OTHER): Payer: Medicare Other | Admitting: Internal Medicine

## 2012-10-03 ENCOUNTER — Other Ambulatory Visit (HOSPITAL_BASED_OUTPATIENT_CLINIC_OR_DEPARTMENT_OTHER): Payer: Medicare Other | Admitting: Lab

## 2012-10-03 VITALS — BP 113/72 | HR 83 | Temp 97.6°F | Resp 20 | Ht 65.0 in | Wt 186.4 lb

## 2012-10-03 DIAGNOSIS — C3410 Malignant neoplasm of upper lobe, unspecified bronchus or lung: Secondary | ICD-10-CM

## 2012-10-03 DIAGNOSIS — Z5111 Encounter for antineoplastic chemotherapy: Secondary | ICD-10-CM

## 2012-10-03 DIAGNOSIS — C3490 Malignant neoplasm of unspecified part of unspecified bronchus or lung: Secondary | ICD-10-CM

## 2012-10-03 DIAGNOSIS — R0602 Shortness of breath: Secondary | ICD-10-CM

## 2012-10-03 DIAGNOSIS — M549 Dorsalgia, unspecified: Secondary | ICD-10-CM

## 2012-10-03 DIAGNOSIS — C801 Malignant (primary) neoplasm, unspecified: Secondary | ICD-10-CM

## 2012-10-03 LAB — CBC WITH DIFFERENTIAL/PLATELET
BASO%: 0.4 % (ref 0.0–2.0)
EOS%: 2.7 % (ref 0.0–7.0)
HCT: 38.1 % — ABNORMAL LOW (ref 38.4–49.9)
LYMPH%: 14.5 % (ref 14.0–49.0)
MCH: 28.9 pg (ref 27.2–33.4)
MCHC: 30.4 g/dL — ABNORMAL LOW (ref 32.0–36.0)
MONO#: 0.7 10*3/uL (ref 0.1–0.9)
NEUT%: 73.3 % (ref 39.0–75.0)
Platelets: 208 10*3/uL (ref 140–400)
RBC: 4.02 10*6/uL — ABNORMAL LOW (ref 4.20–5.82)
WBC: 7.7 10*3/uL (ref 4.0–10.3)
lymph#: 1.1 10*3/uL (ref 0.9–3.3)
nRBC: 0 % (ref 0–0)

## 2012-10-03 LAB — COMPREHENSIVE METABOLIC PANEL (CC13)
ALT: 11 U/L (ref 0–55)
AST: 11 U/L (ref 5–34)
Alkaline Phosphatase: 76 U/L (ref 40–150)
BUN: 17 mg/dL (ref 7.0–26.0)
Chloride: 107 mEq/L (ref 98–107)
Creatinine: 1.1 mg/dL (ref 0.7–1.3)

## 2012-10-03 MED ORDER — HEPARIN SOD (PORK) LOCK FLUSH 100 UNIT/ML IV SOLN
500.0000 [IU] | Freq: Once | INTRAVENOUS | Status: AC | PRN
  Administered 2012-10-03: 500 [IU]
  Filled 2012-10-03: qty 5

## 2012-10-03 MED ORDER — ONDANSETRON 8 MG/50ML IVPB (CHCC)
8.0000 mg | Freq: Once | INTRAVENOUS | Status: AC
  Administered 2012-10-03: 8 mg via INTRAVENOUS

## 2012-10-03 MED ORDER — DOCETAXEL CHEMO INJECTION 160 MG/16ML
75.0000 mg/m2 | Freq: Once | INTRAVENOUS | Status: AC
  Administered 2012-10-03: 150 mg via INTRAVENOUS
  Filled 2012-10-03: qty 15

## 2012-10-03 MED ORDER — SODIUM CHLORIDE 0.9 % IJ SOLN
10.0000 mL | INTRAMUSCULAR | Status: DC | PRN
  Administered 2012-10-03: 10 mL
  Filled 2012-10-03: qty 10

## 2012-10-03 MED ORDER — SODIUM CHLORIDE 0.9 % IV SOLN
Freq: Once | INTRAVENOUS | Status: AC
  Administered 2012-10-03: 12:00:00 via INTRAVENOUS

## 2012-10-03 MED ORDER — DEXAMETHASONE SODIUM PHOSPHATE 10 MG/ML IJ SOLN
10.0000 mg | Freq: Once | INTRAMUSCULAR | Status: AC
  Administered 2012-10-03: 10 mg via INTRAVENOUS

## 2012-10-03 NOTE — Telephone Encounter (Signed)
Printed and gv appt schedule to pt for OCt and NOV

## 2012-10-03 NOTE — Patient Instructions (Signed)
Continue treatment as scheduled. Followup in 3 weeks with repeat CT scan of the chest, abdomen and pelvis

## 2012-10-03 NOTE — Progress Notes (Signed)
[facility] [phone]   [phone]  OFFICE PROGRESS NOTE  Dorrene German, MD [address] [place] [address]  DIAGNOSIS: Metastatic non-small cell lung cancer, adenocarcinoma, diagnosed in June 2012.   PRIOR THERAPY:  1. Status post left Pleurx catheter placement by Dr. Edwyna Shell on February 08, 2011 for drainage of left sided pleural effusion. 2. Status post 4 cycles of induction chemotherapy with carboplatin for AUC of 6, paclitaxel 200 mg per meter square and Avastin 15 mg/kg given every 3 weeks according to the ECOG protocol 5508 with stable disease. 3. Status post 1 cycle of maintenance Avastin 15 mg/kg given on May 19, 2011, discontinued secondary to gastrointestinal bleed. 4. Maintenance chemotherapy with Alimta 500 mg/m2 every 3 weeks. Status post 9 cycles, discontinued today secondary to disease progression.   CURRENT THERAPY: systemic chemotherapy with docetaxel 75 mg/M2 every 3 weeks with Neulasta support. Status post 5 cycles.  INTERVAL HISTORY: Johnathan Bowman 78 y.o. male returns to the clinic today for followup visit accompanied by his wife. The patient is feeling fine today with no specific complaints except for pain from his previous history of shingles in the back. He denied having any significant chest pain but continues to have shortness breath with exertion, no cough or hemoptysis. He denied having any significant weight loss or night sweats. He is tolerating his treatment with docetaxel fairly well.  MEDICAL HISTORY: Past Medical History  Diagnosis Date  . Hypertension   . Diabetes mellitus   . Dyslipidemia   . Mini stroke 2005  . Degenerative disc disease   . GERD (gastroesophageal reflux disease)   . Osteopenia 2005  . Coronary artery disease 2005    treated medically  . BPH (benign prostatic hyperplasia)   . DVT (deep venous thrombosis)   . History of chemotherapy   . Diverticulosis     per scan 05/28/12  .  Calculus of kidney     per scan 05/28/12  . Hx of radiation therapy 06/14/12 -07/05/12    left chest  . lung ca 11/12/2010    L[facility]    ALLERGIES:  is allergic to penicillins.  MEDICATIONS:  Current Outpatient Prescriptions  Medication Sig Dispense Refill  . acetaminophen (TYLENOL) 500 MG tablet Take 500 mg by mouth every 6 (six) hours as needed. For pain.      Marland Kitchen albuterol (PROVENTIL) (2.5 MG/3ML) 0.083% nebulizer solution Take 3 mLs (2.5 mg total) by nebulization every 6 (six) hours as needed for wheezing.  75 mL  12  . amLODipine-benazepril (LOTREL) 5-10 MG per capsule Take 1 capsule by mouth daily.        Marland Kitchen aspirin 81 MG tablet Take 81 mg by mouth daily.       Marland Kitchen dexamethasone (DECADRON) 4 MG tablet Take 4 mg by mouth 2 (two) times daily with a meal. 2 tabs by mouth BID, the day before, the day of and the day after chemotherapy      . glyBURIDE-metformin (GLUCOVANCE) 5-500 MG per tablet Take 2 tablets by mouth 2 (two) times daily.        Marland Kitchen HYDROcodone-acetaminophen (VICODIN) 5-500 MG per tablet Take 1-2 tablets by mouth every 6 (six) hours as needed for pain. For pain.  60 tablet  0  . hydrOXYzine (ATARAX/VISTARIL) 25 MG tablet Take 1 tablet (25 mg total) by mouth every 4 (four) hours as needed for itching. Take 1 tablet by mouth every 8 hours as needed for itching  60 tablet  0  . lidocaine-prilocaine (EMLA) cream Apply 1 application topically as needed. Apply to [facility] Cath site before chemotherapy.       Marland Kitchen morphine (MS CONTIN) 30 MG 12 hr tablet Take 1 tablet (30 mg total) by mouth 2 (two) times daily.  60 tablet  0  . pantoprazole (PROTONIX) 40 MG tablet Take 1 tablet (40 mg total) by mouth 2 (two) times daily.  30 tablet  3  . PRESCRIPTION MEDICATION Chemo=last dose 2 weeks ago. Next dose in 1 week. Dr Tanja Port        SURGICAL HISTORY:  Past Surgical History  Procedure Date  . No past surgeries   . Pleurex cath 02/08/11    left-sided pleural effusion    REVIEW OF SYSTEMS:  A  comprehensive review of systems was negative except for: Constitutional: positive for fatigue Respiratory: positive for dyspnea on exertion   PHYSICAL EXAMINATION: General appearance: alert, cooperative and no distress Head: Normocephalic, without obvious abnormality, atraumatic Neck: no adenopathy Lymph nodes: Cervical, supraclavicular, and axillary nodes normal. Resp: clear to auscultation bilaterally Cardio: regular rate and rhythm, S1, S2 normal, no murmur, click, rub or gallop GI: soft, non-tender; bowel sounds normal; no masses,  no organomegaly Extremities: extremities normal, atraumatic, no cyanosis or edema Neurologic: Alert and oriented X 3, normal strength and tone. Normal symmetric reflexes. Normal coordination and gait  ECOG PERFORMANCE STATUS: 1 - Symptomatic but completely ambulatory  Blood pressure 113/72, pulse 83, temperature 97.6 F (36.4 C), temperature source Oral, resp. rate 20, height 5\' 5"  (1.651 m), weight 186 lb 6.4 oz (84.55 kg).  LABORATORY DATA: Lab Results  Component Value Date   WBC 7.7 10/03/2012   HGB 11.6* 10/03/2012   HCT 38.1* 10/03/2012   MCV 94.8 10/03/2012   PLT 208 10/03/2012      Chemistry      Component Value Date/Time   NA 139 09/26/2012 0904   NA 138 09/12/2012 0515   NA 139 11/07/2011 1621   K 3.7 09/26/2012 0904   K 4.7 09/12/2012 0515   K 4.8* 11/07/2011 1621   CL 105 09/26/2012 0904   CL 101 09/12/2012 0515   CL 97* 11/07/2011 1621   CO2 20* 09/26/2012 0904   CO2 29 09/12/2012 0515   CO2 30 11/07/2011 1621   BUN 13.0 09/26/2012 0904   BUN 7 09/12/2012 0515   BUN 11 11/07/2011 1621   CREATININE 1.0 09/26/2012 0904   CREATININE 0.91 09/12/2012 0515   CREATININE 0.9 11/07/2011 1621      Component Value Date/Time   CALCIUM 9.0 09/26/2012 0904   CALCIUM 8.9 09/12/2012 0515   CALCIUM 9.4 11/07/2011 1621   ALKPHOS 69 09/26/2012 0904   ALKPHOS 65 09/11/2012 0525   ALKPHOS 100* 11/07/2011 1621   AST 7 09/26/2012 0904   AST 14 09/11/2012  0525   AST 21 11/07/2011 1621   ALT 8 09/26/2012 0904   ALT 8 09/11/2012 0525   BILITOT 0.40 09/26/2012 0904   BILITOT 0.2* 09/11/2012 0525   BILITOT 0.50 11/07/2011 1621       RADIOGRAPHIC STUDIES: Dg Chest 2 View  09/10/2012  *RADIOLOGY REPORT*  Clinical Data: Cough, shortness of breath.  CHEST - 2 VIEW  Comparison: 01/18/2012  Findings: Right Port-A-Cath remains in place, unchanged.  Low lung volumes.  Increasing left basilar opacity concerning for pneumonia. This may represent atelectasis superimposed on chronic changes seen on prior CT.  Cannot exclude pneumonia.  Recommend clinical correlation. Heart  is normal size.  There are low lung volumes.  No right effusion.  No acute bony abnormality.  IMPRESSION: Worsening left basilar opacity which may reflect atelectasis due to low lung volumes superimposed on chronic opacity, but cannot exclude pneumonia.  Recommend clinical correlation.  Small left pleural effusion.   Original Report Authenticated By: Cyndie Chime, M.D.     ASSESSMENT: This is a very pleasant 78 years old Hispanic male with history of metastatic non-small cell lung cancer, adenocarcinoma currently undergoing systemic chemotherapy with single agent docetaxel 75 mg/M2 every 3 weeks with Neulasta support. The patient is status post 5 cycles. He is doing fine and tolerating his treatment fairly well.  PLAN: We will proceed with cycle #6 today as scheduled. I would see the patient back for followup visit in 3 weeks with repeat CT scan of the chest, abdomen and pelvis. He was advised to call me immediately if he has any concerning symptoms in the interval. The patient would contact his primary care physician for treatment of the herpes zoster in his back if no improvement in the next few weeks.  All questions were answered. The patient knows to call the clinic with any problems, questions or concerns. We can certainly see the patient much sooner if necessary.  I spent 15 minutes  counseling the patient face to face. The total time spent in the appointment was 25 minutes.

## 2012-10-03 NOTE — Patient Instructions (Signed)
[facility] Discharge Instructions for Patients Receiving Chemotherapy  Today you received the following chemotherapy agents Taxotere To help prevent nausea and vomiting after your treatment, we encourage you to take your nausea medication as prescribed.  If you develop nausea and vomiting that is not controlled by your nausea medication, call the clinic. If it is after clinic hours your family physician or the after hours number for the clinic or go to the Emergency Department.   BELOW ARE SYMPTOMS THAT SHOULD BE REPORTED IMMEDIATELY:  *FEVER GREATER THAN 100.5 F  *CHILLS WITH OR WITHOUT FEVER  NAUSEA AND VOMITING THAT IS NOT CONTROLLED WITH YOUR NAUSEA MEDICATION  *UNUSUAL SHORTNESS OF BREATH  *UNUSUAL BRUISING OR BLEEDING  TENDERNESS IN MOUTH AND THROAT WITH OR WITHOUT PRESENCE OF ULCERS  *URINARY PROBLEMS  *BOWEL PROBLEMS  UNUSUAL RASH Items with * indicate a potential emergency and should be followed up as soon as possible.  One of the nurses will contact you 24 hours after your treatment. Please let the nurse know about any problems that you may have experienced. Feel free to call the clinic you have any questions or concerns. The clinic phone number is [phone].   I have been informed and understand all the instructions given to me. I know to contact the clinic, my physician, or go to the Emergency Department if any problems should occur. I do not have any questions at this time, but understand that I may call the clinic during office hours   should I have any questions or need assistance in obtaining follow up care.    __________________________________________  _____________  __________ Signature of Patient or Authorized Representative            Date                   Time    __________________________________________ Nurse's Signature    

## 2012-10-03 NOTE — Telephone Encounter (Signed)
Per staff phone call I have adjusted 11/6 appt. JWM

## 2012-10-03 NOTE — Telephone Encounter (Signed)
Printed and gv pt appt for Oct and NOv

## 2012-10-04 ENCOUNTER — Ambulatory Visit (HOSPITAL_BASED_OUTPATIENT_CLINIC_OR_DEPARTMENT_OTHER): Payer: Medicare Other

## 2012-10-04 ENCOUNTER — Telehealth: Payer: Self-pay | Admitting: *Deleted

## 2012-10-04 VITALS — BP 130/69 | HR 113 | Temp 98.3°F

## 2012-10-04 DIAGNOSIS — C3490 Malignant neoplasm of unspecified part of unspecified bronchus or lung: Secondary | ICD-10-CM

## 2012-10-04 DIAGNOSIS — C3410 Malignant neoplasm of upper lobe, unspecified bronchus or lung: Secondary | ICD-10-CM

## 2012-10-04 DIAGNOSIS — C801 Malignant (primary) neoplasm, unspecified: Secondary | ICD-10-CM

## 2012-10-04 MED ORDER — PEGFILGRASTIM INJECTION 6 MG/0.6ML
6.0000 mg | Freq: Once | SUBCUTANEOUS | Status: AC
  Administered 2012-10-04: 6 mg via SUBCUTANEOUS
  Filled 2012-10-04: qty 0.6

## 2012-10-04 NOTE — Telephone Encounter (Signed)
Called patient because he didn't come for his injection this am.  Spoke to patient and he is coming in this morning.

## 2012-10-09 NOTE — Progress Notes (Signed)
Received call from Physical Therapist, @ [facility]) stating that upon her arrival to patient's home today, he reported nausea/vomiting and abdominal pain in right lower quadrant (firm and tender to the touch); stated that nurse from [facility] to come out to assess patient asap; she also informed patient and family that if the pain should become more severe, not to wait for home care nurse and report to ED; states that patient/family verbalized understanding; attempted to call patient to check on status, but unable to reach him; will continue to f/u today.

## 2012-10-09 NOTE — Progress Notes (Signed)
Attempted to contact patient to check on status; called all contact numbers in patient's chart; left voice message with daughter (Lomalee----[phone]) to call office if she has any questions or concerns.

## 2012-10-10 ENCOUNTER — Other Ambulatory Visit: Payer: Medicare Other | Admitting: Lab

## 2012-10-17 ENCOUNTER — Other Ambulatory Visit (HOSPITAL_BASED_OUTPATIENT_CLINIC_OR_DEPARTMENT_OTHER): Payer: Medicare Other | Admitting: Lab

## 2012-10-17 ENCOUNTER — Telehealth: Payer: Self-pay | Admitting: Medical Oncology

## 2012-10-17 ENCOUNTER — Ambulatory Visit (HOSPITAL_COMMUNITY)
Admission: RE | Admit: 2012-10-17 | Discharge: 2012-10-17 | Disposition: A | Discharge status: Home / Self Care | Payer: Medicare Other | Source: Ambulatory Visit | Attending: Internal Medicine | Admitting: Internal Medicine

## 2012-10-17 ENCOUNTER — Ambulatory Visit (HOSPITAL_BASED_OUTPATIENT_CLINIC_OR_DEPARTMENT_OTHER): Payer: Medicare Other | Admitting: Lab

## 2012-10-17 ENCOUNTER — Other Ambulatory Visit: Payer: Self-pay | Admitting: Medical Oncology

## 2012-10-17 DIAGNOSIS — C3490 Malignant neoplasm of unspecified part of unspecified bronchus or lung: Secondary | ICD-10-CM | POA: Insufficient documentation

## 2012-10-17 DIAGNOSIS — E162 Hypoglycemia, unspecified: Secondary | ICD-10-CM

## 2012-10-17 DIAGNOSIS — R599 Enlarged lymph nodes, unspecified: Secondary | ICD-10-CM | POA: Insufficient documentation

## 2012-10-17 LAB — COMPREHENSIVE METABOLIC PANEL (CC13)
ALT: 17 U/L (ref 0–55)
AST: 18 U/L (ref 5–34)
Alkaline Phosphatase: 87 U/L (ref 40–150)
Potassium: 3.9 mEq/L (ref 3.5–5.1)
Sodium: 143 mEq/L (ref 136–145)
Total Bilirubin: 0.29 mg/dL (ref 0.20–1.20)
Total Protein: 5.7 g/dL — ABNORMAL LOW (ref 6.4–8.3)

## 2012-10-17 LAB — WHOLE BLOOD GLUCOSE
Glucose: 60 mg/dL — ABNORMAL LOW (ref 70–100)
HRS PC: 0.5 Hours

## 2012-10-17 LAB — CBC WITH DIFFERENTIAL/PLATELET
BASO%: 0.2 % (ref 0.0–2.0)
EOS%: 0.1 % (ref 0.0–7.0)
LYMPH%: 5.9 % — ABNORMAL LOW (ref 14.0–49.0)
MCHC: 31.9 g/dL — ABNORMAL LOW (ref 32.0–36.0)
MCV: 91.3 fL (ref 79.3–98.0)
MONO%: 5.2 % (ref 0.0–14.0)
Platelets: 311 10*3/uL (ref 140–400)
RBC: 3.61 10*6/uL — ABNORMAL LOW (ref 4.20–5.82)
RDW: 16.3 % — ABNORMAL HIGH (ref 11.0–14.6)

## 2012-10-17 MED ORDER — IOHEXOL 300 MG/ML  SOLN
100.0000 mL | Freq: Once | INTRAMUSCULAR | Status: AC | PRN
  Administered 2012-10-17: 100 mL via INTRAVENOUS

## 2012-10-17 NOTE — Telephone Encounter (Signed)
Repeat blood glucose =60 -pt notified and sent home with instructions on diabetic diet - he cannot take his oral diabetic meds for 2 days. He voices understanding,

## 2012-10-17 NOTE — Telephone Encounter (Signed)
Called pt with blood glucose -he just got through with CT scan . I met pt in ED lobby where he was waiting for his car . He was asymptomatic. I  gave him OJ and 4 peanut butter crackers and sent him back to cancer center for repeat glucose.

## 2012-10-24 ENCOUNTER — Ambulatory Visit: Payer: Medicare Other

## 2012-10-24 ENCOUNTER — Ambulatory Visit (HOSPITAL_BASED_OUTPATIENT_CLINIC_OR_DEPARTMENT_OTHER): Payer: Medicare Other | Admitting: Internal Medicine

## 2012-10-24 ENCOUNTER — Telehealth: Payer: Self-pay | Admitting: Internal Medicine

## 2012-10-24 ENCOUNTER — Other Ambulatory Visit (HOSPITAL_BASED_OUTPATIENT_CLINIC_OR_DEPARTMENT_OTHER): Payer: Medicare Other | Admitting: Lab

## 2012-10-24 VITALS — BP 133/91 | HR 115 | Temp 98.8°F | Resp 20 | Ht 65.0 in | Wt 185.1 lb

## 2012-10-24 DIAGNOSIS — R0602 Shortness of breath: Secondary | ICD-10-CM

## 2012-10-24 DIAGNOSIS — C3490 Malignant neoplasm of unspecified part of unspecified bronchus or lung: Secondary | ICD-10-CM

## 2012-10-24 DIAGNOSIS — C3410 Malignant neoplasm of upper lobe, unspecified bronchus or lung: Secondary | ICD-10-CM

## 2012-10-24 LAB — CBC WITH DIFFERENTIAL/PLATELET
BASO%: 0.4 % (ref 0.0–2.0)
Basophils Absolute: 0 10*3/uL (ref 0.0–0.1)
EOS%: 0.3 % (ref 0.0–7.0)
HCT: 38.3 % — ABNORMAL LOW (ref 38.4–49.9)
HGB: 11.7 g/dL — ABNORMAL LOW (ref 13.0–17.1)
MCH: 28.2 pg (ref 27.2–33.4)
MCHC: 30.5 g/dL — ABNORMAL LOW (ref 32.0–36.0)
MCV: 92.3 fL (ref 79.3–98.0)
MONO%: 12.5 % (ref 0.0–14.0)
NEUT%: 70.7 % (ref 39.0–75.0)
RDW: 15.9 % — ABNORMAL HIGH (ref 11.0–14.6)

## 2012-10-24 NOTE — Progress Notes (Signed)
[facility] [phone]   [phone]  OFFICE PROGRESS NOTE  Dorrene [place], MD [address] [place] [address]  DIAGNOSIS: Metastatic non-small cell lung cancer, adenocarcinoma, diagnosed in June 2012.   PRIOR THERAPY:  1. Status post left Pleurx catheter placement by Dr. Edwyna Shell on February 08, 2011 for drainage of left sided pleural effusion. 2. Status post 4 cycles of induction chemotherapy with carboplatin for AUC of 6, paclitaxel 200 mg per meter square and Avastin 15 mg/kg given every 3 weeks according to the ECOG protocol 5508 with stable disease. 3. Status post 1 cycle of maintenance Avastin 15 mg/kg given on May 19, 2011, discontinued secondary to gastrointestinal bleed. 4. Maintenance chemotherapy with Alimta 500 mg/m2 every 3 weeks. Status post 9 cycles, discontinued today secondary to disease progression.   CURRENT THERAPY: systemic chemotherapy with docetaxel 75 mg/M2 every 3 weeks with Neulasta support. Status post 6 cycles, last dose was given 10/03/2012 with stable disease.   INTERVAL HISTORY: Johnathan Bowman 78 y.o. male returns to the clinic today for followup visit. The patient is feeling fine today except for fatigue and watery eyes. He denied having any significant chest pain but continues to have shortness breath with exertion, and mild without cough but no hemoptysis. He denied having any significant weight loss or night sweats. He tolerated the last cycle of his systemic chemotherapy with docetaxel fairly well. The patient denied having any gastrointestinal issues except for 2 days of diarrhea last week which completely resolved and he is back to normal bowel movement. He has repeat CT scan of the chest, abdomen and pelvis performed recently and he is here today for evaluation and discussion of his scan results.  MEDICAL HISTORY: Past Medical History  Diagnosis Date  . Hypertension   . Diabetes mellitus   .  Dyslipidemia   . Mini stroke 2005  . Degenerative disc disease   . GERD (gastroesophageal reflux disease)   . Osteopenia 2005  . Coronary artery disease 2005    treated medically  . BPH (benign prostatic hyperplasia)   . DVT (deep venous thrombosis)   . History of chemotherapy   . Diverticulosis     per scan 05/28/12  . Calculus of kidney     per scan 05/28/12  . Hx of radiation therapy 06/14/12 -07/05/12    left chest  . lung ca 11/12/2010    L[facility]    ALLERGIES:  is allergic to penicillins.  MEDICATIONS:  Current Outpatient Prescriptions  Medication Sig Dispense Refill  . acetaminophen (TYLENOL) 500 MG tablet Take 500 mg by mouth every 6 (six) hours as needed. For pain.      Marland Kitchen albuterol (PROVENTIL) (2.5 MG/3ML) 0.083% nebulizer solution Take 3 mLs (2.5 mg total) by nebulization every 6 (six) hours as needed for wheezing.  75 mL  12  . amLODipine-benazepril (LOTREL) 5-10 MG per capsule Take 1 capsule by mouth daily.        Marland Kitchen aspirin 81 MG tablet Take 81 mg by mouth daily.       Marland Kitchen dexamethasone (DECADRON) 4 MG tablet Take 4 mg by mouth 2 (two) times daily with a meal. 2 tabs by mouth BID, the day before, the day of and the day after chemotherapy      . glyBURIDE-metformin (GLUCOVANCE) 5-500 MG per tablet Take 2 tablets by mouth 2 (two) times daily.        Marland Kitchen HYDROcodone-acetaminophen (VICODIN) 5-500 MG per tablet Take  1-2 tablets by mouth every 6 (six) hours as needed for pain. For pain.  60 tablet  0  . hydrOXYzine (ATARAX/VISTARIL) 25 MG tablet Take 1 tablet (25 mg total) by mouth every 4 (four) hours as needed for itching. Take 1 tablet by mouth every 8 hours as needed for itching  60 tablet  0  . lidocaine-prilocaine (EMLA) cream Apply 1 application topically as needed. Apply to [facility] A Cath site before chemotherapy.       Marland Kitchen morphine (MS CONTIN) 30 MG 12 hr tablet Take 1 tablet (30 mg total) by mouth 2 (two) times daily.  60 tablet  0  . pantoprazole (PROTONIX) 40 MG tablet Take 1  tablet (40 mg total) by mouth 2 (two) times daily.  30 tablet  3  . PRESCRIPTION MEDICATION Chemo=last dose 2 weeks ago. Next dose in 1 week. Dr Tanja Port        SURGICAL HISTORY:  Past Surgical History  Procedure Date  . No past surgeries   . Pleurex cath 02/08/11    left-sided pleural effusion    REVIEW OF SYSTEMS:  A comprehensive review of systems was negative except for: Constitutional: positive for fatigue Respiratory: positive for cough, dyspnea on exertion and sputum   PHYSICAL EXAMINATION: General appearance: alert, cooperative and no distress Head: Normocephalic, without obvious abnormality, atraumatic Neck: no adenopathy Lymph nodes: Cervical, supraclavicular, and axillary nodes normal. Resp: clear to auscultation bilaterally Cardio: regular rate and rhythm, S1, S2 normal, no murmur, click, rub or gallop GI: soft, non-tender; bowel sounds normal; no masses,  no organomegaly Extremities: extremities normal, atraumatic, no cyanosis or edema Neurologic: Alert and oriented X 3, normal strength and tone. Normal symmetric reflexes. Normal coordination and gait  ECOG PERFORMANCE STATUS: 1 - Symptomatic but completely ambulatory  Blood pressure 133/91, pulse 115, temperature 98.8 F (37.1 C), temperature source Oral, resp. rate 20, height 5\' 5"  (1.651 m), weight 185 lb 1.6 oz (83.961 kg).  LABORATORY DATA: Lab Results  Component Value Date   WBC 7.7 10/24/2012   HGB 11.7* 10/24/2012   HCT 38.3* 10/24/2012   MCV 92.3 10/24/2012   PLT 383 10/24/2012      Chemistry      Component Value Date/Time   NA 143 10/17/2012 0956   NA 138 09/12/2012 0515   NA 139 11/07/2011 1621   K 3.9 10/17/2012 0956   K 4.7 09/12/2012 0515   K 4.8* 11/07/2011 1621   CL 105 10/17/2012 0956   CL 101 09/12/2012 0515   CL 97* 11/07/2011 1621   CO2 30* 10/17/2012 0956   CO2 29 09/12/2012 0515   CO2 30 11/07/2011 1621   BUN 8.0 10/17/2012 0956   BUN 7 09/12/2012 0515   BUN 11 11/07/2011 1621    CREATININE 0.8 10/17/2012 0956   CREATININE 0.91 09/12/2012 0515   CREATININE 0.9 11/07/2011 1621   GLU 60* 10/17/2012 1128      Component Value Date/Time   CALCIUM 8.9 10/17/2012 0956   CALCIUM 8.9 09/12/2012 0515   CALCIUM 9.4 11/07/2011 1621   ALKPHOS 87 10/17/2012 0956   ALKPHOS 65 09/11/2012 0525   ALKPHOS 100* 11/07/2011 1621   AST 18 10/17/2012 0956   AST 14 09/11/2012 0525   AST 21 11/07/2011 1621   ALT 17 10/17/2012 0956   ALT 8 09/11/2012 0525   BILITOT 0.29 10/17/2012 0956   BILITOT 0.2* 09/11/2012 0525   BILITOT 0.50 11/07/2011 1621       RADIOGRAPHIC STUDIES: Ct  Chest W Contrast  10/17/2012  **ADDENDUM** CREATED: 10/17/2012 13:55:09  Another consideration for the diffuse bowel wall thickening involving the ascending colon would be a hematoma within the bowel wall.  Findings discussed with Dr. Jerolyn Center on 10/17/2012 at 1:45 p.m.  **END ADDENDUM** SIGNED BY: Genevive Bi, M.D.   10/17/2012  *RADIOLOGY REPORT*  Clinical Data:  Lung cancer.  Chemotherapy ongoing.  Radiation therapy complete.  Restaging.  CT CHEST, ABDOMEN AND PELVIS WITH CONTRAST  Technique:  Multidetector CT imaging of the chest, abdomen and pelvis was performed following the standard protocol during bolus administration of intravenous contrast.  Contrast: OMNIPAQUE IOHEXOL 300 MG/ML  SOLN  Comparison:  CT 08/01/2012 .  CT CHEST  Findings:  A port right anterior chest wall.  No axillary supraclavicular lymphadenopathy.  There is enlarged prevascular lymph node measuring 13 mm short axis.  Not changed in size but is now low attenuation centrally.  Lymph node in the anterior mediastinum measures 10 mm (image 27)   increased from 8 mm on prior.  Review lung parenchyma shows volume loss in the left hemithorax. There is nodular pleural thickening at the left lung base with the small loculated effusion.  These findings similar to prior.  There is peripheral calcifications within the pleural space suggesting talc  pleurodesis.  No new pulmonary nodules.  IMPRESSION:  1.  Change in density of enlarged prevascular lymph node without change in size.  This could represent a positive response to therapy. 2.  Stable to mildly increased lymph node in the prevascular space. Recommend attention on follow-up. 3.  Stable post therapy change in the left lower lobe.  CT ABDOMEN AND PELVIS  Findings:  There are no new hepatic lesions.  Cystic lesions in the left lateral hepatic lobe are stable.  Gallbladder, pancreas, spleen, adrenal glands, kidneys are unchanged.  There multiple low- density cysts extend from the right kidney and a coarse calcification.  The stomach, small bowel, appendix, and cecum normal.  The ascending colon demonstrates circumferential mural thickening over a 12 cm segment which is new from prior.  There are diverticula in this region but there is minimal pericolonic inflammation to suggest acute diverticulitis.  Legrand Rams this to represent segmental colitis.  The distal colon demonstrates diverticula through the sigmoid region.  The rectum is normal.  Abdominal aorta normal caliber.  There is an infrarenal IVC filter noted.  No retroperitoneal lymphadenopathy.  No peritoneal disease.  The bladder prostate gland normal.  No pelvic lymphadenopathy. Review of  bone windows demonstrates no aggressive osseous lesions.  The SMA, celiac trunk, and IMA are patent.  The superior mesenteric vein is normal.  IMPRESSION:  1.  No evidence of metastasis within the abdomen or pelvis. 2.  New  bowel wall thickening involving the ascending colon consistent with segmental colitis.  Differential includes infectious colitis, drug induced colitis, and  ischemic colitis. Neoplasm felt unlikely likely.  If the patient is immunocompromised consider typhlitis.  Original Report Authenticated By: Genevive Bi, M.D.    Ct Abdomen Pelvis W Contrast  10/17/2012  **ADDENDUM** CREATED: 10/17/2012 13:55:09  Another consideration for the diffuse  bowel wall thickening involving the ascending colon would be a hematoma within the bowel wall.  Findings discussed with Dr. Jerolyn Center on 10/17/2012 at 1:45 p.m.  **END ADDENDUM** SIGNED BY: Genevive Bi, M.D.   10/17/2012  *RADIOLOGY REPORT*  Clinical Data:  Lung cancer.  Chemotherapy ongoing.  Radiation therapy complete.  Restaging.  CT CHEST, ABDOMEN AND PELVIS WITH  CONTRAST  Technique:  Multidetector CT imaging of the chest, abdomen and pelvis was performed following the standard protocol during bolus administration of intravenous contrast.  Contrast: OMNIPAQUE IOHEXOL 300 MG/ML  SOLN  Comparison:  CT 08/01/2012 .  CT CHEST  Findings:  A port right anterior chest wall.  No axillary supraclavicular lymphadenopathy.  There is enlarged prevascular lymph node measuring 13 mm short axis.  Not changed in size but is now low attenuation centrally.  Lymph node in the anterior mediastinum measures 10 mm (image 27)   increased from 8 mm on prior.  Review lung parenchyma shows volume loss in the left hemithorax. There is nodular pleural thickening at the left lung base with the small loculated effusion.  These findings similar to prior.  There is peripheral calcifications within the pleural space suggesting talc pleurodesis.  No new pulmonary nodules.  IMPRESSION:  1.  Change in density of enlarged prevascular lymph node without change in size.  This could represent a positive response to therapy. 2.  Stable to mildly increased lymph node in the prevascular space. Recommend attention on follow-up. 3.  Stable post therapy change in the left lower lobe.  CT ABDOMEN AND PELVIS  Findings:  There are no new hepatic lesions.  Cystic lesions in the left lateral hepatic lobe are stable.  Gallbladder, pancreas, spleen, adrenal glands, kidneys are unchanged.  There multiple low- density cysts extend from the right kidney and a coarse calcification.  The stomach, small bowel, appendix, and cecum normal.  The ascending colon  demonstrates circumferential mural thickening over a 12 cm segment which is new from prior.  There are diverticula in this region but there is minimal pericolonic inflammation to suggest acute diverticulitis.  Legrand Rams this to represent segmental colitis.  The distal colon demonstrates diverticula through the sigmoid region.  The rectum is normal.  Abdominal aorta normal caliber.  There is an infrarenal IVC filter noted.  No retroperitoneal lymphadenopathy.  No peritoneal disease.  The bladder prostate gland normal.  No pelvic lymphadenopathy. Review of  bone windows demonstrates no aggressive osseous lesions.  The SMA, celiac trunk, and IMA are patent.  The superior mesenteric vein is normal.  IMPRESSION:  1.  No evidence of metastasis within the abdomen or pelvis. 2.  New  bowel wall thickening involving the ascending colon consistent with segmental colitis.  Differential includes infectious colitis, drug induced colitis, and  ischemic colitis. Neoplasm felt unlikely likely.  If the patient is immunocompromised consider typhlitis.  Original Report Authenticated By: Genevive Bi, M.D.     ASSESSMENT: This is a very pleasant 78 years old Hispanic male with metastatic non-small cell lung cancer was recently treated with 6 cycles of chemotherapy with single agent docetaxel. He tolerated his treatment fairly well except for fatigue and he has stable disease after cycle #6.  PLAN: I discussed the scan results with the patient today. I given him the option of continuing systemic chemotherapy with docetaxel versus observation and restaging scan in 2 months. The patient would like to take a break from the chemotherapy to enjoy his holiday with his family. I would see him back for followup visit in 2 months with repeat CT scan of the chest, abdomen and pelvis for restaging of his disease.  He was advised to call immediately if he has any concerning symptoms in the interval.  All questions were answered. The patient  knows to call the clinic with any problems, questions or concerns. We can certainly see the  patient much sooner if necessary.  I spent 15 minutes counseling the patient face to face. The total time spent in the appointment was 25 minutes.

## 2012-10-24 NOTE — Telephone Encounter (Signed)
gv pt appt schedule for January 2014 including ct for 1.6.14. Per 11/6 pof cx all lb/chemo appts until next visit (2mos).

## 2012-10-24 NOTE — Patient Instructions (Signed)
CT scan of the chest, abdomen and pelvis showed stable disease. There was a questionable inflammatory process in the colon but you have no symptoms correlating with that. We'll continue observation for now with repeat CT scan of the chest, abdomen and pelvis in 2 months.

## 2012-10-25 ENCOUNTER — Ambulatory Visit: Payer: Medicare Other

## 2012-10-26 ENCOUNTER — Other Ambulatory Visit: Payer: Self-pay | Admitting: Physician Assistant

## 2012-10-31 ENCOUNTER — Other Ambulatory Visit: Payer: Medicare Other | Admitting: Lab

## 2012-11-30 ENCOUNTER — Other Ambulatory Visit: Payer: Medicare Other

## 2012-12-05 ENCOUNTER — Other Ambulatory Visit: Payer: Self-pay | Admitting: Physician Assistant

## 2012-12-06 ENCOUNTER — Telehealth: Payer: Self-pay | Admitting: Internal Medicine

## 2012-12-06 NOTE — Telephone Encounter (Signed)
LMONVM ADVIISNG THE PT OF HIS APPT ON 12/26/2012@9 :00AM WITH JACKIE HUNTER

## 2012-12-15 ENCOUNTER — Inpatient Hospital Stay (HOSPITAL_COMMUNITY)
Admission: EM | Admit: 2012-12-15 | Discharge: 2013-01-02 | DRG: 207 | Disposition: A | Discharge status: Rehabilitation Facility | Payer: Medicare Other | Attending: Internal Medicine | Admitting: Internal Medicine

## 2012-12-15 ENCOUNTER — Inpatient Hospital Stay (HOSPITAL_COMMUNITY): Payer: Medicare Other

## 2012-12-15 ENCOUNTER — Emergency Department (HOSPITAL_COMMUNITY): Payer: Medicare Other

## 2012-12-15 DIAGNOSIS — J189 Pneumonia, unspecified organism: Secondary | ICD-10-CM

## 2012-12-15 DIAGNOSIS — I498 Other specified cardiac arrhythmias: Secondary | ICD-10-CM | POA: Diagnosis present

## 2012-12-15 DIAGNOSIS — I10 Essential (primary) hypertension: Secondary | ICD-10-CM | POA: Diagnosis present

## 2012-12-15 DIAGNOSIS — R4182 Altered mental status, unspecified: Secondary | ICD-10-CM

## 2012-12-15 DIAGNOSIS — N400 Enlarged prostate without lower urinary tract symptoms: Secondary | ICD-10-CM | POA: Diagnosis present

## 2012-12-15 DIAGNOSIS — C3490 Malignant neoplasm of unspecified part of unspecified bronchus or lung: Secondary | ICD-10-CM

## 2012-12-15 DIAGNOSIS — J910 Malignant pleural effusion: Secondary | ICD-10-CM | POA: Diagnosis present

## 2012-12-15 DIAGNOSIS — I82409 Acute embolism and thrombosis of unspecified deep veins of unspecified lower extremity: Secondary | ICD-10-CM

## 2012-12-15 DIAGNOSIS — Z86718 Personal history of other venous thrombosis and embolism: Secondary | ICD-10-CM

## 2012-12-15 DIAGNOSIS — I959 Hypotension, unspecified: Secondary | ICD-10-CM

## 2012-12-15 DIAGNOSIS — Z87891 Personal history of nicotine dependence: Secondary | ICD-10-CM

## 2012-12-15 DIAGNOSIS — C3410 Malignant neoplasm of upper lobe, unspecified bronchus or lung: Secondary | ICD-10-CM | POA: Diagnosis present

## 2012-12-15 DIAGNOSIS — E785 Hyperlipidemia, unspecified: Secondary | ICD-10-CM | POA: Diagnosis present

## 2012-12-15 DIAGNOSIS — R600 Localized edema: Secondary | ICD-10-CM

## 2012-12-15 DIAGNOSIS — I2510 Atherosclerotic heart disease of native coronary artery without angina pectoris: Secondary | ICD-10-CM | POA: Diagnosis present

## 2012-12-15 DIAGNOSIS — R0902 Hypoxemia: Secondary | ICD-10-CM

## 2012-12-15 DIAGNOSIS — J90 Pleural effusion, not elsewhere classified: Secondary | ICD-10-CM

## 2012-12-15 DIAGNOSIS — J449 Chronic obstructive pulmonary disease, unspecified: Secondary | ICD-10-CM | POA: Diagnosis present

## 2012-12-15 DIAGNOSIS — D638 Anemia in other chronic diseases classified elsewhere: Secondary | ICD-10-CM | POA: Diagnosis present

## 2012-12-15 DIAGNOSIS — K219 Gastro-esophageal reflux disease without esophagitis: Secondary | ICD-10-CM | POA: Diagnosis present

## 2012-12-15 DIAGNOSIS — C801 Malignant (primary) neoplasm, unspecified: Secondary | ICD-10-CM

## 2012-12-15 DIAGNOSIS — R4701 Aphasia: Secondary | ICD-10-CM

## 2012-12-15 DIAGNOSIS — J9620 Acute and chronic respiratory failure, unspecified whether with hypoxia or hypercapnia: Secondary | ICD-10-CM

## 2012-12-15 DIAGNOSIS — R1310 Dysphagia, unspecified: Secondary | ICD-10-CM

## 2012-12-15 DIAGNOSIS — E872 Acidosis, unspecified: Secondary | ICD-10-CM | POA: Diagnosis present

## 2012-12-15 DIAGNOSIS — I6789 Other cerebrovascular disease: Secondary | ICD-10-CM | POA: Diagnosis present

## 2012-12-15 DIAGNOSIS — K5900 Constipation, unspecified: Secondary | ICD-10-CM | POA: Diagnosis present

## 2012-12-15 DIAGNOSIS — IMO0002 Reserved for concepts with insufficient information to code with codable children: Secondary | ICD-10-CM | POA: Diagnosis present

## 2012-12-15 DIAGNOSIS — Z66 Do not resuscitate: Secondary | ICD-10-CM | POA: Diagnosis present

## 2012-12-15 DIAGNOSIS — J9690 Respiratory failure, unspecified, unspecified whether with hypoxia or hypercapnia: Secondary | ICD-10-CM

## 2012-12-15 DIAGNOSIS — M899 Disorder of bone, unspecified: Secondary | ICD-10-CM | POA: Diagnosis present

## 2012-12-15 DIAGNOSIS — J9600 Acute respiratory failure, unspecified whether with hypoxia or hypercapnia: Secondary | ICD-10-CM

## 2012-12-15 DIAGNOSIS — G9341 Metabolic encephalopathy: Secondary | ICD-10-CM | POA: Diagnosis present

## 2012-12-15 DIAGNOSIS — R29810 Facial weakness: Secondary | ICD-10-CM

## 2012-12-15 DIAGNOSIS — I639 Cerebral infarction, unspecified: Secondary | ICD-10-CM

## 2012-12-15 DIAGNOSIS — E119 Type 2 diabetes mellitus without complications: Secondary | ICD-10-CM

## 2012-12-15 LAB — COMPREHENSIVE METABOLIC PANEL
ALT: 6 U/L (ref 0–53)
AST: 12 U/L (ref 0–37)
Albumin: 3.1 g/dL — ABNORMAL LOW (ref 3.5–5.2)
Alkaline Phosphatase: 93 U/L (ref 39–117)
BUN: 15 mg/dL (ref 6–23)
CO2: 26 mEq/L (ref 19–32)
Calcium: 9.1 mg/dL (ref 8.4–10.5)
Chloride: 102 mEq/L (ref 96–112)
Creatinine, Ser: 1.04 mg/dL (ref 0.50–1.35)
GFR calc Af Amer: 87 mL/min — ABNORMAL LOW (ref >90)
GFR calc non Af Amer: 65 mL/min — ABNORMAL LOW (ref >90)
Glucose, Bld: 146 mg/dL — ABNORMAL HIGH (ref 70–99)
Glucose, Bld: 171 mg/dL — ABNORMAL HIGH (ref 70–99)
Potassium: 4.7 mEq/L (ref 3.5–5.1)
Sodium: 139 mEq/L (ref 135–145)
Total Bilirubin: 0.3 mg/dL (ref 0.3–1.2)
Total Protein: 6.5 g/dL (ref 6.0–8.3)

## 2012-12-15 LAB — BLOOD GAS, ARTERIAL
Bicarbonate: 24.3 mEq/L — ABNORMAL HIGH (ref 20.0–24.0)
Drawn by: 13898
PEEP: 5 cmH2O
Patient temperature: 96.6
RATE: 20 resp/min
pH, Arterial: 7.381 (ref 7.350–7.450)

## 2012-12-15 LAB — URINALYSIS, ROUTINE W REFLEX MICROSCOPIC
Ketones, ur: NEGATIVE mg/dL
Nitrite: NEGATIVE
Protein, ur: NEGATIVE mg/dL
pH: 5 (ref 5.0–8.0)

## 2012-12-15 LAB — CBC WITH DIFFERENTIAL/PLATELET
Basophils Absolute: 0 10*3/uL (ref 0.0–0.1)
Basophils Relative: 0 % (ref 0–1)
Eosinophils Absolute: 0.2 10*3/uL (ref 0.0–0.7)
Eosinophils Relative: 3 % (ref 0–5)
MCH: 26 pg (ref 26.0–34.0)
MCHC: 29 g/dL — ABNORMAL LOW (ref 30.0–36.0)
MCV: 89.4 fL (ref 78.0–100.0)
Neutrophils Relative %: 53 % (ref 43–77)
Platelets: 271 10*3/uL (ref 150–400)
RDW: 14.1 % (ref 11.5–15.5)

## 2012-12-15 LAB — CORTISOL: Cortisol, Plasma: 10.7 ug/dL

## 2012-12-15 LAB — GLUCOSE, CAPILLARY
Glucose-Capillary: 63 mg/dL — ABNORMAL LOW (ref 70–99)
Glucose-Capillary: 109 mg/dL — ABNORMAL HIGH (ref 70–99)
Glucose-Capillary: 42 mg/dL — CL (ref 70–99)
Glucose-Capillary: 82 mg/dL (ref 70–99)
Glucose-Capillary: 122 mg/dL — ABNORMAL HIGH (ref 70–99)

## 2012-12-15 LAB — BASIC METABOLIC PANEL
BUN: 18 mg/dL (ref 6–23)
Chloride: 103 mEq/L (ref 96–112)
Creatinine, Ser: 1.08 mg/dL (ref 0.50–1.35)
GFR calc Af Amer: 72 mL/min — ABNORMAL LOW (ref >90)
GFR calc non Af Amer: 62 mL/min — ABNORMAL LOW (ref >90)

## 2012-12-15 LAB — POCT I-STAT TROPONIN I: Troponin i, poc: 0 ng/mL (ref 0.00–0.08)

## 2012-12-15 LAB — POCT I-STAT 3, ART BLOOD GAS (G3+)
Acid-base deficit: 20 mmol/L — ABNORMAL HIGH (ref 0.0–2.0)
Bicarbonate: 6.6 mEq/L — ABNORMAL LOW (ref 20.0–24.0)
Patient temperature: 98.6
pH, Arterial: 7.113 — CL (ref 7.350–7.450)

## 2012-12-15 LAB — PROCALCITONIN: Procalcitonin: <0.1 ng/mL

## 2012-12-15 LAB — APTT: aPTT: 34 seconds (ref 24–37)

## 2012-12-15 LAB — LIPASE, BLOOD: Lipase: 33 U/L (ref 11–59)

## 2012-12-15 LAB — LACTIC ACID, PLASMA: Lactic Acid, Venous: 2.5 mmol/L — ABNORMAL HIGH (ref 0.5–2.2)

## 2012-12-15 MED ORDER — SODIUM CHLORIDE 0.9 % IV SOLN
1.0000 mg/h | INTRAVENOUS | Status: DC
  Administered 2012-12-15: 1 mg/h via INTRAVENOUS
  Administered 2012-12-16: 2 mg/h via INTRAVENOUS
  Filled 2012-12-15 (×2): qty 10

## 2012-12-15 MED ORDER — PRO-STAT SUGAR FREE PO LIQD
30.0000 mL | Freq: Every day | ORAL | Status: DC
  Administered 2012-12-15 – 2012-12-18 (×17): 30 mL
  Filled 2012-12-15 (×25): qty 30

## 2012-12-15 MED ORDER — BIOTENE DRY MOUTH MT LIQD
15.0000 mL | Freq: Four times a day (QID) | OROMUCOSAL | Status: DC
  Administered 2012-12-15 – 2012-12-21 (×25): 15 mL via OROMUCOSAL

## 2012-12-15 MED ORDER — MIDAZOLAM BOLUS VIA INFUSION
1.0000 mg | INTRAVENOUS | Status: DC | PRN
  Filled 2012-12-15: qty 2

## 2012-12-15 MED ORDER — DEXTROSE 50 % IV SOLN
INTRAVENOUS | Status: AC
  Administered 2012-12-15: 25 mL
  Filled 2012-12-15: qty 50

## 2012-12-15 MED ORDER — FENTANYL BOLUS VIA INFUSION
25.0000 ug | Freq: Four times a day (QID) | INTRAVENOUS | Status: DC | PRN
  Filled 2012-12-15: qty 100

## 2012-12-15 MED ORDER — DEXTROSE 5 % IV SOLN
1.0000 g | Freq: Once | INTRAVENOUS | Status: AC
  Administered 2012-12-15: 1 g via INTRAVENOUS
  Filled 2012-12-15: qty 1

## 2012-12-15 MED ORDER — SODIUM CHLORIDE 0.9 % IV SOLN
INTRAVENOUS | Status: DC
  Administered 2012-12-15: 13:00:00 via INTRAVENOUS
  Administered 2012-12-15: 125 mL via INTRAVENOUS

## 2012-12-15 MED ORDER — DEXTROSE 5 % IV SOLN
1.0000 g | Freq: Two times a day (BID) | INTRAVENOUS | Status: DC
  Administered 2012-12-15 – 2012-12-18 (×6): 1 g via INTRAVENOUS
  Filled 2012-12-15 (×7): qty 1

## 2012-12-15 MED ORDER — ALBUTEROL SULFATE HFA 108 (90 BASE) MCG/ACT IN AERS
2.0000 | INHALATION_SPRAY | RESPIRATORY_TRACT | Status: DC | PRN
  Filled 2012-12-15: qty 6.7

## 2012-12-15 MED ORDER — PROPOFOL 10 MG/ML IV BOLUS
0.5000 mg/kg | Freq: Once | INTRAVENOUS | Status: AC
  Administered 2012-12-15: 15 ug/kg/min via INTRAVENOUS

## 2012-12-15 MED ORDER — PROPOFOL 10 MG/ML IV BOLUS
INTRAVENOUS | Status: AC
  Administered 2012-12-15: 100 mg
  Filled 2012-12-15: qty 20

## 2012-12-15 MED ORDER — INSULIN ASPART 100 UNIT/ML ~~LOC~~ SOLN
2.0000 [IU] | SUBCUTANEOUS | Status: DC
  Administered 2012-12-15 – 2012-12-16 (×2): 2 [IU] via SUBCUTANEOUS
  Administered 2012-12-16: 4 [IU] via SUBCUTANEOUS
  Administered 2012-12-16 – 2012-12-17 (×2): 2 [IU] via SUBCUTANEOUS
  Administered 2012-12-17: 4 [IU] via SUBCUTANEOUS
  Administered 2012-12-17 (×2): 2 [IU] via SUBCUTANEOUS
  Administered 2012-12-17 – 2012-12-18 (×3): 4 [IU] via SUBCUTANEOUS
  Administered 2012-12-18: 2 [IU] via SUBCUTANEOUS
  Administered 2012-12-18: 4 [IU] via SUBCUTANEOUS
  Administered 2012-12-18: 2 [IU] via SUBCUTANEOUS
  Administered 2012-12-18 – 2012-12-19 (×3): 4 [IU] via SUBCUTANEOUS
  Administered 2012-12-19: 6 [IU] via SUBCUTANEOUS
  Administered 2012-12-19: 2 [IU] via SUBCUTANEOUS
  Administered 2012-12-19: 4 [IU] via SUBCUTANEOUS
  Administered 2012-12-19: 6 [IU] via SUBCUTANEOUS
  Administered 2012-12-19: 4 [IU] via SUBCUTANEOUS
  Administered 2012-12-20 (×2): 2 [IU] via SUBCUTANEOUS
  Administered 2012-12-20: 4 [IU] via SUBCUTANEOUS
  Administered 2012-12-20 (×3): 2 [IU] via SUBCUTANEOUS
  Administered 2012-12-21: 4 [IU] via SUBCUTANEOUS
  Administered 2012-12-21 – 2012-12-22 (×3): 2 [IU] via SUBCUTANEOUS
  Administered 2012-12-22 (×3): 4 [IU] via SUBCUTANEOUS
  Administered 2012-12-22: 2 [IU] via SUBCUTANEOUS
  Administered 2012-12-23 (×2): 4 [IU] via SUBCUTANEOUS
  Administered 2012-12-23: 2 [IU] via SUBCUTANEOUS
  Administered 2012-12-23: 4 [IU] via SUBCUTANEOUS
  Administered 2012-12-23: 2 [IU] via SUBCUTANEOUS
  Administered 2012-12-23: 4 [IU] via SUBCUTANEOUS
  Administered 2012-12-24: 6 [IU] via SUBCUTANEOUS
  Administered 2012-12-24: 2 [IU] via SUBCUTANEOUS
  Administered 2012-12-24 (×3): 4 [IU] via SUBCUTANEOUS
  Administered 2012-12-24: 2 [IU] via SUBCUTANEOUS
  Administered 2012-12-25: 4 [IU] via SUBCUTANEOUS
  Administered 2012-12-25 (×2): 6 [IU] via SUBCUTANEOUS

## 2012-12-15 MED ORDER — SUCCINYLCHOLINE CHLORIDE 20 MG/ML IJ SOLN
20.0000 mg | Freq: Once | INTRAMUSCULAR | Status: AC
  Administered 2012-12-15: 20 mg via INTRAVENOUS

## 2012-12-15 MED ORDER — VANCOMYCIN HCL 10 G IV SOLR
1500.0000 mg | Freq: Once | INTRAVENOUS | Status: AC
  Administered 2012-12-15: 1500 mg via INTRAVENOUS
  Filled 2012-12-15: qty 1500

## 2012-12-15 MED ORDER — SODIUM CHLORIDE 0.9 % IV SOLN
25.0000 ug/h | INTRAVENOUS | Status: DC
  Filled 2012-12-15 (×2): qty 50

## 2012-12-15 MED ORDER — HEPARIN SODIUM (PORCINE) 5000 UNIT/ML IJ SOLN
5000.0000 [IU] | Freq: Three times a day (TID) | INTRAMUSCULAR | Status: DC
  Administered 2012-12-15 – 2012-12-22 (×22): 5000 [IU] via SUBCUTANEOUS
  Filled 2012-12-15 (×26): qty 1

## 2012-12-15 MED ORDER — ETOMIDATE 2 MG/ML IV SOLN
100.0000 mg | Freq: Once | INTRAVENOUS | Status: AC
  Administered 2012-12-15: 100 mg via INTRAVENOUS

## 2012-12-15 MED ORDER — VANCOMYCIN HCL IN DEXTROSE 1-5 GM/200ML-% IV SOLN
1000.0000 mg | INTRAVENOUS | Status: DC
  Administered 2012-12-16 – 2012-12-18 (×3): 1000 mg via INTRAVENOUS
  Filled 2012-12-15 (×3): qty 200

## 2012-12-15 MED ORDER — SODIUM CHLORIDE 0.9 % IV SOLN
250.0000 mL | INTRAVENOUS | Status: DC | PRN
  Administered 2012-12-17: 250 mL via INTRAVENOUS

## 2012-12-15 MED ORDER — PANTOPRAZOLE SODIUM 40 MG IV SOLR
40.0000 mg | Freq: Every day | INTRAVENOUS | Status: DC
  Administered 2012-12-15 – 2012-12-17 (×3): 40 mg via INTRAVENOUS
  Filled 2012-12-15 (×4): qty 40

## 2012-12-15 MED ORDER — PROPOFOL 10 MG/ML IV EMUL
5.0000 ug/kg/min | INTRAVENOUS | Status: DC
  Administered 2012-12-15 (×2): 40 ug/kg/min via INTRAVENOUS
  Filled 2012-12-15: qty 100

## 2012-12-15 MED ORDER — PROPOFOL 10 MG/ML IV EMUL
5.0000 ug/kg/min | Freq: Once | INTRAVENOUS | Status: AC
  Administered 2012-12-15: 5 ug/kg/min via INTRAVENOUS
  Filled 2012-12-15: qty 100

## 2012-12-15 MED ORDER — SODIUM CHLORIDE 0.9 % IV BOLUS (SEPSIS)
500.0000 mL | Freq: Once | INTRAVENOUS | Status: AC
  Administered 2012-12-15: 500 mL via INTRAVENOUS

## 2012-12-15 MED ORDER — SODIUM CHLORIDE 0.9 % IV SOLN
25.0000 ug/h | INTRAVENOUS | Status: DC
  Administered 2012-12-15: 50 ug/h via INTRAVENOUS
  Filled 2012-12-15: qty 50

## 2012-12-15 MED ORDER — DEXTROSE 50 % IV SOLN
25.0000 mL | Freq: Once | INTRAVENOUS | Status: AC | PRN
  Administered 2012-12-15: 25 mL via INTRAVENOUS
  Filled 2012-12-15: qty 50

## 2012-12-15 MED ORDER — PROMOTE PO LIQD
1000.0000 mL | ORAL | Status: DC
  Administered 2012-12-15 – 2012-12-17 (×3): 1000 mL
  Filled 2012-12-15 (×6): qty 1000

## 2012-12-15 MED ORDER — ADULT MULTIVITAMIN LIQUID CH
5.0000 mL | Freq: Every day | ORAL | Status: DC
  Administered 2012-12-15 – 2012-12-26 (×11): 5 mL
  Filled 2012-12-15 (×13): qty 5

## 2012-12-15 MED ORDER — CHLORHEXIDINE GLUCONATE 0.12 % MT SOLN
15.0000 mL | Freq: Two times a day (BID) | OROMUCOSAL | Status: DC
  Administered 2012-12-15 – 2012-12-19 (×9): 15 mL via OROMUCOSAL
  Filled 2012-12-15 (×9): qty 15

## 2012-12-15 NOTE — ED Notes (Signed)
TRANSPORTED TO CT SCAN.  

## 2012-12-15 NOTE — Progress Notes (Signed)
ANTIBIOTIC CONSULT NOTE - INITIAL  Pharmacy Consult for Vancomycin and Cefepime Indication: Pneumonia  Allergies  Allergen Reactions  . Penicillins Other (See Comments)    Unknown childhood allergy    Patient Measurements: Height: 5' 4.96" (165 cm) Weight: 185 lb 3 oz (84 kg) IBW/kg (Calculated) : 61.41   Vital Signs: Temp: 97.2 F (36.2 C) (12/28 0330) BP: 117/74 mmHg (12/28 0330) Pulse Rate: 93  (12/28 0330)  Labs:  Basename 12/15/12 0246  WBC 6.4  HGB 10.8*  PLT 271  LABCREA --  CREATININE --   Estimated Creatinine Clearance: 72.1 ml/min (by C-G formula based on Cr of 0.8). No results found for this basename: VANCOTROUGH:2,VANCOPEAK:2,VANCORANDOM:2,GENTTROUGH:2,GENTPEAK:2,GENTRANDOM:2,TOBRATROUGH:2,TOBRAPEAK:2,TOBRARND:2,AMIKACINPEAK:2,AMIKACINTROU:2,AMIKACIN:2, in the last 72 hours   Microbiology: No results found for this or any previous visit (from the past 720 hour(s)).  Medical History: Past Medical History  Diagnosis Date  . Hypertension   . Diabetes mellitus   . Dyslipidemia   . Mini stroke 2005  . Degenerative disc disease   . GERD (gastroesophageal reflux disease)   . Osteopenia 2005  . Coronary artery disease 2005    treated medically  . BPH (benign prostatic hyperplasia)   . DVT (deep venous thrombosis)   . History of chemotherapy   . Diverticulosis     per scan 05/28/12  . Calculus of kidney     per scan 05/28/12  . Hx of radiation therapy 06/14/12 -07/05/12    left chest  . lung ca 11/12/2010    LUL    Medications:  Tylenol  Albuterol  Lotrel  ASA  Glucovance  MSContin Protonix    S/p 6 cycles docetaxol (last completed 09/2012)  Assessment: 78 yo male with VDRF, probable pneumonia for empiric antibiotics  Goal of Therapy:  Vancomycin trough 15-20  Plan:  Vancomycin 1500 mg IV now, then 1 g IV q24h Cefepime 1 g IV q12h F/U renal function  Johnathan Bowman, Johnathan Bowman 12/15/2012,4:13 AM

## 2012-12-15 NOTE — Progress Notes (Signed)
Hypoglycemic Event  CBG: 66  Treatment: D50 IV 25 mL  Symptoms: None  Follow-up CBG: Time: 1227 CBG Result: 109  Possible Reasons for Event: Unknown  Comments/MD notified:    Sherlyn Lees  Remember to initiate Hypoglycemia Order Set & complete

## 2012-12-15 NOTE — Progress Notes (Signed)
Name: Johnathan Bowman MRN: [id number] DOB: Jan 31, 1929    LOS: 0  PULMONARY / CRITICAL CARE MEDICINE  HPI:   78 years old male with PMH relevant for HTN, DM, dyslipidemia, GERD, CAD, DVT. Diagnosed with metastatic (stage IV) non small cell lung cancer in November 2011 (LUL). Treated since then with chemotherapy. Last cycle given in October 2013. He has history of a left sided malignant pleural effusion (cytology from 01/05/12) and had a Pleurex catheter placed in the past. History obtained from records. Patient unable to provide history due to intubation. Wife was present at bedside but very poor historian. Apparently he has been experiencing progressive SOB, productive cough and chills for one week. On the day of admission he was found by his wife with AMS, some questionable chest pain and worsening respiratory distress. At arrival to the ED he was hypoxic and was started on BIPAP. His mental status deteriorated and had to be intubated. At the time of my exam the patient is intubated on mechanical ventilation, sedated with propofol, hemodynamically stable.  Vital Signs: Temp:  [96.4 F (35.8 C)-97.5 F (36.4 C)] 97.5 F (36.4 C) (12/28 0900) Pulse Rate:  [27-120] 78  (12/28 0908) Resp:  [16-23] 20  (12/28 0908) BP: (60-157)/(48-126) 98/62 mmHg (12/28 0908) SpO2:  [91 %-100 %] 98 % (12/28 0908) FiO2 (%):  [40 %-50 %] 40 % (12/28 0908) Weight:  [84 kg (185 lb 3 oz)-87.7 kg (193 lb 5.5 oz)] 87.7 kg (193 lb 5.5 oz) (12/28 0500)  Physical Examination: General:  Intubated, mechanically ventilated, no acute distress Neuro:  Sedated, synchronous, nonfocal. HEENT:  PERRL, pink conjunctivae, moist membranes Neck:  Supple, no JVD   Cardiovascular:  RRR, no M/R/G Lungs:  Bilateral diffuse crackles, no wheezing Abdomen:  Soft, nontender, nondistended, bowel sounds present Musculoskeletal:  Moves all extremities, 1+ pedal edema Skin:  No rash  ASSESSMENT AND PLAN  PULMONARY  Lab 12/15/12 0545  12/15/12 0318  PHART 7.381 7.113*  PCO2ART 41.3 20.6*  PO2ART 94.2 41.0*  HCO3 24.3* 6.6*  O2SAT 97.6 60.0   Ventilator Settings: Vent Mode:  [-] PRVC FiO2 (%):  [40 %-50 %] 40 % Set Rate:  [20 bmp] 20 bmp Vt Set:  [520 mL] 520 mL PEEP:  [5 cmH20] 5 cmH20 Plateau Pressure:  [21 cmH20-23 cmH20] 23 cmH20 CXR:  Endotracheal tube in right main stem bronchus. Possible increased left pleural effusion with LLL opacification. ETT:  7.5. Was retracted in the ED.  A:   1) Health care associated pneumonia 2) Acute on chronic respiratory failure 3) Loculated malignant left pleural effusion P:   - Full vent support. - PRVC. Vt: 8cc/kg, RR: 18, PEEP: 5, FiO2 50%. - VAP prevention order set. - Daily awakening and SBT. - CT noted but no active interventions for now. - May need Pleurex catheter placement but will need more discussion with family regarding plan of care. - Continue Cefepime / vancomycin. - Albuterol / ipratropium PRN.  CARDIOVASCULAR  Lab 12/15/12 0417 12/15/12 0247 12/15/12 0246  TROPONINI <0.30 -- --  LATICACIDVEN -- -- 2.5*  PROBNP -- 95.3 --   ECG:  Sinus tachycardia Lines: Left port-A-cath. Peripheral IV's  A:  1) Metabolic acidosis per ABG  (only mildly elevated lactate), Chemistry with bicarb of 24. 2) Hemodynamically stable after transient hypotension due to sedation. P:  - Decrease NS to 50 ml/hr and once TF are started then will likely KVO, hopefully BP will stabilize off propofol. - Troponin negative.  RENAL WBC  6.4 RBC 4.16 Hemoglobin 10.8 HCT 37.2 Platelets 271 Sodium 139 Potassium 4.9 Chloride 103 CO2 24 BUN 14 Creatinine 0.97 Calcium 9.1 INR 1.05  Foley:  12/15/12  A:   1) Normal kidney function, no metabolic acidosis as ABG reported. P:   - Will follow BMP. - Replace electrolytes as needed.  GASTROINTESTINAL  Lab 12/15/12 0417 12/15/12 0246  AST 11 12  ALT 5 6  ALKPHOS 93 96  BILITOT 0.3 0.2*  PROT 6.2 6.5  ALBUMIN 3.0* 3.1*    A:     1) No issues P:   - GI prophylaxis with protonix. - Consult nutrition for TF per nutrition.  HEMATOLOGIC  Lab 12/15/12 0246  HGB 10.8*  HCT 37.2*  PLT 271  INR 1.05  APTT 34   A:   1) Anemia likely due to chronic disease P:  - we will follow CBC  INFECTIOUS  Lab 12/15/12 0417 12/15/12 0246  WBC -- 6.4  PROCALCITON <0.10 --   Cultures: Blood 12/28>>> Urine 12/28>>> Sputum 12/28>>> Antibiotics: Cefepime 12/28>>> Vancomycin 12/28>>>  A:   1) Health care associated pneumonia P:   - Antibiotics as above - May require drainage of fluid but will monitor for now. - Narrow abx as cx result.  ENDOCRINE  Lab 12/15/12 0807  GLUCAP 121*   A:   1) DM P:   - Novolog sliding scale. (ICU protocol)  NEUROLOGIC  A:   1) Altered mental status likely secondary to hypoxia and metabolic acidosis. P:   - CT scan of the head with no acute stroke. - Change sedation to versed/fentanyl.  BEST PRACTICE / DISPOSITION - Level of Care:  ICU - Primary Service:  PCCM - Consultants:  None - Code Status:  Full code (personally discussed with the patient's wife) - Diet:  NPO - DVT Px:  heparin - GI Px: protonix - Skin Integrity:  Intact - Social / Family:  Wife updated at bedside.  The patient is critically ill with multiple organ systems failure and requires high complexity decision making for assessment and support, frequent evaluation and titration of therapies, application of advanced monitoring technologies and extensive interpretation of multiple databases.   Critical Care Time devoted to patient care services described in this note is: 35 min.  Alyson Reedy, M.D. [facility] Pulmonary/Critical Care Medicine. Pager: [phone]. After hours pager: [phone].

## 2012-12-15 NOTE — Progress Notes (Signed)
Chaplain visited ED POD A2 after receiving a pager message from the secretary. Patient was awake but appeared to be in distress. Family was in the room and also appeared to be worried about patient's health. Chaplain shared words of encouragement and hope with family member. Chaplain provided ministry of presence until patient was sent for CT exams. Patient was later admitted to 3103. Chaplain will follow-up as needed. Family thanked Chaplain for the support.

## 2012-12-15 NOTE — H&P (Signed)
Name: Johnathan Bowman MRN: [id number] DOB: [birth year]    LOS: 0  PULMONARY / CRITICAL CARE MEDICINE  HPI:   78 years old male with PMH relevant for HTN, DM, dyslipidemia, GERD, CAD, DVT. Diagnosed with metastatic (stage IV) non small cell lung cancer in November 2011 (LUL). Treated since then with chemotherapy. Last cycle given in October 2013. He has history of a left sided malignant pleural effusion (cytology from 01/05/12) and had a Pleurex catheter placed in the past. History obtained from records. Patient unable to provide history due to intubation. Wife at bedside but very poor historian. Apparently he has been experiencing progressive SOB, productive cough and chills for one week. Today he was found by his wife with AMS, some questionable chest pain and worsening respiratory distress. At arrival to the ED he was hypoxic and was started on BIPAP. His mental status deteriorated and had to be intubated. At the time of my exam the patient is intubated on mechanical ventilation, sedated with propofol, hemodynamically stable.  Past Medical History  Diagnosis Date  . Hypertension   . Diabetes mellitus   . Dyslipidemia   . Mini stroke 2005  . Degenerative disc disease   . GERD (gastroesophageal reflux disease)   . Osteopenia 2005  . Coronary artery disease 2005    treated medically  . BPH (benign prostatic hyperplasia)   . DVT (deep venous thrombosis)   . History of chemotherapy   . Diverticulosis     per scan 05/28/12  . Calculus of kidney     per scan 05/28/12  . Hx of radiation therapy 06/14/12 -07/05/12    left chest  . lung ca 11/12/2010    LUL   Past Surgical History  Procedure Date  . No past surgeries   . Pleurex cath 02/08/11    left-sided pleural effusion   Prior to Admission medications   Medication Sig Start Date End Date Taking? Authorizing Provider  acetaminophen (TYLENOL) 500 MG tablet Take 500 mg by mouth every 6 (six) hours as needed. For pain.    Historical  Provider, MD  albuterol (PROVENTIL) (2.5 MG/3ML) 0.083% nebulizer solution Take 3 mLs (2.5 mg total) by nebulization every 6 (six) hours as needed for wheezing. 09/15/12   Alison Murray, MD  amLODipine-benazepril (LOTREL) 5-10 MG per capsule Take 1 capsule by mouth daily.      Historical Provider, MD  aspirin 81 MG tablet Take 81 mg by mouth daily.     Historical Provider, MD  dexamethasone (DECADRON) 4 MG tablet Take 4 mg by mouth 2 (two) times daily with a meal. 2 tabs by mouth BID, the day before, the day of and the day after chemotherapy    Historical Provider, MD  glyBURIDE-metformin (GLUCOVANCE) 5-500 MG per tablet Take 2 tablets by mouth 2 (two) times daily.      Historical Provider, MD  HYDROcodone-acetaminophen (VICODIN) 5-500 MG per tablet Take 1-2 tablets by mouth every 6 (six) hours as needed for pain. For pain. 09/15/12   Alison Murray, MD  hydrOXYzine (ATARAX/VISTARIL) 25 MG tablet Take 1 tablet (25 mg total) by mouth every 4 (four) hours as needed for itching. Take 1 tablet by mouth every 8 hours as needed for itching 09/15/12   Alison Murray, MD  hydrOXYzine (ATARAX/VISTARIL) 25 MG tablet TAKE 1 TABLET BY MOUTH EVERY 8 HOURS AS NEEDED FOR ITCHING 10/26/12   Conni Slipper, PA  lidocaine-prilocaine (EMLA) cream Apply 1 application topically as needed. Apply to  Port A Cath site before chemotherapy.  02/14/11   Si Gaul, MD  morphine (MS CONTIN) 30 MG 12 hr tablet Take 1 tablet (30 mg total) by mouth 2 (two) times daily. 09/15/12   Alison Murray, MD  pantoprazole (PROTONIX) 40 MG tablet Take 1 tablet (40 mg total) by mouth 2 (two) times daily. 02/15/12   Conni Slipper, PA  PRESCRIPTION MEDICATION Chemo=last dose 2 weeks ago. Next dose in 1 week. Dr Tanja Port    Historical Provider, MD   Allergies Allergies  Allergen Reactions  . Penicillins Other (See Comments)    Unknown childhood allergy    Family History No family history on file. Social History  reports that he quit  smoking about 26 years ago. He has never used smokeless tobacco. He reports that he does not drink alcohol or use illicit drugs.  Review Of Systems:  Unable to provide.   Vital Signs: Temp:  [96.4 F (35.8 C)-97.2 F (36.2 C)] 97.2 F (36.2 C) (12/28 0330) Pulse Rate:  [27-120] 93  (12/28 0330) Resp:  [17-23] 20  (12/28 0330) BP: (60-157)/(48-126) 117/74 mmHg (12/28 0330) SpO2:  [91 %-100 %] 98 % (12/28 0330) FiO2 (%):  [50 %] 50 % (12/28 0200) Weight:  [185 lb 3 oz (84 kg)] 185 lb 3 oz (84 kg) (12/28 0200)  Physical Examination: General:  Intubated, mechanically ventilated, no acute distress Neuro:  Sedated, synchronous, nonfocal HEENT:  PERRL, pink conjunctivae, moist membranes Neck:  Supple, no JVD   Cardiovascular:  RRR, no M/R/G Lungs:  Bilateral diffuse crackles, no wheezing Abdomen:  Soft, nontender, nondistended, bowel sounds present Musculoskeletal:  Moves all extremities, 1+ pedal edema Skin:  No rash    ASSESSMENT AND PLAN  PULMONARY  Lab 12/15/12 0318  PHART 7.113*  PCO2ART 20.6*  PO2ART 41.0*  HCO3 6.6*  O2SAT 60.0   Ventilator Settings: Vent Mode:  [-] PRVC FiO2 (%):  [50 %] 50 % Set Rate:  [20 bmp] 20 bmp Vt Set:  [520 mL] 520 mL PEEP:  [5 cmH20] 5 cmH20 Plateau Pressure:  [21 cmH20] 21 cmH20 CXR:  Endotracheal tube in right main stem bronchus. Possible increased left pleural effusion with LLL opacification. ETT:  7.5. Was retracted in the ED.  A:   1) Health care associated pneumonia 2) Acute on chronic respiratory failure 3) Loculated malignant left pleural effusion P:   - Will admit to ICU - Mechanical ventilation   - PRVC. Vt: 8cc/kg, RR: 18, PEEP: 5, FiO2 50% - VAP prevention order set - Daily awakening and SBT - Will get non contrast CT scan of the chest to better assess loculated left pleural effusion - May need drainage by IR or Pleurex catheter placement - Will start Cefepime / vancomycin - Albuterol / ipratropium  PRN  CARDIOVASCULAR  Lab 12/15/12 0247 12/15/12 0246  TROPONINI -- --  LATICACIDVEN -- 2.5*  PROBNP 95.3 --   ECG:  Sinus tachycardia Lines: Left port-A-cath. Peripheral IV's  A:  1) Metabolic acidosis per ABG  (only mildly elevated lactate), Chemistry with bicarb of 24. 2) Hemodynamically stable after transient hypotension due to sedation. P:  - Will continue IVF with NS at 125 cc/hr - Will follow troponin   RENAL WBC 6.4 RBC 4.16 Hemoglobin 10.8 HCT 37.2 Platelets 271 Sodium 139 Potassium 4.9 Chloride 103 CO2 24 BUN 14 Creatinine 0.97 Calcium 9.1 INR 1.05  Foley:  12/15/12  A:   1) Normal kidney function, no metabolic acidosis as ABG reported.  P:   - will follow BMP   GASTROINTESTINAL No results found for this basename: AST:5,ALT:5,ALKPHOS:5,BILITOT:5,PROT:5,ALBUMIN:5 in the last 168 hours  A:   1) No issues P:   - GI prophylaxis with protonix  HEMATOLOGIC  Lab 12/15/12 0246  HGB 10.8*  HCT 37.2*  PLT 271  INR 1.05  APTT 34   A:   1) Anemia likely due to chronic disease P:  - we will follow CBC  INFECTIOUS  Lab 12/15/12 0246  WBC 6.4  PROCALCITON --   Cultures: Blood, urine and tracheal aspirate cultures sent. Antibiotics: - Cefepime - Vancomycin  A:   1) Health care associated pneumonia P:   - Antibiotics as above - We will follow CT scan of the chest. May need drainage of loculated pleural effusion.  ENDOCRINE No results found for this basename: GLUCAP:5 in the last 168 hours A:   1) DM P:   - Novolog sliding scale. (ICU protocol)  NEUROLOGIC  A:   1) Altered mental status likely secondary to hypoxia and metabolic acidosis. P:   - CT scan of the head with no acute stroke. - Continuous sedation with propofol and fentanyl  BEST PRACTICE / DISPOSITION - Level of Care:  ICU - Primary Service:  PCCM - Consultants:  None - Code Status:  Full code (personally discussed with the patient's wife) - Diet:  NPO - DVT Px:  heparin -  GI Px: protonix - Skin Integrity:  Intact - Social / Family:  Wife updated at bedside.  The patient is critically ill with multiple organ systems failure and requires high complexity decision making for assessment and support, frequent evaluation and titration of therapies, application of advanced monitoring technologies and extensive interpretation of multiple databases.   Critical Care Time devoted to patient care services described in this note is: 1 Hour  Overton Mam, M.D. Pulmonary and Critical Care Medicine [facility] Pager: [phone]  12/15/2012, 4:20 AM

## 2012-12-15 NOTE — Progress Notes (Signed)
INITIAL NUTRITION ASSESSMENT  DOCUMENTATION CODES Per approved criteria  -Obesity Unspecified   INTERVENTION:  Initiate Promote formula at 25 ml/hr with Prostat liquid protein 6 times daily to provide 1200 kcals (67% of estimated kcal needs), 127 gm protein (94% of estimated protein needs), 503 ml of free water  Liquid MVI daily RD to follow for nutrition care plan  NUTRITION DIAGNOSIS: Inadequate oral intake related to inability to eat as evidenced by NPO status  Goal: EN to provide 60-70% of estimated calorie needs (22-25 kcals/kg ideal body weight) and >/= 90% of estimated protein needs, based on ASPEN guidelines for permissive underfeeding in critically ill obese individuals  Monitor:  EN regimen & tolerance, respiratory status, weight, labs, I/O's  Reason for Assessment: Consult, VDRF  78 y.o. male  Admitting Dx: HCAP; acute on chronic respiratory failure  ASSESSMENT: Patient is currently intubated on ventilator support MV: 10.5 Temp: 36.6  Patient with hx of metastatic (stage IV) non small cell lung cancer; last cycle of chemo in October '13; had been experiencing progressive SOB, productive cough and chills PTA; in ED was hypoxic and was started on BIPAP; OGT in place; RD consulted for EN initiation & management.   Height: Ht Readings from Last 1 Encounters:  12/15/12 5\' 7"  (1.702 m)    Weight: Wt Readings from Last 1 Encounters:  12/15/12 193 lb 5.5 oz (87.7 kg)    Ideal Body Weight: 67.2 kg  % Ideal Body Weight: 130%  Wt Readings from Last 10 Encounters:  12/15/12 193 lb 5.5 oz (87.7 kg)  10/24/12 185 lb 1.6 oz (83.961 kg)  10/03/12 186 lb 6.4 oz (84.55 kg)  09/10/12 188 lb (85.276 kg)  08/22/12 188 lb 12.8 oz (85.639 kg)  08/15/12 186 lb 1.6 oz (84.414 kg)  08/01/12 188 lb 12.8 oz (85.639 kg)  07/11/12 189 lb 3.2 oz (85.821 kg)  07/05/12 188 lb (85.276 kg)  07/02/12 188 lb 12.8 oz (85.639 kg)    Usual Body Weight: 185 lb  % Usual Body Weight:  104%  BMI:  Body mass index is 30.28 kg/(m^2).  Estimated Nutritional Needs: Kcal: 1800 Protein: 135-145 gm Fluid: 1.8-2.0 L  Skin: Intact  Diet Order: NPO  EDUCATION NEEDS: -No education needs identified at this time   Intake/Output Summary (Last 24 hours) at 12/15/12 1313 Last data filed at 12/15/12 1225  Gross per 24 hour  Intake 1574.05 ml  Output    115 ml  Net 1459.05 ml    Labs:   Lab 12/15/12 1125 12/15/12 0418 12/15/12 0417 12/15/12 0246  NA 138 -- 138 139  K 4.1 -- 4.7 4.9  CL 103 -- 102 103  CO2 23 -- 26 24  BUN 18 -- 15 14  CREATININE 1.08 -- 1.04 0.97  CALCIUM 9.0 -- 9.1 9.1  MG -- 1.8 -- --  PHOS -- 4.5 -- --  GLUCOSE 74 -- 171* 146*    CBG (last 3)   Basename 12/15/12 1230 12/15/12 1144 12/15/12 0807  GLUCAP 109* 63* 121*    Scheduled Meds:   . antiseptic oral rinse  15 mL Mouth Rinse QID  . ceFEPime (MAXIPIME) IV  1 g Intravenous Q12H  . chlorhexidine  15 mL Mouth Rinse BID  . heparin  5,000 Units Subcutaneous Q8H  . insulin aspart  2-6 Units Subcutaneous Q4H  . pantoprazole (PROTONIX) IV  40 mg Intravenous QHS  . vancomycin  1,000 mg Intravenous Q24H    Continuous Infusions:   . sodium chloride  50 mL/hr at 12/15/12 1233  . fentaNYL infusion INTRAVENOUS    . midazolam (VERSED) infusion 2 mg/hr (12/15/12 1233)    Past Medical History  Diagnosis Date  . Hypertension   . Diabetes mellitus   . Dyslipidemia   . Mini stroke 2005  . Degenerative disc disease   . GERD (gastroesophageal reflux disease)   . Osteopenia 2005  . Coronary artery disease 2005    treated medically  . BPH (benign prostatic hyperplasia)   . DVT (deep venous thrombosis)   . History of chemotherapy   . Diverticulosis     per scan 05/28/12  . Calculus of kidney     per scan 05/28/12  . Hx of radiation therapy 06/14/12 -07/05/12    left chest  . lung ca 11/12/2010    LUL    Past Surgical History  Procedure Date  . No past surgeries   . Pleurex cath  02/08/11    left-sided pleural effusion    Kirkland Hun, RD, LDN Pager #: [phone] After-Hours Pager #: [phone]

## 2012-12-15 NOTE — ED Provider Notes (Addendum)
History     CSN: [id number]  Arrival date & time 12/15/12  0220   First MD Initiated Contact with Patient 12/15/12 [phone]      Chief Complaint  Patient presents with  . Respiratory Distress    (Consider location/radiation/quality/duration/timing/severity/associated sxs/prior treatment) Patient is a 78 y.o. male presenting with shortness of breath. The history is provided by the EMS personnel and the spouse.  Shortness of Breath  The current episode started today. The problem occurs frequently. The problem has been rapidly worsening. Nothing relieves the symptoms. Associated symptoms include chest pain and shortness of breath.    Past Medical History  Diagnosis Date  . Hypertension   . Diabetes mellitus   . Dyslipidemia   . Mini stroke 2005  . Degenerative disc disease   . GERD (gastroesophageal reflux disease)   . Osteopenia 2005  . Coronary artery disease 2005    treated medically  . BPH (benign prostatic hyperplasia)   . DVT (deep venous thrombosis)   . History of chemotherapy   . Diverticulosis     per scan 05/28/12  . Calculus of kidney     per scan 05/28/12  . Hx of radiation therapy 06/14/12 -07/05/12    left chest  . lung ca 11/12/2010    LUL    Past Surgical History  Procedure Date  . No past surgeries   . Pleurex cath 02/08/11    left-sided pleural effusion    No family history on file.  History  Substance Use Topics  . Smoking status: Former Smoker -- 3.0 packs/day for 25 years    Quit date: 01/17/1986  . Smokeless tobacco: Never Used  . Alcohol Use: No      Review of Systems  Unable to perform ROS: Intubated  Respiratory: Positive for shortness of breath.   Cardiovascular: Positive for chest pain.    Allergies  Penicillins  Home Medications   Current Outpatient Rx  Name  Route  Sig  Dispense  Refill  . ACETAMINOPHEN 500 MG PO TABS   Oral   Take 500 mg by mouth every 6 (six) hours as needed. For pain.         . ALBUTEROL SULFATE  (2.5 MG/3ML) 0.083% IN NEBU   Nebulization   Take 3 mLs (2.5 mg total) by nebulization every 6 (six) hours as needed for wheezing.   75 mL   12   . AMLODIPINE BESY-BENAZEPRIL HCL 5-10 MG PO CAPS   Oral   Take 1 capsule by mouth daily.           . ASPIRIN 81 MG PO TABS   Oral   Take 81 mg by mouth daily.          Marland Kitchen DEXAMETHASONE 4 MG PO TABS   Oral   Take 4 mg by mouth 2 (two) times daily with a meal. 2 tabs by mouth BID, the day before, the day of and the day after chemotherapy         . GLYBURIDE-METFORMIN 5-500 MG PO TABS   Oral   Take 2 tablets by mouth 2 (two) times daily.           Marland Kitchen HYDROCODONE-ACETAMINOPHEN 5-500 MG PO TABS   Oral   Take 1-2 tablets by mouth every 6 (six) hours as needed for pain. For pain.   60 tablet   0   . HYDROXYZINE HCL 25 MG PO TABS   Oral   Take 1 tablet (25 mg total) by  mouth every 4 (four) hours as needed for itching. Take 1 tablet by mouth every 8 hours as needed for itching   60 tablet   0   . HYDROXYZINE HCL 25 MG PO TABS      TAKE 1 TABLET BY MOUTH EVERY 8 HOURS AS NEEDED FOR ITCHING   30 tablet   0   . LIDOCAINE-PRILOCAINE 2.5-2.5 % EX CREA   Topical   Apply 1 application topically as needed. Apply to [facility] A Cath site before chemotherapy.          . MORPHINE SULFATE ER 30 MG PO TBCR   Oral   Take 1 tablet (30 mg total) by mouth 2 (two) times daily.   60 tablet   0   . PANTOPRAZOLE SODIUM 40 MG PO TBEC   Oral   Take 1 tablet (40 mg total) by mouth 2 (two) times daily.   30 tablet   3   . PRESCRIPTION MEDICATION      Chemo=last dose 2 weeks ago. Next dose in 1 week. Dr Tanja Port           BP 102/71  Pulse 27  Temp 96.4 F (35.8 C)  Resp 20  Ht 5' 4.96" (1.65 m)  Wt 185 lb 3 oz (84 kg)  BMI 30.85 kg/m2  SpO2 91%  Physical Exam  Constitutional: He appears distressed.  HENT:  Head: Normocephalic and atraumatic.  Eyes: Conjunctivae normal are normal. Pupils are equal, round, and reactive to  light.  Neck: Normal range of motion. Neck supple.  Cardiovascular: Normal heart sounds and intact distal pulses.  Tachycardia present.   Pulmonary/Chest: He is in respiratory distress. He has rales.  Abdominal: Soft. Bowel sounds are normal.  Neurological:       Not alert,  Not oriented.  Some purposeful sponteaneous movement of all four extremities  Skin: Skin is warm. He is diaphoretic.    ED Course  Procedures (including critical care time)   Labs Reviewed  CBC WITH DIFFERENTIAL  COMPREHENSIVE METABOLIC PANEL  PROTIME-INR  APTT  LACTIC ACID, PLASMA  CULTURE, BLOOD (ROUTINE X 2)  CULTURE, BLOOD (ROUTINE X 2)  URINALYSIS, ROUTINE W REFLEX MICROSCOPIC  PRO B NATRIURETIC PEPTIDE  URINE CULTURE  BLOOD GAS, ARTERIAL   Dg Chest Portable 1 View  12/15/2012  *RADIOLOGY REPORT*  Clinical Data: Assess endotracheal tube placement.  PORTABLE CHEST - 1 VIEW  Comparison: CT of the chest performed 10/17/2012, and chest radiograph performed 09/10/2012  Findings: The patient's endotracheal tube is seen ending within the right mainstem bronchus, nearly 2 cm deep to the carina.  This should be retracted 4-5 cm.  The lungs are hypoexpanded.  There has been significant interval increase in a moderate to large left-sided pleural effusion, demonstrating mild loculation.  Associated left-sided airspace opacification is noted.  Vascular crowding is seen; the right lung is otherwise grossly unremarkable.  No pneumothorax is identified.  The cardiomediastinal silhouette is difficult to fully assess, but appears borderline normal in size.  A right-sided chest port is noted ending about the distal SVC.  No acute osseous abnormalities are identified.  IMPRESSION:  1.  Endotracheal tube seen ending within the right mainstem bronchus, nearly 2 cm deep to the carina.  This should be retracted 4-5 cm. 2.  Lungs hypoexpanded.  Significant interval increase in moderate to large left-sided pleural effusion, demonstrating  mild loculation.  Associated left-sided airspace opacification noted.  These results were called by telephone on 12/15/2012 at 02:48  a.m. to Dr. Mendel Ryder, who verbally acknowledged these results.   Original Report Authenticated By: Tonia Ghent, M.D.      No diagnosis found.   INTUBATION Performed by: Rosanne Ashing  Required items: required blood products, implants, devices, and special equipment available Patient identity confirmed: provided demographic data and hospital-assigned identification number Time out: Immediately prior to procedure a "time out" was called to verify the correct patient, procedure, equipment, support staff and site/side marked as required.  Indications: resp failure  Intubation method:Laryngoscopy   Preoxygenation: BVM  Sedatives: 20mg Etomidate Paralytic: 100mg Succinylcholine  Tube Size: 7.5 cuffed  Post-procedure assessment: chest rise and ETCO2 monitor Breath sounds: equal and absent over the epigastrium Tube secured with: ETT holder Chest x-ray interpreted by radiologist and me.  Chest x-ray findings: repositioned endotracheal tube in appropriate position  Patient tolerated the procedure well with no immediate complications. CRITICAL CARE Performed by: Rosanne Ashing   Total critical care time:  Critical care time was exclusive of separately billable procedures and treating other patients.  Critical care was necessary to treat or prevent imminent or life-threatening deterioration.  Critical care was time spent personally by me on the following activities: development of treatment plan with patient and/or surrogate as well as nursing, discussions with consultants, evaluation of patient's response to treatment, examination of patient, obtaining history from patient or surrogate, ordering and performing treatments and interventions, ordering and review of laboratory studies, ordering and review of radiographic studies, pulse  oximetry and re-evaluation of patient's condition.  Date: 12/15/2012  Rate: 101  Rhythm: sinus tachycardia  QRS Axis: left  Intervals: normal  ST/T Wave abnormalities: nonspecific ST changes  Conduction Disutrbances:none  Narrative Interpretation:   Old EKG Reviewed: unchanged   MDM  + resp failure,  Intubated.  Recent pneumonia, a wait labs.  Critical care aware,  Will assess in ed        Rosanne Ashing, MD 12/15/12 [id number]  Adiva Boettner Lytle Michaels, MD 12/15/12 [phone]

## 2012-12-15 NOTE — ED Notes (Signed)
Patient from home. Wife called EMS for difficulty breathing & chest pain. At EMS arrival, patient had rhonci in bilateral upper fields and rales to bilateral lower lung fields. Nitro 0.4 mg x 2 given. Chest pain resolved. Unable to speak in complete sentances and in distress. Patient placed on CPAP @ 98%.  20g LAC placed. BP 150/110 after 2nd Nitro. ST on monitor (110) with multiple PVCs. Patient began to have decreased LOC en route. At ED arrival, patient in obvious respiratory distress on CPAP. RSI completed by EDP.

## 2012-12-15 NOTE — ED Notes (Signed)
REPORT GIVEN TO 3100 UNIT NURSE , WILL TRANSPORT PT. WITH RESPIRATORY THERAPIST.

## 2012-12-16 ENCOUNTER — Inpatient Hospital Stay (HOSPITAL_COMMUNITY): Payer: Medicare Other

## 2012-12-16 LAB — BLOOD GAS, ARTERIAL
Bicarbonate: 23.6 mEq/L (ref 20.0–24.0)
FIO2: 0.4 %
O2 Saturation: 97.2 %
PEEP: 5 cmH2O
TCO2: 24.6 mmol/L (ref 0–100)
pO2, Arterial: 80.3 mmHg (ref 80.0–100.0)

## 2012-12-16 LAB — CBC
Platelets: 244 10*3/uL (ref 150–400)
RBC: 3.77 MIL/uL — ABNORMAL LOW (ref 4.22–5.81)
RDW: 14.5 % (ref 11.5–15.5)
WBC: 5.9 10*3/uL (ref 4.0–10.5)

## 2012-12-16 LAB — BASIC METABOLIC PANEL
CO2: 22 mEq/L (ref 19–32)
Chloride: 105 mEq/L (ref 96–112)
Creatinine, Ser: 0.98 mg/dL (ref 0.50–1.35)
GFR calc Af Amer: 87 mL/min — ABNORMAL LOW (ref >90)
Potassium: 4 mEq/L (ref 3.5–5.1)
Sodium: 138 mEq/L (ref 135–145)

## 2012-12-16 LAB — PHOSPHORUS: Phosphorus: 3.9 mg/dL (ref 2.3–4.6)

## 2012-12-16 LAB — URINE CULTURE: Culture: NO GROWTH

## 2012-12-16 LAB — GLUCOSE, CAPILLARY
Glucose-Capillary: 88 mg/dL (ref 70–99)
Glucose-Capillary: 113 mg/dL — ABNORMAL HIGH (ref 70–99)

## 2012-12-16 LAB — MAGNESIUM: Magnesium: 1.6 mg/dL (ref 1.5–2.5)

## 2012-12-16 MED ORDER — MIDAZOLAM HCL 2 MG/2ML IJ SOLN: 2.0000 mg | INTRAMUSCULAR | Status: DC | PRN

## 2012-12-16 MED ORDER — ACETAMINOPHEN 650 MG RE SUPP
650.0000 mg | RECTAL | Status: DC | PRN
  Administered 2012-12-29 – 2012-12-31 (×3): 650 mg via RECTAL
  Filled 2012-12-16 (×3): qty 1

## 2012-12-16 MED ORDER — DEXMEDETOMIDINE HCL IN NACL 200 MCG/50ML IV SOLN
0.2000 ug/kg/h | INTRAVENOUS | Status: AC
  Administered 2012-12-16: 0.4 ug/kg/h via INTRAVENOUS
  Administered 2012-12-16: 0.6 ug/kg/h via INTRAVENOUS
  Administered 2012-12-16: 0.5 ug/kg/h via INTRAVENOUS
  Administered 2012-12-17 (×2): 0.6 ug/kg/h via INTRAVENOUS
  Administered 2012-12-17 – 2012-12-18 (×3): 0.4 ug/kg/h via INTRAVENOUS
  Administered 2012-12-18: 0.2 ug/kg/h via INTRAVENOUS
  Administered 2012-12-18: 0.4 ug/kg/h via INTRAVENOUS
  Administered 2012-12-19: 0.399 ug/kg/h via INTRAVENOUS
  Administered 2012-12-19: 0.4 ug/kg/h via INTRAVENOUS
  Filled 2012-12-16 (×13): qty 50

## 2012-12-16 MED ORDER — ACETAMINOPHEN 160 MG/5ML PO SOLN
650.0000 mg | Freq: Four times a day (QID) | ORAL | Status: DC | PRN
  Administered 2012-12-23 – 2012-12-24 (×2): 650 mg via ORAL
  Filled 2012-12-16 (×2): qty 20.3

## 2012-12-16 MED ORDER — FENTANYL CITRATE 0.05 MG/ML IJ SOLN
25.0000 ug | INTRAMUSCULAR | Status: DC | PRN
  Administered 2012-12-20: 50 ug via INTRAVENOUS
  Administered 2013-01-01: 25 ug via INTRAVENOUS
  Filled 2012-12-16 (×2): qty 2

## 2012-12-16 NOTE — Progress Notes (Addendum)
Name: Johnathan Bowman MRN: [id number] DOB: [birth year]    LOS: 1  PULMONARY / CRITICAL CARE MEDICINE  HPI:   78 years old male with PMH relevant for HTN, DM, dyslipidemia, GERD, CAD, DVT. Diagnosed with metastatic (stage IV) non small cell lung cancer in November 2011 (LUL). Treated since then with chemotherapy. Last cycle given in October 2013. He has history of a left sided malignant pleural effusion (cytology from 01/05/12) and had a Pleurex catheter placed in the past. History obtained from records. Patient unable to provide history due to intubation. Wife was present at bedside but very poor historian. Apparently he has been experiencing progressive SOB, productive cough and chills for one week. On the day of admission he was found by his wife with AMS, some questionable chest pain and worsening respiratory distress. At arrival to the ED he was hypoxic and was started on BIPAP. His mental status deteriorated and had to be intubated. At the time of my exam the patient is intubated on mechanical ventilation, sedated with propofol, hemodynamically stable.  Vital Signs: Temp:  [85 F (29.4 C)-100.6 F (38.1 C)] 100.6 F (38.1 C) (12/29 0900) Pulse Rate:  [67-119] 112  (12/29 0800) Resp:  [19-22] 20  (12/29 0900) BP: (90-154)/(50-109) 124/66 mmHg (12/29 0900) SpO2:  [87 %-99 %] 87 % (12/29 0800) FiO2 (%):  [40 %] 40 % (12/29 0800) Weight:  [85.2 kg (187 lb 13.3 oz)] 85.2 kg (187 lb 13.3 oz) (12/29 0500)  Physical Examination: General:  Intubated, mechanically ventilated, no acute distress Neuro:  Sedated, synchronous, nonfocal. HEENT:  PERRL, pink conjunctivae, moist membranes Neck:  Supple, no JVD   Cardiovascular:  RRR, no M/R/G Lungs:  Bilateral diffuse crackles, no wheezing Abdomen:  Soft, nontender, nondistended, bowel sounds present Musculoskeletal:  Moves all extremities, 1+ pedal edema Skin:  No rash  ASSESSMENT AND PLAN  PULMONARY  Lab 12/16/12 0350 12/15/12 0545 12/15/12  0318  PHART 7.461* 7.381 7.113*  PCO2ART 33.5* 41.3 20.6*  PO2ART 80.3 94.2 41.0*  HCO3 23.6 24.3* 6.6*  O2SAT 97.2 97.6 60.0   Ventilator Settings: Vent Mode:  [-] PRVC FiO2 (%):  [40 %] 40 % Set Rate:  [20 bmp] 20 bmp Vt Set:  [520 mL] 520 mL PEEP:  [5 cmH20] 5 cmH20 Plateau Pressure:  [19 cmH20-24 cmH20] 19 cmH20 CXR:  Endotracheal tube in right main stem bronchus. Possible increased left pleural effusion with LLL opacification. ETT:  7.5. Was retracted in the ED.  A:   1) Health care associated pneumonia 2) Acute on chronic respiratory failure 3) Loculated malignant left pleural effusion P:   - Begin PS trials - PRVC. Vt: 8cc/kg, RR: 16 PEEP: 5, FiO2 40%. - VAP prevention order set. - Daily awakening and SBT. - CT noted but no active interventions for now. - May need Pleurex catheter placement but will need more discussion with family regarding plan of care, no family bedside on 12/29. - Continue Cefepime / vancomycin. - Albuterol/ipratropium PRN.  CARDIOVASCULAR  Lab 12/15/12 1735 12/15/12 1125 12/15/12 0417 12/15/12 0247 12/15/12 0246  TROPONINI <0.30 <0.30 <0.30 -- --  LATICACIDVEN -- -- -- -- 2.5*  PROBNP -- -- -- 95.3 --   ECG:  Sinus tachycardia Lines: Left port-A-cath. Peripheral IV's  A:  1) Metabolic acidosis per ABG  (only mildly elevated lactate), Chemistry with bicarb of 24. 2) Hemodynamically stable after transient hypotension due to sedation. P:  - KVO IVF. - Troponin negative.  RENAL WBC 6.4 RBC 4.16 Hemoglobin 10.8  HCT 37.2 Platelets 271 Sodium 139 Potassium 4.9 Chloride 103 CO2 24 BUN 14 Creatinine 0.97 Calcium 9.1 INR 1.05 BMET    Component Value Date/Time   NA 138 12/16/2012 0623   NA 143 10/17/2012 0956   NA 139 11/07/2011 1621   K 4.0 12/16/2012 0623   K 3.9 10/17/2012 0956   K 4.8* 11/07/2011 1621   CL 105 12/16/2012 0623   CL 105 10/17/2012 0956   CL 97* 11/07/2011 1621   CO2 22 12/16/2012 0623   CO2 30* 10/17/2012 0956   CO2  30 11/07/2011 1621   GLUCOSE 82 12/16/2012 0623   GLUCOSE 37 Repeated and Verified* 10/17/2012 0956   GLUCOSE 92 11/07/2011 1621   BUN 20 12/16/2012 0623   BUN 8.0 10/17/2012 0956   BUN 11 11/07/2011 1621   CREATININE 0.98 12/16/2012 0623   CREATININE 0.8 10/17/2012 0956   CREATININE 0.9 11/07/2011 1621   CALCIUM 8.8 12/16/2012 0623   CALCIUM 8.9 10/17/2012 0956   CALCIUM 9.4 11/07/2011 1621   GFRNONAA 75* 12/16/2012 0623   GFRAA 87* 12/16/2012 0623   Foley:  12/15/12  Intake/Output Summary (Last 24 hours) at 12/16/12 1032 Last data filed at 12/16/12 1000  Gross per 24 hour  Intake 2361.83 ml  Output   1005 ml  Net 1356.83 ml   A:   1) Normal kidney function, no metabolic acidosis as ABG reported. P:   - Will follow BMP. - Replace electrolytes as needed.  GASTROINTESTINAL  Lab 12/15/12 0417 12/15/12 0246  AST 11 12  ALT 5 6  ALKPHOS 93 96  BILITOT 0.3 0.2*  PROT 6.2 6.5  ALBUMIN 3.0* 3.1*    A:   1) No issues P:   - GI prophylaxis with protonix. - Continue TF.  HEMATOLOGIC  Lab 12/16/12 0623 12/15/12 0246  HGB 10.2* 10.8*  HCT 32.7* 37.2*  PLT 244 271  INR -- 1.05  APTT -- 34   A:   1) Anemia likely due to chronic disease P:  - we will follow CBC  INFECTIOUS  Lab 12/16/12 0623 12/15/12 0417 12/15/12 0246  WBC 5.9 -- 6.4  PROCALCITON -- <0.10 --   Cultures: Blood 12/28>>> Urine 12/28>>> Sputum 12/28>>> Antibiotics: Cefepime 12/28>>> Vancomycin 12/28>>>  A:   1) Health care associated pneumonia P:   - Antibiotics as above - May require drainage of fluid but will monitor for now. - Narrow abx as cx result.  ENDOCRINE  Lab 12/16/12 0811 12/16/12 0402 12/15/12 2328 12/15/12 1915 12/15/12 1700  GLUCAP 116* 88 122* 82 80   A:   1) DM P:   - Novolog sliding scale. (ICU protocol)  NEUROLOGIC  A:   1) Altered mental status likely secondary to hypoxia and metabolic acidosis. P:   - CT scan of the head with no acute stroke. -  Change sedation to versed/fentanyl. - Add precedex to maintain as awake as possible.  BEST PRACTICE / DISPOSITION - Level of Care:  ICU - Primary Service:  PCCM - Consultants:  None - Code Status:  Full code (personally discussed with the patient's wife) - Diet:  NPO - DVT Px:  heparin - GI Px: protonix - Skin Integrity:  Intact - Social / Family:  Wife updated at bedside.  The patient is critically ill with multiple organ systems failure and requires high complexity decision making for assessment and support, frequent evaluation and titration of therapies, application of advanced monitoring technologies and extensive interpretation of multiple databases.  Critical Care Time devoted to patient care services described in this note is: 35 min.  Alyson Reedy, M.D. [facility] Pulmonary/Critical Care Medicine. Pager: [phone]. After hours pager: [phone].

## 2012-12-17 ENCOUNTER — Inpatient Hospital Stay (HOSPITAL_COMMUNITY): Payer: Medicare Other

## 2012-12-17 ENCOUNTER — Encounter (HOSPITAL_COMMUNITY): Payer: Self-pay | Admitting: *Deleted

## 2012-12-17 DIAGNOSIS — C3490 Malignant neoplasm of unspecified part of unspecified bronchus or lung: Secondary | ICD-10-CM

## 2012-12-17 DIAGNOSIS — C801 Malignant (primary) neoplasm, unspecified: Secondary | ICD-10-CM

## 2012-12-17 DIAGNOSIS — I82409 Acute embolism and thrombosis of unspecified deep veins of unspecified lower extremity: Secondary | ICD-10-CM

## 2012-12-17 LAB — CBC
HCT: 30.2 % — ABNORMAL LOW (ref 39.0–52.0)
Hemoglobin: 9.3 g/dL — ABNORMAL LOW (ref 13.0–17.0)
MCH: 26.9 pg (ref 26.0–34.0)
MCHC: 30.8 g/dL (ref 30.0–36.0)
RBC: 3.46 MIL/uL — ABNORMAL LOW (ref 4.22–5.81)

## 2012-12-17 LAB — GLUCOSE, CAPILLARY
Glucose-Capillary: 148 mg/dL — ABNORMAL HIGH (ref 70–99)
Glucose-Capillary: 154 mg/dL — ABNORMAL HIGH (ref 70–99)
Glucose-Capillary: 143 mg/dL — ABNORMAL HIGH (ref 70–99)
Glucose-Capillary: 162 mg/dL — ABNORMAL HIGH (ref 70–99)

## 2012-12-17 LAB — BLOOD GAS, ARTERIAL
Acid-Base Excess: 0.7 mmol/L (ref 0.0–2.0)
Drawn by: 13898
FIO2: 0.4 %
MECHVT: 520 mL
Patient temperature: 98.6
RATE: 16 resp/min
TCO2: 25.8 mmol/L (ref 0–100)

## 2012-12-17 LAB — BASIC METABOLIC PANEL
BUN: 25 mg/dL — ABNORMAL HIGH (ref 6–23)
CO2: 24 mEq/L (ref 19–32)
Calcium: 9.1 mg/dL (ref 8.4–10.5)
Glucose, Bld: 125 mg/dL — ABNORMAL HIGH (ref 70–99)
Potassium: 3.9 mEq/L (ref 3.5–5.1)
Sodium: 134 mEq/L — ABNORMAL LOW (ref 135–145)

## 2012-12-17 LAB — PHOSPHORUS: Phosphorus: 4 mg/dL (ref 2.3–4.6)

## 2012-12-17 MED ORDER — FUROSEMIDE 10 MG/ML IJ SOLN
40.0000 mg | Freq: Three times a day (TID) | INTRAMUSCULAR | Status: AC
  Administered 2012-12-17 (×2): 40 mg via INTRAVENOUS
  Filled 2012-12-17 (×2): qty 4

## 2012-12-17 MED ORDER — POTASSIUM CHLORIDE 20 MEQ/15ML (10%) PO LIQD
40.0000 meq | Freq: Three times a day (TID) | ORAL | Status: AC
  Administered 2012-12-17 (×2): 40 meq
  Filled 2012-12-17 (×2): qty 30

## 2012-12-17 NOTE — Progress Notes (Signed)
Received a call from X-ray regarding the results of the morning chest x-ray; consider withdrawalling ETT tube 3 cm. Notified Respiratory.

## 2012-12-17 NOTE — Progress Notes (Signed)
Name: Johnathan Bowman MRN: [id number] DOB: [birth year]    LOS: 2  PULMONARY / CRITICAL CARE MEDICINE  HPI:   78 years old male with PMH relevant for HTN, DM, dyslipidemia, GERD, CAD, DVT. Diagnosed with metastatic (stage IV) non small cell lung cancer in November 2011 (LUL). Treated since then with chemotherapy. Last cycle given in October 2013. He has history of a left sided malignant pleural effusion (cytology from 01/05/12) and had a Pleurex catheter placed in the past. History obtained from records. Patient unable to provide history due to intubation. Wife was present at bedside but very poor historian. Apparently he has been experiencing progressive SOB, productive cough and chills for one week. On the day of admission he was found by his wife with AMS, some questionable chest pain and worsening respiratory distress. At arrival to the ED he was hypoxic and was started on BIPAP. His mental status deteriorated and had to be intubated. At the time of my exam the patient is intubated on mechanical ventilation, sedated with propofol, hemodynamically stable.  Vital Signs: Temp:  [98.4 F (36.9 C)-100 F (37.8 C)] 98.8 F (37.1 C) (12/30 1100) Pulse Rate:  [61-123] 89  (12/30 1119) Resp:  [16-24] 16  (12/30 1119) BP: (93-150)/(54-95) 144/72 mmHg (12/30 1119) SpO2:  [92 %-99 %] 98 % (12/30 1119) FiO2 (%):  [40 %] 40 % (12/30 1119) Weight:  [86.9 kg (191 lb 9.3 oz)] 86.9 kg (191 lb 9.3 oz) (12/30 0400)  Physical Examination: General:  Intubated, mechanically ventilated, no acute distress Neuro:  Sedated, synchronous, nonfocal. HEENT:  PERRL, pink conjunctivae, moist membranes Neck:  Supple, no JVD   Cardiovascular:  RRR, no M/R/G Lungs:  Bilateral diffuse crackles, no wheezing Abdomen:  Soft, nontender, nondistended, bowel sounds present Musculoskeletal:  Moves all extremities, 1+ pedal edema Skin:  No rash  ASSESSMENT AND PLAN  PULMONARY  Lab 12/17/12 0355 12/16/12 0350 12/15/12 0545  12/15/12 0318  PHART 7.425 7.461* 7.381 7.113*  PCO2ART 38.2 33.5* 41.3 20.6*  PO2ART 77.0* 80.3 94.2 41.0*  HCO3 24.6* 23.6 24.3* 6.6*  O2SAT 96.7 97.2 97.6 60.0   Ventilator Settings: Vent Mode:  [-] PSV FiO2 (%):  [40 %] 40 % Set Rate:  [16 bmp] 16 bmp Vt Set:  [520 mL] 520 mL PEEP:  [5 cmH20] 5 cmH20 Pressure Support:  [5 cmH20] 5 cmH20 Plateau Pressure:  [22 cmH20-23 cmH20] 22 cmH20 CXR:  Endotracheal tube in right main stem bronchus. Possible increased left pleural effusion with LLL opacification. ETT:  7.5. Was retracted in the ED.  A:   1) Health care associated pneumonia 2) Acute on chronic respiratory failure 3) Loculated malignant left pleural effusion P:   - SBT today, will need to address code status with family prior to extubation unless does very well today. - PRVC. Vt: 8cc/kg, RR: 16 PEEP: 5, FiO2 40%. - VAP prevention order set. - Daily awakening and SBT. - CT noted but no active interventions for now. - May need Pleurex catheter placement but will need more discussion with family regarding plan of care, no family bedside on 12/30. - Continue Cefepime / vancomycin. - Albuterol/ipratropium PRN.  CARDIOVASCULAR  Lab 12/15/12 1735 12/15/12 1125 12/15/12 0417 12/15/12 0247 12/15/12 0246  TROPONINI <0.30 <0.30 <0.30 -- --  LATICACIDVEN -- -- -- -- 2.5*  PROBNP -- -- -- 95.3 --   ECG:  Sinus tachycardia Lines: Left port-A-cath. Peripheral IV's  A:  1) Metabolic acidosis per ABG  (only mildly elevated lactate), Chemistry  with bicarb of 24. 2) Hemodynamically stable after transient hypotension due to sedation. P:  - KVO IVF. - Troponin negative.  RENAL WBC 6.4 RBC 4.16 Hemoglobin 10.8 HCT 37.2 Platelets 271 Sodium 139 Potassium 4.9 Chloride 103 CO2 24 BUN 14 Creatinine 0.97 Calcium 9.1 INR 1.05 BMET    Component Value Date/Time   NA 134* 12/17/2012 0500   NA 143 10/17/2012 0956   NA 139 11/07/2011 1621   K 3.9 12/17/2012 0500   K 3.9 10/17/2012 0956     K 4.8* 11/07/2011 1621   CL 100 12/17/2012 0500   CL 105 10/17/2012 0956   CL 97* 11/07/2011 1621   CO2 24 12/17/2012 0500   CO2 30* 10/17/2012 0956   CO2 30 11/07/2011 1621   GLUCOSE 125* 12/17/2012 0500   GLUCOSE 37 Repeated and Verified* 10/17/2012 0956   GLUCOSE 92 11/07/2011 1621   BUN 25* 12/17/2012 0500   BUN 8.0 10/17/2012 0956   BUN 11 11/07/2011 1621   CREATININE 0.91 12/17/2012 0500   CREATININE 0.8 10/17/2012 0956   CREATININE 0.9 11/07/2011 1621   CALCIUM 9.1 12/17/2012 0500   CALCIUM 8.9 10/17/2012 0956   CALCIUM 9.4 11/07/2011 1621   GFRNONAA 77* 12/17/2012 0500   GFRAA 90* 12/17/2012 0500   Foley:  12/15/12  Intake/Output Summary (Last 24 hours) at 12/17/12 1152 Last data filed at 12/17/12 1025  Gross per 24 hour  Intake 1042.42 ml  Output   1175 ml  Net -132.58 ml   A:   1) Normal kidney function, no metabolic acidosis as ABG reported. P:   - Will follow BMP. - Replace electrolytes as needed. - Lasix as ordered. - Electrolyte replacement as needed.  GASTROINTESTINAL  Lab 12/15/12 0417 12/15/12 0246  AST 11 12  ALT 5 6  ALKPHOS 93 96  BILITOT 0.3 0.2*  PROT 6.2 6.5  ALBUMIN 3.0* 3.1*   A:   1) No issues P:   - GI prophylaxis with protonix. - Continue TF.  HEMATOLOGIC  Lab 12/17/12 0500 12/16/12 0623 12/15/12 0246  HGB 9.3* 10.2* 10.8*  HCT 30.2* 32.7* 37.2*  PLT 241 244 271  INR -- -- 1.05  APTT -- -- 34   A:   1) Anemia likely due to chronic disease P:  - we will follow CBC  INFECTIOUS  Lab 12/17/12 0500 12/16/12 0623 12/15/12 0417 12/15/12 0246  WBC 5.9 5.9 -- 6.4  PROCALCITON -- -- <0.10 --   Cultures: Blood 12/28>>>NTD Urine 12/28>>>NTD Sputum 12/28>>>NTD Antibiotics: Cefepime 12/28>>> Vancomycin 12/28>>>  A:   1) Health care associated pneumonia P:   - Antibiotics as above, will narrow if cultures remain negative by AM, would change to rocephin for 5 more days. - May require drainage of fluid but will  monitor for now. - Narrow abx as cx result.  ENDOCRINE  Lab 12/17/12 0338 12/16/12 2308 12/16/12 2029 12/16/12 1603 12/16/12 1200  GLUCAP 147* 189* 193* 132* 113*   A:   1) DM P:   - Novolog sliding scale. (ICU protocol)  NEUROLOGIC  A:   1) Altered mental status likely secondary to hypoxia and metabolic acidosis. P:   - CT scan of the head with no acute stroke. - Change sedation to versed/fentanyl. - Add precedex to maintain as awake as possible.  No family again bedside today, will need to discuss goals of care, will call in the afternoon.  The patient is critically ill with multiple organ systems failure and requires high complexity decision  making for assessment and support, frequent evaluation and titration of therapies, application of advanced monitoring technologies and extensive interpretation of multiple databases.   Critical Care Time devoted to patient care services described in this note is: 35 min.  Alyson Reedy, M.D. [facility] Pulmonary/Critical Care Medicine. Pager: [phone]. After hours pager: [phone].

## 2012-12-17 NOTE — Progress Notes (Signed)
The patient had emesis this afternoon; currently tube feedings on hold.   Stop tube feeding; continue q4h CBG checks;   NG/OG to intermittent suction.   KUB to follow.

## 2012-12-17 NOTE — Progress Notes (Signed)
RT Note: ET tube out to 23, advanced to 26 per xray.

## 2012-12-17 NOTE — Progress Notes (Signed)
  Pt had emesis x2. Small amount, tan color, likely tube feed.  Elink notified.  Elink stated they will place orders in Epic. TF stopped at this time. Pt inline suctioned. O2 sats 99%.   Will continue to monitor.

## 2012-12-17 NOTE — Progress Notes (Deleted)
RT Note: Et tube out to 23 cm  Advanced ET tube to 26cm per Cr

## 2012-12-17 NOTE — Progress Notes (Signed)
Dr. Molli Knock asked me to try to get in touch with the patients wife in order to discuss the patients plan of care. I have attempted to contact the wife but was unsuccessful. I tried the number on our contact sheet, whoever I spoke with gave me her a cell phone number and I tried to call that number as well.  Will continue to contact wife and will pass a long in report.

## 2012-12-17 NOTE — Progress Notes (Signed)
Sputum culture obtained via ett and taken to lab.

## 2012-12-17 NOTE — Progress Notes (Signed)
UR completed 

## 2012-12-18 ENCOUNTER — Inpatient Hospital Stay (HOSPITAL_COMMUNITY): Payer: Medicare Other

## 2012-12-18 DIAGNOSIS — J90 Pleural effusion, not elsewhere classified: Secondary | ICD-10-CM

## 2012-12-18 DIAGNOSIS — R609 Edema, unspecified: Secondary | ICD-10-CM

## 2012-12-18 LAB — BLOOD GAS, ARTERIAL
Acid-Base Excess: 3.1 mmol/L — ABNORMAL HIGH (ref 0.0–2.0)
Drawn by: 23588
FIO2: 0.4 %
MECHVT: 520 mL
TCO2: 28.2 mmol/L (ref 0–100)
pCO2 arterial: 40 mmHg (ref 35.0–45.0)
pH, Arterial: 7.443 (ref 7.350–7.450)
pO2, Arterial: 71.8 mmHg — ABNORMAL LOW (ref 80.0–100.0)

## 2012-12-18 LAB — TRIGLYCERIDES: Triglycerides: 116 mg/dL (ref <150)

## 2012-12-18 LAB — PHOSPHORUS: Phosphorus: 4.2 mg/dL (ref 2.3–4.6)

## 2012-12-18 LAB — BASIC METABOLIC PANEL
Calcium: 9.1 mg/dL (ref 8.4–10.5)
GFR calc Af Amer: 74 mL/min — ABNORMAL LOW (ref >90)
GFR calc non Af Amer: 64 mL/min — ABNORMAL LOW (ref >90)
Glucose, Bld: 148 mg/dL — ABNORMAL HIGH (ref 70–99)
Potassium: 3.9 mEq/L (ref 3.5–5.1)
Sodium: 136 mEq/L (ref 135–145)

## 2012-12-18 LAB — CBC
MCH: 27.1 pg (ref 26.0–34.0)
MCHC: 31.6 g/dL (ref 30.0–36.0)
Platelets: 276 10*3/uL (ref 150–400)
RDW: 14.2 % (ref 11.5–15.5)

## 2012-12-18 LAB — GLUCOSE, CAPILLARY: Glucose-Capillary: 179 mg/dL — ABNORMAL HIGH (ref 70–99)

## 2012-12-18 MED ORDER — FUROSEMIDE 10 MG/ML IJ SOLN
40.0000 mg | Freq: Three times a day (TID) | INTRAMUSCULAR | Status: AC
  Administered 2012-12-18: 40 mg via INTRAVENOUS
  Filled 2012-12-18: qty 4

## 2012-12-18 MED ORDER — DEXTROSE 5 % IV SOLN
1.0000 g | INTRAVENOUS | Status: AC
  Administered 2012-12-18 – 2012-12-22 (×5): 1 g via INTRAVENOUS
  Filled 2012-12-18 (×6): qty 10

## 2012-12-18 MED ORDER — PANTOPRAZOLE SODIUM 40 MG PO PACK
40.0000 mg | PACK | Freq: Every day | ORAL | Status: DC
  Administered 2012-12-18 – 2012-12-19 (×2): 40 mg
  Filled 2012-12-18 (×2): qty 20

## 2012-12-18 MED ORDER — JEVITY 1.2 CAL PO LIQD
1000.0000 mL | ORAL | Status: DC
  Administered 2012-12-18: 11:00:00
  Filled 2012-12-18 (×3): qty 1000

## 2012-12-18 MED ORDER — PRO-STAT SUGAR FREE PO LIQD
30.0000 mL | Freq: Three times a day (TID) | ORAL | Status: DC
  Administered 2012-12-18 – 2012-12-19 (×2): 30 mL
  Filled 2012-12-18 (×5): qty 30

## 2012-12-18 MED ORDER — POTASSIUM CHLORIDE 20 MEQ/15ML (10%) PO LIQD
40.0000 meq | Freq: Three times a day (TID) | ORAL | Status: AC
  Administered 2012-12-18: 40 meq
  Filled 2012-12-18 (×2): qty 30

## 2012-12-18 MED ORDER — POTASSIUM CHLORIDE 20 MEQ/15ML (10%) PO LIQD
ORAL | Status: AC
  Filled 2012-12-18: qty 30

## 2012-12-18 MED ORDER — FUROSEMIDE 10 MG/ML IJ SOLN
INTRAMUSCULAR | Status: AC
  Filled 2012-12-18: qty 4

## 2012-12-18 NOTE — Progress Notes (Signed)
  New OG verified by auscultation by 2 RNs and confirmed by KUB. Pt had no residual  when OG on LIS for 2 hours. Tube feed restarted at 74mL/hr. Will advance back up to goal of 43mL/hr.

## 2012-12-18 NOTE — Progress Notes (Signed)
NUTRITION FOLLOW UP  Intervention:   D/C Promote Jevity 1.2 @ 25 ml/hr, increase by 10 ml every 4 hours to goal rate of 45 ml/hr 30 ml Prostat QID  TF regimen will provide: 1696 kcal, 120 grams protein, 875 ml H2O  Nutrition Dx:   Inadequate oral intake related to inability to eat as evidenced by NPO status; ongoing.  Goal:   Enteral nutrition to provide 60-70% of estimated calorie needs (22-25 kcals/kg ideal body weight) and >/= 90% of estimated protein needs, based on ASPEN guidelines for permissive underfeeding in critically ill obese individuals; met.   NEW GOAL: Pt to meet >/= 90% of their estimated nutrition needs.  Monitor:   TF tolerance, vent status  Assessment:   Patient is currently intubated on ventilator support. Per MD note plan of family meeting on 1/2. MV: 8.3 Temp:Temp (24hrs), Avg:99.2 F (37.3 C), Min:98.6 F (37 C), Max:99.7 F (37.6 C)   Patient has OGT in place. Promote is infusing @ 25 ml/hr. 30 ml Prostat via tube 6 times per day. Tube feeding regimen currently providing 1200 kcal, 127 grams protein, and 503 ml H2O.   Free water flushes: NA  Total free water: 503 ml per day.  Residuals: 0  Last bm: not documented   Height: Ht Readings from Last 1 Encounters:  12/15/12 5\' 7"  (1.702 m)    Weight Status:   Wt Readings from Last 1 Encounters:  12/18/12 184 lb 4.9 oz (83.6 kg)    Re-estimated needs:  Kcal: 1719 Protein: 110-125 grams Fluid: >1.7 L/day  Skin: no issues noted  Diet Order: NPO   Intake/Output Summary (Last 24 hours) at 12/18/12 1026 Last data filed at 12/18/12 1000  Gross per 24 hour  Intake 1423.79 ml  Output   4535 ml  Net -3111.21 ml    Labs:   Lab 12/18/12 0400 12/17/12 0500 12/16/12 0623  NA 136 134* 138  K 3.9 3.9 4.0  CL 99 100 105  CO2 28 24 22   BUN 31* 25* 20  CREATININE 1.06 0.91 0.98  CALCIUM 9.1 9.1 8.8  MG 1.7 1.8 1.6  PHOS 4.2 4.0 3.9  GLUCOSE 148* 125* 82    CBG (last 3)   Basename  12/18/12 0424 12/18/12 12/17/12 1941  GLUCAP 150* 168* 162*    Scheduled Meds:   . antiseptic oral rinse  15 mL Mouth Rinse QID  . ceFEPime (MAXIPIME) IV  1 g Intravenous Q12H  . chlorhexidine  15 mL Mouth Rinse BID  . feeding supplement  30 mL Per Tube 6 X Daily  . feeding supplement (PROMOTE)  1,000 mL Per Tube Q24H  . furosemide  40 mg Intravenous Q8H  . heparin  5,000 Units Subcutaneous Q8H  . insulin aspart  2-6 Units Subcutaneous Q4H  . multivitamin  5 mL Per Tube Daily  . pantoprazole (PROTONIX) IV  40 mg Intravenous QHS  . potassium chloride  40 mEq Per Tube TID  . vancomycin  1,000 mg Intravenous Q24H    Continuous Infusions:   . dexmedetomidine 0.2 mcg/kg/hr (12/18/12 1000)  . fentaNYL infusion INTRAVENOUS Stopped (12/16/12 1330)  . midazolam (VERSED) infusion Stopped (12/16/12 1330)    Kendell Bane RD, LDN, CNSC [phone] Pager [phone] After Hours Pager

## 2012-12-18 NOTE — Progress Notes (Addendum)
Name: Johnathan Bowman MRN: [id number] DOB: [birth year]    LOS: 3  PULMONARY / CRITICAL CARE MEDICINE  HPI:   78 years old male with PMH relevant for HTN, DM, dyslipidemia, GERD, CAD, DVT. Diagnosed with metastatic (stage IV) non small cell lung cancer in November 2011 (LUL). Treated since then with chemotherapy. Last cycle given in October 2013. He has history of a left sided malignant pleural effusion (cytology from 01/05/12) and had a Pleurex catheter placed in the past. History obtained from records. Patient unable to provide history due to intubation. Wife was present at bedside but very poor historian. Apparently he has been experiencing progressive SOB, productive cough and chills for one week. On the day of admission he was found by his wife with AMS, some questionable chest pain and worsening respiratory distress. At arrival to the ED he was hypoxic and was started on BIPAP. His mental status deteriorated and had to be intubated. At the time of my exam the patient is intubated on mechanical ventilation, sedated with propofol, hemodynamically stable.  Vital Signs: Temp:  [98.6 F (37 C)-99.7 F (37.6 C)] 98.6 F (37 C) (12/31 0800) Pulse Rate:  [56-91] 63  (12/31 0800) Resp:  [15-21] 15  (12/31 0800) BP: (104-152)/(56-98) 107/60 mmHg (12/31 0800) SpO2:  [97 %-100 %] 99 % (12/31 0800) FiO2 (%):  [40 %] 40 % (12/31 0800) Weight:  [83.6 kg (184 lb 4.9 oz)] 83.6 kg (184 lb 4.9 oz) (12/31 0441)  Physical Examination: General:  Intubated, mechanically ventilated, no acute distress Neuro:  Sedated but arousable, synchronous, nonfocal. HEENT:  PERRL, pink conjunctivae, moist membranes Neck:  Supple, no JVD   Cardiovascular:  RRR, no M/R/G Lungs:  Bilateral diffuse crackles, no wheezing Abdomen:  Soft, nontender, nondistended, bowel sounds present Musculoskeletal:  Moves all extremities, 1+ pedal edema Skin:  No rash  ASSESSMENT AND PLAN  PULMONARY  Lab 12/18/12 0420 12/17/12 0355  12/16/12 0350 12/15/12 0545 12/15/12 0318  PHART 7.443 7.425 7.461* 7.381 7.113*  PCO2ART 40.0 38.2 33.5* 41.3 20.6*  PO2ART 71.8* 77.0* 80.3 94.2 41.0*  HCO3 27.0* 24.6* 23.6 24.3* 6.6*  O2SAT 92.6 96.7 97.2 97.6 60.0   Ventilator Settings: Vent Mode:  [-] PRVC FiO2 (%):  [40 %] 40 % Set Rate:  [16 bmp] 16 bmp Vt Set:  [520 mL] 520 mL PEEP:  [5 cmH20] 5 cmH20 Pressure Support:  [5 cmH20-10 cmH20] 10 cmH20 Plateau Pressure:  [19 cmH20-20 cmH20] 19 cmH20 CXR:  Endotracheal tube in right main stem bronchus. Possible increased left pleural effusion with LLL opacification. ETT:  7.5. Was retracted in the ED.  A:   1) Health care associated pneumonia 2) Acute on chronic respiratory failure 3) Loculated malignant left pleural effusion P:   - SBT today, will need to address code status with family prior to extubation however, all attempts to reach family thus far has been unsuccessful.  Will consult social work inorder to arrange a family meeting on 1/2. - PS trials. - VAP prevention order set. - Daily awakening and SBT. - CT noted but no active interventions for now. - May need Pleurex catheter placement but will need more discussion with family regarding plan of care, they have been very evasive, as above. - Continue Cefepime/vancomycin. - Albuterol/ipratropium PRN.  CARDIOVASCULAR  Lab 12/15/12 1735 12/15/12 1125 12/15/12 0417 12/15/12 0247 12/15/12 0246  TROPONINI <0.30 <0.30 <0.30 -- --  LATICACIDVEN -- -- -- -- 2.5*  PROBNP -- -- -- 95.3 --   ECG:  Sinus tachycardia Lines: Left port-A-cath. Peripheral IV's  A:  1) Metabolic acidosis per ABG  (only mildly elevated lactate), Chemistry with bicarb of 24. 2) Hemodynamically stable after transient hypotension due to sedation. P:  - KVO IVF. - Troponin negative.  RENAL WBC 6.4 RBC 4.16 Hemoglobin 10.8 HCT 37.2 Platelets 271 Sodium 139 Potassium 4.9 Chloride 103 CO2 24 BUN 14 Creatinine 0.97 Calcium 9.1 INR 1.05 BMET      Component Value Date/Time   NA 136 12/18/2012 0400   NA 143 10/17/2012 0956   NA 139 11/07/2011 1621   K 3.9 12/18/2012 0400   K 3.9 10/17/2012 0956   K 4.8* 11/07/2011 1621   CL 99 12/18/2012 0400   CL 105 10/17/2012 0956   CL 97* 11/07/2011 1621   CO2 28 12/18/2012 0400   CO2 30* 10/17/2012 0956   CO2 30 11/07/2011 1621   GLUCOSE 148* 12/18/2012 0400   GLUCOSE 37 Repeated and Verified* 10/17/2012 0956   GLUCOSE 92 11/07/2011 1621   BUN 31* 12/18/2012 0400   BUN 8.0 10/17/2012 0956   BUN 11 11/07/2011 1621   CREATININE 1.06 12/18/2012 0400   CREATININE 0.8 10/17/2012 0956   CREATININE 0.9 11/07/2011 1621   CALCIUM 9.1 12/18/2012 0400   CALCIUM 8.9 10/17/2012 0956   CALCIUM 9.4 11/07/2011 1621   GFRNONAA 64* 12/18/2012 0400   GFRAA 74* 12/18/2012 0400   Foley:  12/15/12  Intake/Output Summary (Last 24 hours) at 12/18/12 0806 Last data filed at 12/18/12 0754  Gross per 24 hour  Intake 1466.66 ml  Output   4285 ml  Net -2818.34 ml   A:   1) Normal kidney function, no metabolic acidosis as ABG reported. P:   - Will follow BMP. - Replace electrolytes as needed. - Lasix as ordered. - Electrolyte replacement as needed.  GASTROINTESTINAL  Lab 12/15/12 0417 12/15/12 0246  AST 11 12  ALT 5 6  ALKPHOS 93 96  BILITOT 0.3 0.2*  PROT 6.2 6.5  ALBUMIN 3.0* 3.1*   A:   1) No issues P:   - GI prophylaxis with protonix. - Continue TF.  HEMATOLOGIC  Lab 12/18/12 0400 12/17/12 0500 12/16/12 0623 12/15/12 0246  HGB 10.2* 9.3* 10.2* 10.8*  HCT 32.3* 30.2* 32.7* 37.2*  PLT 276 241 244 271  INR -- -- -- 1.05  APTT -- -- -- 34   CBC    Component Value Date/Time   WBC 6.4 12/18/2012 0400   WBC 7.7 10/24/2012 0935   RBC 3.77* 12/18/2012 0400   RBC 4.15* 10/24/2012 0935   HGB 10.2* 12/18/2012 0400   HGB 11.7* 10/24/2012 0935   HCT 32.3* 12/18/2012 0400   HCT 38.3* 10/24/2012 0935   PLT 276 12/18/2012 0400   PLT 383 10/24/2012 0935   MCV 85.7 12/18/2012 0400   MCV  92.3 10/24/2012 0935   MCH 27.1 12/18/2012 0400   MCH 28.2 10/24/2012 0935   MCHC 31.6 12/18/2012 0400   MCHC 30.5* 10/24/2012 0935   RDW 14.2 12/18/2012 0400   RDW 15.9* 10/24/2012 0935   LYMPHSABS 2.1 12/15/2012 0246   LYMPHSABS 1.2 10/24/2012 0935   MONOABS 0.7 12/15/2012 0246   MONOABS 1.0* 10/24/2012 0935   EOSABS 0.2 12/15/2012 0246   EOSABS 0.0 10/24/2012 0935   BASOSABS 0.0 12/15/2012 0246   BASOSABS 0.0 10/24/2012 0935   A:   1) Anemia likely due to chronic disease P:  - we will follow CBC  INFECTIOUS  Lab 12/18/12 0400 12/17/12 0500 12/16/12  4098 12/15/12 0417 12/15/12 0246  WBC 6.4 5.9 5.9 -- 6.4  PROCALCITON -- -- -- <0.10 --   Cultures: Blood 12/28>>>NTD Urine 12/28>>>NTD Sputum 12/28>>>NTD Antibiotics: Cefepime 12/28>>> Vancomycin 12/28>>>  A:   1) Health care associated pneumonia P:   - D/C cefepime and vanc. - Start rocephin IV for 5 more days. - May require drainage of fluid but will monitor for now since clinically patient is showing no signs of being septic/toxic.  ENDOCRINE  Lab 12/18/12 0424 12/18/12 12/17/12 1941 12/17/12 1603 12/17/12 1238  GLUCAP 150* 168* 162* 143* 154*   A:   1) DM P:   - Novolog sliding scale. (ICU protocol)  NEUROLOGIC  A:   1) Altered mental status likely secondary to hypoxia and metabolic acidosis.  Acute metabolic encephalopathy. P:   - CT scan of the head with no acute stroke. - Change sedation to versed/fentanyl PRN. - Added precedex to maintain as awake as possible.  No family again bedside today, they have been very evasive with regards to responding to calls...etc.  Will ask social work to arrange for a meeting on Thursday as I anticipate patient not to do well extubated, either trach or comfort upon extubation.  The patient is critically ill with multiple organ systems failure and requires high complexity decision making for assessment and support, frequent evaluation and titration of therapies, application  of advanced monitoring technologies and extensive interpretation of multiple databases.   Critical Care Time devoted to patient care services described in this note is: 35 min.  Alyson Reedy, M.D. [facility] Pulmonary/Critical Care Medicine. Pager: [phone]. After hours pager: [phone].

## 2012-12-19 ENCOUNTER — Inpatient Hospital Stay (HOSPITAL_COMMUNITY): Payer: Medicare Other

## 2012-12-19 LAB — BLOOD GAS, ARTERIAL
Bicarbonate: 27.8 mEq/L — ABNORMAL HIGH (ref 20.0–24.0)
MECHVT: 520 mL
PEEP: 5 cmH2O
Patient temperature: 99.3
pCO2 arterial: 39.3 mmHg (ref 35.0–45.0)
pH, Arterial: 7.465 — ABNORMAL HIGH (ref 7.350–7.450)

## 2012-12-19 LAB — CBC
MCH: 26.6 pg (ref 26.0–34.0)
MCHC: 30.6 g/dL (ref 30.0–36.0)
MCV: 86.9 fL (ref 78.0–100.0)
Platelets: 317 10*3/uL (ref 150–400)
RBC: 4.06 MIL/uL — ABNORMAL LOW (ref 4.22–5.81)

## 2012-12-19 LAB — BASIC METABOLIC PANEL
CO2: 28 mEq/L (ref 19–32)
Calcium: 9.2 mg/dL (ref 8.4–10.5)
Creatinine, Ser: 1 mg/dL (ref 0.50–1.35)
GFR calc non Af Amer: 68 mL/min — ABNORMAL LOW (ref >90)

## 2012-12-19 LAB — MAGNESIUM: Magnesium: 1.9 mg/dL (ref 1.5–2.5)

## 2012-12-19 MED ORDER — DEXTROSE-NACL 5-0.45 % IV SOLN
INTRAVENOUS | Status: DC
  Administered 2012-12-19 – 2012-12-24 (×6): via INTRAVENOUS

## 2012-12-19 MED ORDER — DEXMEDETOMIDINE HCL IN NACL 200 MCG/50ML IV SOLN
0.2000 ug/kg/h | INTRAVENOUS | Status: AC
  Administered 2012-12-19: 0.4 ug/kg/h via INTRAVENOUS
  Filled 2012-12-19: qty 50

## 2012-12-19 MED ORDER — PANTOPRAZOLE SODIUM 40 MG IV SOLR
40.0000 mg | INTRAVENOUS | Status: DC
  Administered 2012-12-19 – 2012-12-26 (×8): 40 mg via INTRAVENOUS
  Filled 2012-12-19 (×9): qty 40

## 2012-12-19 MED ORDER — MIDAZOLAM HCL 2 MG/2ML IJ SOLN: 2.0000 mg | INTRAMUSCULAR | Status: DC | PRN

## 2012-12-19 MED ORDER — FENTANYL CITRATE 0.05 MG/ML IJ SOLN: 25.0000 ug | INTRAMUSCULAR | Status: DC | PRN

## 2012-12-19 NOTE — Significant Event (Signed)
Pt calm, appropriate.  Notes discomfort from ETT, OG tubes.  Has done very well with pressure support weaning for several hours.  Will check cuff leak.  If okay will proceed with extubation.  Will wean off precedex as tolerated after extubation.  Coralyn Helling, MD [facility] Pulmonary/Critical Care 12/19/2012, 2:42 PM Pager:  [phone] After 3pm call: [phone]

## 2012-12-19 NOTE — Progress Notes (Signed)
Inpatient Diabetes Program Recommendations  AACE/ADA: New Consensus Statement on Inpatient Glycemic Control (2013)  Target Ranges:  Prepandial:   less than 140 mg/dL      Peak postprandial:   less than 180 mg/dL (1-2 hours)      Critically ill patients:  140 - 180 mg/dL  Results for JIHAN, RUDY (MRN [id number]) as of 12/19/2012 12:21  Ref. Range 12/18/2012 20:05 12/19/2012 00:50 12/19/2012 03:45 12/19/2012 07:54 12/19/2012 11:32  Glucose-Capillary Latest Range: 70-99 mg/dL 409 (H) 811 (H) 914 (H) 210 (H) 184 (H)   Inpatient Diabetes Program Recommendations Insulin - Meal Coverage: add Novolog Tube Feed coverage 3 units Q 4 Thank you  Piedad Climes [facility] Inpatient Diabetes Coordinator [phone]

## 2012-12-19 NOTE — Progress Notes (Signed)
SLP Cancellation Note  Patient Details Name: Johnathan Bowman MRN: [id number] DOB: 12-14-1929   Cancelled treatment:       Reason Eval/Treat Not Completed: Patient not medically ready. Received orders at 3pm, pt just getting extubated. Will f/u in am for swallow eval.    Jakub Debold, Riley Nearing 12/19/2012, 3:00 PM

## 2012-12-19 NOTE — Progress Notes (Signed)
Name: Johnathan Bowman MRN: [id number] DOB: 1929/07/17    LOS: 4  PULMONARY / CRITICAL CARE MEDICINE  HPI:   79 years old male with PMH relevant for HTN, DM, dyslipidemia, GERD, CAD, DVT. Diagnosed with metastatic (stage IV) non small cell lung cancer in November 2011 (LUL). Treated since then with chemotherapy. Last cycle given in October 2013. He has history of a left sided malignant pleural effusion (cytology from 01/05/12) and had a Pleurex catheter placed in the past. History obtained from records. Patient unable to provide history due to intubation. Wife was present at bedside but very poor historian. Apparently he has been experiencing progressive SOB, productive cough and chills for one week. On the day of admission he was found by his wife with AMS, some questionable chest pain and worsening respiratory distress. At arrival to the ED he was hypoxic and was started on BIPAP. His mental status deteriorated and had to be intubated. At the time of my exam the patient is intubated on mechanical ventilation, sedated with propofol, hemodynamically stable.  Vital Signs: Temp:  [98.6 F (37 C)-99.9 F (37.7 C)] 99.3 F (37.4 C) (01/01 0500) Pulse Rate:  [43-87] 65  (01/01 0500) Resp:  [15-24] 16  (01/01 0500) BP: (107-149)/(48-76) 114/63 mmHg (01/01 0500) SpO2:  [96 %-100 %] 99 % (01/01 0500) FiO2 (%):  [30 %-40 %] 30 % (01/01 0500)  Physical Examination: General:  Intubated, mechanically ventilated, no acute distress Neuro:  Sedated but arousable, synchronous, nonfocal. HEENT:  PERRL, pink conjunctivae, moist membranes Neck:  Supple, no JVD   Cardiovascular:  RRR, no M/R/G Lungs:  Bilateral diffuse crackles, no wheezing Abdomen:  Soft, nontender, nondistended, bowel sounds present Musculoskeletal:  Moves all extremities, 1+ pedal edema Skin:  No rash  ASSESSMENT AND PLAN  PULMONARY  Lab 12/19/12 0500 12/18/12 0420 12/17/12 0355 12/16/12 0350 12/15/12 0545  PHART 7.465* 7.443 7.425  7.461* 7.381  PCO2ART 39.3 40.0 38.2 33.5* 41.3  PO2ART 83.4 71.8* 77.0* 80.3 94.2  HCO3 27.8* 27.0* 24.6* 23.6 24.3*  O2SAT 95.8 92.6 96.7 97.2 97.6   Ventilator Settings: Vent Mode:  [-] PRVC FiO2 (%):  [30 %-40 %] 30 % Set Rate:  [16 bmp] 16 bmp Vt Set:  [520 mL] 520 mL PEEP:  [5 cmH20] 5 cmH20 Pressure Support:  [10 cmH20] 10 cmH20 Plateau Pressure:  [15 cmH20-19 cmH20] 19 cmH20 CXR:  Lt ASD and effusion ETT:  12/28  A:   1) Health care associated pneumonia 2) Acute on chronic respiratory failure 3) Loculated malignant left pleural effusion P:   - SBT today, will need to address code status with family prior to extubation however, all attempts to reach family thus far has been unsuccessful.  Consulted social work Passenger transport manager to arrange a family meeting on 1/2. - PS trials. - VAP prevention order set. - Daily awakening and SBT. - CT noted but no active interventions for now. - May need Pleurex catheter placement but will need more discussion with family regarding plan of care, they have been very evasive, as above. - Albuterol/ipratropium PRN.  CARDIOVASCULAR  Lab 12/15/12 1735 12/15/12 1125 12/15/12 0417 12/15/12 0247 12/15/12 0246  TROPONINI <0.30 <0.30 <0.30 -- --  LATICACIDVEN -- -- -- -- 2.5*  PROBNP -- -- -- 95.3 --   ECG:  Sinus tachycardia Lines: Left port-A-cath. Peripheral IV's  A:  Hemodynamically stable after transient hypotension due to sedation. P:  - KVO IVF.  RENAL  BMET Lab Results  Component Value Date  CREATININE 1.06 12/18/2012   BUN 31* 12/18/2012   NA 136 12/18/2012   K 3.9 12/18/2012   CL 99 12/18/2012   CO2 28 12/18/2012    Foley:  12/15/12  Intake/Output Summary (Last 24 hours) at 12/19/12 0603 Last data filed at 12/19/12 0500  Gross per 24 hour  Intake 1485.42 ml  Output   2625 ml  Net -1139.58 ml   A:   1) Normal kidney function. P:   - Will follow BMP. - Electrolyte replacement as needed.  GASTROINTESTINAL  Lab  12/15/12 0417 12/15/12 0246  AST 11 12  ALT 5 6  ALKPHOS 93 96  BILITOT 0.3 0.2*  PROT 6.2 6.5  ALBUMIN 3.0* 3.1*   A:   1) No issues P:   - GI prophylaxis with protonix. - Continue TF.  HEMATOLOGIC  Lab 12/19/12 0500 12/18/12 0400 12/17/12 0500 12/16/12 0623 12/15/12 0246  HGB 10.8* 10.2* 9.3* 10.2* 10.8*  HCT 35.3* 32.3* 30.2* 32.7* 37.2*  PLT 317 276 241 244 271  INR -- -- -- -- 1.05  APTT -- -- -- -- 34    A:   1) Anemia likely due to chronic disease P:  - we will follow CBC  INFECTIOUS  Lab 12/19/12 0500 12/18/12 0400 12/17/12 0500 12/16/12 0623 12/15/12 0417 12/15/12 0246  WBC 6.7 6.4 5.9 5.9 -- 6.4  PROCALCITON -- -- -- -- <0.10 --   Cultures: Blood 12/28>>>NTD Urine 12/28>>>NTD Sputum 12/28>>>NTD Antibiotics: Cefepime 12/28>>> Vancomycin 12/28>>> Rocephin 12/31>>>  A:   1) Health care associated pneumonia P:   - D/C cefepime and vanc. - Start rocephin 12/31 and continue for 5 more days. - May require drainage of fluid but will monitor for now since clinically patient is showing no signs of being septic/toxic.  ENDOCRINE  Lab 12/19/12 0345 12/19/12 0050 12/18/12 2005 12/18/12 1606 12/18/12 1204  GLUCAP 208* 191* 190* 150* 155*   A:   1) DM P:   - Novolog sliding scale. (ICU protocol)  NEUROLOGIC  A:   1) Altered mental status likely secondary to hypoxia and metabolic acidosis.  Acute metabolic encephalopathy. P:   - CT scan of the head with no acute stroke. - Change sedation to versed/fentanyl PRN. - Added precedex to maintain as awake as possible.  Pressure support wean as tolerated>>not ready for extubation.  Hopefully family will be available 1/02 to discuss goals of care.  CC time 35 minutes.  Coralyn Helling, MD [facility] Pulmonary/Critical Care 12/19/2012, 6:07 AM Pager:  [phone] After 3pm call: [phone]

## 2012-12-20 ENCOUNTER — Inpatient Hospital Stay (HOSPITAL_COMMUNITY): Payer: Medicare Other

## 2012-12-20 DIAGNOSIS — R29810 Facial weakness: Secondary | ICD-10-CM

## 2012-12-20 DIAGNOSIS — R4701 Aphasia: Secondary | ICD-10-CM | POA: Insufficient documentation

## 2012-12-20 LAB — GLUCOSE, CAPILLARY
Glucose-Capillary: 148 mg/dL — ABNORMAL HIGH (ref 70–99)
Glucose-Capillary: 161 mg/dL — ABNORMAL HIGH (ref 70–99)
Glucose-Capillary: 148 mg/dL — ABNORMAL HIGH (ref 70–99)
Glucose-Capillary: 121 mg/dL — ABNORMAL HIGH (ref 70–99)
Glucose-Capillary: 129 mg/dL — ABNORMAL HIGH (ref 70–99)

## 2012-12-20 LAB — BASIC METABOLIC PANEL
BUN: 24 mg/dL — ABNORMAL HIGH (ref 6–23)
Calcium: 9.3 mg/dL (ref 8.4–10.5)
Creatinine, Ser: 0.85 mg/dL (ref 0.50–1.35)
GFR calc Af Amer: >90 mL/min (ref >90)
GFR calc non Af Amer: 80 mL/min — ABNORMAL LOW (ref >90)

## 2012-12-20 LAB — BLOOD GAS, ARTERIAL
Drawn by: 324701
FIO2: 1 %
O2 Saturation: 99.9 %
PEEP: 5 cmH2O
Patient temperature: 98.6
Patient temperature: 98.6
RATE: 16 resp/min
TCO2: 30.3 mmol/L (ref 0–100)
pCO2 arterial: 45.4 mmHg — ABNORMAL HIGH (ref 35.0–45.0)
pH, Arterial: 7.419 (ref 7.350–7.450)
pH, Arterial: 7.458 — ABNORMAL HIGH (ref 7.350–7.450)

## 2012-12-20 LAB — CULTURE, RESPIRATORY W GRAM STAIN

## 2012-12-20 LAB — CBC
HCT: 37.6 % — ABNORMAL LOW (ref 39.0–52.0)
MCHC: 30.3 g/dL (ref 30.0–36.0)
Platelets: 333 10*3/uL (ref 150–400)
RDW: 14.1 % (ref 11.5–15.5)
WBC: 7.6 10*3/uL (ref 4.0–10.5)

## 2012-12-20 MED ORDER — ETOMIDATE 2 MG/ML IV SOLN
20.0000 mg | Freq: Once | INTRAVENOUS | Status: AC
  Administered 2012-12-20: 20 mg via INTRAVENOUS

## 2012-12-20 MED ORDER — MIDAZOLAM BOLUS VIA INFUSION
1.0000 mg | INTRAVENOUS | Status: DC | PRN
  Filled 2012-12-20: qty 2

## 2012-12-20 MED ORDER — CHLORHEXIDINE GLUCONATE 0.12 % MT SOLN
15.0000 mL | Freq: Two times a day (BID) | OROMUCOSAL | Status: DC
  Administered 2012-12-20 – 2013-01-02 (×26): 15 mL via OROMUCOSAL
  Filled 2012-12-20 (×28): qty 15

## 2012-12-20 MED ORDER — ROCURONIUM BROMIDE 50 MG/5ML IV SOLN
50.0000 mg | Freq: Once | INTRAVENOUS | Status: AC
  Administered 2012-12-20: 50 mg via INTRAVENOUS

## 2012-12-20 MED ORDER — BIOTENE DRY MOUTH MT LIQD
15.0000 mL | Freq: Four times a day (QID) | OROMUCOSAL | Status: DC
  Administered 2012-12-21 – 2013-01-02 (×45): 15 mL via OROMUCOSAL

## 2012-12-20 MED ORDER — FENTANYL CITRATE 0.05 MG/ML IJ SOLN
INTRAMUSCULAR | Status: AC
  Administered 2012-12-20: 100 ug
  Filled 2012-12-20: qty 2

## 2012-12-20 MED ORDER — MIDAZOLAM HCL 2 MG/2ML IJ SOLN
2.0000 mg | Freq: Once | INTRAMUSCULAR | Status: AC
  Administered 2012-12-20: 2 mg via INTRAVENOUS

## 2012-12-20 MED ORDER — SODIUM CHLORIDE 0.9 % IV SOLN
25.0000 ug/h | INTRAVENOUS | Status: DC
  Administered 2012-12-20: 50 ug/h via INTRAVENOUS
  Administered 2012-12-21: 150 ug/h via INTRAVENOUS
  Administered 2012-12-22: 50 ug/h via INTRAVENOUS
  Administered 2012-12-23: 100 ug/h via INTRAVENOUS
  Administered 2012-12-25: 25 ug/h via INTRAVENOUS
  Administered 2012-12-25: 50 ug/h via INTRAVENOUS
  Filled 2012-12-20 (×6): qty 50

## 2012-12-20 MED ORDER — MIDAZOLAM HCL 2 MG/2ML IJ SOLN
INTRAMUSCULAR | Status: AC
  Administered 2012-12-20: 2 mg
  Filled 2012-12-20: qty 4

## 2012-12-20 MED ORDER — SODIUM CHLORIDE 0.9 % IV SOLN
1.0000 mg/h | INTRAVENOUS | Status: DC
  Administered 2012-12-20: 2 mg/h via INTRAVENOUS
  Administered 2012-12-20: 1 mg/h via INTRAVENOUS
  Administered 2012-12-21: 2 mg/h via INTRAVENOUS
  Administered 2012-12-23 (×2): 3 mg/h via INTRAVENOUS
  Administered 2012-12-24 – 2012-12-25 (×2): 2 mg/h via INTRAVENOUS
  Administered 2012-12-25 (×2): 1 mg/h via INTRAVENOUS
  Filled 2012-12-20 (×9): qty 10

## 2012-12-20 MED ORDER — FENTANYL BOLUS VIA INFUSION
25.0000 ug | Freq: Four times a day (QID) | INTRAVENOUS | Status: DC | PRN
  Filled 2012-12-20: qty 100

## 2012-12-20 NOTE — Progress Notes (Signed)
Portable EEG completed

## 2012-12-20 NOTE — Evaluation (Signed)
Clinical/Bedside Swallow Evaluation Patient Details  Name: Johnathan Bowman MRN: [id number] Date of Birth: 1929/03/23  Today's Date: 12/20/2012 Time: 4098-1191 SLP Time Calculation (min): 23 min  Past Medical History:  Past Medical History  Diagnosis Date  . Hypertension   . Diabetes mellitus   . Dyslipidemia   . Mini stroke 2005  . Degenerative disc disease   . GERD (gastroesophageal reflux disease)   . Osteopenia 2005  . Coronary artery disease 2005    treated medically  . BPH (benign prostatic hyperplasia)   . DVT (deep venous thrombosis)   . History of chemotherapy   . Diverticulosis     per scan 05/28/12  . Calculus of kidney     per scan 05/28/12  . Hx of radiation therapy 06/14/12 -07/05/12    left chest  . lung ca 11/12/2010    LUL   Past Surgical History:  Past Surgical History  Procedure Date  . No past surgeries   . Pleurex cath 02/08/11    left-sided pleural effusion   HPI:  79 years old male with PMH relevant for HTN, DM, dyslipidemia, GERD, CAD, DVT. Diagnosed with metastatic (stage IV) non small cell lung cancer in November 2011 (LUL). Treated since then with chemotherapy. Last cycle given in October 2013. He has history of a left sided malignant pleural effusion (cytology from 01/05/12) and had a Pleurex catheter placed in the past. Per chart review, patient admitted with questionable chest pain and worsening respiratory distress, started on BiPap however deteriorated requiring intubation 12/28-1/1. Patient currently in room, lying in bed, non-verbal, writing only. When questioned regarding non-verbal nature, wife stated that difficulty speaking was the first sign of distress at home in which patient began to point to throat, unable to speak, and eventually writing "911" for her to communicate.    Assessment / Plan / Recommendation Clinical Impression  Bedside swallow evaluation complete. Patient non-verbal, writing however not clearly intelligibly written language  with decreased processing time. Patient with appearance of CN VII and possibly V involvement with flaccid right side of the face and oral cavity resulting in inability to orally transit a clinician provided po bolus as well inability to verbalize.  Additionally, patient with poor secretion management as evidenced by wet exhalation and intermittent coughing indicative of decreased airway protection with saliva despite diagnostic treatment which included SLP provided max verbal, tactile, and visual cueing for management. Patient not appropriate for pos at this time. Discussed in depth with RN and MD (CCM) who were present. Question acute CVA. Neurology being consulted. SLP will contniue to f/u at bedside for results of consult and potential improvement.     Aspiration Risk  Severe    Diet Recommendation NPO   Medication Administration: Via alternative means    Other  Recommendations Oral Care Recommendations: Oral care QID   Follow Up Recommendations   (TBD)    Frequency and Duration min 3x week  2 weeks   Pertinent Vitals/Pain n/a    SLP Swallow Goals Goal #3: Patient will orally manage secretions at bedside with min clinician cueing.  Swallow Study Goal #3 - Progress: Not met   Swallow Study    General HPI: 79 years old male with PMH relevant for HTN, DM, dyslipidemia, GERD, CAD, DVT. Diagnosed with metastatic (stage IV) non small cell lung cancer in November 2011 (LUL). Treated since then with chemotherapy. Last cycle given in October 2013. He has history of a left sided malignant pleural effusion (cytology from 01/05/12) and  had a Pleurex catheter placed in the past. Per chart review, patient admitted with questionable chest pain and worsening respiratory distress, started on BiPap however deteriorated requiring intubation 12/28-1/1. Patient currently in room, lying in bed, non-verbal, writing only. When questioned regarding non-verbal nature, wife stated that difficulty speaking was the  first sign of distress at home in which patient began to point to throat, unable to speak, and eventually writing "911" for her to communicate.  Type of Study: Bedside swallow evaluation Previous Swallow Assessment: none Diet Prior to this Study: NPO Temperature Spikes Noted: Yes Respiratory Status: Supplemental O2 delivered via (comment) (nasal cannula) History of Recent Intubation: Yes Length of Intubations (days): 4 days Date extubated: 12/19/12 Behavior/Cognition: Alert;Cooperative;Pleasant mood (non-verbal) Oral Cavity - Dentition: Edentulous Self-Feeding Abilities: Able to feed self Patient Positioning: Upright in bed Baseline Vocal Quality: Wet Volitional Cough:  (unable, spontanous cough weak, wet) Volitional Swallow: Able to elicit    Oral/Motor/Sensory Function Overall Oral Motor/Sensory Function: Impaired Labial ROM: Reduced right Labial Symmetry: Abnormal symmetry right Labial Strength: Reduced Labial Sensation: Within Functional Limits Lingual ROM: Reduced right Lingual Symmetry: Abnormal symmetry right Lingual Strength: Reduced Lingual Sensation: Within Functional Limits Facial ROM: Reduced right Facial Symmetry: Right droop Facial Strength: Reduced Facial Sensation: Within Functional Limits Velum:  (unable to observe due to poor lingual movement) Mandible: Impaired (decreased elevation)   Ice Chips Ice chips: Impaired Presentation: Spoon Oral Phase Impairments: Reduced labial seal;Reduced lingual movement/coordination;Impaired anterior to posterior transit (unable to move tongue in order to transit ice chip orally) Oral Phase Functional Implications: Oral residue (suctioned by SLP) Pharyngeal Phase Impairments: Cough - Delayed (poor management of saliva)   Thin Liquid Thin Liquid: Not tested    Nectar Thick Nectar Thick Liquid: Not tested   Honey Thick Honey Thick Liquid: Not tested   Puree Puree: Not tested   Solid   GO   Syniyah Bourne MA,  CCC-SLP [phone]  Solid: Not tested       Aziah Brostrom Meryl 12/20/2012,10:29 AM

## 2012-12-20 NOTE — Progress Notes (Signed)
Name: Johnathan Bowman MRN: [id number] DOB: 1929-08-15    LOS: 5  PULMONARY / CRITICAL CARE MEDICINE  HPI:   79 years old male with PMH relevant for HTN, DM, dyslipidemia, GERD, CAD, DVT. Diagnosed with metastatic (stage IV) non small cell lung cancer in November 2011 ([facility]). Treated since then with chemotherapy. Last cycle given in October 2013. He has history of a left sided malignant pleural effusion (cytology from 01/05/12) and had a Pleurex catheter placed in the past. History obtained from records. Patient unable to provide history due to intubation. Wife was present at bedside but very poor historian. Apparently he has been experiencing progressive SOB, productive cough and chills for one week. On the day of admission he was found by his wife with AMS, some questionable chest pain and worsening respiratory distress. At arrival to the ED he was hypoxic and was started on BIPAP. His mental status deteriorated and had to be intubated. At the time of my exam the patient is intubated on mechanical ventilation, sedated with propofol, hemodynamically stable.  Vital Signs: Temp:  [98.6 F (37 C)-99.7 F (37.6 C)] 99.3 F (37.4 C) (01/02 0700) Pulse Rate:  [67-122] 100  (01/02 0700) Resp:  [15-26] 23  (01/02 0700) BP: (111-170)/(55-109) 143/55 mmHg (01/02 0700) SpO2:  [91 %-100 %] 96 % (01/02 0700) FiO2 (%):  [30 %-100 %] 100 % (01/01 1445)  Physical Examination: General:  Extubated, alert and interactive. Neuro: Arousable, non-focal. HEENT:  PERRL, pink conjunctivae, moist membranes Neck:  Supple, no JVD   Cardiovascular:  RRR, no M/R/G Lungs:  Bilateral diffuse crackles, no wheezing Abdomen:  Soft, nontender, nondistended, bowel sounds present Musculoskeletal:  Moves all extremities, no pedal edema Skin:  No rash  ASSESSMENT AND PLAN  PULMONARY  Lab 12/20/12 0059 12/19/12 0500 12/18/12 0420 12/17/12 0355 12/16/12 0350  PHART 7.419 7.465* 7.443 7.425 7.461*  PCO2ART 45.4* 39.3 40.0  38.2 33.5*  PO2ART 66.1* 83.4 71.8* 77.0* 80.3  HCO3 28.9* 27.8* 27.0* 24.6* 23.6  O2SAT 92.4 95.8 92.6 96.7 97.2   Ventilator Settings: Vent Mode:  [-] PSV FiO2 (%):  [30 %-100 %] 100 % Set Rate:  [14 bmp] 14 bmp PEEP:  [5 cmH20] 5 cmH20 Pressure Support:  [10 cmH20] 10 cmH20 CXR:  Lt ASD and effusion ETT:  12/28>>>1/1  A:   1) Health care associated pneumonia 2) Acute on chronic respiratory failure 3) Loculated malignant left pleural effusion P:   - Extubated and doing well. - IS per rt protocol. - KVO IVF. - Swallow evaluation. - May need Pleurex catheter placement but will need more discussion with family regarding plan of care, they have been very evasive, but evidently coming in today. - Albuterol/ipratropium PRN.  CARDIOVASCULAR  Lab 12/15/12 1735 12/15/12 1125 12/15/12 0417 12/15/12 0247 12/15/12 0246  TROPONINI <0.30 <0.30 <0.30 -- --  LATICACIDVEN -- -- -- -- 2.5*  PROBNP -- -- -- 95.3 --   ECG:  Sinus tachycardia Lines: Left port-A-cath. Peripheral IV's  A:  Hemodynamically stable after transient hypotension due to sedation. P:  - KVO IVF.  RENAL  BMET Lab Results  Component Value Date   CREATININE 1.00 12/19/2012   BUN 40* 12/19/2012   NA 139 12/19/2012   K 4.1 12/19/2012   CL 99 12/19/2012   CO2 28 12/19/2012   Foley:  12/15/12  Intake/Output Summary (Last 24 hours) at 12/20/12 0834 Last data filed at 12/20/12 0600  Gross per 24 hour  Intake 579.91 ml  Output  1180 ml  Net -600.09 ml   A:   1) Normal kidney function. P:   - Will follow BMP. - Electrolyte replacement as needed.  GASTROINTESTINAL  Lab 12/15/12 0417 12/15/12 0246  AST 11 12  ALT 5 6  ALKPHOS 93 96  BILITOT 0.3 0.2*  PROT 6.2 6.5  ALBUMIN 3.0* 3.1*   A:   1) No issues P:   - GI prophylaxis with protonix. - Continue TF.  HEMATOLOGIC  Lab 12/19/12 0500 12/18/12 0400 12/17/12 0500 12/16/12 0623 12/15/12 0246  HGB 10.8* 10.2* 9.3* 10.2* 10.8*  HCT 35.3* 32.3* 30.2*  32.7* 37.2*  PLT 317 276 241 244 271  INR -- -- -- -- 1.05  APTT -- -- -- -- 34   A:   1) Anemia likely due to chronic disease P:  - we will follow CBC  INFECTIOUS  Lab 12/19/12 0500 12/18/12 0400 12/17/12 0500 12/16/12 0623 12/15/12 0417 12/15/12 0246  WBC 6.7 6.4 5.9 5.9 -- 6.4  PROCALCITON -- -- -- -- <0.10 --   Cultures: Blood 12/28>>>NTD Urine 12/28>>>NTD Sputum 12/28>>>NTD Antibiotics: Cefepime 12/28>>> Vancomycin 12/28>>> Rocephin 12/31>>>  A:   1) Health care associated pneumonia P:   - D/C cefepime and vanc. - Start rocephin 12/31 and continue for 5 more days. - May require drainage of fluid but will monitor for now since clinically patient is showing no signs of being septic/toxic.  ENDOCRINE  Lab 12/20/12 0744 12/20/12 0435 12/20/12 0031 12/19/12 1132 12/19/12 0754  GLUCAP 121* 148* 136* 184* 210*   A:   1) DM P:   - Novolog sliding scale. (ICU protocol)  NEUROLOGIC  A:   1) Altered mental status likely secondary to hypoxia and metabolic acidosis.  Acute metabolic encephalopathy. P:   - CT scan of the head with no acute stroke. - Change sedation to versed/fentanyl PRN. - D/C precedex.  Extubated but pleural effusion remains an active issue.  Will speak with family today to see the extent of care that they wish prior to proceeding with surgical procedures as well as clarify the code status.  Alyson Reedy, M.D. [facility] Pulmonary/Critical Care Medicine. Pager: [phone]. After hours pager: [phone].

## 2012-12-20 NOTE — Progress Notes (Signed)
Notified E-link of the results of the chest X-ray. Orders pull back ETT 3 cm. Notified RT. Will continue to monitor.

## 2012-12-20 NOTE — Procedures (Signed)
Intubation Procedure Note Johnathan Bowman [id number] 27-Apr-1929  Procedure: Intubation Indications: Airway protection and maintenance  Procedure Details Consent: Risks of procedure as well as the alternatives and risks of each were explained to the (patient/caregiver).  Consent for procedure obtained. Time Out: Verified patient identification, verified procedure, site/side was marked, verified correct patient position, special equipment/implants available, medications/allergies/relevent history reviewed, required imaging and test results available.  Performed  Maximum sterile technique was used including antiseptics, gloves, hand hygiene and sheet.  MAC    Evaluation Hemodynamic Status: BP stable throughout; O2 sats: stable throughout Patient's Current Condition: stable Complications: No apparent complications Patient did tolerate procedure well. Chest X-ray ordered to verify placement.  CXR: pending.   Johnathan Bowman 12/20/2012  Alyson Reedy, M.D. [facility] Pulmonary/Critical Care Medicine. Pager: [phone]. After hours pager: [phone].

## 2012-12-20 NOTE — Consult Note (Signed)
Referring Physician: Molli Knock    Chief Complaint: Inability to speak  HPI: Johnathan Bowman is an 79 y.o. male who has a history of metastatic non small cell lung cancer undergoing chemotherapy.  On the day of admission was at baseline.  His wife went to church.  When she returned he was unable to speak and was sitting down 911.  Had tried to call but was unable to speak to the operator.  Was brought to the hospital and at that time the complaints were of SOB and difficulty breathing.  Patient was intubated.  On extubation today found to have difficulty swallowing and right facial droop.  Consult v=called for further evaluation.   Last imaging was performed on 07/10/12 was an MRI of the brain and showed areas of enhancement.  There was some question as to whether these were infarctions or metastatic disease.  Head CT on admission showed no acute changes  LSN: [id number] 12/15/2012 tPA Given: No: Outside time window  Past Medical History  Diagnosis Date  . Hypertension   . Diabetes mellitus   . Dyslipidemia   . Mini stroke 2005  . Degenerative disc disease   . GERD (gastroesophageal reflux disease)   . Osteopenia 2005  . Coronary artery disease 2005    treated medically  . BPH (benign prostatic hyperplasia)   . DVT (deep venous thrombosis)   . History of chemotherapy   . Diverticulosis     per scan 05/28/12  . Calculus of kidney     per scan 05/28/12  . Hx of radiation therapy 06/14/12 -07/05/12    left chest  . lung ca 11/12/2010    [facility]    Past Surgical History  Procedure Date  . No past surgeries   . Pleurex cath 02/08/11    left-sided pleural effusion    Family history: Daughter alive and well  Social History:  reports that he quit smoking about 26 years ago. He has never used smokeless tobacco. He reports that he does not drink alcohol or use illicit drugs.  Allergies:  Allergies  Allergen Reactions  . Penicillins Other (See Comments)    Unknown childhood allergy     Medications:  I have reviewed the patient's current medications. Scheduled:   . antiseptic oral rinse  15 mL Mouth Rinse QID  . cefTRIAXone (ROCEPHIN)  IV  1 g Intravenous Q24H  . fentaNYL      . fentaNYL      . heparin  5,000 Units Subcutaneous Q8H  . insulin aspart  2-6 Units Subcutaneous Q4H  . midazolam      . multivitamin  5 mL Per Tube Daily  . pantoprazole (PROTONIX) IV  40 mg Intravenous Q24H    ROS: History obtained from the patient  General ROS:  fever Psychological ROS: negative for - behavioral disorder, hallucinations, memory difficulties, mood swings or suicidal ideation Ophthalmic ROS: negative for - blurry vision, double vision, eye pain or loss of vision ENT ROS: negative for - epistaxis, nasal discharge, oral lesions, sore throat, tinnitus or vertigo Allergy and Immunology ROS: negative for - hives or itchy/watery eyes Hematological and Lymphatic ROS: negative for - bleeding problems, bruising or swollen lymph nodes Endocrine ROS: negative for - galactorrhea, hair pattern changes, polydipsia/polyuria or temperature intolerance Respiratory ROS: shortness of breath  Cardiovascular ROS: negative for - chest pain, dyspnea on exertion, edema or irregular heartbeat Gastrointestinal ROS: negative for - abdominal pain, diarrhea, hematemesis, nausea/vomiting or stool incontinence Genito-Urinary ROS: negative for -  dysuria, hematuria, incontinence or urinary frequency/urgency Musculoskeletal ROS: negative for - joint swelling or muscular weakness Neurological ROS: as noted in HPI Dermatological ROS: negative for rash and skin lesion changes  Physical Examination: Blood pressure 151/83, pulse 87, temperature 99.3 F (37.4 C), temperature source Core (Comment), resp. rate 23, height 5\' 7"  (1.702 m), weight 80.6 kg (177 lb 11.1 oz), SpO2 97.00%.  Neurologic Examination: Mental Status: Alert.  Writing things down.  No speech.  Able to follow commands.   Cranial  Nerves: II: Discs flat bilaterally; Visual fields grossly normal, pupils equal, round, reactive to light and accommodation III,IV, VI: ptosis not present, extra-ocular motions intact bilaterally V,VII: right facial droop, facial light touch sensation normal bilaterally VIII: hearing normal bilaterally IX,X: gag reflex reduced XI: bilateral shoulder shrug XII:  tongue deviation to the right Motor: Right : Upper extremity   5/5    Left:     Upper extremity   5/5  Lower extremity   5/5     Lower extremity   5/5 Tone and bulk:normal tone throughout; no atrophy noted Sensory: Pinprick and light touch intact throughout, bilaterally Deep Tendon Reflexes: 2+ in the upper extremities, trace at the knees and absent at the ankles Plantars: Right: upgoing   Left: upgoing Cerebellar: normal finger-to-nose and normal heel-to-shin test Gait: Unable to test CV: pulses palpable throughout   Laboratory Studies:  Basic Metabolic Panel:  Lab 12/20/12 1610 12/19/12 0500 12/18/12 0400 12/17/12 0500 12/16/12 0623 12/15/12 0418  NA 143 139 136 134* 138 --  K 4.1 4.1 3.9 3.9 4.0 --  CL 103 99 99 100 105 --  CO2 26 28 28 24 22  --  GLUCOSE 141* 265* 148* 125* 82 --  BUN 24* 40* 31* 25* 20 --  CREATININE 0.85 1.00 1.06 0.91 0.98 --  CALCIUM 9.3 9.2 9.1 -- -- --  MG -- 1.9 1.7 1.8 1.6 1.8  PHOS -- 3.5 4.2 4.0 3.9 4.5    Liver Function Tests:  Lab 12/15/12 0417 12/15/12 0246  AST 11 12  ALT 5 6  ALKPHOS 93 96  BILITOT 0.3 0.2*  PROT 6.2 6.5  ALBUMIN 3.0* 3.1*    Lab 12/15/12 0418  LIPASE 33  AMYLASE 66   No results found for this basename: AMMONIA:3 in the last 168 hours  CBC:  Lab 12/20/12 0805 12/19/12 0500 12/18/12 0400 12/17/12 0500 12/16/12 0623 12/15/12 0246  WBC 7.6 6.7 6.4 5.9 5.9 --  NEUTROABS -- -- -- -- -- 3.4  HGB 11.4* 10.8* 10.2* 9.3* 10.2* --  HCT 37.6* 35.3* 32.3* 30.2* 32.7* --  MCV 86.8 86.9 85.7 87.3 86.7 --  PLT 333 317 276 241 244 --    Cardiac Enzymes:  Lab  12/15/12 1735 12/15/12 1125 12/15/12 0417  CKTOTAL -- -- --  CKMB -- -- --  CKMBINDEX -- -- --  TROPONINI <0.30 <0.30 <0.30    BNP: No components found with this basename: POCBNP:5  CBG:  Lab 12/20/12 0744 12/20/12 0435 12/20/12 0031 12/19/12 1132 12/19/12 0754  GLUCAP 121* 148* 136* 184* 210*    Microbiology: Results for orders placed during the hospital encounter of 12/15/12  CULTURE, BLOOD (ROUTINE X 2)     Status: Normal (Preliminary result)   Collection Time   12/15/12  2:00 AM      Component Value Range Status Comment   Specimen Description BLOOD HAND LEFT   Final    Special Requests BOTTLES DRAWN AEROBIC AND ANAEROBIC 10CC   Final  Culture  Setup Time 12/15/2012 12:29   Final    Culture     Final    Value:        BLOOD CULTURE RECEIVED NO GROWTH TO DATE CULTURE WILL BE HELD FOR 5 DAYS BEFORE ISSUING A FINAL NEGATIVE REPORT   Report Status PENDING   Incomplete   CULTURE, BLOOD (ROUTINE X 2)     Status: Normal (Preliminary result)   Collection Time   12/15/12  2:48 AM      Component Value Range Status Comment   Specimen Description BLOOD ARM RIGHT   Final    Special Requests BOTTLES DRAWN AEROBIC AND ANAEROBIC 5CC   Final    Culture  Setup Time 12/15/2012 12:29   Final    Culture     Final    Value:        BLOOD CULTURE RECEIVED NO GROWTH TO DATE CULTURE WILL BE HELD FOR 5 DAYS BEFORE ISSUING A FINAL NEGATIVE REPORT   Report Status PENDING   Incomplete   URINE CULTURE     Status: Normal   Collection Time   12/15/12  2:49 AM      Component Value Range Status Comment   Specimen Description URINE, RANDOM   Final    Special Requests NONE   Final    Culture  Setup Time 12/15/2012 12:36   Final    Colony Count NO GROWTH   Final    Culture NO GROWTH   Final    Report Status 12/16/2012 FINAL   Final   MRSA PCR SCREENING     Status: Normal   Collection Time   12/15/12  5:30 AM      Component Value Range Status Comment   MRSA by PCR NEGATIVE  NEGATIVE Final    CULTURE, RESPIRATORY     Status: Normal   Collection Time   12/17/12  8:30 PM      Component Value Range Status Comment   Specimen Description TRACHEAL ASPIRATE   Final    Special Requests NONE   Final    Gram Stain     Final    Value: FEW WBC PRESENT,BOTH PMN AND MONONUCLEAR     RARE SQUAMOUS EPITHELIAL CELLS PRESENT     NO ORGANISMS SEEN   Culture MODERATE CANDIDA ALBICANS   Final    Report Status 12/20/2012 FINAL   Final     Coagulation Studies: No results found for this basename: LABPROT:5,INR:5 in the last 72 hours  Urinalysis:  Lab 12/15/12 0244  COLORURINE YELLOW  LABSPEC 1.024  PHURINE 5.0  GLUCOSEU NEGATIVE  HGBUR NEGATIVE  BILIRUBINUR NEGATIVE  KETONESUR NEGATIVE  PROTEINUR NEGATIVE  UROBILINOGEN 0.2  NITRITE NEGATIVE  LEUKOCYTESUR SMALL*    Lipid Panel:    Component Value Date/Time   TRIG 116 12/18/2012 0400    HgbA1C:  Lab Results  Component Value Date   HGBA1C 10.8* 01/18/2012    Urine Drug Screen:   No results found for this basename: labopia, cocainscrnur, labbenz, amphetmu, thcu, labbarb    Alcohol Level: No results found for this basename: ETH:2 in the last 168 hours  Imaging: Dg Chest Port 1 View  12/20/2012  *RADIOLOGY REPORT*  Clinical Data: Pneumonia  PORTABLE CHEST - 1 VIEW  Comparison: Yesterday  Findings: Endotracheal and NG tubes removed.  Stable right internal jugular vein Port-A-Cath.  Left lower lobe consolidation is stable. Right base atelectasis is stable.  No pneumothorax.  IMPRESSION: Extubated.  Stable left lower lobe consolidation.  Stable right base atelectasis.   Original Report Authenticated By: Jolaine Click, M.D.    Dg Chest Port 1 View  12/19/2012  *RADIOLOGY REPORT*  Clinical Data: Endotracheal tube position.  Hypertension. Diabetes.  PORTABLE CHEST - 1 VIEW  Comparison: 1 day prior  Findings: Endotracheal tube 2.3 cm above carina.  Nasogastric extends beyond the  inferior aspect of the film.  Right-sided Port- A-Cath  terminating at the mid to low SVC.  Cardiomegaly accentuated by AP portable technique.  Similar small to moderate left-sided pleural effusion. No pneumothorax. Extremely low lung volumes.  Mild pulmonary venous congestion is difficult to exclude.  Left greater than right bibasilar airspace disease.  IMPRESSION:  1. No significant change since one day prior. 2.  Left-sided pleural effusion with right base atelectasis and left base pneumonia or atelectasis. 3.  Low lung volumes.  Cannot exclude mild pulmonary venous congestion.   Original Report Authenticated By: Jeronimo Greaves, M.D.     Assessment: 79 y.o. male with a history of stage IV metastatic lung cancer who presents with loss of speech, right facial droop and right tongue deviation.  Has had abnormal imaging in the past with question of metastasis versus infarct.  On recent imaging has evidence of chronic left sided infarcts.  Would like to rule out possibility of seizure as well.    Stroke Risk Factors - diabetes mellitus, hyperlipidemia, hypertension and CAD  Plan: 1. HgbA1c, fasting lipid panel 2. MRI  of the brain with and without contrast 3. PT consult, OT consult, Speech consult 4. EEG 5. Once MRI reviewed will determine whether full stroke work up required or oncology will need to be involved.  If unremarkable may require an autoimmune work up.   6. Telemetry monitoring 7. Frequent neuro checks   Thana Farr, MD [facility] [phone] 12/20/2012, 10:39 AM

## 2012-12-20 NOTE — Procedures (Signed)
Central Venous Catheter Insertion Procedure Note JSHON IBE [id number] 1929/01/09  Procedure: Insertion of Central Venous Catheter Indications: Assessment of intravascular volume and Drug and/or fluid administration  Procedure Details Consent: Unable to obtain consent because of emergent medical necessity. Time Out: Verified patient identification, verified procedure, site/side was marked, verified correct patient position, special equipment/implants available, medications/allergies/relevent history reviewed, required imaging and test results available.  Performed  Maximum sterile technique was used including antiseptics, cap, gloves, gown, hand hygiene, mask and sheet. Skin prep: Chlorhexidine; local anesthetic administered A antimicrobial bonded/coated triple lumen catheter was placed in the left internal jugular vein using the Seldinger technique.  Evaluation Blood flow good Complications: No apparent complications Patient did tolerate procedure well. Chest X-ray ordered to verify placement.  CXR: pending.  U/S used in placement.  YACOUB,WESAM 12/20/2012, 11:58 AM

## 2012-12-20 NOTE — Progress Notes (Signed)
Patient deteriorating from a respiratory stand point.  Unable to protect.  Spoke with patient, ok with short term intubation.  Will intubate and have neuro called for consultation.  MRI ordered.  CC time 40 min.  Alyson Reedy, M.D. [facility] Pulmonary/Critical Care Medicine. Pager: [phone]. After hours pager: [phone].

## 2012-12-20 NOTE — Procedures (Signed)
Arterial Catheter Insertion Procedure Note Johnathan Bowman [id number] [birth year]  Procedure: Insertion of Arterial Catheter  Indications: Blood pressure monitoring  Procedure Details Consent: Unable to obtain consent because of emergent medical necessity. Time Out: Verified patient identification, verified procedure, site/side was marked, verified correct patient position, special equipment/implants available, medications/allergies/relevent history reviewed, required imaging and test results available.  Performed  Maximum sterile technique was used including antiseptics, cap, gloves, gown, hand hygiene, mask and sheet. Skin prep: Chlorhexidine; local anesthetic administered 20 gauge catheter was inserted into right radial artery using the Seldinger technique.  Evaluation Blood flow good; BP tracing good. Complications: No apparent complications.  Koren Bound 12/20/2012

## 2012-12-21 ENCOUNTER — Inpatient Hospital Stay (HOSPITAL_COMMUNITY): Payer: Medicare Other

## 2012-12-21 DIAGNOSIS — R4701 Aphasia: Secondary | ICD-10-CM

## 2012-12-21 LAB — GLUCOSE, CAPILLARY
Glucose-Capillary: 116 mg/dL — ABNORMAL HIGH (ref 70–99)
Glucose-Capillary: 127 mg/dL — ABNORMAL HIGH (ref 70–99)
Glucose-Capillary: 131 mg/dL — ABNORMAL HIGH (ref 70–99)

## 2012-12-21 LAB — CULTURE, BLOOD (ROUTINE X 2)

## 2012-12-21 LAB — LIPID PANEL
HDL: 35 mg/dL — ABNORMAL LOW (ref >39)
LDL Cholesterol: 112 mg/dL — ABNORMAL HIGH (ref 0–99)
Total CHOL/HDL Ratio: 4.9 RATIO

## 2012-12-21 LAB — CBC
Hemoglobin: 10.2 g/dL — ABNORMAL LOW (ref 13.0–17.0)
MCHC: 30.4 g/dL (ref 30.0–36.0)
RBC: 3.87 MIL/uL — ABNORMAL LOW (ref 4.22–5.81)
WBC: 5.6 10*3/uL (ref 4.0–10.5)

## 2012-12-21 LAB — BASIC METABOLIC PANEL
CO2: 26 mEq/L (ref 19–32)
Chloride: 103 mEq/L (ref 96–112)
GFR calc non Af Amer: 58 mL/min — ABNORMAL LOW (ref >90)
Glucose, Bld: 131 mg/dL — ABNORMAL HIGH (ref 70–99)
Potassium: 3.6 mEq/L (ref 3.5–5.1)
Sodium: 141 mEq/L (ref 135–145)

## 2012-12-21 LAB — PHOSPHORUS: Phosphorus: 2.7 mg/dL (ref 2.3–4.6)

## 2012-12-21 LAB — HEMOGLOBIN A1C: Hgb A1c MFr Bld: 6.6 % — ABNORMAL HIGH (ref <5.7)

## 2012-12-21 MED ORDER — SODIUM CHLORIDE 0.9 % IV BOLUS (SEPSIS)
500.0000 mL | Freq: Once | INTRAVENOUS | Status: AC
  Administered 2012-12-21: 500 mL via INTRAVENOUS

## 2012-12-21 MED ORDER — JEVITY 1.2 CAL PO LIQD
1000.0000 mL | ORAL | Status: DC
  Administered 2012-12-21 – 2012-12-25 (×4): 1000 mL
  Filled 2012-12-21 (×8): qty 1000

## 2012-12-21 MED ORDER — PRO-STAT SUGAR FREE PO LIQD
30.0000 mL | Freq: Three times a day (TID) | ORAL | Status: DC
  Administered 2012-12-21 – 2012-12-26 (×15): 30 mL
  Filled 2012-12-21 (×20): qty 30

## 2012-12-21 MED ORDER — GADOBENATE DIMEGLUMINE 529 MG/ML IV SOLN
17.0000 mL | Freq: Once | INTRAVENOUS | Status: AC | PRN
  Administered 2012-12-21: 17 mL via INTRAVENOUS

## 2012-12-21 MED ORDER — POTASSIUM CHLORIDE 20 MEQ/15ML (10%) PO LIQD
40.0000 meq | Freq: Once | ORAL | Status: AC
  Administered 2012-12-21: 40 meq
  Filled 2012-12-21: qty 30

## 2012-12-21 NOTE — Progress Notes (Signed)
SLP Cancellation Note  Patient Details Name: Johnathan Bowman MRN: [id number] DOB: 09-15-1929   Cancelled treatment:       Reason Eval/Treat Not Completed: Medical issues which prohibited therapy (Patient intubated. MRI pending for possible CVA. ) SLP will f/u Monday 1/6 for potential extubation and MRI results.   Ferdinand Lango MA, CCC-SLP [phone]    Ferdinand Lango Meryl 12/21/2012, 8:31 AM

## 2012-12-21 NOTE — Procedures (Signed)
REFERRING PHYSICIAN:  Dr. Molli Knock.  HISTORY:  A 79 year old male with new onset right facial droop and loss of speech.  MEDICATIONS:  Fentanyl, midazolam, Rocephin.  CONDITIONS OF RECORDING:  This is a 16-channel EEG carried out with the patient in the awake and drowsy states.  DESCRIPTION:  The waking background activity is poorly sustained.  It reaches a maximum of 8 Hz alpha activity, but between the majority of the tracing it is much slower and often noted in the theta range.  Theta and alpha rhythms were seen from the central and temporal regions with theta rhythms being more prominent.   Faster rhythms are noted anteriorly.  With drowse, the patient is noted to have intermittent diffuse polymorphic delta activity that slows the background rhythm.  Stage II sleep is not obtained.  Hyperventilation and intermittent photic stimulation were not performed.  IMPRESSION:  This is an EEG that is characterized by mild slowing.  No epileptiform activity is noted.  This mild slowing may be seen with drowse but cannot rule out the possibility of diffuse disturbance such as metabolic encephalopathy among other possibilities.          ______________________________ Thana Farr, MD    JY:NWGN D:  12/20/2012 18:08:46  T:  12/21/2012 01:31:12  Job #:  [id number]

## 2012-12-21 NOTE — Progress Notes (Signed)
eLink Physician-Brief Progress Note Patient Name: Johnathan Bowman DOB: [birth year] MRN: [id number]  Date of Service  12/21/2012   HPI/Events of Note  Call from nurse reporting oliguria in patient who is HD stable.  Had some transient hypotension earlier in day while sedated for intubation.  Has been net negative.  eICU Interventions  Plan: 500 cc fluid bolus times one for oliguria    Intervention Category Intermediate Interventions: Oliguria - evaluation and management  Yazmin Locher 12/21/2012, 1:54 AM

## 2012-12-21 NOTE — Progress Notes (Signed)
Subjective: Patient intubated and lightly sedated.  Will follow commands.  EEG reviewed and shows no epileptiform activity.    Objective: Current vital signs: BP 140/65  Pulse 79  Temp 98.8 F (37.1 C) (Core (Comment))  Resp 16  Ht 5\' 7"  (1.702 m)  Wt 80.7 kg (177 lb 14.6 oz)  BMI 27.86 kg/m2  SpO2 100% Vital signs in last 24 hours: Temp:  [98.4 F (36.9 C)-100 F (37.8 C)] 98.8 F (37.1 C) (01/03 0900) Pulse Rate:  [33-112] 79  (01/03 0900) Resp:  [7-27] 16  (01/03 0900) BP: (97-170)/(56-86) 140/65 mmHg (01/03 0900) SpO2:  [91 %-100 %] 100 % (01/03 0900) Arterial Line BP: (74-175)/(46-95) 131/95 mmHg (01/03 0900) FiO2 (%):  [39.7 %-100 %] 40 % (01/03 0900) Weight:  [80.7 kg (177 lb 14.6 oz)] 80.7 kg (177 lb 14.6 oz) (01/03 0400)  Intake/Output from previous day: 01/02 0701 - 01/03 0700 In: 2017.6 [I.V.:1467.6; IV Piggyback:550] Out: 725 [Urine:725] Intake/Output this shift: Total I/O In: 167 [I.V.:167] Out: 135 [Urine:135] Nutritional status: NPO  Neurologic Exam: Mental Status:  Lethargic but easily awakened.  Follows simple commands.   Cranial Nerves:  II: Discs flat bilaterally; Visual fields grossly normal, pupils equal, round, reactive to light and accommodation  III,IV, VI: ptosis not present, extra-ocular motions intact bilaterally  V,VII: right facial droop, facial light touch sensation normal bilaterally  VIII: hearing normal bilaterally  IX,X: gag reflex reduced  XI: bilateral shoulder shrug  XII: tongue deviation unable to test Motor:  Lifts both upper extremities against gravity and squeezes my hand to command bilaterally.   Sensory: Pinprick and light touch intact throughout, bilaterally  Deep Tendon Reflexes: 2+ in the upper extremities, trace at the knees and absent at the ankles  Plantars:  Right: upgoing     Left: upgoing  Cerebellar:  Unable to test since patient reaching for ventilator tubing Gait: Unable to test  CV: pulses palpable  throughout    Lab Results: Basic Metabolic Panel:  Lab 12/21/12 4540 12/20/12 0805 12/19/12 0500 12/18/12 0400 12/17/12 0500 12/16/12 0623  NA 141 143 139 136 134* --  K 3.6 4.1 4.1 3.9 3.9 --  CL 103 103 99 99 100 --  CO2 26 26 28 28 24  --  GLUCOSE 131* 141* 265* 148* 125* --  BUN 24* 24* 40* 31* 25* --  CREATININE 1.15 0.85 1.00 1.06 0.91 --  CALCIUM 9.6 9.3 9.2 -- -- --  MG 2.0 -- 1.9 1.7 1.8 1.6  PHOS 2.7 -- 3.5 4.2 4.0 3.9    Liver Function Tests:  Lab 12/15/12 0417 12/15/12 0246  AST 11 12  ALT 5 6  ALKPHOS 93 96  BILITOT 0.3 0.2*  PROT 6.2 6.5  ALBUMIN 3.0* 3.1*    Lab 12/15/12 0418  LIPASE 33  AMYLASE 66   No results found for this basename: AMMONIA:3 in the last 168 hours  CBC:  Lab 12/21/12 0415 12/20/12 0805 12/19/12 0500 12/18/12 0400 12/17/12 0500 12/15/12 0246  WBC 5.6 7.6 6.7 6.4 5.9 --  NEUTROABS -- -- -- -- -- 3.4  HGB 10.2* 11.4* 10.8* 10.2* 9.3* --  HCT 33.6* 37.6* 35.3* 32.3* 30.2* --  MCV 86.8 86.8 86.9 85.7 87.3 --  PLT 287 333 317 276 241 --    Cardiac Enzymes:  Lab 12/15/12 1735 12/15/12 1125 12/15/12 0417  CKTOTAL -- -- --  CKMB -- -- --  CKMBINDEX -- -- --  TROPONINI <0.30 <0.30 <0.30    Lipid Panel:  Lab 12/21/12 0415 12/18/12 0400  CHOL 171 --  TRIG 120 116  HDL 35* --  CHOLHDL 4.9 --  VLDL 24 --  LDLCALC 112* --    CBG:  Lab 12/21/12 0742 12/21/12 0321 12/20/12 2336 12/20/12 2005 12/20/12 1614  GLUCAP 118* 119* 116* 123* 129*    Microbiology: Results for orders placed during the hospital encounter of 12/15/12  CULTURE, BLOOD (ROUTINE X 2)     Status: Normal   Collection Time   12/15/12  2:00 AM      Component Value Range Status Comment   Specimen Description BLOOD HAND LEFT   Final    Special Requests BOTTLES DRAWN AEROBIC AND ANAEROBIC 10CC   Final    Culture  Setup Time 12/15/2012 12:29   Final    Culture NO GROWTH 5 DAYS   Final    Report Status 12/21/2012 FINAL   Final   CULTURE, BLOOD (ROUTINE X 2)      Status: Normal   Collection Time   12/15/12  2:48 AM      Component Value Range Status Comment   Specimen Description BLOOD ARM RIGHT   Final    Special Requests BOTTLES DRAWN AEROBIC AND ANAEROBIC 5CC   Final    Culture  Setup Time 12/15/2012 12:29   Final    Culture NO GROWTH 5 DAYS   Final    Report Status 12/21/2012 FINAL   Final   URINE CULTURE     Status: Normal   Collection Time   12/15/12  2:49 AM      Component Value Range Status Comment   Specimen Description URINE, RANDOM   Final    Special Requests NONE   Final    Culture  Setup Time 12/15/2012 12:36   Final    Colony Count NO GROWTH   Final    Culture NO GROWTH   Final    Report Status 12/16/2012 FINAL   Final   MRSA PCR SCREENING     Status: Normal   Collection Time   12/15/12  5:30 AM      Component Value Range Status Comment   MRSA by PCR NEGATIVE  NEGATIVE Final   CULTURE, RESPIRATORY     Status: Normal   Collection Time   12/17/12  8:30 PM      Component Value Range Status Comment   Specimen Description TRACHEAL ASPIRATE   Final    Special Requests NONE   Final    Gram Stain     Final    Value: FEW WBC PRESENT,BOTH PMN AND MONONUCLEAR     RARE SQUAMOUS EPITHELIAL CELLS PRESENT     NO ORGANISMS SEEN   Culture MODERATE CANDIDA ALBICANS   Final    Report Status 12/20/2012 FINAL   Final     Coagulation Studies: No results found for this basename: LABPROT:5,INR:5 in the last 72 hours  Imaging: Dg Chest Port 1 View  12/20/2012  *RADIOLOGY REPORT*  Clinical Data: Line placement.  PORTABLE CHEST - 1 VIEW  Comparison: 12/20/2012.  Findings: Endotracheal tube terminates at the carina.  Left IJ central line tip projects over the left brachiocephalic vein. Right IJ Port-A-Cath tip is unchanged over the SVC.  Heart size is grossly stable.  Lungs are very low in volume with bibasilar air space disease, left greater than right, and a left pleural effusion.  No definite pneumothorax.  IMPRESSION:  1.  Endotracheal tube  is low lying.  Pulling back approximately 3 cm would  better position the tip above the carina. These results will be called to the ordering clinician or representative by the Radiologist Assistant, and communication documented in the PACS Dashboard. 2.  No pneumothorax after left IJ central line insertion. 3.  Bibasilar air space disease, left greater than right, and left pleural effusion.   Original Report Authenticated By: Leanna Battles, M.D.    Dg Chest Port 1 View  12/20/2012  *RADIOLOGY REPORT*  Clinical Data: Pneumonia  PORTABLE CHEST - 1 VIEW  Comparison: Yesterday  Findings: Endotracheal and NG tubes removed.  Stable right internal jugular vein Port-A-Cath.  Left lower lobe consolidation is stable. Right base atelectasis is stable.  No pneumothorax.  IMPRESSION: Extubated.  Stable left lower lobe consolidation.  Stable right base atelectasis.   Original Report Authenticated By: Jolaine Click, M.D.    Dg Abd Portable 1v  12/21/2012  *RADIOLOGY REPORT*  Clinical Data: NG tube placement  PORTABLE ABDOMEN - 1 VIEW  Comparison: 12/18/2012  Findings: 0752 hours.  Bowel gas pattern is nonspecific.  NG tube tip overlies the mid stomach.  Right renal stone again noted.  IVC filter is visualized in situ and peri  IMPRESSION: NG tube tip overlies the mid stomach.   Original Report Authenticated By: Kennith Center, M.D.     Medications:  I have reviewed the patient's current medications. Scheduled:   . antiseptic oral rinse  15 mL Mouth Rinse QID  . antiseptic oral rinse  15 mL Mouth Rinse QID  . cefTRIAXone (ROCEPHIN)  IV  1 g Intravenous Q24H  . chlorhexidine  15 mL Mouth Rinse BID  . heparin  5,000 Units Subcutaneous Q8H  . insulin aspart  2-6 Units Subcutaneous Q4H  . multivitamin  5 mL Per Tube Daily  . pantoprazole (PROTONIX) IV  40 mg Intravenous Q24H    Assessment/Plan:  Patient Active Hospital Problem List:  Facial weakness/Mutism (12/20/2012)   Assessment: Patient unchanged EEG shows no  evidence of seizure activity.  MRI of the brain pending.   Plan: Will follow up results of MRI with further recommendations to be made at that time.      LOS: 6 days   Thana Farr, MD [facility] [phone] 12/21/2012  10:46 AM

## 2012-12-21 NOTE — Progress Notes (Signed)
Name: Johnathan Bowman MRN: [id number] DOB: 10-08-1929    LOS: 6  PULMONARY / CRITICAL CARE MEDICINE  HPI:   79 years old male with PMH relevant for HTN, DM, dyslipidemia, GERD, CAD, DVT. Diagnosed with metastatic (stage IV) non small cell lung cancer in November 2011 ([facility]). Treated since then with chemotherapy. Last cycle given in October 2013. He has history of a left sided malignant pleural effusion (cytology from 01/05/12) and had a Pleurex catheter placed in the past. History obtained from records. Patient unable to provide history due to intubation. Wife was present at bedside but very poor historian. Apparently he has been experiencing progressive SOB, productive cough and chills for one week. On the day of admission he was found by his wife with AMS, some questionable chest pain and worsening respiratory distress. At arrival to the ED he was hypoxic and was started on BIPAP. His mental status deteriorated and had to be intubated. At the time of my exam the patient is intubated on mechanical ventilation, sedated with propofol, hemodynamically stable.  Vital Signs: Temp:  [98.4 F (36.9 C)-100 F (37.8 C)] 98.8 F (37.1 C) (01/03 0900) Pulse Rate:  [33-114] 79  (01/03 0900) Resp:  [7-27] 16  (01/03 0900) BP: (97-170)/(56-86) 140/65 mmHg (01/03 0900) SpO2:  [91 %-100 %] 100 % (01/03 0900) Arterial Line BP: (74-175)/(46-95) 131/95 mmHg (01/03 0900) FiO2 (%):  [39.7 %-100 %] 40 % (01/03 0900) Weight:  [80.7 kg (177 lb 14.6 oz)] 80.7 kg (177 lb 14.6 oz) (01/03 0400)  Physical Examination: General:  Reintubated, sedate this AM. Neuro: Somewhat arousable, non-focal. HEENT:  PERRL, pink conjunctivae, moist membranes Neck:  Supple, no JVD   Cardiovascular:  RRR, no M/R/G Lungs:  Bilateral diffuse crackles, no wheezing Abdomen:  Soft, nontender, nondistended, bowel sounds present Musculoskeletal:  Moves all extremities, no pedal edema Skin:  No rash.  ASSESSMENT AND  PLAN  PULMONARY  Lab 12/20/12 1247 12/20/12 0059 12/19/12 0500 12/18/12 0420 12/17/12 0355  PHART 7.458* 7.419 7.465* 7.443 7.425  PCO2ART 35.9 45.4* 39.3 40.0 38.2  PO2ART 292.0* 66.1* 83.4 71.8* 77.0*  HCO3 25.0* 28.9* 27.8* 27.0* 24.6*  O2SAT 99.9 92.4 95.8 92.6 96.7   Ventilator Settings: Vent Mode:  [-] PRVC FiO2 (%):  [39.7 %-100 %] 40 % Set Rate:  [16 bmp] 16 bmp Vt Set:  [600 mL] 600 mL PEEP:  [5 cmH20] 5 cmH20 Plateau Pressure:  [10 cmH20-23 cmH20] 23 cmH20 CXR:  Lt ASD and effusion ETT:  12/28>>>1/1  A:   1) Health care associated pneumonia 2) Acute on chronic respiratory failure 3) Loculated malignant left pleural effusion P:   - Reintubated for complete inability to protect airway. - PS trials but no extubation until neurology is done with work up as to cause of inability to protect airway, met vs cva. - May need Pleurex catheter placement but will need more discussion with family regarding plan of care since patient does not wish for trach/peg. - Albuterol/ipratropium PRN. - Short term intubation only no trach/peg.  CARDIOVASCULAR  Lab 12/15/12 1735 12/15/12 1125 12/15/12 0417 12/15/12 0247 12/15/12 0246  TROPONINI <0.30 <0.30 <0.30 -- --  LATICACIDVEN -- -- -- -- 2.5*  PROBNP -- -- -- 95.3 --   ECG:  Sinus tachycardia Lines: Left port-A-cath. Peripheral IV's  A:  Hemodynamically stable after transient hypotension due to sedation. P:  - D5 1/2 NS at 50 ml/hr, will d/c once TF are at goal. - Restart TF.  RENAL  BMET Lab  Results  Component Value Date   CREATININE 1.15 12/21/2012   BUN 24* 12/21/2012   NA 141 12/21/2012   K 3.6 12/21/2012   CL 103 12/21/2012   CO2 26 12/21/2012   Foley:  12/15/12  Intake/Output Summary (Last 24 hours) at 12/21/12 0948 Last data filed at 12/21/12 0800  Gross per 24 hour  Intake 1984.57 ml  Output    705 ml  Net 1279.57 ml   A:   1) Normal kidney function. P:   - Will follow BMP. - Electrolyte replacement as  needed.  GASTROINTESTINAL  Lab 12/15/12 0417 12/15/12 0246  AST 11 12  ALT 5 6  ALKPHOS 93 96  BILITOT 0.3 0.2*  PROT 6.2 6.5  ALBUMIN 3.0* 3.1*   A:   1) No issues P:   - GI prophylaxis with protonix. - Restart TF.  HEMATOLOGIC  Lab 12/21/12 0415 12/20/12 0805 12/19/12 0500 12/18/12 0400 12/17/12 0500 12/15/12 0246  HGB 10.2* 11.4* 10.8* 10.2* 9.3* --  HCT 33.6* 37.6* 35.3* 32.3* 30.2* --  PLT 287 333 317 276 241 --  INR -- -- -- -- -- 1.05  APTT -- -- -- -- -- 34   A:   1) Anemia likely due to chronic disease P:  - Will follow CBC  INFECTIOUS  Lab 12/21/12 0415 12/20/12 0805 12/19/12 0500 12/18/12 0400 12/17/12 0500 12/15/12 0417  WBC 5.6 7.6 6.7 6.4 5.9 --  PROCALCITON -- -- -- -- -- <0.10   Cultures: Blood 12/28>>>NTD Urine 12/28>>>NTD Sputum 12/28>>>NTD Antibiotics: Cefepime 12/28>>>12/31 Vancomycin 12/28>>>12/31 Rocephin 12/31>>>  A:   1) Health care associated pneumonia P:   - D/Ced cefepime and vanc. - Started rocephin 12/31 and continue for 5 more days. - May require drainage of fluid but will monitor for now since clinically patient is showing no signs of being septic/toxic.  ENDOCRINE  Lab 12/21/12 0742 12/21/12 0321 12/20/12 2336 12/20/12 2005 12/20/12 1614  GLUCAP 118* 119* 116* 123* 129*   A:   1) DM P:   - Novolog sliding scale. (ICU protocol)  NEUROLOGIC  A:   1) Altered mental status likely secondary to hypoxia and metabolic acidosis.  Acute metabolic encephalopathy. P:   - CT scan of the head with no acute stroke. - MRI of the brain for stroke vs met still pending. - EEG diffuse slowness but the physical exam findings from yesterday are entirely too focal for patient to have a metabolic/toxic encephalpathy. - Continue precedex.  If mets vs CVA will recommend extubation with no intention to reintubate when fails then palliation since patient does not wish for trach/peg.  CC time 35 min.  Alyson Reedy, M.D. [facility]  Pulmonary/Critical Care Medicine. Pager: [phone]. After hours pager: [phone].

## 2012-12-22 ENCOUNTER — Inpatient Hospital Stay (HOSPITAL_COMMUNITY): Payer: Medicare Other

## 2012-12-22 LAB — BASIC METABOLIC PANEL
CO2: 25 mEq/L (ref 19–32)
Chloride: 106 mEq/L (ref 96–112)
GFR calc Af Amer: 89 mL/min — ABNORMAL LOW (ref >90)
Potassium: 3.6 mEq/L (ref 3.5–5.1)
Sodium: 141 mEq/L (ref 135–145)

## 2012-12-22 LAB — BLOOD GAS, ARTERIAL
Acid-base deficit: 1.1 mmol/L (ref 0.0–2.0)
Bicarbonate: 22.9 mEq/L (ref 20.0–24.0)
FIO2: 0.4 %
O2 Saturation: 97.3 %
PEEP: 5 cmH2O
TCO2: 24 mmol/L (ref 0–100)
pO2, Arterial: 104 mmHg — ABNORMAL HIGH (ref 80.0–100.0)

## 2012-12-22 LAB — GLUCOSE, CAPILLARY
Glucose-Capillary: 117 mg/dL — ABNORMAL HIGH (ref 70–99)
Glucose-Capillary: 156 mg/dL — ABNORMAL HIGH (ref 70–99)
Glucose-Capillary: 135 mg/dL — ABNORMAL HIGH (ref 70–99)

## 2012-12-22 LAB — CBC
Hemoglobin: 9.9 g/dL — ABNORMAL LOW (ref 13.0–17.0)
RBC: 3.66 MIL/uL — ABNORMAL LOW (ref 4.22–5.81)

## 2012-12-22 LAB — MAGNESIUM: Magnesium: 2 mg/dL (ref 1.5–2.5)

## 2012-12-22 LAB — PHOSPHORUS: Phosphorus: 3.3 mg/dL (ref 2.3–4.6)

## 2012-12-22 MED ORDER — FUROSEMIDE 10 MG/ML IJ SOLN
INTRAMUSCULAR | Status: AC
  Filled 2012-12-22: qty 4

## 2012-12-22 MED ORDER — BISACODYL 10 MG RE SUPP
10.0000 mg | Freq: Every day | RECTAL | Status: DC | PRN
  Administered 2012-12-24: 10 mg via RECTAL
  Filled 2012-12-22: qty 1

## 2012-12-22 MED ORDER — ENOXAPARIN SODIUM 40 MG/0.4ML ~~LOC~~ SOLN
40.0000 mg | SUBCUTANEOUS | Status: DC
Start: 2012-12-22 — End: 2012-12-31
  Administered 2012-12-22 – 2012-12-30 (×9): 40 mg via SUBCUTANEOUS
  Filled 2012-12-22 (×10): qty 0.4

## 2012-12-22 MED ORDER — ASPIRIN 325 MG PO TABS
325.0000 mg | ORAL_TABLET | Freq: Every day | ORAL | Status: DC
  Administered 2012-12-22 – 2012-12-26 (×5): 325 mg via ORAL
  Filled 2012-12-22 (×6): qty 1

## 2012-12-22 MED ORDER — FUROSEMIDE 10 MG/ML IJ SOLN
20.0000 mg | Freq: Two times a day (BID) | INTRAMUSCULAR | Status: DC
  Administered 2012-12-22 – 2012-12-23 (×3): 20 mg via INTRAVENOUS
  Filled 2012-12-22 (×3): qty 2

## 2012-12-22 NOTE — Progress Notes (Signed)
Stroke Team Progress Note  HISTORY Johnathan Bowman is an 79 y.o. male who has a history of metastatic non small cell lung cancer undergoing chemotherapy. On the day of admission was at baseline. His wife went to church. When she returned he was unable to speak and was sitting down . He tried to call 911 but was unable to speak to the operator. Was brought to the hospital and at that time the complaints were of SOB and difficulty breathing. Patient was intubated. On extubation found to have difficulty swallowing and right facial droop. Neuro consult v=called 12/20/12 for further evaluation.  Last imaging was performed on 07/10/12 was an MRI of the brain and showed areas of enhancement. There was some question as to whether these were infarctions or metastatic disease. Head CT on admission showed no acute changes.  LSN: [id number] 12/15/2012  tPA Given: No: Outside time window  He was admitted to the neuro ICU for further evaluation and treatment.  SUBJECTIVE He is intubated, sedated and alone in the room. He follows some commands.  OBJECTIVE Most recent Vital Signs: Filed Vitals:   12/22/12 0900 12/22/12 1000 12/22/12 1100 12/22/12 1132  BP: 151/75 156/74 133/68 133/68  Pulse: 66 56 90 88  Temp: 99 F (37.2 C) 98.6 F (37 C) 99 F (37.2 C) 98.8 F (37.1 C)  TempSrc:    Core (Comment)  Resp: 16 16 16 16   Height:      Weight:      SpO2: 100% 98% 99% 99%   CBG (last 3)   Basename 12/22/12 0920 12/22/12 0436 12/22/12 0007  GLUCAP 156* 183* 117*    IV Fluid Intake:     . dexmedetomidine Stopped (12/19/12 1830)  . dextrose 5 % and 0.45% NaCl 50 mL/hr at 12/22/12 0912  . feeding supplement (JEVITY 1.2 CAL) 1,000 mL (12/21/12 2223)  . fentaNYL infusion INTRAVENOUS 50 mcg/hr (12/22/12 1100)  . midazolam (VERSED) infusion 2 mg/hr (12/22/12 1100)    MEDICATIONS    . antiseptic oral rinse  15 mL Mouth Rinse QID  . aspirin  325 mg Oral Daily  . chlorhexidine  15 mL Mouth Rinse BID  . feeding  supplement  30 mL Per Tube TID  . heparin  5,000 Units Subcutaneous Q8H  . insulin aspart  2-6 Units Subcutaneous Q4H  . multivitamin  5 mL Per Tube Daily  . pantoprazole (PROTONIX) IV  40 mg Intravenous Q24H   PRN:  sodium chloride, acetaminophen (TYLENOL) oral liquid 160 mg/5 mL, acetaminophen, albuterol, fentaNYL, fentaNYL, midazolam  Diet:  NPO - Feeding supplement per tube Activity:  Bedrest DVT Prophylaxis:  [place] Heparin  CLINICALLY SIGNIFICANT STUDIES Basic Metabolic Panel:  Lab 12/22/12 1610 12/21/12 0415  NA 141 141  K 3.6 3.6  CL 106 103  CO2 25 26  GLUCOSE 220* 131*  BUN 23 24*  CREATININE 0.93 1.15  CALCIUM 9.1 9.6  MG 2.0 2.0  PHOS 3.3 2.7   Liver Function Tests: No results found for this basename: AST:2,ALT:2,ALKPHOS:2,BILITOT:2,PROT:2,ALBUMIN:2 in the last 168 hours CBC:  Lab 12/22/12 0453 12/21/12 0415  WBC 6.3 5.6  NEUTROABS -- --  HGB 9.9* 10.2*  HCT 31.7* 33.6*  MCV 86.6 86.8  PLT 247 287   Coagulation: No results found for this basename: LABPROT:4,INR:4 in the last 168 hours Cardiac Enzymes:  Lab 12/15/12 1735  CKTOTAL --  CKMB --  CKMBINDEX --  TROPONINI <0.30   Urinalysis: No results found for this basename: COLORURINE:2,APPERANCEUR:2,LABSPEC:2,PHURINE:2,GLUCOSEU:2,HGBUR:2,BILIRUBINUR:2,KETONESUR:2,PROTEINUR:2,UROBILINOGEN:2,NITRITE:2,LEUKOCYTESUR:2 in the last  168 hours Lipid Panel    Component Value Date/Time   CHOL 171 12/21/2012 0415   TRIG 120 12/21/2012 0415   HDL 35* 12/21/2012 0415   CHOLHDL 4.9 12/21/2012 0415   VLDL 24 12/21/2012 0415   LDLCALC 112* 12/21/2012 0415   HgbA1C  Lab Results  Component Value Date   HGBA1C 6.6* 12/21/2012    Urine Drug Screen:   No results found for this basename: labopia, cocainscrnur, labbenz, amphetmu, thcu, labbarb    Alcohol Level: No results found for this basename: ETH:2 in the last 168 hours  [facility] Wo Contrast  12/21/2012   IMPRESSION:  Fluctuating findings as detailed above.  It is possible  these changes are related to the infarcts of the various ages and in different vascular distributions which raises possibility of embolic disease or vasculitis.  As the patient has a history of stage IV lung cancer, tumor is not entirely excluded as contributing to findings.  However, prior to radiation therapy, follow-up Johnathan scan in 3 weeks may be considered as I favor constellation of findings more likely related to infarction rather than tumor.  Cerebritis could not be excluded in the proper clinical setting given the degree of swelling of the posterior left frontal lobe abnormality (does not appear to be case clinically per phone conversation with Dr. Thad Ranger 12/21/2022 at to 3:00 p.m.).    MRA HEAD  IMPRESSION: Intracranial atherosclerotic type changes as detailed above.      Dg Chest Port 1 View  12/22/2012  IMPRESSION: Tip of endotracheal tube approximately 2.9 cm above carina. Otherwise no change.     Dg Chest Port 1 View  12/20/12  IMPRESSION:  1.  Endotracheal tube is low lying.  Pulling back approximately 3 cm would better position the tip above the carina. These results will be called to the ordering clinician or representative by the Radiologist Assistant, and communication documented in the PACS Dashboard. 2.  No pneumothorax after left IJ central line insertion. 3.  Bibasilar air space disease, left greater than right, and left pleural effusion.     Dg Abd Portable 1v  12/21/2012   IMPRESSION: NG tube tip overlies the mid stomach.    CT of the brain 12/15/12   1. Known areas of gyriform enhancement seen on the prior MRI are  not well characterized on CT, but may reflect tumor or infarct. As  previously recommended, a follow-up MRI should be performed when  possible (as a 6-month follow-up from the prior study).  2. 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  Mild cortical volume loss  noted. Small chronic lacunar infarct at the right cerebellar  hemisphere.  3. Scattered small vessel ischemic microangiopathy.    2D Echocardiogram  - not ordered  Carotid Doppler  - not ordered  EKG - pending  Therapy Recommendations - - Pending  Physical Exam Per Dr Loretha Brasil  ASSESSMENT Johnathan. DEMONTE Bowman is a 79 y.o. male presenting with inability to speak, right facial droop and dysphagia.  Imaging confirms infarct vs metastatic disease.  Work up underway. On aspirin 81 mg orally every day prior to admission. Now on aspirin 325 mg orally every day for secondary stroke prevention. Patient currently intubated with right sided weakness.   VDRF  Htn  DM  Dyslipidemia - consider statin  CAD  Metastatic non small cell lung CA undergoing chemotherapy  DVT hx  EEG - preliminary report - no seizure activity noted.  Hospital day # 7  TREATMENT/PLAN  Aspirin 325 mg daily started today.  Repeat MRI in 7 to 10 days with contrast.  Consider echo / carotid doppler.   Delton See PA-C [facility] Pager [phone] 12/22/2012, 12:17 PM  I have personally obtained a history, examined the patient, evaluated imaging results, and formulated the assessment and plan of care. I agree with the above.

## 2012-12-22 NOTE — Progress Notes (Signed)
Name: Johnathan Bowman MRN: [id number] DOB: 12/18/1929    LOS: 7  PULMONARY / CRITICAL CARE MEDICINE  HPI:   79 years old male with PMH relevant for HTN, DM, dyslipidemia, GERD, CAD, DVT. Diagnosed with metastatic (stage IV) non small cell lung cancer in November 2011 (LUL). Treated since then with chemotherapy. Last cycle given in October 2013. He has history of a left sided malignant pleural effusion (cytology from 01/05/12) and had a Pleurex catheter placed in the past. Being managed for vrdf met brain vs infarct.  Vital Signs: Temp:  [98.6 F (37 C)-100 F (37.8 C)] 99 F (37.2 C) (01/04 0700) Pulse Rate:  [43-96] 84  (01/04 0744) Resp:  [13-22] 16  (01/04 0744) BP: (117-170)/(6-90) 134/67 mmHg (01/04 0744) SpO2:  [93 %-100 %] 100 % (01/04 0744) Arterial Line BP: (104-168)/(65-113) 117/112 mmHg (01/03 1700) FiO2 (%):  [39.8 %-40.2 %] 40 % (01/04 0744)  Physical Examination: General:  intubated, sedated this AM Neuro: Somewhat arousable, non-focal. wds to stimuli HEENT:  PERRL, pink conjunctivae, moist membranes Neck:  Supple, no JVD  , left IJ cvl Cardiovascular:  RRR, no M/R/G Lungs:  Bilateral diffuse crackles, no wheezing Abdomen:  Soft, nontender, nondistended, bowel sounds present Musculoskeletal:  Moves all extremities, no pedal edema Skin:  No rash.  ASSESSMENT AND PLAN  PULMONARY  Lab 12/22/12 0425 12/20/12 1247 12/20/12 0059 12/19/12 0500 12/18/12 0420  PHART 7.410 7.458* 7.419 7.465* 7.443  PCO2ART 36.8 35.9 45.4* 39.3 40.0  PO2ART 104.0* 292.0* 66.1* 83.4 71.8*  HCO3 22.9 25.0* 28.9* 27.8* 27.0*  O2SAT 97.3 99.9 92.4 95.8 92.6   Ventilator Settings: Vent Mode:  [-] PRVC FiO2 (%):  [39.8 %-40.2 %] 40 % Set Rate:  [16 bmp] 16 bmp Vt Set:  [600 mL] 600 mL PEEP:  [5 cmH20] 5 cmH20 Plateau Pressure:  [15 cmH20-24 cmH20] 24 cmH20 .    Dg Chest Port 1 View  12/22/2012  *RADIOLOGY REPORT*  Clinical Data: Evaluate endotracheal tube position  PORTABLE CHEST - 1  VIEW  Comparison: Portable exam 0515 hours compared to 12/20/2012  Findings: Tip of endotracheal tube approximately 2.9 cm above carina. Nasogastric tube extends into stomach. Right jugular Port-A-Cath and left jugular line unchanged. Low lung volumes with right basilar atelectasis and atelectasis versus consolidation at left lung base. No pneumothorax. Atherosclerotic calcification aorta.  IMPRESSION: Tip of endotracheal tube approximately 2.9 cm above carina. Otherwise no change.   Original Report Authenticated By: Ulyses Southward, M.D.      ETT:  12/28>>>1/1>>1/2 reintubated>>  A:   1) Health care associated pneumonia 2) Acute on chronic respiratory failure 3) Loculated malignant left pleural effusion P:   - Reintubated 1-2 for complete inability to protect airway. - PS trials but no extubation until neurology is done with work up as to cause of inability to protect airway, met vs cva. - May need Pleurex catheter placement but will need more discussion with family regarding plan of care since patient does not wish for trach/peg. - Albuterol/ipratropium PRN. - Short term intubation only no trach/peg. Today plan is cpap 5 ps 5, escalate ps if needed -consider neg balance  CARDIOVASCULAR  Lab 12/15/12 1735 12/15/12 1125  TROPONINI <0.30 <0.30  LATICACIDVEN -- --  PROBNP -- --   ECG:  Sinus tachycardia Lines: Left port-A-cath. Peripheral IV's, left i j cvl  A:  Hemodynamically stable after transient hypotension due to sedation. P:  - D5 1/2 NS at 50 ml/hr, kvo, now - Restarted TF.  RENAL  BMET Lab Results  Component Value Date   CREATININE 0.93 12/22/2012   BUN 23 12/22/2012   NA 141 12/22/2012   K 3.6 12/22/2012   CL 106 12/22/2012   CO2 25 12/22/2012   Foley:  12/15/12  Intake/Output Summary (Last 24 hours) at 12/22/12 0834 Last data filed at 12/22/12 0800  Gross per 24 hour  Intake   1088 ml  Output    930 ml  Net    158 ml   A:   1) Normal kidney function. P:   - bemt  noted Lasix Chem in am   GASTROINTESTINAL No results found for this basename: AST:5,ALT:5,ALKPHOS:5,BILITOT:5,PROT:5,ALBUMIN:5 in the last 168 hours A:   1) No issues P:   - GI prophylaxis with protonix. - Restarted TF. eval last BM, none since 1/1, add dulc supp  HEMATOLOGIC  Lab 12/22/12 0453 12/21/12 0415 12/20/12 0805 12/19/12 0500 12/18/12 0400  HGB 9.9* 10.2* 11.4* 10.8* 10.2*  HCT 31.7* 33.6* 37.6* 35.3* 32.3*  PLT 247 287 333 317 276  INR -- -- -- -- --  APTT -- -- -- -- --   A:   1) Anemia likely due to chronic disease P:  - Will follow CBC -sub  q heparin noted, likely more effective in setting cancer, change to lovenox  INFECTIOUS  Lab 12/22/12 0453 12/21/12 0415 12/20/12 0805 12/19/12 0500 12/18/12 0400  WBC 6.3 5.6 7.6 6.7 6.4  PROCALCITON -- -- -- -- --   Cultures: Blood 12/28>>>NTD Urine 12/28>>>NTD Sputum 12/28>>>NTD Antibiotics: Cefepime 12/28>>>12/31 Vancomycin 12/28>>>12/31 Rocephin 12/31>>>stop date 1/5  A:   1) Health care associated pneumonia P:   - rocephin to stop date, allow to dc - May require drainage of fluid but will monitor for now since clinically patient is showing no signs of being septic/toxic.  ENDOCRINE  Lab 12/22/12 0436 12/22/12 0007 12/21/12 1956 12/21/12 1559 12/21/12 1200  GLUCAP 183* 117* 132* 127* 131*   A:   1) DM P:   - Novolog sliding scale. (ICU protocol) May need to escalate lantus  NEUROLOGIC  A:   1) Altered mental status likely secondary to hypoxia and metabolic acidosis.  Acute metabolic encephalopathy. cva vs met per neuro P:   - CT scan of the head with no acute stroke. - MRI of the brain for stroke vs met reviewed - EEG diffuse slowness but the physical exam findings from yesterday are entirely too focal for patient to have a metabolic/toxic encephalpathy.  May require comfort care  [facility] Minor ACNP Ad[facility] Pollack PCCM Pager [phone] till 3 pm If no answer page [phone] 12/22/2012, 8:42  AM  After hours pager: [phone].  I have fully examined this patient and agree with above findings.    And edited infull  Ccm time 30 min   Mcarthur Rossetti. Tyson Alias, MD, FACP Pgr: [phone] [facility]

## 2012-12-22 NOTE — Progress Notes (Signed)
MRI in 7 days from initial one to confirm stroke will s/oof call with questions.

## 2012-12-23 ENCOUNTER — Inpatient Hospital Stay (HOSPITAL_COMMUNITY): Payer: Medicare Other

## 2012-12-23 LAB — BASIC METABOLIC PANEL
CO2: 25 mEq/L (ref 19–32)
Calcium: 9.1 mg/dL (ref 8.4–10.5)
Creatinine, Ser: 1 mg/dL (ref 0.50–1.35)
GFR calc non Af Amer: 68 mL/min — ABNORMAL LOW (ref >90)
Glucose, Bld: 136 mg/dL — ABNORMAL HIGH (ref 70–99)

## 2012-12-23 LAB — CBC
MCH: 26.5 pg (ref 26.0–34.0)
MCHC: 30.7 g/dL (ref 30.0–36.0)
MCV: 86.2 fL (ref 78.0–100.0)
Platelets: 245 10*3/uL (ref 150–400)
RBC: 3.7 MIL/uL — ABNORMAL LOW (ref 4.22–5.81)

## 2012-12-23 LAB — GLUCOSE, CAPILLARY: Glucose-Capillary: 168 mg/dL — ABNORMAL HIGH (ref 70–99)

## 2012-12-23 MED ORDER — POTASSIUM CHLORIDE 20 MEQ/15ML (10%) PO LIQD
40.0000 meq | ORAL | Status: AC
  Administered 2012-12-23 (×2): 40 meq
  Filled 2012-12-23 (×2): qty 30

## 2012-12-23 MED ORDER — FUROSEMIDE 10 MG/ML IJ SOLN
40.0000 mg | Freq: Two times a day (BID) | INTRAMUSCULAR | Status: AC
  Administered 2012-12-24: 40 mg via INTRAVENOUS
  Filled 2012-12-23: qty 4

## 2012-12-23 NOTE — Progress Notes (Signed)
During course of the shift, patient's son Johnathan Bowman [phone] whom resides in [place] and daughter Johnathan Bowman [phone] whom resides in [place] came to visit with patient. Both children verbalized interest in speaking with a physician regarding plan of care/patient prognosis. I spoke with both at length in regards to speaking with their mother and perhaps scheduling a family conference with attending physician to further discuss goals of care.

## 2012-12-23 NOTE — Progress Notes (Signed)
Name: Johnathan Bowman MRN: [id number] DOB: 04-05-1929    LOS: 8  PULMONARY / CRITICAL CARE MEDICINE  HPI:   79 years old male with PMH relevant for HTN, DM, dyslipidemia, GERD, CAD, DVT. Diagnosed with metastatic (stage IV) non small cell lung cancer in November 2011 (LUL). Treated since then with chemotherapy. Last cycle given in October 2013. He has history of a left sided malignant pleural effusion (cytology from 01/05/12) and had a Pleurex catheter placed in the past. Being managed for vrdf met brain vs infarct.  Vital Signs: Temp:  [98.2 F (36.8 C)-100.2 F (37.9 C)] 98.6 F (37 C) (01/05 0600) Pulse Rate:  [46-120] 72  (01/05 0600) Resp:  [14-25] 16  (01/05 0600) BP: (98-180)/(53-85) 121/68 mmHg (01/05 0600) SpO2:  [95 %-100 %] 100 % (01/05 0600) FiO2 (%):  [39.7 %-40.3 %] 39.7 % (01/05 0600) Weight:  [81.3 kg (179 lb 3.7 oz)] 81.3 kg (179 lb 3.7 oz) (01/05 0500)  Physical Examination: General:  intubated, sedated this AM, but agitated when sedation turned off.Neuro: Somewhat arousable, non-focal. wds to stimuli.  Intermittently follows commands HEENT:  PERRL, pink conjunctivae, moist membranes Neck:  Supple, no JVD  , left IJ cvl Cardiovascular:  RRR, no M/R/G Lungs:  Bilateral diffuse crackles, no wheezing Abdomen:  Soft, nontender, nondistended, bowel sounds present, tf at goal Musculoskeletal:  Moves all extremities, no pedal edema Skin:  No rash.  ASSESSMENT AND PLAN  PULMONARY  Lab 12/22/12 0425 12/20/12 1247 12/20/12 0059 12/19/12 0500 12/18/12 0420  PHART 7.410 7.458* 7.419 7.465* 7.443  PCO2ART 36.8 35.9 45.4* 39.3 40.0  PO2ART 104.0* 292.0* 66.1* 83.4 71.8*  HCO3 22.9 25.0* 28.9* 27.8* 27.0*  O2SAT 97.3 99.9 92.4 95.8 92.6   Ventilator Settings: Vent Mode:  [-] PRVC FiO2 (%):  [39.7 %-40.3 %] 39.7 % Set Rate:  [16 bmp] 16 bmp Vt Set:  [600 mL] 600 mL PEEP:  [5 cmH20] 5 cmH20 Plateau Pressure:  [21 cmH20-24 cmH20] 22 cmH20     Dg Chest Port 1  View  12/23/2012  *RADIOLOGY REPORT*  Clinical Data: Check endotracheal tube.  PORTABLE CHEST - 1 VIEW  Comparison: 12/22/2012  Findings: Endotracheal tube is 3.8 cm above the carina.  Port-A- Cath tip in the SVC region.  Nasogastric tube is near the gastric pylorus.  Persistent left basilar densities are suggestive for pleural fluid and consolidation.  Left jugular central line in the left innominate vein region and unchanged.  The patient continues to have low lung volumes.  Heart is obscured by the left basilar densities.  IMPRESSION: Stable chest radiograph findings.  Persistent left basilar densities are suggestive for pleural fluid and consolidation.  Support apparatuses as described.   Original Report Authenticated By: Richarda Overlie, M.D.    ETT:  12/28>>>1/1>>1/2 reintubated>>  A:   1) Health care associated pneumonia 2) Acute on chronic respiratory failure 3) Loculated malignant left pleural effusion P:   - May need Pleurex catheter placement but will need more discussion with family regarding plan of care since patient does not wish for trach/peg. - Albuterol/ipratropium PRN. - Short term intubation only no trach/peg. -Wean as tolerated but no extubation, ps 10 goal met, then to 5/5 -consider neg balance further  CARDIOVASCULAR No results found for this basename: TROPONINI:5,LATICACIDVEN:5, O2SATVEN:5,PROBNP:5 in the last 168 hours ECG:  Sinus tachycardia Lines: Left port-A-cath. Peripheral IV's, left i j cvl  A:  Hemodynamically stable after transient hypotension due to sedation. P:  - D5 1/2 NS at  50 ml/hr, kvo, now  RENAL  BMET Lab Results  Component Value Date   CREATININE 1.00 12/23/2012   BUN 25* 12/23/2012   NA 136 12/23/2012   K 3.1* 12/23/2012   CL 99 12/23/2012   CO2 25 12/23/2012   Foley:  12/15/12  Intake/Output Summary (Last 24 hours) at 12/23/12 0754 Last data filed at 12/23/12 1610  Gross per 24 hour  Intake 2242.6 ml  Output   1605 ml  Net  637.6 ml   A:   1)  Normal kidney function. P:   Lasix to neg balance needed, not successful last 24 hrs Follow chemistries   Replete K+ as needed  GASTROINTESTINAL No results found for this basename: AST:5,ALT:5,ALKPHOS:5,BILITOT:5,PROT:5,ALBUMIN:5 in the last 168 hours A:   1) No issues P:   - GI prophylaxis with protonix. - Restarted TF. eval last BM, none since 1/1, add dulc supp  HEMATOLOGIC  Lab 12/23/12 0330 12/22/12 0453 12/21/12 0415 12/20/12 0805 12/19/12 0500  HGB 9.8* 9.9* 10.2* 11.4* 10.8*  HCT 31.9* 31.7* 33.6* 37.6* 35.3*  PLT 245 247 287 333 317  INR -- -- -- -- --  APTT -- -- -- -- --   A:   1) Anemia likely due to chronic disease P:  - Will follow CBC -sub  q heparin noted, likely more effective in setting cancer, change to Lovenox 1-4  INFECTIOUS  Lab 12/23/12 0330 12/22/12 0453 12/21/12 0415 12/20/12 0805 12/19/12 0500  WBC 6.0 6.3 5.6 7.6 6.7  PROCALCITON -- -- -- -- --   Cultures: Blood 12/28>>>Neg Urine 12/28>>>Neg Sputum 12/28>>>Moderate yeast Antibiotics: Cefepime 12/28>>>12/31 Vancomycin 12/28>>>12/31 Rocephin 12/31>>>stop date 1/5  A:   1) Health care associated pneumonia P:   - rocephin to stop date, allow to dc - May require drainage of fluid but will monitor for now since clinically patient is showing no signs of being septic/toxic.  ENDOCRINE  Lab 12/23/12 0335 12/22/12 2337 12/22/12 1954 12/22/12 1612 12/22/12 1228  GLUCAP 130* 157* 135* 139* 174*   A:   1) DM P:   - Novolog sliding scale. (ICU protocol) -cbg trending down  NEUROLOGIC  A:   1) Altered mental status likely secondary to hypoxia and metabolic acidosis.  Acute metabolic encephalopathy. cva vs met per neuro P:   - CT scan of the head with no acute stroke. - MRI of the brain for stroke vs met reviewed - EEG diffuse slowness but the physical exam findings  are entirely too focal for patient to have a metabolic/toxic encephalpathy.  May require comfort care in future.  Clarify goals of care. Extubation with re intubation? Meet with family 1-6 but they are out of town and want conference call if possible. He may be extubatable but underlying stage 4 lung ca is a major factor.  Brett Canales Minor ACNP Adolph Pollack PCCM Pager [phone] till 3 pm If no answer page [phone] 12/23/2012, 7:54 AM  I have fully examined this patient and agree with above findings.    And edited infull;  Ccm time 30 min  Mcarthur Rossetti. Tyson Alias, MD, FACP Pgr: [phone] [facility]

## 2012-12-23 NOTE — Progress Notes (Signed)
eLink Physician-Brief Progress Note Patient Name: Johnathan Bowman DOB: 1929/08/02 MRN: [id number]  Date of Service  12/23/2012   HPI/Events of Note   hypokalemia  eICU Interventions  Potassium replaced   Intervention Category Intermediate Interventions: Electrolyte abnormality - evaluation and management  DETERDING,ELIZABETH 12/23/2012, 4:20 AM

## 2012-12-24 ENCOUNTER — Other Ambulatory Visit: Payer: Medicare Other | Admitting: Lab

## 2012-12-24 ENCOUNTER — Ambulatory Visit (HOSPITAL_COMMUNITY)
Admission: RE | Admit: 2012-12-24 | Discharge: 2012-12-24 | Payer: Medicare Other | Source: Ambulatory Visit | Attending: Internal Medicine | Admitting: Internal Medicine

## 2012-12-24 ENCOUNTER — Inpatient Hospital Stay (HOSPITAL_COMMUNITY): Payer: Medicare Other

## 2012-12-24 LAB — BASIC METABOLIC PANEL
BUN: 28 mg/dL — ABNORMAL HIGH (ref 6–23)
Calcium: 9.4 mg/dL (ref 8.4–10.5)
GFR calc non Af Amer: 66 mL/min — ABNORMAL LOW (ref >90)
Glucose, Bld: 203 mg/dL — ABNORMAL HIGH (ref 70–99)
Sodium: 138 mEq/L (ref 135–145)

## 2012-12-24 LAB — GLUCOSE, CAPILLARY
Glucose-Capillary: 183 mg/dL — ABNORMAL HIGH (ref 70–99)
Glucose-Capillary: 142 mg/dL — ABNORMAL HIGH (ref 70–99)

## 2012-12-24 LAB — CBC
HCT: 33.6 % — ABNORMAL LOW (ref 39.0–52.0)
Hemoglobin: 10.2 g/dL — ABNORMAL LOW (ref 13.0–17.0)
MCH: 26.3 pg (ref 26.0–34.0)
MCHC: 30.4 g/dL (ref 30.0–36.0)
RDW: 14.5 % (ref 11.5–15.5)

## 2012-12-24 MED ORDER — HEPARIN SOD (PORK) LOCK FLUSH 100 UNIT/ML IV SOLN
500.0000 [IU] | INTRAVENOUS | Status: DC
  Administered 2012-12-24: 500 [IU]

## 2012-12-24 MED ORDER — HEPARIN SOD (PORK) LOCK FLUSH 100 UNIT/ML IV SOLN
500.0000 [IU] | INTRAVENOUS | Status: DC | PRN
  Filled 2012-12-24: qty 5

## 2012-12-24 MED ORDER — FUROSEMIDE 10 MG/ML IJ SOLN
40.0000 mg | Freq: Two times a day (BID) | INTRAMUSCULAR | Status: DC
  Administered 2012-12-24 – 2012-12-27 (×7): 40 mg via INTRAVENOUS
  Filled 2012-12-24 (×8): qty 4

## 2012-12-24 NOTE — Progress Notes (Signed)
per family, pt previously denied trach/peg and wrote on paper last week that he wanted to die. MD spoke with pt's wife and daughter at bedside. Pt unable to write at this time and shows frustration. answered "yes" when asked if birds swam in the ocean.wife and one daughter in agreement that pt would not want life sustaining measures such as trach/peg, vent. will continue to assess family for readiness to make decisions.

## 2012-12-24 NOTE — Progress Notes (Signed)
Name: Johnathan Bowman MRN: [id number] DOB: September 05, 1929    LOS: 9  PULMONARY / CRITICAL CARE MEDICINE  HPI:   79 year old male with PMH relevant for HTN, DM, dyslipidemia, GERD, CAD, DVT. Diagnosed with metastatic (stage IV) non small cell lung cancer in November 2011 (LUL). Treated since then with chemotherapy. Last cycle given in October 2013. He has history of a left sided malignant pleural effusion (cytology from 01/05/12) and had a Pleurex catheter placed in the past. Being managed for vdrf met brain vs infarct.  Vital Signs: Temp:  [97.7 F (36.5 C)-100.4 F (38 C)] 100.2 F (37.9 C) (01/06 0900) Pulse Rate:  [42-111] 111  (01/06 0900) Resp:  [10-21] 18  (01/06 0900) BP: (110-153)/(55-83) 143/67 mmHg (01/06 0800) SpO2:  [89 %-100 %] 100 % (01/06 0900) FiO2 (%):  [39.8 %-40.2 %] 40.1 % (01/06 0900) Weight:  [80 kg (176 lb 5.9 oz)] 80 kg (176 lb 5.9 oz) (01/06 0600)  Physical Examination: General:  intubated, sedated this AM, but agitated when sedation turned off. Neuro: awake, non-focal. wds to stimuli.  follows commands, including weak cough HEENT:  PERRL, pink conjunctivae, moist membranes Neck:  Supple, no JVD  , left IJ cvl Cardiovascular:  RRR, no M/R/G Lungs:  Bilateral diffuse crackles, no wheezing Abdomen:  Soft, nontender, nondistended, bowel sounds present, tf at goal Musculoskeletal:  Moves all extremities, no pedal edema Skin:  No rash.  ASSESSMENT AND PLAN  PULMONARY  Lab 12/22/12 0425 12/20/12 1247 12/20/12 0059 12/19/12 0500 12/18/12 0420  PHART 7.410 7.458* 7.419 7.465* 7.443  PCO2ART 36.8 35.9 45.4* 39.3 40.0  PO2ART 104.0* 292.0* 66.1* 83.4 71.8*  HCO3 22.9 25.0* 28.9* 27.8* 27.0*  O2SAT 97.3 99.9 92.4 95.8 92.6   Ventilator Settings: Vent Mode:  [-] PSV FiO2 (%):  [39.8 %-40.2 %] 40.1 % Set Rate:  [16 bmp] 16 bmp Vt Set:  [600 mL] 600 mL PEEP:  [5 cmH20] 5 cmH20 Pressure Support:  [10 cmH20] 10 cmH20 Plateau Pressure:  [22 cmH20-26 cmH20] 22  cmH20     ETT:  12/28>>>1/1 1/2 reintubated>>  A:   1) Health care associated pneumonia 2) Acute on chronic respiratory failure; failed extubation presumably largely due to poor airway protection, possible CVA 3) Loculated malignant left pleural effusion P:   - May need Pleurex catheter replacement but will need more discussion with family regarding plan of care since patient does not wish for trach/peg. - Albuterol/ipratropium PRN. - Short term intubation only no trach/peg. - continue SBT's and weaning PS; suspect we will need to discuss w family extubation with no plans for reintubation -consider neg balance further  CARDIOVASCULAR No results found for this basename: TROPONINI:5,LATICACIDVEN:5, O2SATVEN:5,PROBNP:5 in the last 168 hours ECG:  Sinus tachycardia Lines: Left port-A-cath. Peripheral IV's, left i j cvl  A:  Hemodynamically stable after transient hypotension due to sedation. P:  - D5 1/2 NS at kvo  RENAL  BMET Lab Results  Component Value Date   CREATININE 1.03 12/24/2012   BUN 28* 12/24/2012   NA 138 12/24/2012   K 3.9 12/24/2012   CL 102 12/24/2012   CO2 26 12/24/2012   Foley:  12/15/12  Intake/Output Summary (Last 24 hours) at 12/24/12 0918 Last data filed at 12/24/12 0900  Gross per 24 hour  Intake 1665.25 ml  Output   2055 ml  Net -389.75 ml   A:   1) Normal kidney function. P:   Lasix to neg balance needed, not successful last 24  hrs Follow chemistries   Replete K+ as needed  GASTROINTESTINAL No results found for this basename: AST:5,ALT:5,ALKPHOS:5,BILITOT:5,PROT:5,ALBUMIN:5 in the last 168 hours A:   Constipation.  Nutrition P:   - GI prophylaxis with protonix. - Restarted TF. - last BM, none since 1/1, add dulc supp 1/5  HEMATOLOGIC  Lab 12/24/12 0445 12/23/12 0330 12/22/12 0453 12/21/12 0415 12/20/12 0805  HGB 10.2* 9.8* 9.9* 10.2* 11.4*  HCT 33.6* 31.9* 31.7* 33.6* 37.6*  PLT 273 245 247 287 333  INR -- -- -- -- --  APTT -- -- -- --  --   A:   1) Anemia likely due to chronic disease P:  -  follow CBC -  in setting cancer, Lovenox for DVT prophy  INFECTIOUS  Lab 12/24/12 0445 12/23/12 0330 12/22/12 0453 12/21/12 0415 12/20/12 0805  WBC 5.6 6.0 6.3 5.6 7.6  PROCALCITON -- -- -- -- --   Cultures: Blood 12/28>>>Neg Urine 12/28>>>Neg Sputum 12/28>>>Moderate yeast Antibiotics: Cefepime 12/28>>>12/31 Vancomycin 12/28>>>12/31 Rocephin 12/31>>>1/5  A:   1) Health care associated pneumonia P:   - abx completed - May require drainage of pleural fluid but will monitor for now since clinically patient is showing no signs of being septic/toxic.  ENDOCRINE  Lab 12/24/12 0812 12/24/12 0454 12/24/12 0017 12/23/12 2003 12/23/12 1653  GLUCAP 146* 191* 169* 143* 192*   A:   1) DM P:   - Novolog sliding scale. (ICU protocol)  NEUROLOGIC  A:   1) Altered mental status likely secondary to hypoxia and metabolic acidosis.  Acute metabolic encephalopathy. MRI of the brain 1/3 >> probable scattered ischemic changes although less likely considerations are metastatic disease, cerebritis - EEG diffuse slowness but the physical exam findings  are entirely too focal for patient to have a metabolic/toxic encephalpathy.  May require comfort care. Clarify goals of care. Do not believe he will successfully extubate given his poor airway protection. He does not want trach. Spoke to daughter at bedside 1/6, will speak to rest of family regarding possible withdrawal of care. Consider extubation for success but prepare for comfort if he fails. Family would like to conference call, will try to arrange.   CC time 50 minutes  Levy Pupa, MD, PhD 12/24/2012, 9:27 AM [facility] [phone] or if no answer [phone]

## 2012-12-24 NOTE — Progress Notes (Signed)
SLP Cancellation Note  Patient Details Name: Johnathan Bowman MRN: [id number] DOB: 03-06-1929   Cancelled treatment:       Reason Eval/Treat Not Completed: Medical issues which prohibited therapy. Pt remains intubated. SLP will sign off at this time.    Georgia Baria, Riley Nearing 12/24/2012, 7:59 AM

## 2012-12-24 NOTE — Clinical Social Work Note (Signed)
Clinical Social Worker receieved referral regarding the need for family conference arrangements.  MD was able to speak with patient wife and daughter at the bedside today and is willing to further discuss patient needs with patient other daughter who lives out of state - name and number on chart.    Per RN note:  "per family, pt previously denied trach/peg and wrote on paper last week that he wanted to die. MD spoke with pt's wife and daughter at bedside. Pt unable to write at this time and shows frustration. answered "yes" when asked if birds swam in the ocean.wife and one daughter in agreement that pt would not want life sustaining measures such as trach/peg, vent. will continue to assess family for readiness to make decisions."  Clinical Social Worker will remain involved for support and attempt to meet with patient family to complete full assessment if needed.  Patient family seems realistic with patient needs, just needing time to further discuss options at this time.  CSW available for support as needed.  Macario Golds, [place] [phone]

## 2012-12-25 ENCOUNTER — Inpatient Hospital Stay (HOSPITAL_COMMUNITY): Payer: Medicare Other

## 2012-12-25 LAB — BASIC METABOLIC PANEL
GFR calc Af Amer: 66 mL/min — ABNORMAL LOW (ref >90)
GFR calc non Af Amer: 57 mL/min — ABNORMAL LOW (ref >90)
Potassium: 3.5 mEq/L (ref 3.5–5.1)
Sodium: 140 mEq/L (ref 135–145)

## 2012-12-25 LAB — GLUCOSE, CAPILLARY
Glucose-Capillary: 151 mg/dL — ABNORMAL HIGH (ref 70–99)
Glucose-Capillary: 185 mg/dL — ABNORMAL HIGH (ref 70–99)
Glucose-Capillary: 188 mg/dL — ABNORMAL HIGH (ref 70–99)
Glucose-Capillary: 198 mg/dL — ABNORMAL HIGH (ref 70–99)
Glucose-Capillary: 205 mg/dL — ABNORMAL HIGH (ref 70–99)

## 2012-12-25 LAB — CBC
Hemoglobin: 10.3 g/dL — ABNORMAL LOW (ref 13.0–17.0)
RBC: 3.86 MIL/uL — ABNORMAL LOW (ref 4.22–5.81)
WBC: 6.9 10*3/uL (ref 4.0–10.5)

## 2012-12-25 MED ORDER — SODIUM CHLORIDE 0.9 % IV SOLN
INTRAVENOUS | Status: DC
  Administered 2012-12-25 – 2012-12-27 (×2): via INTRAVENOUS

## 2012-12-25 MED ORDER — INSULIN ASPART 100 UNIT/ML ~~LOC~~ SOLN
0.0000 [IU] | SUBCUTANEOUS | Status: DC
  Administered 2012-12-25 – 2012-12-26 (×6): 4 [IU] via SUBCUTANEOUS
  Administered 2012-12-26: 3 [IU] via SUBCUTANEOUS
  Administered 2012-12-26: 7 [IU] via SUBCUTANEOUS
  Administered 2012-12-26: 4 [IU] via SUBCUTANEOUS
  Administered 2012-12-26 – 2012-12-27 (×3): 3 [IU] via SUBCUTANEOUS

## 2012-12-25 NOTE — Progress Notes (Signed)
Called patient's daughter Wynn Banker [phone] but was unable to reach her. Left a message that I would attempt to contact her, that she should call the ICU for update and to synchronize time for [place] to speak.   Levy Pupa, MD, PhD 12/25/2012, 12:19 PM [facility] [phone] or if no answer [phone]

## 2012-12-25 NOTE — Progress Notes (Signed)
Name: Johnathan Bowman MRN: [id number] DOB: [birth year]    LOS: 10  PULMONARY / CRITICAL CARE MEDICINE  HPI:   79 year old male with PMH relevant for HTN, DM, dyslipidemia, GERD, CAD, DVT. Diagnosed with metastatic (stage IV) non small cell lung cancer in November 2011 (LUL). Treated since then with chemotherapy. Last cycle given in October 2013. He has history of a left sided malignant pleural effusion (cytology from 01/05/12) and had a Pleurex catheter placed in the past. Being managed for vdrf met brain vs infarct.  INTERVAL:  Pt tolerates PSV, but continues to have apparent tongue deviation, large tongue.  Spoke with daughter and wife 1/6; both acknowledge that he has not wanted aggressive care, has not wanted trach/PEG.   Vital Signs: Temp:  [98.1 F (36.7 C)-100.8 F (38.2 C)] 100.2 F (37.9 C) (01/07 1000) Pulse Rate:  [51-123] 109  (01/07 1000) Resp:  [16-22] 16  (01/07 1000) BP: (108-172)/(59-86) 129/63 mmHg (01/07 1000) SpO2:  [91 %-100 %] 98 % (01/07 1000) FiO2 (%):  [39.7 %-40.3 %] 39.9 % (01/07 1000) Weight:  [81.6 kg (179 lb 14.3 oz)] 81.6 kg (179 lb 14.3 oz) (01/07 0400)  Physical Examination: General:  intubated, ill appearing, awake Neuro: awake, non-focal. wds to stimuli.  follows commands, including weak cough HEENT:  PERRL, pink conjunctivae, large tongue with apparent deviation and impaired movement Neck:  Supple, no JVD  , left IJ cvl Cardiovascular:  RRR, no M/R/G Lungs:  Bilateral diffuse crackles, no wheezing, heavy secretions Abdomen:  Soft, nontender, nondistended, bowel sounds present, tf at goal Musculoskeletal:  Moves all extremities, no pedal edema Skin:  No rash.  ASSESSMENT AND PLAN  PULMONARY  Lab 12/22/12 0425 12/20/12 1247 12/20/12 0059 12/19/12 0500  PHART 7.410 7.458* 7.419 7.465*  PCO2ART 36.8 35.9 45.4* 39.3  PO2ART 104.0* 292.0* 66.1* 83.4  HCO3 22.9 25.0* 28.9* 27.8*  O2SAT 97.3 99.9 92.4 95.8   Ventilator Settings: Vent Mode:   [-] PRVC FiO2 (%):  [39.7 %-40.3 %] 39.9 % Set Rate:  [16 bmp] 16 bmp Vt Set:  [600 mL] 600 mL PEEP:  [5 cmH20] 5 cmH20 Plateau Pressure:  [18 cmH20-22 cmH20] 21 cmH20     ETT:  12/28>>>1/1 1/2 reintubated>>  A:   1) Health care associated pneumonia 2) Acute on chronic respiratory failure; failed extubation presumably largely due to poor airway protection, possible CVA 3) Loculated malignant left pleural effusion (hx pleurodesis) P:   - will need more discussion with family regarding plan of care since patient does not wish for trach/peg. It would appear that he failed in large part due to poor airway protection; trach might address this. Not clear what the family wants for him, and whether this corresponds with what the patient wants under these circumstances. I have discussed extubation with plan to make him comfortable if he fails. The wife and local daughter would like for me to speak to other children out of town by phone.  - May need Pleurex catheter replacement at some point depending on goals of care; may be low utility since he has had pleurodesis - Albuterol/ipratropium PRN. - continue SBT's and weaning PS but no extubation until we clarify whether we would reintubate  CARDIOVASCULAR No results found for this basename: TROPONINI:5,LATICACIDVEN:5, O2SATVEN:5,PROBNP:5 in the last 168 hours ECG:  Sinus tachycardia Lines: Left port-A-cath. Peripheral IV's, left i j cvl  A:  Hemodynamically stable after transient hypotension due to sedation. P:  - change [facility]F to NS at [facility]  RENAL  BMET Lab Results  Component Value Date   CREATININE 1.16 12/25/2012   BUN 36* 12/25/2012   NA 140 12/25/2012   K 3.5 12/25/2012   CL 101 12/25/2012   CO2 27 12/25/2012   Foley:  12/15/12  Intake/Output Summary (Last 24 hours) at 12/25/12 1112 Last data filed at 12/25/12 1100  Gross per 24 hour  Intake 1624.95 ml  Output   1981 ml  Net -356.05 ml   A:   1) Normal kidney function. P:   Lasix to  neg balance, currently at 40mg  q12h Follow chemistries   Replete K+ as needed  GASTROINTESTINAL No results found for this basename: AST:5,ALT:5,ALKPHOS:5,BILITOT:5,PROT:5,ALBUMIN:5 in the last 168 hours A:   Constipation.  Nutrition P:   - GI prophylaxis with protonix. - Restarted TF. - duccolax prn  HEMATOLOGIC  Lab 12/25/12 0335 12/24/12 0445 12/23/12 0330 12/22/12 0453 12/21/12 0415  HGB 10.3* 10.2* 9.8* 9.9* 10.2*  HCT 33.2* 33.6* 31.9* 31.7* 33.6*  PLT 284 273 245 247 287  INR -- -- -- -- --  APTT -- -- -- -- --   A:   1) Anemia likely due to chronic disease P:  -  follow CBC -  in setting cancer, Lovenox for DVT prophy  INFECTIOUS  Lab 12/25/12 0335 12/24/12 0445 12/23/12 0330 12/22/12 0453 12/21/12 0415  WBC 6.9 5.6 6.0 6.3 5.6  PROCALCITON -- -- -- -- --   Cultures: Blood 12/28>>>Neg Urine 12/28>>>Neg Sputum 12/28>>>Moderate yeast Antibiotics: Cefepime 12/28>>>12/31 Vancomycin 12/28>>>12/31 Rocephin 12/31>>>1/5  A:   1) Health care associated pneumonia P:   - abx completed - May require drainage of pleural fluid but will monitor for now since clinically patient is showing no signs of being septic/toxic.  ENDOCRINE  Lab 12/25/12 0838 12/25/12 0327 12/24/12 2345 12/24/12 2016 12/24/12 1653  GLUCAP 204* 205* 151* 142* 183*   A:   1) DM P:   - change to standard SSI protocol on TF's  NEUROLOGIC  A:   1) Altered mental status likely secondary to hypoxia and metabolic acidosis.  Acute metabolic encephalopathy. MRI of the brain 1/3 >> probable scattered ischemic changes although less likely considerations are metastatic disease, cerebritis. More awake, but still concerned about residual deficits and ability to protect airway - EEG diffuse slowness but the physical exam findings  are entirely too focal for patient to have a metabolic/toxic encephalpathy.  May require comfort care. Will continue to clarify goals of care. Doubt he will successfully  extubate given his poor airway protection. He has not wanted trach. Spoke to daughter at bedside 1/6, will speak to rest of family regarding possible withdrawal of care. Consider extubation for success but prepare for comfort if he fails.    CC time 50 minutes  Levy Pupa, MD, PhD 12/25/2012, 11:12 AM [facility] [phone] or if no answer [phone]

## 2012-12-26 ENCOUNTER — Ambulatory Visit: Payer: Medicare Other | Admitting: Family

## 2012-12-26 ENCOUNTER — Telehealth: Payer: Self-pay | Admitting: Family

## 2012-12-26 ENCOUNTER — Ambulatory Visit: Payer: Medicare Other | Admitting: Internal Medicine

## 2012-12-26 ENCOUNTER — Inpatient Hospital Stay (HOSPITAL_COMMUNITY): Payer: Medicare Other

## 2012-12-26 LAB — GLUCOSE, CAPILLARY
Glucose-Capillary: 205 mg/dL — ABNORMAL HIGH (ref 70–99)
Glucose-Capillary: 178 mg/dL — ABNORMAL HIGH (ref 70–99)

## 2012-12-26 LAB — COMPREHENSIVE METABOLIC PANEL
ALT: 56 U/L — ABNORMAL HIGH (ref 0–53)
Albumin: 2.8 g/dL — ABNORMAL LOW (ref 3.5–5.2)
Alkaline Phosphatase: 107 U/L (ref 39–117)
Potassium: 3.3 mEq/L — ABNORMAL LOW (ref 3.5–5.1)
Sodium: 140 mEq/L (ref 135–145)
Total Protein: 6.8 g/dL (ref 6.0–8.3)

## 2012-12-26 LAB — CBC
Hemoglobin: 10.1 g/dL — ABNORMAL LOW (ref 13.0–17.0)
MCH: 26.2 pg (ref 26.0–34.0)
MCV: 86 fL (ref 78.0–100.0)
RBC: 3.85 MIL/uL — ABNORMAL LOW (ref 4.22–5.81)

## 2012-12-26 LAB — MAGNESIUM: Magnesium: 1.9 mg/dL (ref 1.5–2.5)

## 2012-12-26 MED ORDER — POTASSIUM CHLORIDE 20 MEQ/15ML (10%) PO LIQD
ORAL | Status: AC
  Administered 2012-12-26: 40 meq
  Filled 2012-12-26: qty 30

## 2012-12-26 MED ORDER — POTASSIUM CHLORIDE 20 MEQ/15ML (10%) PO LIQD
40.0000 meq | Freq: Once | ORAL | Status: AC
  Administered 2012-12-26: 40 meq
  Filled 2012-12-26: qty 30

## 2012-12-26 NOTE — Progress Notes (Signed)
Attempted to place a NG tube. Pt kept vomiting and I was unable to place NG tube. Notified Dr. Delton Coombes and he said to just wait for now and we would reassess tomorrow. Will continue to monitor.

## 2012-12-26 NOTE — Progress Notes (Signed)
Name: Johnathan Bowman MRN: [id number] DOB: [birth year]    LOS: 11  PULMONARY / CRITICAL CARE MEDICINE  HPI:   79 year old male with PMH relevant for HTN, DM, dyslipidemia, GERD, CAD, DVT. Diagnosed with metastatic (stage IV) non small cell lung cancer in November 2011 (LUL). Treated since then with chemotherapy. Last cycle given in October 2013. He has history of a left sided malignant pleural effusion (cytology from 01/05/12) and had a Pleurex catheter placed in the past. Being managed for vdrf met brain vs infarct.  INTERVAL:  Pt tolerating some PSV, but has periods of apnea Interacts and follows commands Called his daughter Jacki Cones 1/7, left message. His other daughter is at bedside this am  Vital Signs: Temp:  [96.8 F (36 C)-100.2 F (37.9 C)] 98.4 F (36.9 C) (01/08 0900) Pulse Rate:  [50-109] 104  (01/08 0900) Resp:  [15-21] 18  (01/08 0900) BP: (111-144)/(59-100) 144/64 mmHg (01/08 0900) SpO2:  [97 %-100 %] 98 % (01/08 0900) FiO2 (%):  [39.8 %-40.3 %] 40 % (01/08 0900) Weight:  [81.5 kg (179 lb 10.8 oz)] 81.5 kg (179 lb 10.8 oz) (01/08 0451)  Physical Examination: General:  intubated, ill appearing, awake Neuro: awake, non-focal. wds to stimuli.  follows commands, stronger cough 1/8 HEENT:  PERRL, pink conjunctivae, large tongue with apparent deviation and impaired movement, stronger cough 1/8 Neck:  Supple, no JVD  , left IJ cvl Cardiovascular:  RRR, no M/R/G Lungs:  Bilateral diffuse crackles, no wheezing, heavy secretions Abdomen:  Soft, nontender, nondistended, bowel sounds present, tf at goal Musculoskeletal:  Moves all extremities, no pedal edema Skin:  No rash.  ASSESSMENT AND PLAN  PULMONARY  Lab 12/22/12 0425 12/20/12 1247 12/20/12 0059  PHART 7.410 7.458* 7.419  PCO2ART 36.8 35.9 45.4*  PO2ART 104.0* 292.0* 66.1*  HCO3 22.9 25.0* 28.9*  O2SAT 97.3 99.9 92.4   Ventilator Settings: Vent Mode:  [-] PRVC FiO2 (%):  [39.8 %-40.3 %] 40 % Set Rate:  [16 bmp]  16 bmp Vt Set:  [600 mL] 600 mL PEEP:  [5 cmH20] 5 cmH20 Plateau Pressure:  [21 cmH20-23 cmH20] 21 cmH20     ETT:  12/28>>>1/1 1/2 reintubated>>  A:   1) Health care associated pneumonia 2) Acute on chronic respiratory failure; failed extubation presumably largely due to poor airway protection, possible CVA 3) Loculated malignant left pleural effusion (hx pleurodesis) P:   - will need more discussion with family regarding plan of care since patient does not wish for trach/peg. It would appear that he failed in large part due to poor airway protection; trach might address this. Discussed status and plans with daughter this am 1/8. I favor extubation with hope for success but no plans for reintubation if airway protection is poor. She agrees, wants to involve his wife in the decision also. Will discuss with them both today 1/8.  - could consider Pleurex catheter replacement at some point depending on goals of care; may be low utility since he has had pleurodesis and given overall clinical picture - Albuterol/ipratropium PRN.  CARDIOVASCULAR No results found for this basename: TROPONINI:5,LATICACIDVEN:5, O2SATVEN:5,PROBNP:5 in the last 168 hours ECG:  Sinus tachycardia Lines: Left port-A-cath. Peripheral IV's, left i j cvl  A:  Hemodynamically stable after transient hypotension due to sedation. P:  - [facility]F at NS [facility]  RENAL  BMET Lab Results  Component Value Date   CREATININE 1.14 12/26/2012   BUN 45* 12/26/2012   NA 140 12/26/2012   K 3.3*  12/26/2012   CL 99 12/26/2012   CO2 28 12/26/2012   Foley:  12/15/12  Intake/Output Summary (Last 24 hours) at 12/26/12 0925 Last data filed at 12/26/12 0920  Gross per 24 hour  Intake 1945.98 ml  Output   2131 ml  Net -185.02 ml   A:   1) Normal kidney function. P:   Lasix to neg balance, currently at 40mg  q12h Follow chemistries   Replete K+ as needed  GASTROINTESTINAL  Lab 12/26/12 0445  AST 24  ALT 56*  ALKPHOS 107  BILITOT 0.2*    PROT 6.8  ALBUMIN 2.8*   A:   Constipation.  Nutrition P:   - GI prophylaxis with protonix. - Restarted TF. - duccolax prn  HEMATOLOGIC  Lab 12/26/12 0445 12/25/12 0335 12/24/12 0445 12/23/12 0330 12/22/12 0453  HGB 10.1* 10.3* 10.2* 9.8* 9.9*  HCT 33.1* 33.2* 33.6* 31.9* 31.7*  PLT 291 284 273 245 247  INR -- -- -- -- --  APTT -- -- -- -- --   A:   1) Anemia likely due to chronic disease P:  -  follow CBC -  in setting cancer, Lovenox for DVT prophy  INFECTIOUS  Lab 12/26/12 0445 12/25/12 0335 12/24/12 0445 12/23/12 0330 12/22/12 0453  WBC 7.2 6.9 5.6 6.0 6.3  PROCALCITON -- -- -- -- --   Cultures: Blood 12/28>>>Neg Urine 12/28>>>Neg Sputum 12/28>>>Moderate yeast Antibiotics: Cefepime 12/28>>>12/31 Vancomycin 12/28>>>12/31 Rocephin 12/31>>>1/5  A:   1) Health care associated pneumonia P:   - abx completed - May require drainage of pleural fluid but will monitor for now since clinically patient is showing no signs of being septic/toxic.  ENDOCRINE  Lab 12/26/12 0805 12/26/12 0407 12/25/12 2354 12/25/12 2028 12/25/12 1633  GLUCAP 178* 205* 188* 198* 185*   A:   1) DM P:   - changed to standard SSI protocol on TF's  NEUROLOGIC  A:   1) Altered mental status likely secondary to hypoxia and metabolic acidosis.  Acute metabolic encephalopathy. MRI of the brain 1/3 >> probable scattered ischemic changes vs metastatic disease, cerebritis. More awake, but still concerned about residual deficits and ability to protect airway - EEG diffuse slowness but the physical exam findings  are entirely too focal for patient to have a metabolic/toxic encephalpathy.  May require comfort care. Doubt he will successfully extubate given his poor airway protection. He has not wanted trach. Spoke to daughter at bedside 1/8, will speak to rest of family regarding possible withdrawal of care. Believe best plan will be extubation for success but prepare for comfort if he fails.     CC time 50 minutes  Levy Pupa, MD, PhD 12/26/2012, 9:25 AM [facility] [phone] or if no answer [phone]

## 2012-12-26 NOTE — Progress Notes (Signed)
New orders received. Patient familiar to this SLP from initial bedside exam this admission. Plan to hold bedside swallow eval until 1/9 given prolonged intubation and possibility to failure to protect airway after extubation as noted in MD progress note.   Ferdinand Lango MA, CCC-SLP [phone]

## 2012-12-26 NOTE — Progress Notes (Addendum)
NUTRITION FOLLOW UP  Intervention:   Supplement diet once diet advanced.   Nutrition Dx:   Inadequate oral intake related to inability to eat as evidenced by NPO status; ongoing.  Goal:   Pt to  meet >/= 90% of their estimated nutrition needs; not met.   Monitor:   Swallow evaluation, ability to start PO diet  Assessment:   Patient extubated today, swallow eval ordered. Per SLP she will wait to evaluate pt until tomorrow.   Pt awake and alert in the bed. Family at bedside.  Pt discussed during ICU rounds and with RN.   Patient with hx of metastatic (stage IV) non small cell lung cancer; last cycle of chemo in October '13  Height: Ht Readings from Last 1 Encounters:  12/15/12 5\' 7"  (1.702 m)    Weight Status:   Wt Readings from Last 1 Encounters:  12/26/12 179 lb 10.8 oz (81.5 kg)    Re-estimated needs:  Kcal: 1700-1900 Protein: 97-110 grams Fluid: >1.7 L/day  Skin: abdominal blister  Diet Order: NPO   Intake/Output Summary (Last 24 hours) at 12/26/12 1204 Last data filed at 12/26/12 1000  Gross per 24 hour  Intake 1770.98 ml  Output   1980 ml  Net -209.02 ml   Last BM: 1/7  Labs:   Lab 12/26/12 0445 12/25/12 0335 12/24/12 0445 12/22/12 0453 12/21/12 0415  NA 140 140 138 -- --  K 3.3* 3.5 3.9 -- --  CL 99 101 102 -- --  CO2 28 27 26  -- --  BUN 45* 36* 28* -- --  CREATININE 1.14 1.16 1.03 -- --  CALCIUM 9.5 9.6 9.4 -- --  MG 1.9 -- -- 2.0 2.0  PHOS 4.2 -- -- 3.3 2.7  GLUCOSE 208* 221* 203* -- --    CBG (last 3)   Basename 12/26/12 1146 12/26/12 0805 12/26/12 0407  GLUCAP 173* 178* 205*    Scheduled Meds:    . antiseptic oral rinse  15 mL Mouth Rinse QID  . aspirin  325 mg Oral Daily  . chlorhexidine  15 mL Mouth Rinse BID  . enoxaparin (LOVENOX) injection  40 mg Subcutaneous Q24H  . feeding supplement  30 mL Per Tube TID  . furosemide  40 mg Intravenous Q12H  . heparin lock flush  500 Units Intracatheter Q30 days  . insulin aspart  0-20  Units Subcutaneous Q4H  . multivitamin  5 mL Per Tube Daily  . pantoprazole (PROTONIX) IV  40 mg Intravenous Q24H    Continuous Infusions:    . sodium chloride 20 mL/hr at 12/26/12 1000  . feeding supplement (JEVITY 1.2 CAL) Stopped (12/26/12 1000)  . fentaNYL infusion INTRAVENOUS Stopped (12/26/12 0850)  . midazolam (VERSED) infusion Stopped (12/26/12 0850)    Kendell Bane RD, LDN, CNSC [phone] Pager [phone] After Hours Pager

## 2012-12-26 NOTE — Progress Notes (Signed)
Inpatient Diabetes Program Recommendations  AACE/ADA: New Consensus Statement on Inpatient Glycemic Control (2013)  Target Ranges:  Prepandial:   less than 140 mg/dL      Peak postprandial:   less than 180 mg/dL (1-2 hours)      Critically ill patients:  140 - 180 mg/dL    Results for LORD, LANCOUR (MRN [id number]) as of 12/26/2012 09:56  Ref. Range 12/24/2012 23:45 12/25/2012 03:27 12/25/2012 08:38 12/25/2012 12:42 12/25/2012 16:33 12/25/2012 20:28  Glucose-Capillary Latest Range: 70-99 mg/dL 782 (H) 956 (H) 213 (H) 189 (H) 185 (H) 198 (H)   Results for DAINEL, ARCIDIACONO (MRN [id number]) as of 12/26/2012 09:56  Ref. Range 12/25/2012 23:54 12/26/2012 04:07 12/26/2012 08:05  Glucose-Capillary Latest Range: 70-99 mg/dL 629 (H) 528 (H) 413 (H)    Noted MD to speak with family regarding long-term plans for patient.  May require comfort care at some point.  For now, CBGs above goal.  If within goals of care, may want to add Novolog tube feed coverage to help with glucose elevations.  Recommend the following: 1. Add Novolog 3 units Q4 hours.  Note: Will follow. Ambrose Finland RN, MSN, CDE Diabetes Coordinator Inpatient Diabetes Program [phone]

## 2012-12-26 NOTE — Progress Notes (Signed)
eLink Physician-Brief Progress Note Patient Name: Johnathan Bowman DOB: July 13, 1929 MRN: [id number]  Date of Service  12/26/2012   HPI/Events of Note   Hypokalemia  eICU Interventions  Potassium replaced   Intervention Category Minor Interventions: Electrolytes abnormality - evaluation and management  Riki Gehring 12/26/2012, 5:39 AM

## 2012-12-26 NOTE — Telephone Encounter (Signed)
Patient has not shown for appointment today 12/26/12 - called all numbers listed for the patient, none of them work. Left message for daughter Johnathan Bowman to call me regarding her father's missed appointment.

## 2012-12-26 NOTE — Progress Notes (Signed)
UR completed 

## 2012-12-26 NOTE — Progress Notes (Signed)
PCCM Progress Note  Discussed clinical status and goals for care with patient, wife, daughter, son-in-law at bedside. All agree that he does not want prolonged ventilation and would not want trach. Plan will be for extubation today with goal for success but understanding that he may fail. If he fails, then we will transition to comfort care. Airway protection will be a limiting issue. I will place the extubation orders now, as well as DNR/I orders.   Levy Pupa, MD, PhD 12/26/2012, 11:00 AM [facility] [phone] or if no answer [phone]

## 2012-12-27 ENCOUNTER — Inpatient Hospital Stay (HOSPITAL_COMMUNITY): Payer: Medicare Other

## 2012-12-27 DIAGNOSIS — J9600 Acute respiratory failure, unspecified whether with hypoxia or hypercapnia: Secondary | ICD-10-CM | POA: Diagnosis present

## 2012-12-27 DIAGNOSIS — J449 Chronic obstructive pulmonary disease, unspecified: Secondary | ICD-10-CM | POA: Diagnosis present

## 2012-12-27 DIAGNOSIS — C3490 Malignant neoplasm of unspecified part of unspecified bronchus or lung: Secondary | ICD-10-CM | POA: Diagnosis present

## 2012-12-27 DIAGNOSIS — R4701 Aphasia: Secondary | ICD-10-CM | POA: Diagnosis present

## 2012-12-27 DIAGNOSIS — I639 Cerebral infarction, unspecified: Secondary | ICD-10-CM | POA: Diagnosis present

## 2012-12-27 LAB — CBC
HCT: 38.4 % — ABNORMAL LOW (ref 39.0–52.0)
Platelets: 351 10*3/uL (ref 150–400)
RDW: 14.1 % (ref 11.5–15.5)
WBC: 10.9 10*3/uL — ABNORMAL HIGH (ref 4.0–10.5)

## 2012-12-27 LAB — BASIC METABOLIC PANEL
Chloride: 103 mEq/L (ref 96–112)
Creatinine, Ser: 1.1 mg/dL (ref 0.50–1.35)
GFR calc Af Amer: 71 mL/min — ABNORMAL LOW (ref >90)

## 2012-12-27 LAB — GLUCOSE, CAPILLARY
Glucose-Capillary: 121 mg/dL — ABNORMAL HIGH (ref 70–99)
Glucose-Capillary: 135 mg/dL — ABNORMAL HIGH (ref 70–99)
Glucose-Capillary: 154 mg/dL — ABNORMAL HIGH (ref 70–99)

## 2012-12-27 MED ORDER — IPRATROPIUM BROMIDE 0.02 % IN SOLN
0.5000 mg | Freq: Four times a day (QID) | RESPIRATORY_TRACT | Status: DC
  Administered 2012-12-27 – 2012-12-28 (×3): 0.5 mg via RESPIRATORY_TRACT
  Filled 2012-12-27 (×3): qty 2.5

## 2012-12-27 MED ORDER — ASPIRIN 300 MG RE SUPP
300.0000 mg | Freq: Every day | RECTAL | Status: DC
  Administered 2012-12-27 – 2013-01-01 (×6): 300 mg via RECTAL
  Filled 2012-12-27 (×7): qty 1

## 2012-12-27 MED ORDER — ALBUTEROL SULFATE (5 MG/ML) 0.5% IN NEBU
2.5000 mg | INHALATION_SOLUTION | Freq: Four times a day (QID) | RESPIRATORY_TRACT | Status: DC
  Administered 2012-12-27 – 2012-12-28 (×3): 2.5 mg via RESPIRATORY_TRACT
  Filled 2012-12-27 (×3): qty 0.5

## 2012-12-27 MED ORDER — ALBUTEROL SULFATE (5 MG/ML) 0.5% IN NEBU: 2.5000 mg | INHALATION_SOLUTION | RESPIRATORY_TRACT | Status: DC | PRN

## 2012-12-27 MED ORDER — KCL IN DEXTROSE-NACL 20-5-0.45 MEQ/L-%-% IV SOLN
INTRAVENOUS | Status: DC
  Administered 2012-12-27 – 2012-12-30 (×5): via INTRAVENOUS
  Administered 2012-12-31: 100 mL/h via INTRAVENOUS
  Administered 2013-01-01 (×2): via INTRAVENOUS
  Filled 2012-12-27 (×17): qty 1000

## 2012-12-27 MED ORDER — INSULIN ASPART 100 UNIT/ML ~~LOC~~ SOLN
0.0000 [IU] | SUBCUTANEOUS | Status: DC
  Administered 2012-12-27: 2 [IU] via SUBCUTANEOUS
  Administered 2012-12-27 (×2): 3 [IU] via SUBCUTANEOUS
  Administered 2012-12-28: 2 [IU] via SUBCUTANEOUS
  Administered 2012-12-28: 3 [IU] via SUBCUTANEOUS
  Administered 2012-12-28: 8 [IU] via SUBCUTANEOUS
  Administered 2012-12-28: 3 [IU] via SUBCUTANEOUS

## 2012-12-27 NOTE — Progress Notes (Signed)
Pt is alert and is sitting in the chair. D/C'd foley cath. Placed condom cath. Will continue to monitor.

## 2012-12-27 NOTE — Progress Notes (Signed)
Pt  79 year old male, a transfer from Neuro  ICU with dx  Of respiratory failure with left side facial droop pt , alert , nonverbal but follows command  Initial assessment done . Pt made comfortable , no acute distress noted . Pt denied pain . Drooling from the mouth and oral suction done . Will continue to monitor.

## 2012-12-27 NOTE — Evaluation (Signed)
Speech Language Pathology Evaluation Patient Details Name: Johnathan Bowman MRN: [id number] DOB: [birth year] Today's Date: 12/27/2012 Time: 4098-1191 SLP Time Calculation (min): 39 min  Problem List:  Patient Active Problem List  Diagnosis  . PLEURAL EFFUSION, LEFT  . PULMONARY NODULE, LEFT UPPER LOBE  . ADENOCARCIMONA, LUNG, NONSMALL CELL  . DVT of leg (deep venous thrombosis)  . DVT (deep venous thrombosis)  . Cancer  . Hx of radiation therapy  . CAP (community acquired pneumonia)  . Hypoxia  . Hypercoagulopathy  . UTI (lower urinary tract infection)  . Hematuria  . Lower extremity edema  . Hypotension  . Altered mental status  . Facial weakness  . Mutism  . Lung cancer  . Acute respiratory failure  . COPD (chronic obstructive pulmonary disease)  . CVA (cerebral infarction)  . Aphasia   Past Medical History:  Past Medical History  Diagnosis Date  . Hypertension   . Diabetes mellitus   . Dyslipidemia   . Mini stroke 2005  . Degenerative disc disease   . GERD (gastroesophageal reflux disease)   . Osteopenia 2005  . Coronary artery disease 2005    treated medically  . BPH (benign prostatic hyperplasia)   . DVT (deep venous thrombosis)   . History of chemotherapy   . Diverticulosis     per scan 05/28/12  . Calculus of kidney     per scan 05/28/12  . Hx of radiation therapy 06/14/12 -07/05/12    left chest  . lung ca 11/12/2010    LUL   Past Surgical History:  Past Surgical History  Procedure Date  . No past surgeries   . Pleurex cath 02/08/11    left-sided pleural effusion   HPI:  79 years old male with PMH relevant for HTN, DM, dyslipidemia, GERD, CAD, DVT. Diagnosed with metastatic (stage IV) non small cell lung cancer in November 2011 (LUL). Treated since then with chemotherapy. Last cycle given in October 2013. He has history of a left sided malignant pleural effusion (cytology from 01/05/12) and had a Pleurex catheter placed in the past. Per chart review,  patient admitted with questionable chest pain and worsening respiratory distress, started on BiPap however deteriorated requiring intubation 12/28-1/1. BSE completed on 12/20/12 indicated decreased management of secretions, difficutly communicating. Pt recommended NPO, question of CVA.  MRI indicates infarct vs metastatic disease. Pt reintubated shortly after eval until 12/26/12.  If pt does not tolerate extubation, will transitino to comfort. Attempted to place NG tube, pt vomited.    Assessment / Plan / Recommendation Clinical Impression  Pt demonstrates severe dysarthria with CN VII and possibly CN X involvement. Aphonia reportedly present prior to intubation which may also impact vocal fold adduction/ability to phonate. Pt also demonstrates signs of a possible expressive/receptive aphasia. Pt demosntrated ability to read at single word level, follow multistep commands. With increased complexity of langauge/grammar in tasks pt becomes confused, unable to follow commands or read phrases (though pt reports he needs his glasses). Written language is unintelligible and pts awareness of poor communicate through writing appears limited. Pt does demonstrate some general intellectual awareness of deficits, cognition and ability to participate in basic/complex functional problem solving will need to be further assessed. Pt would benefit from CIR depending on prognosis and course of care. SLP will continue to treat pt for functional communication with staff and family and participation in ADLs    SLP Assessment  Patient needs continued Speech Lanaguage Pathology Services    Follow Up Recommendations  Inpatient Rehab    Frequency and Duration min 3x week  2 weeks   Pertinent Vitals/Pain NA   SLP Goals  SLP Goals Potential to Achieve Goals: Fair Potential Considerations: Severity of impairments;Co-morbidities;Medical prognosis Progress/Goals/Alternative treatment plan discussed with pt/caregiver and they:  Agree SLP Goal #1: Pt will demonstrate ability to complete functional tasks of moderate complexity with supervision cues.  SLP Goal #2: Pt will utilize communication board to express wants/needs x10 with moderate contextual cues.  SLP Goal #3: Pt will demonstrate comprehension of complex verbal language with 80% accuracy (sentence to conversation level) via y/n/pointing/gestures with moderate cues.   SLP Evaluation Prior Functioning  Cognitive/Linguistic Baseline: Within functional limits   Cognition  Overall Cognitive Status: Impaired Arousal/Alertness: Awake/alert Orientation Level: Oriented to person;Oriented to place Attention: Focused;Sustained;Selective Focused Attention: Appears intact Sustained Attention: Appears intact Memory:  (UTA) Awareness: Impaired Awareness Impairment: Anticipatory impairment;Emergent impairment (continues to try and write though mostly unintelligible) Problem Solving: Impaired Problem Solving Impairment:  (Did not asses due to arrival of PT, TBD) Behaviors: Impulsive Safety/Judgment: Impaired    Comprehension  Auditory Comprehension Overall Auditory Comprehension: Impaired Yes/No Questions: Impaired Basic Biographical Questions: 51-75% accurate Basic Immediate Environment Questions: 50-74% accurate Commands: Impaired One Step Basic Commands: 75-100% accurate Two Step Basic Commands: 50-74% accurate Multistep Basic Commands: 75-100% accurate Conversation: Simple Interfering Components: Visual impairments Visual Recognition/Discrimination Discrimination: Within Function Limits Reading Comprehension Reading Status: Impaired Word level: Within functional limits Sentence Level: Impaired (pt reports he needs his glasses, likely language impaired) Paragraph Level: Not tested Functional Environmental (signs, name badge): Not tested Interfering Components: Visual acuity;Eye glasses not available    Expression Verbal Expression Overall Verbal  Expression:  (UTA secondary to sever dysarthria and dysphonia) Non-Verbal Means of Communication: Gestures;Writing;Communication board (no using wiritng effectively) Written Expression Written Expression: Exceptions to [facility] Self Formulation Ability: Letter (unable to complete full words) Interfering Components: Legibility (possiblity other deficits)   Oral / Motor Oral Motor/Sensory Function Overall Oral Motor/Sensory Function: Impaired Labial ROM: Reduced right;Reduced left Labial Symmetry: Abnormal symmetry right Labial Strength: Reduced Labial Sensation: Within Functional Limits Lingual ROM: Reduced right;Reduced left Lingual Symmetry: Abnormal symmetry right;Abnormal symmetry left Lingual Strength: Reduced Lingual Sensation: Within Functional Limits Facial ROM: Reduced right Facial Symmetry: Right droop Facial Strength: Reduced Facial Sensation: Within Functional Limits Velum:  (UTA) Mandible: Impaired Motor Speech Overall Motor Speech: Impaired Phonation: Aphonic (question dysphonia post extubation vs CN X involvement) Articulation: Impaired Level of Impairment: Word Intelligibility: Intelligibility reduced Word: 0-24% accurate   GO    Harlon Ditty, MA CCC-SLP [phone]  Claudine Mouton 12/27/2012, 2:30 PM

## 2012-12-27 NOTE — Evaluation (Addendum)
Physical Therapy Evaluation Patient Details Name: Johnathan Bowman MRN: [id number] DOB: [birth year] Today's Date: 12/27/2012 Time: 1478-2956 PT Time Calculation (min): 23 min  PT Assessment / Plan / Recommendation Clinical Impression  79 year old male with PMH relevant for HTN, DM, dyslipidemia, GERD, CAD, DVT. Diagnosed with metastatic (stage IV) non small cell lung cancer in November 2011 (LUL). Treated since then with chemotherapy. Last cycle given in October 2013. He has history of a left sided malignant pleural effusion (cytology from 01/05/12) and had a Pleurex catheter placed in the past. Being managed for vdrf met brain vs infarct.  CT head inconclusive with infarct vs. brain met.  Pt will benefit from acute PT services to improve overall mobility and prepare for safe d/c to next venue.  Need to further assess family assistance at home.    PT Assessment  Patient needs continued PT services    Follow Up Recommendations  CIR (depending on prognosis)    Barriers to Discharge  (Need to further determine)      Equipment Recommendations   (Need to further assess)    Recommendations for Other Services Rehab consult   Frequency Min 3X/week    Precautions / Restrictions Precautions Precautions: Fall Restrictions Weight Bearing Restrictions: No   Pertinent Vitals/Pain Shook head "NO" when asked if pt is in pain      Mobility  Bed Mobility Bed Mobility: Supine to Sit Supine to Sit: 4: Min assist Details for Bed Mobility Assistance: (A) to initiate mobility and to elevate trunk OOB with cues for technique. Transfers Transfers: Sit to Stand;Stand to Sit;Stand Pivot Transfers Sit to Stand: 1: +2 Total assist;From bed Sit to Stand: Patient Percentage: 70% Stand to Sit: 1: +2 Total assist;To chair/3-in-1 Stand to Sit: Patient Percentage: 70% Stand Pivot Transfers: 1: +2 Total assist Stand Pivot Transfers: Patient Percentage: 70% Details for Transfer Assistance: +2 (A) to initiate  transfer and slowly descend to recliner with cues for technique.  (A) to maintain balance during tranfer to recliner with VCs for LE placement. Ambulation/Gait Ambulation/Gait Assistance: Not tested (comment)     PT Diagnosis: Difficulty walking;Generalized weakness  PT Problem List: Decreased strength;Decreased activity tolerance;Decreased balance;Decreased mobility;Decreased coordination;Decreased cognition;Decreased knowledge of use of DME PT Treatment Interventions: DME instruction;Gait training;Functional mobility training;Therapeutic activities;Therapeutic exercise;Balance training;Neuromuscular re-education;Cognitive remediation;Patient/family education   PT Goals Acute Rehab PT Goals PT Goal Formulation: With patient Time For Goal Achievement: 01/10/13 Potential to Achieve Goals: Good Pt will go Supine/Side to Sit: with modified independence PT Goal: Supine/Side to Sit - Progress: Goal set today Pt will go Sit to Supine/Side: with modified independence PT Goal: Sit to Supine/Side - Progress: Goal set today Pt will go Sit to Stand: with modified independence PT Goal: Sit to Stand - Progress: Goal set today Pt will go Stand to Sit: with supervision PT Goal: Stand to Sit - Progress: Goal set today Pt will Transfer Bed to Chair/Chair to Bed: with supervision PT Transfer Goal: Bed to Chair/Chair to Bed - Progress: Goal set today Pt will Ambulate: 16 - 50 feet;with min assist;with rolling walker PT Goal: Ambulate - Progress: Goal set today  Visit Information  Last PT Received On: 12/27/12 Assistance Needed: +2    Subjective Data  Subjective: Pt non verbal at this time and unable to write. Patient Stated Goal: Unable to set   Prior Functioning  Home Living Additional Comments: Pt unable to give home environment and PLOF.  Family not present.    Cognition  Overall Cognitive Status:  Impaired Area of Impairment: Problem solving Arousal/Alertness: Awake/alert Orientation  Level:  (difficult to assess due to nonverbal) Behavior During Session: [facility] for tasks performed Problem Solving: Pt slow to process at times but following multiple commands 75% of the time.    Extremity/Trunk Assessment Right Upper Extremity Assessment RUE ROM/Strength/Tone: Within functional levels RUE Sensation: WFL - Light Touch Left Upper Extremity Assessment LUE ROM/Strength/Tone: Within functional levels LUE Sensation: WFL - Light Touch Right Lower Extremity Assessment RLE ROM/Strength/Tone: Within functional levels RLE Sensation: WFL - Light Touch RLE Coordination: Deficits (mild coordination deficits) Left Lower Extremity Assessment LLE ROM/Strength/Tone: Within functional levels LLE Sensation: WFL - Light Touch LLE Coordination: Deficits LLE Coordination Deficits: mild coordination deficits noted with  mobility   Balance Balance Balance Assessed: Yes Static Sitting Balance Static Sitting - Balance Support: Feet supported Static Sitting - Level of Assistance: 5: Stand by assistance Static Standing Balance Static Standing - Balance Support: Bilateral upper extremity supported Static Standing - Level of Assistance: 1: +2 Total assist Static Standing - Comment/# of Minutes: (A) to maintain balance and prevent posterior lean   End of Session PT - End of Session Equipment Utilized During Treatment: Gait belt Activity Tolerance: Patient limited by fatigue Patient left: in chair;with call bell/phone within reach Nurse Communication: Mobility status  GP     Malisha Mabey 12/27/2012, 12:53 PM Jake Shark, PT DPT [phone]

## 2012-12-27 NOTE — Progress Notes (Signed)
Name: Johnathan Bowman MRN: [id number] DOB: 10/21/1929    LOS: 12  PULMONARY / CRITICAL CARE MEDICINE  HPI:   79 year old male with PMH relevant for HTN, DM, dyslipidemia, GERD, CAD, DVT. Diagnosed with metastatic (stage IV) non small cell lung cancer in November 2011 (LUL). Treated since then with chemotherapy. Last cycle given in October 2013. He has history of a left sided malignant pleural effusion (cytology from 01/05/12) and had a Pleurex catheter placed in the past. Being managed for vdrf met brain vs infarct.  INTERVAL:  No distress. No new complaints  Vital Signs: Temp:  [98.4 F (36.9 C)-99.9 F (37.7 C)] 98.7 F (37.1 C) (01/09 2128) Pulse Rate:  [33-117] 100  (01/09 2128) Resp:  [19-29] 20  (01/09 2128) BP: (93-152)/(43-93) 93/64 mmHg (01/09 2128) SpO2:  [93 %-100 %] 95 % (01/09 2128) Weight:  [76.8 kg (169 lb 5 oz)-78.6 kg (173 lb 4.5 oz)] 76.8 kg (169 lb 5 oz) (01/09 0500)  Physical Examination: General: NAD Neuro: + F/C, remains nonverbal, MAEs HEENT:  No change Neck: No JVD Cardiovascular:  RRR s M Lungs: clear anteriorly Abdomen:  Soft, NT, NABS Musculoskeletal:  No edema   ASSESSMENT AND PLAN  PULMONARY  Lab 12/22/12 0425  PHART 7.410  PCO2ART 36.8  PO2ART 104.0*  HCO3 22.9  O2SAT 97.3   Ventilator Settings:       ETT:  12/28>>>1/1 1/2 reintubated>> 1/08  A:   1) Health care associated pneumonia, resolved 2) Acute on chronic respiratory failure; resolved for now 3) Loculated malignant left pleural effusion (hx pleurodesis)  P:   - Cont BDs - DNI  CARDIOVASCULAR  A:  Hemodynamically stable  P:  - [facility] at [facility] at [facility]   RENAL  BMET Lab Results  Component Value Date   CREATININE 1.10 12/27/2012   BUN 36* 12/27/2012   NA 144 12/27/2012   K 3.6 12/27/2012   CL 103 12/27/2012   CO2 28 12/27/2012   Foley:  12/15/12  Intake/Output Summary (Last 24 hours) at 12/27/12 2223 Last data filed at 12/27/12 1830  Gross per 24 hour  Intake 517.33 ml   Output   1571 ml  Net -1053.67 ml   A:   1) Normal kidney function. P:   D/C Lasix Follow chemistries intermittently Replete K+ as needed  GASTROINTESTINAL  Lab 12/26/12 0445  AST 24  ALT 56*  ALKPHOS 107  BILITOT 0.2*  PROT 6.8  ALBUMIN 2.8*   A:   Constipation.  Nutrition P:   - Cont current Rx   HEMATOLOGIC  Lab 12/27/12 0417 12/26/12 0445 12/25/12 0335 12/24/12 0445 12/23/12 0330  HGB 11.5* 10.1* 10.3* 10.2* 9.8*  HCT 38.4* 33.1* 33.2* 33.6* 31.9*  PLT 351 291 284 273 245  INR -- -- -- -- --  APTT -- -- -- -- --   A:   1) Anemia likely due to chronic disease P:  -  follow CBC -  in setting cancer, Lovenox for DVT prophy   INFECTIOUS  Lab 12/27/12 0417 12/26/12 0445 12/25/12 0335 12/24/12 0445 12/23/12 0330  WBC 10.9* 7.2 6.9 5.6 6.0  PROCALCITON -- -- -- -- --   Cultures: Blood 12/28>>>Neg Urine 12/28>>>Neg Sputum 12/28>>>Moderate yeast Antibiotics: Cefepime 12/28>>>12/31 Vancomycin 12/28>>>12/31 Rocephin 12/31>>>1/5  A:   1) Health care associated pneumonia P:   - abx completed   ENDOCRINE  Lab 12/27/12 2126 12/27/12 1607 12/27/12 1210 12/27/12 0802 12/27/12 0410  GLUCAP 182* 154* 135* 121* 127*  A:   1) DM P:   - cont SSI   NEUROLOGIC  A:   1) Altered mental status, with expressive aphasia  Neuro/Stroke has signed off Recommended repeat MRI 7 days after previous In light of limited goals of care, might consider forgoing repeat MRI   Transfer out of ICU TRH to assume 1/10 am and PCCM will sign off   [name], MD ; [facility] [phone].  After 5:30 PM or weekends, call [phone]

## 2012-12-27 NOTE — Clinical Social Work Note (Signed)
Clinical Social Worker continuing to follow patient and family for support and discharge planning needs.  CSW has attempted x3 to reach patient wife at 15:25 01/09 to complete full assessment and discuss possible need for SNF placement at discharge.  CSW has completed FL2 but will need update once patient diet established to initiate bed search.  No family currently present at bedside - CSW to complete full assessment with patient family once patient family available to discuss patient needs.  CSW remains available for emotional support as needed.  Macario Golds, [place] [phone]

## 2012-12-27 NOTE — Progress Notes (Signed)
Rehab Admissions Coordinator Note:  Patient was screened by Trish Mage for appropriateness for an Inpatient Acute Rehab Consult. Noted PT consult done today.  Depending on prognosis and progress, may benefit from an inpatient rehab consult.  Would like to see how patient does over the next couple of therapy sessions.  Call me for questions.   Trish Mage 12/27/2012, 1:58 PM  I can be reached at [phone].

## 2012-12-27 NOTE — Evaluation (Signed)
Clinical/Bedside Swallow Evaluation Patient Details  Name: Johnathan Bowman MRN: [id number] Date of Birth: 04-Sep-1929  Today's Date: 12/27/2012 Time: 1106-1120 SLP Time Calculation (min): 14 min  Past Medical History:  Past Medical History  Diagnosis Date  . Hypertension   . Diabetes mellitus   . Dyslipidemia   . Mini stroke 2005  . Degenerative disc disease   . GERD (gastroesophageal reflux disease)   . Osteopenia 2005  . Coronary artery disease 2005    treated medically  . BPH (benign prostatic hyperplasia)   . DVT (deep venous thrombosis)   . History of chemotherapy   . Diverticulosis     per scan 05/28/12  . Calculus of kidney     per scan 05/28/12  . Hx of radiation therapy 06/14/12 -07/05/12    left chest  . lung ca 11/12/2010    LUL   Past Surgical History:  Past Surgical History  Procedure Date  . No past surgeries   . Pleurex cath 02/08/11    left-sided pleural effusion   HPI:  79 years old male with PMH relevant for HTN, DM, dyslipidemia, GERD, CAD, DVT. Diagnosed with metastatic (stage IV) non small cell lung cancer in November 2011 (LUL). Treated since then with chemotherapy. Last cycle given in October 2013. He has history of a left sided malignant pleural effusion (cytology from 01/05/12) and had a Pleurex catheter placed in the past. Per chart review, patient admitted with questionable chest pain and worsening respiratory distress, started on BiPap however deteriorated requiring intubation 12/28-1/1. BSE completed on 12/20/12 indicated decreased management of secretions, difficutly communicating. Pt recommended NPO, question of CVA.  MRI indicates infarct vs metastatic disease. Pt reintubated shortly after eval until 12/26/12.  If pt does not tolerate extubation, will transitino to comfort. Attempted to place NG tube, pt vomited.    Assessment / Plan / Recommendation Clinical Impression  Pt continues to demonstrate appearance of a severe oral dysphagia with limited  mobility of tongue (little to no lateralization, minimal propulsion) and no phonatory ability. Despite these impairments pt does demonstrate ability to transit bolus and initate swallow response with therapeutic intervention (positioning of bolus in oral cavity, cues for multiple swallows, etc). Overt signs of aspiration present with minimal amount of thin liquids. Likelihood that pt will have safe ability to consume PO in short term very poor. However, pt and family have verbalized no PEG/trach so recommend MBS to determine pts ability to initiate diet with risk/for comfort or if modification may improve function. Will proceed with MBS in am tomorrow.     Aspiration Risk  Severe    Diet Recommendation NPO        Other  Recommendations Recommended Consults: MBS Oral Care Recommendations: Oral care QID   Follow Up Recommendations       Frequency and Duration        Pertinent Vitals/Pain NA    SLP Swallow Goals     Swallow Study Prior Functional Status       General HPI: 79 years old male with PMH relevant for HTN, DM, dyslipidemia, GERD, CAD, DVT. Diagnosed with metastatic (stage IV) non small cell lung cancer in November 2011 (LUL). Treated since then with chemotherapy. Last cycle given in October 2013. He has history of a left sided malignant pleural effusion (cytology from 01/05/12) and had a Pleurex catheter placed in the past. Per chart review, patient admitted with questionable chest pain and worsening respiratory distress, started on BiPap however deteriorated requiring intubation 12/28-1/1.  BSE completed on 12/20/12 indicated decreased management of secretions, difficutly communicating. Pt recommended NPO, question of CVA.  MRI indicates infarct vs metastatic disease. Pt reintubated shortly after eval until 12/26/12.  If pt does not tolerate extubation, will transitino to comfort. Attempted to place NG tube, pt vomited.  Type of Study: Bedside swallow evaluation Previous Swallow  Assessment:  (BSE 12/20/12) Diet Prior to this Study: NPO Temperature Spikes Noted: No Respiratory Status: Supplemental O2 delivered via (comment) History of Recent Intubation: Yes Length of Intubations (days): 12 days Date extubated: 12/26/12 Behavior/Cognition: Alert;Cooperative;Confused Oral Cavity - Dentition: Edentulous Self-Feeding Abilities: Total assist;Able to feed self Patient Positioning: Upright in bed Baseline Vocal Quality: Wet Volitional Cough: Weak Volitional Swallow: Able to elicit    Oral/Motor/Sensory Function Overall Oral Motor/Sensory Function: Impaired Labial ROM: Reduced right;Reduced left (right worse than left) Labial Symmetry: Abnormal symmetry right Labial Strength: Reduced Lingual ROM: Reduced right;Reduced left Lingual Symmetry: Abnormal symmetry right;Abnormal symmetry left Lingual Strength: Reduced Facial ROM: Reduced right Facial Symmetry: Right droop Facial Strength: Reduced Velum:  (unable to visualize) Mandible: Impaired   Ice Chips Ice chips: Impaired Presentation: Spoon Oral Phase Impairments: Reduced labial seal;Reduced lingual movement/coordination;Impaired anterior to posterior transit Oral Phase Functional Implications: Right anterior spillage;Right lateral sulci pocketing;Prolonged oral transit;Oral residue Pharyngeal Phase Impairments: Cough - Delayed;Suspected delayed Swallow;Decreased hyoid-laryngeal movement   Thin Liquid Thin Liquid: Impaired Presentation: Spoon Oral Phase Impairments: Reduced labial seal;Reduced lingual movement/coordination;Impaired anterior to posterior transit Oral Phase Functional Implications: Right anterior spillage;Right lateral sulci pocketing;Prolonged oral transit;Oral residue Pharyngeal  Phase Impairments: Decreased hyoid-laryngeal movement;Suspected delayed Swallow;Multiple swallows;Cough - Delayed    Nectar Thick Nectar Thick Liquid: Not tested   Honey Thick Honey Thick Liquid: Not tested   Puree  Puree: Impaired Presentation: Spoon Oral Phase Impairments: Reduced labial seal;Reduced lingual movement/coordination;Impaired anterior to posterior transit Oral Phase Functional Implications: Right lateral sulci pocketing;Prolonged oral transit Pharyngeal Phase Impairments: Multiple swallows   Solid   GO    Solid: Not tested      Harlon Ditty, MA CCC-SLP [phone]  Claudine Mouton 12/27/2012,2:08 PM

## 2012-12-28 ENCOUNTER — Inpatient Hospital Stay (HOSPITAL_COMMUNITY): Payer: Medicare Other

## 2012-12-28 DIAGNOSIS — I6350 Cerebral infarction due to unspecified occlusion or stenosis of unspecified cerebral artery: Secondary | ICD-10-CM

## 2012-12-28 LAB — BASIC METABOLIC PANEL
BUN: 40 mg/dL — ABNORMAL HIGH (ref 6–23)
Calcium: 10.2 mg/dL (ref 8.4–10.5)
Creatinine, Ser: 1.1 mg/dL (ref 0.50–1.35)
GFR calc Af Amer: 71 mL/min — ABNORMAL LOW (ref >90)
GFR calc non Af Amer: 61 mL/min — ABNORMAL LOW (ref >90)

## 2012-12-28 LAB — CBC
HCT: 37.5 % — ABNORMAL LOW (ref 39.0–52.0)
MCHC: 30.1 g/dL (ref 30.0–36.0)
Platelets: 391 10*3/uL (ref 150–400)
RDW: 14.3 % (ref 11.5–15.5)
WBC: 9.2 10*3/uL (ref 4.0–10.5)

## 2012-12-28 LAB — GLUCOSE, CAPILLARY
Glucose-Capillary: 135 mg/dL — ABNORMAL HIGH (ref 70–99)
Glucose-Capillary: 163 mg/dL — ABNORMAL HIGH (ref 70–99)
Glucose-Capillary: 188 mg/dL — ABNORMAL HIGH (ref 70–99)

## 2012-12-28 MED ORDER — INSULIN ASPART 100 UNIT/ML ~~LOC~~ SOLN
0.0000 [IU] | Freq: Four times a day (QID) | SUBCUTANEOUS | Status: DC
  Administered 2012-12-28 – 2012-12-29 (×4): 3 [IU] via SUBCUTANEOUS
  Administered 2012-12-29 – 2012-12-30 (×2): 5 [IU] via SUBCUTANEOUS
  Administered 2012-12-30: 2 [IU] via SUBCUTANEOUS
  Administered 2012-12-31: 5 [IU] via SUBCUTANEOUS
  Administered 2012-12-31 (×3): 2 [IU] via SUBCUTANEOUS
  Administered 2013-01-02: 3 [IU] via SUBCUTANEOUS
  Administered 2013-01-02: 2 [IU] via SUBCUTANEOUS

## 2012-12-28 MED ORDER — ALBUTEROL SULFATE (5 MG/ML) 0.5% IN NEBU: 2.5000 mg | INHALATION_SOLUTION | RESPIRATORY_TRACT | Status: DC | PRN

## 2012-12-28 MED ORDER — IPRATROPIUM BROMIDE 0.02 % IN SOLN
0.5000 mg | Freq: Four times a day (QID) | RESPIRATORY_TRACT | Status: DC
  Administered 2012-12-28 – 2013-01-01 (×12): 0.5 mg via RESPIRATORY_TRACT
  Filled 2012-12-28 (×15): qty 2.5

## 2012-12-28 MED ORDER — ALBUTEROL SULFATE (5 MG/ML) 0.5% IN NEBU
2.5000 mg | INHALATION_SOLUTION | Freq: Four times a day (QID) | RESPIRATORY_TRACT | Status: DC
  Administered 2012-12-28 – 2013-01-01 (×12): 2.5 mg via RESPIRATORY_TRACT
  Filled 2012-12-28 (×15): qty 0.5

## 2012-12-28 NOTE — Progress Notes (Signed)
Physical Therapy Treatment Patient Details Name: Johnathan Bowman MRN: [id number] DOB: [birth year] Today's Date: 12/28/2012 Time: 4696-2952 PT Time Calculation (min): 44 min  PT Assessment / Plan / Recommendation Comments on Treatment Session  79 year old male with PMH relevant for HTN, DM, dyslipidemia, GERD, CAD, DVT. Diagnosed with metastatic (stage IV) non small cell lung cancer in November 2011 (LUL). Treated since then with chemotherapy. Last cycle given in October 2013. He has history of a left sided malignant pleural effusion (cytology from 01/05/12) and had a Pleurex catheter placed in the past. Being managed for vdrf met brain vs infarct. CT head inconclusive. Pt reporting today that he is having double vision and when attempted to cover one eye, he wrote that he was seeing "1 1/2". Unable to currently use alphabet chart because he is seeing multipes of each letter. Had difficulty find button on call bell system due to double vision, so added a piece of gauze over the button to allow him to feel the button. Would be a co-treatment with OT to further assess vision and how it is impacting his balance.    Follow Up Recommendations  CIR;Supervision/Assistance - 24 hour     Does the patient have the potential to tolerate intense rehabilitation     Barriers to Discharge        Equipment Recommendations  Other (comment) (TBA)    Recommendations for Other Services    Frequency Min 3X/week   Plan Discharge plan remains appropriate;Frequency remains appropriate    Precautions / Restrictions Precautions Precautions: Fall   Pertinent Vitals/Pain Denied pain; indicated dizziness with postural changes (that resolved in < 1 minute)    Mobility  Bed Mobility Bed Mobility: Supine to Sit;Sitting - Scoot to Edge of Bed Supine to Sit: 4: Min guard;HOB elevated (HOB 20) Sitting - Scoot to Edge of Bed: 4: Min guard Details for Bed Mobility Assistance: incr effort and near need for assist; as  shifting forward to EOB, pt uses momentum and excessive hip flexion, however maintained control Transfers Transfers: Sit to Stand;Stand to Sit Sit to Stand: 4: Min assist;With upper extremity assist;From bed Stand to Sit: 1: +2 Total assist;With upper extremity assist;To chair/3-in-1 Stand to Sit: Patient Percentage: 70% Details for Transfer Assistance: pt required steadying assist to come to stand; reported dizziness, but then indicated improving; turning to back up to chair, pt rushing and off-balance and required 2 person assist Ambulation/Gait Ambulation/Gait Assistance: 1: +2 Total assist Ambulation/Gait: Patient Percentage: 60% Ambulation Distance (Feet): 30 Feet Assistive device: 2 person hand held assist Ambulation/Gait Assistance Details: flexed posture; base of support varies between very narrow (nearly scissoring) to wide, staggering; staggers to his Rt; ? related to double vision    Exercises     PT Diagnosis:    PT Problem List:   PT Treatment Interventions:     PT Goals Acute Rehab PT Goals Pt will go Supine/Side to Sit: with modified independence PT Goal: Supine/Side to Sit - Progress: Progressing toward goal Pt will go Sit to Stand: with modified independence PT Goal: Sit to Stand - Progress: Progressing toward goal Pt will go Stand to Sit: with supervision PT Goal: Stand to Sit - Progress: Progressing toward goal Pt will Ambulate: 16 - 50 feet;with min assist;with rolling walker PT Goal: Ambulate - Progress: Progressing toward goal  Visit Information  Last PT Received On: 12/28/12 Assistance Needed: +2    Subjective Data  Subjective: Pt nonverbal; writing (at times very difficult to read); wrote "  double vision" and "can you help me" Patient Stated Goal: Unable to set   Cognition  Overall Cognitive Status: Appears within functional limits for tasks assessed/performed Arousal/Alertness: Awake/alert Behavior During Session: [facility] for tasks performed Problem  Solving: following all commands; very patient as writing words and therapist trying to identify his letters (poorly written)    Balance  Static Sitting Balance Static Sitting - Balance Support: No upper extremity supported;Feet supported Static Sitting - Level of Assistance: 7: Independent Static Sitting - Comment/# of Minutes: EOB ~ 4 minutes as IV untangled from front and back gown; pt very patient and making no attempts to stand before asked to Static Standing Balance Static Standing - Balance Support: Left upper extremity supported Static Standing - Level of Assistance: 4: Min assist Static Standing - Comment/# of Minutes: 1 minute, wide base of support; no sway  End of Session PT - End of Session Equipment Utilized During Treatment: Gait belt;Oxygen (O2 at 3L) Activity Tolerance: Treatment limited secondary to medical complications (Comment) (double vision; staggering gait) Patient left: in chair;with call bell/phone within reach Nurse Communication: Mobility status;Other (comment) (double vision & unable to use alphabet chart )   GP     Timisha Mondry 12/28/2012, 3:03 PM Pager [phone]

## 2012-12-28 NOTE — Procedures (Signed)
Objective Swallowing Evaluation: Modified Barium Swallowing Study  Patient Details  Name: Johnathan Bowman MRN: [id number] Date of Birth: 01-15-1929  Today's Date: 12/28/2012 Time: 4098-1191 SLP Time Calculation (min): 30 min  Past Medical History:  Past Medical History  Diagnosis Date  . Hypertension   . Diabetes mellitus   . Dyslipidemia   . Mini stroke 2005  . Degenerative disc disease   . GERD (gastroesophageal reflux disease)   . Osteopenia 2005  . Coronary artery disease 2005    treated medically  . BPH (benign prostatic hyperplasia)   . DVT (deep venous thrombosis)   . History of chemotherapy   . Diverticulosis     per scan 05/28/12  . Calculus of kidney     per scan 05/28/12  . Hx of radiation therapy 06/14/12 -07/05/12    left chest  . lung ca 11/12/2010    LUL   Past Surgical History:  Past Surgical History  Procedure Date  . No past surgeries   . Pleurex cath 02/08/11    left-sided pleural effusion   HPI:  79 years old male with PMH relevant for HTN, DM, dyslipidemia, GERD, CAD, DVT. Diagnosed with metastatic (stage IV) non small cell lung cancer in November 2011 (LUL). Treated since then with chemotherapy. Last cycle given in October 2013. He has history of a left sided malignant pleural effusion (cytology from 01/05/12) and had a Pleurex catheter placed in the past. Per chart review, patient admitted with questionable chest pain and worsening respiratory distress, started on BiPap however deteriorated requiring intubation 12/28-1/1. BSE completed on 12/20/12 indicated decreased management of secretions, difficutly communicating. Pt recommended NPO, question of CVA.  MRI indicates infarct vs metastatic disease. Pt reintubated shortly after eval until 12/26/12.  If pt does not tolerate extubation, will transitino to comfort. Attempted to place NG tube, pt vomited.      Assessment / Plan / Recommendation Clinical Impression  Dysphagia Diagnosis: Severe oral phase  dysphagia;Severe pharyngeal phase dysphagia (suspected severe pharyngeal dysphagia) Clinical impression: Patient presents with a primary severe oral phase dysphagia and a suspected moderate-severe pharyngeal dysphagia as indicated by MBS. Oral phase characterized by significant involvement of CN V and VII resulting in limited lingual and labial movement and inability to orally transit clinician provided bolus despite therapuetic intervention which included manual placement of bolus posteriorly in oral cavity, manually labial closure, and altering bolus size and presentation (spoon, straw, cup, etc).  Instead, majority of bolus spilled anteriorly. One episode of spillage of a small amount of thin liquids noted to the level of the vallecula strickly due to bolus consistency which elicited a delayed swallow response. Weak base of tongue, decreased hyo-laryngeal elevation/excursion result in inability to move bolus from vallecula into esophagus. At this time, aspiration risk remains very high in addition to inefficiency of po intake. SLP will plan to discuss further with patient and family in order to determine best plan of care in light of refusal of non-oral means of nutrition and unclear prognosis given unclear origin of deficits (CVA vs brain mets per notes?).    Treatment Recommendation   (TBD pending consultation with patient and family)    Diet Recommendation NPO   Medication Administration: Via alternative means    Other  Recommendations Oral Care Recommendations: Oral care QID   Follow Up Recommendations  Inpatient Rehab               General HPI: 79 years old male with PMH relevant for HTN, DM,  dyslipidemia, GERD, CAD, DVT. Diagnosed with metastatic (stage IV) non small cell lung cancer in November 2011 (LUL). Treated since then with chemotherapy. Last cycle given in October 2013. He has history of a left sided malignant pleural effusion (cytology from 01/05/12) and had a Pleurex catheter  placed in the past. Per chart review, patient admitted with questionable chest pain and worsening respiratory distress, started on BiPap however deteriorated requiring intubation 12/28-1/1. BSE completed on 12/20/12 indicated decreased management of secretions, difficutly communicating. Pt recommended NPO, question of CVA.  MRI indicates infarct vs metastatic disease. Pt reintubated shortly after eval until 12/26/12.  If pt does not tolerate extubation, will transitino to comfort. Attempted to place NG tube, pt vomited.  Type of Study: Modified Barium Swallowing Study Reason for Referral: Objectively evaluate swallowing function Previous Swallow Assessment: Bedside swallow re-eval complete 1/9 indicated a continued severe dysphagia with recommendations for MBS  Diet Prior to this Study: NPO Temperature Spikes Noted: No Respiratory Status: Supplemental O2 delivered via (comment) ([place] ) History of Recent Intubation: Yes Length of Intubations (days): 12 days Date extubated: 12/26/12 Behavior/Cognition: Alert;Cooperative;Confused (aphasic) Oral Cavity - Dentition: Edentulous Oral Motor / Sensory Function: Impaired - see Bedside swallow eval Self-Feeding Abilities: Able to feed self Patient Positioning: Upright in chair Baseline Vocal Quality: Wet Volitional Cough: Weak Volitional Swallow: Unable to elicit Anatomy: Within functional limits Pharyngeal Secretions: Not observed secondary MBS    Reason for Referral Objectively evaluate swallowing function   Oral Phase Oral Preparation/Oral Phase Oral Phase: Impaired Oral - Nectar Oral - Nectar Teaspoon: Left anterior bolus loss;Right anterior bolus loss;Weak lingual manipulation;Lingual pumping;Incomplete tongue to palate contact;Reduced posterior propulsion;Holding of bolus;Right pocketing in lateral sulci;Left pocketing in lateral sulci;Pocketing in anterior sulcus;Lingual/palatal residue;Delayed oral transit (inability to orally transit bolus) Oral -  Thin Oral - Thin Teaspoon: Left anterior bolus loss;Right anterior bolus loss;Weak lingual manipulation;Lingual pumping;Incomplete tongue to palate contact;Reduced posterior propulsion;Holding of bolus;Right pocketing in lateral sulci;Left pocketing in lateral sulci;Pocketing in anterior sulcus;Lingual/palatal residue;Delayed oral transit Oral - Thin Cup: Left anterior bolus loss;Right anterior bolus loss;Weak lingual manipulation;Lingual pumping;Incomplete tongue to palate contact;Reduced posterior propulsion;Holding of bolus;Right pocketing in lateral sulci;Left pocketing in lateral sulci;Pocketing in anterior sulcus;Lingual/palatal residue;Delayed oral transit Oral - Thin Straw: Left anterior bolus loss;Right anterior bolus loss;Weak lingual manipulation;Lingual pumping;Incomplete tongue to palate contact;Reduced posterior propulsion;Holding of bolus;Right pocketing in lateral sulci;Left pocketing in lateral sulci;Pocketing in anterior sulcus;Lingual/palatal residue;Delayed oral transit (patient unable to draw liquids via straw) Oral - Solids Oral - Puree: Left anterior bolus loss;Right anterior bolus loss;Weak lingual manipulation;Lingual pumping;Incomplete tongue to palate contact;Reduced posterior propulsion;Holding of bolus;Right pocketing in lateral sulci;Left pocketing in lateral sulci;Pocketing in anterior sulcus;Lingual/palatal residue;Delayed oral transit   Pharyngeal Phase Pharyngeal Phase Pharyngeal Phase: Impaired Pharyngeal - Nectar Pharyngeal - Nectar Teaspoon:  (bolus did not reach pharynx for initiation of swallow) Pharyngeal - Thin Pharyngeal - Thin Teaspoon:  (bolus did not reach pharynx for initiation of swallow) Pharyngeal - Thin Cup:  (bolus did not reach pharynx for initiation of swallow) Pharyngeal - Thin Straw: Premature spillage to valleculae;Delayed swallow initiation;Reduced anterior laryngeal mobility;Reduced laryngeal elevation;Reduced airway/laryngeal closure;Reduced  tongue base retraction;Pharyngeal residue - valleculae Pharyngeal - Solids Pharyngeal - Puree:  (bolus did not reach pharynx for initiation of swallow)  Cervical Esophageal Phase    GO   Launa Goedken MA, CCC-SLP [phone]  Cervical Esophageal Phase Cervical Esophageal Phase:  (n/a; bolus did not reach esophagus)         Srikar Chiang Meryl 12/28/2012, 12:38  PM   

## 2012-12-28 NOTE — Progress Notes (Signed)
NUTRITION FOLLOW UP  Intervention:   Supplement diet once diet advanced.   Nutrition Dx:   Inadequate oral intake related to inability to eat as evidenced by NPO status; ongoing.  Goal:   Pt to  meet >/= 90% of their estimated nutrition needs; not met.   Monitor:   Swallow evaluation, ability to start PO diet  Assessment:   Patient extubated 1/8. Pt has failed swallow eval, per records family does not want to pursue a feeding tube. Per MD note will need Palliative care consult to establish goals of care.   Pt awake and alert in the bed.  Pt discussed with RN.   Patient with hx of metastatic (stage IV) non small cell lung cancer; last cycle of chemo in October '13  Height: Ht Readings from Last 1 Encounters:  12/15/12 5\' 7"  (1.702 m)    Weight Status:   Wt Readings from Last 1 Encounters:  12/27/12 169 lb 5 oz (76.8 kg)    Re-estimated needs:  Kcal: 1700-1900 Protein: 97-110 grams Fluid: >1.7 L/day  Skin: abdominal blister  Diet Order: NPO   Intake/Output Summary (Last 24 hours) at 12/28/12 1613 Last data filed at 12/27/12 1830  Gross per 24 hour  Intake    100 ml  Output      0 ml  Net    100 ml   Last BM: 1/9  Labs:   Lab 12/28/12 0503 12/27/12 0417 12/26/12 0445 12/22/12 0453  NA 147* 144 140 --  K 4.0 3.6 3.3* --  CL 105 103 99 --  CO2 29 28 28  --  BUN 40* 36* 45* --  CREATININE 1.10 1.10 1.14 --  CALCIUM 10.2 10.3 9.5 --  MG -- -- 1.9 2.0  PHOS -- -- 4.2 3.3  GLUCOSE 149* 149* 208* --    CBG (last 3)   Basename 12/28/12 1141 12/28/12 0845 12/28/12 0412  GLUCAP 188* 163* 135*    Scheduled Meds:    . albuterol  2.5 mg Nebulization QID  . antiseptic oral rinse  15 mL Mouth Rinse QID  . aspirin  300 mg Rectal Daily  . chlorhexidine  15 mL Mouth Rinse BID  . enoxaparin (LOVENOX) injection  40 mg Subcutaneous Q24H  . insulin aspart  0-15 Units Subcutaneous Q6H  . ipratropium  0.5 mg Nebulization QID    Continuous Infusions:    .  dextrose 5 % and 0.45 % NaCl with KCl 20 mEq/L 100 mL/hr at 12/28/12 [address], LDN, CNSC [phone] Pager [phone] After Hours Pager

## 2012-12-28 NOTE — Progress Notes (Signed)
SLP attempted to provide education to family following MBS however family not present and per RN Victorino Dike), phone numbers are incorrect for contacting them. Per RN, family supposed to call back with correct number and has not yet done so. SLP will f/u 1/11.   Ferdinand Lango MA, CCC-SLP [phone]

## 2012-12-28 NOTE — Progress Notes (Addendum)
[facility] Progress Note [place] TEAM 1 - Stepdown/ICU TEAM   Johnathan Bowman:[id number] DOB: 12/04/1929 DOA: 12/15/2012 PCP: Dorrene German, MD  Brief narrative: 79 year old male with PMH relevant for HTN, DM, dyslipidemia, GERD, CAD, DVT. Diagnosed with metastatic (stage IV) non small cell lung cancer in November 2011 (LUL). Treated since then with chemotherapy. Last cycle given in October 2013. He has history of a left sided malignant pleural effusion (cytology from 01/05/12) and had a Pleurex catheter placed in the past.  He presented to the [facility] ER on 12/28 with a hx of experiencing progressive SOB, productive cough and chills for one week. The day of his admission, he was found by his wife with AMS, some questionable chest pain and worsening respiratory distress. Upon arrival to the ED he was hypoxic and was started on BIPAP. His mental status deteriorated while in the ED and he had to be intubated.   He was treated in the ICU on the PCCM Service until his transfer to a medical bed on 12/27/2012.  He completed a course of IV abx for community acquired PNA.  He was subsequently extubated on 12/19/2012.  His post extubation neurologic exam raised concern for a CNS event (stroke vs/ metastatic disease). Neurology was consulted.  Pt required re-intubation on 12/20/2012 due to recurrent acute resp decline due to inability to protect his own airway.  An EEG was w/o evidence of Sz.  MRI on 12/21/2012 raised suspicion for CVA +/- metastatic disease.  Neurology signed off.  The family then met with PCCM, and decided to pursue extubation w/ no plans for re-intubation.  The decision had been made earlier to not pursue PEG tube.  He was extubated for the second time on 12/26/2012.  At that time an attempt was made to pass an NG for nutrition, but the pt suffered w/ repeat vomiting, and further attempts were aborted.  On 12/27/2012 the pt was transferred out of the Neuro ICU to the Neuro  floor.   Assessment/Plan:  HCAP Resolved - has completed abx tx  Loculated malignant L pelural effusion (hx of pleurodesis) PCCM commented that pt may need replacement of Pluerex cath, but was not yet committed to same - pt is in no resp distress at this time, so I do not feel that intervention is indicated presently   Acute on chronic resp failure Due to HCAP + lung CA - resolved for now  Expressive aphasia + severe dysphagia - acute/subacute CVA R Internal Capsule - possible tumor vs CVA L frontal lobe - remote w/ possible overlying subacute L occipital lobe CVA - subacute L parietal lobe CVA -  Neuro/Stroke has signed off - recommended repeat MRI 7 days after previous (today is day 7 since prior MRI) - failed swallow study today with little clinical hope that short term feeding would significantly impact ability to recover swallow in short course - will obtain f/u MRI for prognostic purposes - once results are available, will need to consider Palliative Care consult to more definitively establish clear goals of care with pt/family  Metastatic (stage IV) non small cell lung cancer  Anemia due to chronic disease Hgb holding steady  HTN BP well controlled at the present time  DM CBG reasonably controlled   Dyslipidemia  GERD  CAD  Code Status: DNR/NO CODE Family Communication: no family present at time of visit today - attempted to reach spouse as indicated on face sheet to no avail Disposition Plan: remain on Neuro  Floor   Consultants: None active at this time  Antibiotics: Cefepime 12/28>>>12/31  Vancomycin 12/28>>>12/31  Rocephin 12/31>>>1/5  DVT prophylaxis: lovenox  HPI/Subjective: Pt is alert and interactive.  Appears to understand questions.  Is having great difficulty handling secretions, with frequent drooling.  Denies pain with head nod.  Seems to have indicated to therapist that he is experiencing double vision.     Objective: Blood pressure 130/79,  pulse 107, temperature 98.3 F (36.8 C), temperature source Oral, resp. rate 18, height 5\' 7"  (1.702 m), weight 76.8 kg (169 lb 5 oz), SpO2 99.00%.  Intake/Output Summary (Last 24 hours) at 12/28/12 1426 Last data filed at 12/27/12 1830  Gross per 24 hour  Intake 193.33 ml  Output      1 ml  Net 192.33 ml     Exam: General: No acute respiratory distress at rest, but with marked trouble handling his own secretions Lungs: poor air movement in B bases - no wheeze  Cardiovascular: Regular rate and rhythm without murmur gallop or rub Abdomen: Nontender, nondistended, soft, bowel sounds positive, no rebound, no ascites, no appreciable mass Extremities: No significant cyanosis or clubbing;  1+ edema bilateral lower extremities Neuro:  Prominent R facial droop - drooling as a result - alert and interactive  Data Reviewed: Basic Metabolic Panel:  Lab 12/28/12 1610 12/27/12 0417 12/26/12 0445 12/25/12 0335 12/24/12 0445 12/22/12 0453  NA 147* 144 140 140 138 --  K 4.0 3.6 3.3* 3.5 3.9 --  CL 105 103 99 101 102 --  CO2 29 28 28 27 26  --  GLUCOSE 149* 149* 208* 221* 203* --  BUN 40* 36* 45* 36* 28* --  CREATININE 1.10 1.10 1.14 1.16 1.03 --  CALCIUM 10.2 10.3 9.5 9.6 9.4 --  MG -- -- 1.9 -- -- 2.0  PHOS -- -- 4.2 -- -- 3.3   Liver Function Tests:  Lab 12/26/12 0445  AST 24  ALT 56*  ALKPHOS 107  BILITOT 0.2*  PROT 6.8  ALBUMIN 2.8*   CBC:  Lab 12/28/12 0503 12/27/12 0417 12/26/12 0445 12/25/12 0335 12/24/12 0445  WBC 9.2 10.9* 7.2 6.9 5.6  NEUTROABS -- -- -- -- --  HGB 11.3* 11.5* 10.1* 10.3* 10.2*  HCT 37.5* 38.4* 33.1* 33.2* 33.6*  MCV 87.8 87.3 86.0 86.0 86.6  PLT 391 351 291 284 273    Basename 12/15/12 0247 09/10/12 1230 01/18/12 1020  PROBNP 95.3 125.6 68.7   CBG:  Lab 12/28/12 1141 12/28/12 0845 12/28/12 0412 12/28/12 0053 12/27/12 2126  GLUCAP 188* 163* 135* 163* 182*    Studies:  Recent x-ray studies have been reviewed in detail by the Attending  Physician  Scheduled Meds:  Reviewed in detail by the Attending Physician   Lonia Blood, MD [facility] Office  [phone] Pager [phone]  On-Call/Text Page:      [email or web address]      password [email or web address]  If 7PM-7AM, please contact night-coverage [email or web address] Password [email or web address] 12/28/2012, 2:26 PM   LOS: 13 days

## 2012-12-29 ENCOUNTER — Inpatient Hospital Stay (HOSPITAL_COMMUNITY): Payer: Medicare Other

## 2012-12-29 LAB — COMPREHENSIVE METABOLIC PANEL
ALT: 31 U/L (ref 0–53)
AST: 15 U/L (ref 0–37)
Albumin: 3.2 g/dL — ABNORMAL LOW (ref 3.5–5.2)
Alkaline Phosphatase: 109 U/L (ref 39–117)
Chloride: 109 mEq/L (ref 96–112)
Potassium: 4.1 mEq/L (ref 3.5–5.1)
Total Bilirubin: 0.4 mg/dL (ref 0.3–1.2)

## 2012-12-29 LAB — CBC
MCH: 26.6 pg (ref 26.0–34.0)
MCHC: 30.3 g/dL (ref 30.0–36.0)
MCV: 87.7 fL (ref 78.0–100.0)
Platelets: 371 10*3/uL (ref 150–400)
RDW: 14.2 % (ref 11.5–15.5)

## 2012-12-29 LAB — GLUCOSE, CAPILLARY
Glucose-Capillary: 170 mg/dL — ABNORMAL HIGH (ref 70–99)
Glucose-Capillary: 174 mg/dL — ABNORMAL HIGH (ref 70–99)
Glucose-Capillary: 224 mg/dL — ABNORMAL HIGH (ref 70–99)

## 2012-12-29 MED ORDER — SODIUM CHLORIDE 0.9 % IJ SOLN
10.0000 mL | INTRAMUSCULAR | Status: DC | PRN
  Administered 2012-12-29 – 2013-01-02 (×3): 10 mL

## 2012-12-29 MED ORDER — JEVITY 1.2 CAL PO LIQD
1000.0000 mL | ORAL | Status: DC
  Filled 2012-12-29 (×3): qty 1000

## 2012-12-29 MED ORDER — LORAZEPAM 2 MG/ML IJ SOLN: 1.0000 mg | Freq: Once | INTRAMUSCULAR | Status: DC | PRN

## 2012-12-29 MED ORDER — GADOBENATE DIMEGLUMINE 529 MG/ML IV SOLN
15.0000 mL | Freq: Once | INTRAVENOUS | Status: AC | PRN
  Administered 2012-12-29: 15 mL via INTRAVENOUS

## 2012-12-29 MED ORDER — LORAZEPAM 2 MG/ML IJ SOLN
1.0000 mg | Freq: Once | INTRAMUSCULAR | Status: AC | PRN
  Filled 2012-12-29: qty 1

## 2012-12-29 MED ORDER — INSULIN DETEMIR 100 UNIT/ML ~~LOC~~ SOLN
10.0000 [IU] | Freq: Every day | SUBCUTANEOUS | Status: DC
  Filled 2012-12-29: qty 10

## 2012-12-29 NOTE — Progress Notes (Signed)
OT Cancellation Note  Patient Details Name: BOHDI LEEDS MRN: [id number] DOB: 1929/10/23   Cancelled Treatment:    Reason Eval/Treat Not Completed: Patient at procedure or test/ unavailable  Renn Dirocco, [place] 12/29/2012, 3:34 PM

## 2012-12-29 NOTE — Progress Notes (Signed)
[facility] Progress Note [place] TEAM 1 - Stepdown/ICU TEAM   Johnathan Bowman JXB:[id number] DOB: 1929/01/17 DOA: 12/15/2012 PCP: Dorrene German, MD  Brief narrative: 79 year old male with PMH relevant for HTN, DM, dyslipidemia, GERD, CAD, DVT. Diagnosed with metastatic (stage IV) non small cell lung cancer in November 2011 (LUL). Treated since then with chemotherapy. Last cycle given in October 2013. He has history of a left sided malignant pleural effusion (cytology from 01/05/12) and had a Pleurex catheter placed in the past.  He presented to the [facility] ER on 12/28 with a hx of experiencing progressive SOB, productive cough and chills for one week. The day of his admission, he was found by his wife with AMS, some questionable chest pain and worsening respiratory distress. Upon arrival to the ED he was hypoxic and was started on BIPAP. His mental status deteriorated while in the ED and he had to be intubated.   He was treated in the ICU on the PCCM Service until his transfer to a medical bed on 12/27/2012.  He completed a course of IV abx for community acquired PNA.  He was subsequently extubated on 12/19/2012.  His post extubation neurologic exam raised concern for a CNS event (stroke vs/ metastatic disease). Neurology was consulted.  Pt required re-intubation on 12/20/2012 due to recurrent acute resp decline due to inability to protect his own airway.  An EEG was w/o evidence of Sz.  MRI on 12/21/2012 raised suspicion for CVA +/- metastatic disease.  Neurology signed off.  The family then met with PCCM, and decided to pursue extubation w/ no plans for re-intubation.  The decision had been made earlier to not pursue PEG tube.  He was extubated for the second time on 12/26/2012.  At that time an attempt was made to pass an NG for nutrition, but the pt suffered w/ repeat vomiting, and further attempts were aborted.  On 12/27/2012 the pt was transferred out of the Neuro ICU to the Neuro  floor.   Assessment/Plan:  HCAP Resolved - has completed abx tx  Loculated malignant L pelural effusion (hx of pleurodesis) PCCM commented that pt may need replacement of Pluerex cath, but was not yet committed to same - pt is in no resp distress at this time, so I do not feel that intervention is indicated presently   Acute on chronic resp failure Due to HCAP + lung CA - resolved for now  Expressive aphasia + severe dysphagia - acute/subacute CVA R Internal Capsule - possible tumor vs CVA L frontal lobe - remote w/ possible overlying subacute L occipital lobe CVA - subacute L parietal lobe CVA -  Neuro/Stroke has signed off - F/U MRI has confirmed acute on chronic ischemic insults of the L MCA and L PCA territories - failed swallow study - little clinical hope that short term feeding would significantly impact ability to recover swallow in short course, but pt himself is able to indicate to me that he wishes to give it a try - will ask RN to place NG and initiate temporary feeding once placement confirmed  Metastatic (stage IV) non small cell lung cancer  Anemia due to chronic disease Hgb holding steady  HTN BP well controlled at the present time  DM CBG not at goal while on dextrose - adjust tx and follow    Dyslipidemia  GERD  CAD  Code Status: DNR/NO CODE Family Communication: no family present at time of visit today Disposition Plan: remain on Neuro  Floor   Consultants: None active at this time  Antibiotics: Cefepime 12/28>>>12/31  Vancomycin 12/28>>>12/31  Rocephin 12/31>>>1/5  DVT prophylaxis: lovenox  HPI/Subjective: Pt is alert and interactive.  Appears to understand questions.  Is having great difficulty handling secretions, with frequent drooling.  Denies pain with head nod.  Indicates that he is hungry.  When I explain that he is unsafe to eat and that tube feeding via NG is only current option, he nods to indicate yes.  When I explain further he again  confirms via head nod that he wishes to have NG place.  He rubs his stomach to indicate hunger.     Objective: Blood pressure 126/72, pulse 81, temperature 97.7 F (36.5 C), temperature source Axillary, resp. rate 20, height 5\' 7"  (1.702 m), weight 76.8 kg (169 lb 5 oz), SpO2 95.00%.  Intake/Output Summary (Last 24 hours) at 12/29/12 1742 Last data filed at 12/28/12 1853  Gross per 24 hour  Intake      0 ml  Output    250 ml  Net   -250 ml     Exam: General: No acute respiratory distress at rest, but with marked trouble handling his own secretions Lungs: poor air movement in B bases - no wheeze  Cardiovascular: Regular rate and rhythm without murmur gallop or rub Abdomen: Nontender, nondistended, soft, bowel sounds positive, no rebound, no ascites, no appreciable mass Extremities: No significant cyanosis or clubbing;  1+ edema bilateral lower extremities Neuro:  Prominent R facial droop - drooling as a result - alert and interactive  Data Reviewed: Basic Metabolic Panel:  Lab 12/29/12 9147 12/28/12 0503 12/27/12 0417 12/26/12 0445 12/25/12 0335  NA 148* 147* 144 140 140  K 4.1 4.0 3.6 3.3* 3.5  CL 109 105 103 99 101  CO2 27 29 28 28 27   GLUCOSE 176* 149* 149* 208* 221*  BUN 34* 40* 36* 45* 36*  CREATININE 1.06 1.10 1.10 1.14 1.16  CALCIUM 9.9 10.2 10.3 9.5 9.6  MG -- -- -- 1.9 --  PHOS -- -- -- 4.2 --   Liver Function Tests:  Lab 12/29/12 0500 12/26/12 0445  AST 15 24  ALT 31 56*  ALKPHOS 109 107  BILITOT 0.4 0.2*  PROT 7.4 6.8  ALBUMIN 3.2* 2.8*   CBC:  Lab 12/29/12 0500 12/28/12 0503 12/27/12 0417 12/26/12 0445 12/25/12 0335  WBC 7.6 9.2 10.9* 7.2 6.9  NEUTROABS -- -- -- -- --  HGB 11.0* 11.3* 11.5* 10.1* 10.3*  HCT 36.3* 37.5* 38.4* 33.1* 33.2*  MCV 87.7 87.8 87.3 86.0 86.0  PLT 371 391 351 291 284    Basename 12/15/12 0247 09/10/12 1230 01/18/12 1020  PROBNP 95.3 125.6 68.7   CBG:  Lab 12/29/12 1143 12/29/12 0612 12/29/12 0021 12/28/12 1816  12/28/12 1141  GLUCAP 224* 196* 170* 170* 188*    Studies:  Recent x-ray studies have been reviewed in detail by the Attending Physician  Scheduled Meds:  Reviewed in detail by the Attending Physician   Lonia Blood, MD [facility] Office  [phone] Pager [phone]  On-Call/Text Page:      [email or web address]      password [email or web address]  If 7PM-7AM, please contact night-coverage [email or web address] Password [email or web address] 12/29/2012, 5:42 PM   LOS: 14 days

## 2012-12-29 NOTE — Progress Notes (Signed)
NGT insertion attempted by nursing  staff but pt unable to tolerate.

## 2012-12-29 NOTE — Progress Notes (Signed)
Day shift nurse attempted to insert NG tube, patient unable to tolerate. Coughing and tube was dislodged.  Patient having difficulty controlling secretions since extubation and is non verbal. Spoke to on-call L. [place]wood. Noted to leave NG out and report to dayshift, patient may need PEG placement if feeding will be long term.  Olevia Perches

## 2012-12-30 DIAGNOSIS — E119 Type 2 diabetes mellitus without complications: Secondary | ICD-10-CM

## 2012-12-30 DIAGNOSIS — R1310 Dysphagia, unspecified: Secondary | ICD-10-CM

## 2012-12-30 DIAGNOSIS — K219 Gastro-esophageal reflux disease without esophagitis: Secondary | ICD-10-CM

## 2012-12-30 LAB — GLUCOSE, CAPILLARY

## 2012-12-30 MED ORDER — INSULIN DETEMIR 100 UNIT/ML ~~LOC~~ SOLN
15.0000 [IU] | Freq: Every day | SUBCUTANEOUS | Status: DC
  Filled 2012-12-30: qty 10

## 2012-12-30 MED ORDER — PANTOPRAZOLE SODIUM 40 MG IV SOLR
40.0000 mg | INTRAVENOUS | Status: DC
  Administered 2012-12-30 – 2013-01-01 (×3): 40 mg via INTRAVENOUS
  Filled 2012-12-30 (×4): qty 40

## 2012-12-30 NOTE — Plan of Care (Signed)
Problem: Phase II Progression Outcomes Goal: Tolerating diet Outcome: Not Met (add Reason) Pt continue to drool moderately and unable to control his saliva  .  Cannot swallow effectivelyNeeds frequent oral suctioning.  Needs peg placement  to meet nutritional needs

## 2012-12-30 NOTE — Progress Notes (Signed)
[facility] PROGRESS NOTE  Johnathan Bowman JXB:[id number] DOB: 1928-12-22 DOA: 12/15/2012 PCP: Dorrene German, MD  Assessment/Plan: 1. Acute on chronic ischemia left MCA and PCA territories: Associated with expressive aphasia + severe dysphagia - neurology signed off. Failed swallow study. Patient desired trial of temporary tube feeds. Was unable to tolerate nasogastric tube insertion. Need to discuss with family and patient at bedside goals as PEG tube was declined in the past. For now IV fluids. Continue aspirin.  2. Nutrition: As above. Need to consider PEG tube. Not likely tolerate Panda tube insertion. Other option would be comfort feeds. HCAP: Resolved, completed antibiotics. 3. Loculated malignant L pelural effusion (hx of pleurodesis): PCCM commented that pt may need replacement of Pleurex cath, but was not yet committed to same - pt is in no resp distress at this time, no need for intervention at this time.  4. Acute on chronic resp failure: Due to HCAP + lung CA - resolved for now  5. Metastatic (stage IV) non small cell lung cancer  6. Anemia due to chronic disease: Stable.  7. HTN: Stable without medication. 8. DM: Fair control. Sliding-scale insulin. Increase Levemir to 15 units SQ at bedtime. 9. GERD: Resume PPI.   Code Status: DNR/NO CODE  Family Communication: no family present at time of visit today  Disposition Plan: CIR? Skilled nursing facility? Discuss with family.   Brendia Sacks, MD  [facility] Team 4  Pager [phone] If 7PM-7AM, please contact night-coverage at [email or web address], password [facility] 12/30/2012, 10:30 AM  LOS: 15 days   Brief narrative: 79 year old male with PMH relevant for HTN, DM, dyslipidemia, GERD, CAD, DVT. Diagnosed with metastatic (stage IV) non small cell lung cancer in November 2011 (LUL). Treated since then with chemotherapy. Last cycle given in October 2013. He has history of a left sided malignant pleural effusion (cytology from  01/05/12) and had a Pleurex catheter placed in the past.  He presented to the [facility] ER on 12/28 with a hx of experiencing progressive SOB, productive cough and chills for one week. The day of his admission, he was found by his wife with AMS, some questionable chest pain and worsening respiratory distress. Upon arrival to the ED he was hypoxic and was started on BIPAP. His mental status deteriorated while in the ED and he had to be intubated.  He was treated in the ICU on the PCCM Service until his transfer to a medical bed on 12/27/2012. He completed a course of IV abx for community acquired PNA. He was subsequently extubated on 12/19/2012. His post extubation neurologic exam raised concern for a CNS event (stroke vs/ metastatic disease). Neurology was consulted. Pt required re-intubation on 12/20/2012 due to recurrent acute resp decline due to inability to protect his own airway. An EEG was w/o evidence of Sz. MRI on 12/21/2012 raised suspicion for CVA +/- metastatic disease. Neurology signed off. The family then met with PCCM, and decided to pursue extubation w/ no plans for re-intubation. The decision had been made earlier to not pursue PEG tube. He was extubated for the second time on 12/26/2012. At that time an attempt was made to pass an NG for nutrition, but the pt suffered w/ repeat vomiting, and further attempts were aborted. On 12/27/2012 the pt was transferred out of the Neuro ICU to the Neuro floor.  HPI/Subjective: Afebrile, mild tachycardia. Minimal hypoxia. NG tube could not be tolerated, attempt caused coughing.  Objective: Filed Vitals:   12/29/12 0850 12/29/12 1000 12/29/12 2125  12/30/12 0923  BP:  126/72 132/85   Pulse:  81 110   Temp:  97.7 F (36.5 C) 99.1 F (37.3 C)   TempSrc:  Axillary Oral   Resp:  20 20   Height:      Weight:      SpO2: 88% 95% 95% 95%    Intake/Output Summary (Last 24 hours) at 12/30/12 1030 Last data filed at 12/30/12 0400  Gross per 24 hour  Intake   4820 ml    Output    500 ml  Net   4320 ml   Filed Weights   12/26/12 0451 12/27/12 0400 12/27/12 0500  Weight: 81.5 kg (179 lb 10.8 oz) 78.6 kg (173 lb 4.5 oz) 76.8 kg (169 lb 5 oz)    Exam:  General:  Appears calm and comfortable, expressive aphasia. However able to follow commands, and uses finger to write words. Eyes: PERRL, normal lids, irises  ENT: grossly normal hearing, lips  Cardiovascular: RRR, no m/r/g. No LE edema. Respiratory: CTA bilaterally, no w/r/r. Normal respiratory effort. Abdomen: soft, ntnd Musculoskeletal: grossly normal tone and strength BUE/BLE, moves all extremities to command Psychiatric: Mood and affect appear unremarkable, unable to speak.  Data Reviewed: Basic Metabolic Panel:  Lab 2013-01-14 9528 12/28/12 0503 12/27/12 0417 12/26/12 0445 12/25/12 0335  NA 148* 147* 144 140 140  K 4.1 4.0 3.6 3.3* 3.5  CL 109 105 103 99 101  CO2 27 29 28 28 27   GLUCOSE 176* 149* 149* 208* 221*  BUN 34* 40* 36* 45* 36*  CREATININE 1.06 1.10 1.10 1.14 1.16  CALCIUM 9.9 10.2 10.3 9.5 9.6  MG -- -- -- 1.9 --  PHOS -- -- -- 4.2 --   Liver Function Tests:  Lab 2013/01/14 0500 12/26/12 0445  AST 15 24  ALT 31 56*  ALKPHOS 109 107  BILITOT 0.4 0.2*  PROT 7.4 6.8  ALBUMIN 3.2* 2.8*   CBC:  Lab 2013-01-14 0500 12/28/12 0503 12/27/12 0417 12/26/12 0445 12/25/12 0335  WBC 7.6 9.2 10.9* 7.2 6.9  NEUTROABS -- -- -- -- --  HGB 11.0* 11.3* 11.5* 10.1* 10.3*  HCT 36.3* 37.5* 38.4* 33.1* 33.2*  MCV 87.7 87.8 87.3 86.0 86.0  PLT 371 391 351 291 284   CBG:  Lab 12/30/12 0709 12/30/12 0008 2013/01/14 1739 Jan 14, 2013 1143 01-14-13 0612  GLUCAP 179* 186* 174* 224* 196*   Studies: Mr Lodema Pilot Contrast  01-14-13  *RADIOLOGY REPORT*  Clinical Data: 79 year old male with stage IV lung cancer.  Altered mental status.  Areas of abnormal enhancement in the brain since July 2013 which have been difficult to classify as tumor/metastasis versus post ischemic by MRI.  MRI HEAD WITHOUT AND  WITH CONTRAST  Technique:  Multiplanar, multiecho pulse sequences of the brain and surrounding structures were obtained according to standard protocol without and with intravenous contrast  Contrast: 15mL MULTIHANCE GADOBENATE DIMEGLUMINE 529 MG/ML IV SOLN  Comparison: 12/21/2012 and earlier.  Findings: Medial right cerebellar developmental venous anomaly again incidentally noted.  Chronic left PCA infarct and encephalomalacia.  Gyriform combination intrinsic T1 signal and mild enhancement along the lateral aspect of the left occipital lobe have decreased since earlier this month (series 10 image 28). The left temporal operculum and gyriform enhancement persists and is more discrete and intense, but at the same time slightly less extensive (series 10 image 38). Some of the T1 intensity here reflects blood products which appear mildly increased in a petechial hemorrhage pattern.  As noted before,  the anterior superior left frontal lobe and right cerebellar enhancement first identified in July has decreased and resolved respectively.  No new areas of abnormal enhancement.  Decreased gyriform diffusion abnormality corresponding to the area of left operculum enhancement.  Persistent linear restricted diffusion in the posterior limb of the right internal capsule.  No associated enhancement at the site.  Small cortically based areas of restricted diffusion persist in the lateral left occipital lobe corresponding to the decreasing gyriform enhancement.  No new diffusion abnormality.  No ventriculomegaly. No new intracranial hemorrhage. Major intracranial vascular flow voids are stable.  No new signal abnormality in the brain.  Negative pituitary and visualized cervical spine.  Normal bone marrow signal.  Stable orbits, paranasal sinuses and mastoids.  The patient now is extubated, there is no longer retained fluid in the pharynx. Stable scalp soft tissues.  IMPRESSION: 1.  No new intracranial abnormality. 2.  Complex MRI  findings which I feel over this series of exams are most compatible with recurrent and acute on chronic ischemia which is most pronounced in the left MCA and PCA territories.  Slight interval increase in petechial hemorrhage associated with the left temporal operculum lesion first identified 8 days ago. 3.  No definite intracranial metastatic disease.   Original Report Authenticated By: Erskine Speed, M.D.     Scheduled Meds:   . albuterol  2.5 mg Nebulization QID  . antiseptic oral rinse  15 mL Mouth Rinse QID  . aspirin  300 mg Rectal Daily  . chlorhexidine  15 mL Mouth Rinse BID  . enoxaparin (LOVENOX) injection  40 mg Subcutaneous Q24H  . insulin aspart  0-15 Units Subcutaneous Q6H  . insulin detemir  10 Units Subcutaneous QHS  . ipratropium  0.5 mg Nebulization QID   Continuous Infusions:   . dextrose 5 % and 0.45 % NaCl with KCl 20 mEq/L 100 mL/hr at 12/29/12 1421  . feeding supplement (JEVITY 1.2 CAL)      Principal Problem:  *CVA (cerebral infarction) Active Problems:  ADENOCARCIMONA, LUNG, NONSMALL CELL  Lung cancer  Acute respiratory failure  COPD (chronic obstructive pulmonary disease)  Aphasia  Dysphagia  Diabetes mellitus  GERD (gastroesophageal reflux disease)     Brendia Sacks, MD  [facility] Team 4  Pager [phone] If 7PM-7AM, please contact night-coverage at [email or web address], password [facility] 12/30/2012, 10:30 AM  LOS: 15 days   Time spent: 25 minutes

## 2012-12-30 NOTE — Progress Notes (Signed)
Occupational Therapy Evaluation Patient Details Name: Johnathan Bowman MRN: [id number] DOB: 11/18/1929 Today's Date: 12/30/2012 Time: 4098-1191 OT Time Calculation (min): 36 min  OT Assessment / Plan / Recommendation Clinical Impression  79 yo with hx of stage IV lung CA with recent AMS and SOB. Pt to ED and was intubated. Pt with ischemia in L MCA and PCA territories. Pt presents with severe dysphagia and aphasia in addition to deficits with balance and vision. Pt appears to demonstrate deficits with CN IV and VI during occulomotor assessment, resulting in c/o diplopia.  Balnce deficits most likely exacerbated by diplopia. On eval, pt was communicating that he was hungry. Pt will benefit from skilled OT services to max independence with ADL and functional mobility for ADL. If pt has PEG placed, rec CIR. 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     OT Assessment  Patient needs continued OT Services    Follow Up Recommendations  CIR    Barriers to Discharge Other (comment) (unsure of home environment. Family not present)    Equipment Recommendations  3 in 1 bedside comode;Tub/shower bench    Recommendations for Other Services Rehab consult  Frequency  Min 3X/week    Precautions / Restrictions Precautions Precautions: Fall Precaution Comments: dysphagia aphasia Restrictions Weight Bearing Restrictions: No   Pertinent Vitals/Pain No c/o pain    ADL  Eating/Feeding: NPO Grooming: Minimal assistance Where Assessed - Grooming: Unsupported sitting Upper Body Bathing: Minimal assistance Where Assessed - Upper Body Bathing: Unsupported sitting Lower Body Bathing: Moderate assistance Where Assessed - Lower Body Bathing: Supported sit to stand Upper Body Dressing: Minimal assistance Where Assessed - Upper Body Dressing: Unsupported sitting Lower Body Dressing: Maximal assistance Where Assessed - Lower Body Dressing: Supported sit to stand Toilet Transfer: Minimal  assistance Toilet Transfer Method: Sit to Barista: Other (comment) (bed - chair) Toileting - Clothing Manipulation and Hygiene: Moderate assistance Where Assessed - Toileting Clothing Manipulation and Hygiene: Standing Equipment Used: Gait belt Transfers/Ambulation Related to ADLs: min A ADL Comments: good safety judgement during ADL. Aware of drooling and reaching for towelq    OT Diagnosis:    OT Problem List: Decreased strength;Decreased activity tolerance;Impaired balance (sitting and/or standing);Impaired vision/perception;Decreased knowledge of use of DME or AE;Cardiopulmonary status limiting activity OT Treatment Interventions: Self-care/ADL training;Therapeutic exercise;Neuromuscular education;DME and/or AE instruction;Therapeutic activities;Visual/perceptual remediation/compensation;Patient/family education;Balance training   OT Goals Acute Rehab OT Goals OT Goal Formulation: With patient Time For Goal Achievement: 01/13/13 Potential to Achieve Goals: Good ADL Goals Pt Will Perform Upper Body Bathing: with supervision;with set-up;Unsupported ADL Goal: Upper Body Bathing - Progress: Goal set today Pt Will Perform Lower Body Bathing: with min assist;with supervision;Standing at sink;Supported ADL Goal: Lower Body Bathing - Progress: Goal set today Pt Will Perform Upper Body Dressing: with set-up;with supervision ADL Goal: Upper Body Dressing - Progress: Goal set today Pt Will Perform Lower Body Dressing: with min assist;Supported;Sit to stand from chair ADL Goal: Lower Body Dressing - Progress: Goal set today Pt Will Transfer to Toilet: with supervision;Ambulation;with DME ADL Goal: Toilet Transfer - Progress: Goal set today Miscellaneous OT Goals Miscellaneous OT Goal #1: Pt will use compensatory technique for visual deficits to increase function and safety OT Goal: Miscellaneous Goal #1 - Progress: Goal set today Miscellaneous OT Goal #2: Pt will  complete visual exercises to improve convergence to reduce c/o diplopia OT Goal: Miscellaneous Goal #2 - Progress: Goal set today  Visit Information  Last OT Received On: 12/30/12 Assistance Needed: +  1    Subjective Data   non verbal   Prior Functioning     Home Living Lives With: Spouse Additional Comments: Pt unable to give home environment and PLOF.  Family not present. Prior Function Level of Independence: Independent Able to Take Stairs?: Yes Driving: Yes Vocation: Retired Comments: per pt report Communication Communication: Expressive difficulties Dominant Hand: Right         Vision/Perception Vision - Assessment Eye Alignment: Impaired (comment) Vision Assessment: Vision tested Ocular Range of Motion: Restricted looking up;Impaired-to be further tested in functional context;Restricted on the right Alignment/Gaze Preference: Chin down Tracking/Visual Pursuits: Decreased smoothness of horizontal tracking;Decreased smoothness of vertical tracking;Decreased smoothness of eye movement to LEFT superior field;Decreased smoothness of eye movement to RIGHT superior field;Requires cues, head turns, or add eye shifts to track;Impaired - to be further tested in functional context Saccades: Additional head turns occurred during testing;Additional eye shifts occurred during testing;Impaired - to be further tested in functional context Convergence: Impaired (comment) Visual Fields: Impaired - to be further tested in functional context Diplopia Assessment: Present in near gaze;Other (comment) (TBA) Depth Perception: deficits with spatial use Additional Comments: unable to put "x" on top of letter (apparent CNIV and VI involvement) Perception Perception: Impaired Spatial Orientation: to be further assessed. appears due to visual deficits Praxis Praxis: Intact   Cognition  Overall Cognitive Status: Appears within functional limits for tasks  assessed/performed Arousal/Alertness: Awake/alert Orientation Level: Other (comment) (nonverbal) Behavior During Session: [facility] for tasks performed    Extremity/Trunk Assessment Right Upper Extremity Assessment RUE ROM/Strength/Tone: Deficits (generalized weakness) RUE Sensation: WFL - Light Touch;WFL - Proprioception RUE Coordination: WFL - gross/fine motor Left Upper Extremity Assessment LUE ROM/Strength/Tone: Deficits (generalized weakness) LUE Sensation: WFL - Light Touch;WFL - Proprioception LUE Coordination: WFL - gross/fine motor Trunk Assessment Trunk Assessment: Normal     Mobility Bed Mobility Bed Mobility: Supine to Sit;Sitting - Scoot to Edge of Bed Supine to Sit: HOB elevated;5: Supervision;With rails Sitting - Scoot to Edge of Bed: 5: Supervision;With rail Transfers Transfers: Sit to Stand;Stand to Sit Sit to Stand: 4: Min assist;With upper extremity assist;From bed Stand to Sit: 4: Min assist;With upper extremity assist;To chair/3-in-1 Details for Transfer Assistance: good safety awreness     Shoulder Instructions     Exercise     Balance  min A static standing.   End of Session OT - End of Session Equipment Utilized During Treatment: Gait belt Activity Tolerance: Patient tolerated treatment well Patient left: in chair;with call bell/phone within reach Nurse Communication: Mobility status;Other (comment) (left in chair)  GO     Rich Paprocki,HILLARY 12/30/2012, 6:45 PM [facility], OTR/L  [phone] 12/30/2012

## 2012-12-31 LAB — BASIC METABOLIC PANEL
Chloride: 108 mEq/L (ref 96–112)
GFR calc non Af Amer: 68 mL/min — ABNORMAL LOW (ref >90)
Glucose, Bld: 213 mg/dL — ABNORMAL HIGH (ref 70–99)
Potassium: 4.2 mEq/L (ref 3.5–5.1)
Sodium: 141 mEq/L (ref 135–145)

## 2012-12-31 LAB — GLUCOSE, CAPILLARY
Glucose-Capillary: 201 mg/dL — ABNORMAL HIGH (ref 70–99)
Glucose-Capillary: 145 mg/dL — ABNORMAL HIGH (ref 70–99)

## 2012-12-31 MED ORDER — VANCOMYCIN HCL 10 G IV SOLR
1500.0000 mg | Freq: Once | INTRAVENOUS | Status: AC
  Administered 2013-01-01: 1500 mg via INTRAVENOUS
  Filled 2012-12-31: qty 1500

## 2012-12-31 MED ORDER — INSULIN DETEMIR 100 UNIT/ML ~~LOC~~ SOLN
20.0000 [IU] | Freq: Every day | SUBCUTANEOUS | Status: DC
  Administered 2012-12-31: 20 [IU] via SUBCUTANEOUS

## 2012-12-31 NOTE — Consult Note (Signed)
Physical Medicine and Rehabilitation Consult Reason for Consult:stroke Referring Physician: Oti   HPI: Johnathan Bowman is a 79 y.o. male with history of DM, stage IV metastatic lung cancer with history of malignant pleural effusion requiring Pleurex cath in the past; admitted on 12/15/12 with AMS changes with progressive SOB and chills. He required intubation in ED due to hypoxia and metabolic acidosis. He was started on antibiotics for HCAP. CT head negative. CXR with loculated malignant left effusion--being monitored as patient with improvement. Patient with mutism and facial weakness. Neurology consulted and MRI of brain done showing  Suspicion of acute/subacute infarct in right internal capsule-tumor not entirely excluded. Repeat MRI in 7 days recommended.  EEG with mild slowing and question of metabolic encephalopathy. Goals of care discussed with family and no PEG/no trach decided. Extubated on 01/08. BSS done and NPO recommended. Patient unable to tolerate NGT but indicating hunger. He continues to have difficulty handling oral secretions needing frequent suctioning. Family has decided on placing PEG and to be done by IVR.  Repeat MRI brain on 01/11 reveals recurrent acute on chronic ischemia most pronounced in left MCA and PCA territories with slight increase in petechial hemorrhage associated with left temporal operculum lesion.    Therapy evaluations done and patient limited by balance deficits as well as diplopia, generalized weakness and slow processing.   ROS: Some sob and cough. Occasional lethargy and fatigue.  A 12 point review of systems has been performed and if not noted above is otherwise negative.  Past Medical History  Diagnosis Date  . Hypertension   . Diabetes mellitus   . Dyslipidemia   . Mini stroke 2005  . Degenerative disc disease   . GERD (gastroesophageal reflux disease)   . Osteopenia 2005  . Coronary artery disease 2005    treated medically  . BPH (benign  prostatic hyperplasia)   . DVT (deep venous thrombosis)   . History of chemotherapy   . Diverticulosis     per scan 05/28/12  . Calculus of kidney     per scan 05/28/12  . Hx of radiation therapy 06/14/12 -07/05/12    left chest  . lung ca 11/12/2010    LUL   Past Surgical History  Procedure Date  . No past surgeries   . Pleurex cath 02/08/11    left-sided pleural effusion   History reviewed. No pertinent family history. Social History:  reports that he quit smoking about 26 years ago. He has never used smokeless tobacco. He reports that he does not drink alcohol or use illicit drugs. Allergies:  Allergies  Allergen Reactions  . Penicillins Other (See Comments)    Unknown childhood allergy   Medications Prior to Admission  Medication Sig Dispense Refill  . PRESCRIPTION MEDICATION Chemo Medication:  Taxotere  Physician is Dr. Tanja Port.      Marland Kitchen acetaminophen (TYLENOL) 500 MG tablet Take 500 mg by mouth every 6 (six) hours as needed. For pain.      Marland Kitchen albuterol (PROVENTIL) (2.5 MG/3ML) 0.083% nebulizer solution Take 3 mLs (2.5 mg total) by nebulization every 6 (six) hours as needed for wheezing.  75 mL  12  . amLODipine-benazepril (LOTREL) 5-10 MG per capsule Take 1 capsule by mouth daily.        Marland Kitchen aspirin 81 MG tablet Take 81 mg by mouth daily.       Marland Kitchen dexamethasone (DECADRON) 4 MG tablet Take 4 mg by mouth 2 (two) times daily with a meal. 2 tabs by  mouth BID, the day before, the day of and the day after chemotherapy      . glyBURIDE-metformin (GLUCOVANCE) 5-500 MG per tablet Take 2 tablets by mouth 2 (two) times daily.        Marland Kitchen HYDROcodone-acetaminophen (VICODIN) 5-500 MG per tablet Take 1-2 tablets by mouth every 6 (six) hours as needed for pain. For pain.  60 tablet  0  . hydrOXYzine (ATARAX/VISTARIL) 25 MG tablet Take 1 tablet (25 mg total) by mouth every 4 (four) hours as needed for itching. Take 1 tablet by mouth every 8 hours as needed for itching  60 tablet  0  . hydrOXYzine  (ATARAX/VISTARIL) 25 MG tablet TAKE 1 TABLET BY MOUTH EVERY 8 HOURS AS NEEDED FOR ITCHING  30 tablet  0  . lidocaine-prilocaine (EMLA) cream Apply 1 application topically as needed. Apply to [facility] A Cath site before chemotherapy.       Marland Kitchen morphine (MS CONTIN) 30 MG 12 hr tablet Take 1 tablet (30 mg total) by mouth 2 (two) times daily.  60 tablet  0  . pantoprazole (PROTONIX) 40 MG tablet Take 1 tablet (40 mg total) by mouth 2 (two) times daily.  30 tablet  3    Home: Home Living Lives With: Spouse Additional Comments: Pt unable to give home environment and PLOF.  Family not present.  Functional History: Prior Function Able to Take Stairs?: Yes Driving: Yes Vocation: Retired Comments: per pt report Functional Status:  Mobility: Bed Mobility Bed Mobility: Supine to Sit;Sitting - Scoot to Edge of Bed Supine to Sit: HOB elevated;5: Supervision;With rails Sitting - Scoot to Edge of Bed: 5: Supervision;With rail Transfers Transfers: Sit to Stand;Stand to Sit Sit to Stand: 4: Min assist;With upper extremity assist;From bed Sit to Stand: Patient Percentage: 70% Stand to Sit: 4: Min assist;With upper extremity assist;To chair/3-in-1 Stand to Sit: Patient Percentage: 70% Stand Pivot Transfers: 1: +2 Total assist Stand Pivot Transfers: Patient Percentage: 70% Ambulation/Gait Ambulation/Gait Assistance: 1: +2 Total assist Ambulation/Gait: Patient Percentage: 60% Ambulation Distance (Feet): 30 Feet Assistive device: 2 person hand held assist Ambulation/Gait Assistance Details: flexed posture; base of support varies between very narrow (nearly scissoring) to wide, staggering; staggers to his Rt; ? related to double vision    ADL: ADL Eating/Feeding: NPO Grooming: Minimal assistance Where Assessed - Grooming: Unsupported sitting Upper Body Bathing: Minimal assistance Where Assessed - Upper Body Bathing: Unsupported sitting Lower Body Bathing: Moderate assistance Where Assessed -  Lower Body Bathing: Supported sit to stand Upper Body Dressing: Minimal assistance Where Assessed - Upper Body Dressing: Unsupported sitting Lower Body Dressing: Maximal assistance Where Assessed - Lower Body Dressing: Supported sit to stand Toilet Transfer: Minimal assistance Toilet Transfer Method: Sit to Barista: Other (comment) (bed - chair) Equipment Used: Gait belt Transfers/Ambulation Related to ADLs: min A ADL Comments: good safety judgement during ADL. Aware of drooling and reaching for towelq  Cognition: Cognition Overall Cognitive Status: Impaired Arousal/Alertness: Awake/alert Orientation Level: Other (comment) (UTA-pt cannot speak) Attention: Focused;Sustained;Selective Focused Attention: Appears intact Sustained Attention: Appears intact Memory:  (UTA) Awareness: Impaired Awareness Impairment: Anticipatory impairment;Emergent impairment (continues to try and write though mostly unintelligible) Problem Solving: Impaired Problem Solving Impairment:  (Did not asses due to arrival of PT, TBD) Behaviors: Impulsive Safety/Judgment: Impaired Cognition Overall Cognitive Status: Appears within functional limits for tasks assessed/performed Area of Impairment: Problem solving Arousal/Alertness: Awake/alert Orientation Level: Other (comment) (nonverbal) Behavior During Session: [facility] for tasks performed Problem Solving: following all commands; very  patient as writing words and therapist trying to identify his letters (poorly written)  Blood pressure 129/58, pulse 90, temperature 97.7 F (36.5 C), temperature source Oral, resp. rate 18, height 5\' 7"  (1.702 m), weight 76.8 kg (169 lb 5 oz), SpO2 95.00%. \  PHYSICAL EXAM:  General: slow to arouse, No apparent distress HEENT: Head is normocephalic, atraumatic, PERRLA, EOMI, sclera anicteric, oral mucosa pink and moist, dentition intact, ext ear canals clear,  Neck: Supple without JVD or  lymphadenopathy Heart: Reg rate and rhythm. No murmurs rubs or gallops Chest: CTA bilaterally without wheezes, rales, occasional rhonchi; no distress. Getting breathing treatment Abdomen: Soft, non-tender, non-distended, bowel sounds positive. Extremities: No clubbing, cyanosis, or edema. Pulses are 2+ Skin: Clean and intact without signs of breakdown Neuro: pt with with generalized weakness. Decreased FMC RUE.  MOTOR TESTING:            DELT  EF   EE   WE   H I   HF   KF  KE   ADF   APF Right:   3-     3   3     3    3      3    2-    2      2            3        3  Left:     3-4 throughout  Speech low volume, weak cough. Able tofollow simple one step commands. Withdraws to pain on the right   Musculoskeletal: Full ROM, No pain with AROM or PROM in the neck, trunk, or extremities. Posture appropriate Psych: Pt's affect is appropriate. Pt is cooperative    Results for orders placed during the hospital encounter of 12/15/12 (from the past 24 hour(s))  GLUCOSE, CAPILLARY     Status: Abnormal   Collection Time   12/30/12  6:22 PM      Component Value Range   Glucose-Capillary 147 (*) 70 - 99 mg/dL  GLUCOSE, CAPILLARY     Status: Abnormal   Collection Time   12/31/12 12:24 AM      Component Value Range   Glucose-Capillary 170 (*) 70 - 99 mg/dL   Comment 1 Documented in Chart     Comment 2 Notify RN    BASIC METABOLIC PANEL     Status: Abnormal   Collection Time   12/31/12  6:25 AM      Component Value Range   Sodium 141  135 - 145 mEq/L   Potassium 4.2  3.5 - 5.1 mEq/L   Chloride 108  96 - 112 mEq/L   CO2 24  19 - 32 mEq/L   Glucose, Bld 213 (*) 70 - 99 mg/dL   BUN 13  6 - 23 mg/dL   Creatinine, Ser 1.19  0.50 - 1.35 mg/dL   Calcium 9.6  8.4 - 14.7 mg/dL   GFR calc non Af Amer 68 (*) >90 mL/min   GFR calc Af Amer 78 (*) >90 mL/min  GLUCOSE, CAPILLARY     Status: Abnormal   Collection Time   12/31/12  6:38 AM      Component Value Range   Glucose-Capillary 201 (*) 70 - 99 mg/dL    Comment 1 Documented in Chart     Comment 2 Notify RN    GLUCOSE, CAPILLARY     Status: Abnormal   Collection Time   12/31/12 12:12 PM      Component  Value Range   Glucose-Capillary 146 (*) 70 - 99 mg/dL   Comment 1 Notify RN     No results found.  Assessment/Plan: Diagnosis: left MCA,PCA infarcts 1. Does the need for close, 24 hr/day medical supervision in concert with the patient's rehab needs make it unreasonable for this patient to be served in a less intensive setting? Yes 2. Co-Morbidities requiring supervision/potential complications: htn, dm, ddd, ?cancer, cad 3. Due to bladder management, bowel management, safety, skin/wound care, disease management, medication administration, pain management and patient education, does the patient require 24 hr/day rehab nursing? Yes 4. Does the patient require coordinated care of a physician, rehab nurse, PT (1-2 hrs/day, 5 days/week), OT (1-2 hrs/day, 5 days/week) and SLP (1-2 hrs/day, 5 days/week) to address physical and functional deficits in the context of the above medical diagnosis(es)? Yes Addressing deficits in the following areas: balance, endurance, locomotion, strength, transferring, bowel/bladder control, bathing, dressing, feeding, grooming, toileting, cognition, speech, language, swallowing and psychosocial support 5. Can the patient actively participate in an intensive therapy program of at least 3 hrs of therapy per day at least 5 days per week? Yes 6. The potential for patient to make measurable gains while on inpatient rehab is good 7. Anticipated functional outcomes upon discharge from inpatient rehab are supervision to min assist with PT, supervision to min assist  with OT, supervision to min assist with SLP. 8. Estimated rehab length of stay to reach the above functional goals is: 2 weeks 9. Does the patient have adequate social supports to accommodate these discharge functional goals? Potentially 10. Anticipated D/C setting:  Home 11. Anticipated post D/C treatments: Outpt therapy 12. Overall Rehab/Functional Prognosis: good  RECOMMENDATIONS: This patient's condition is appropriate for continued rehabilitative care in the following setting: CIR Patient has agreed to participate in recommended program. Yes Note that insurance prior authorization may be required for reimbursement for recommended care.  Comment: Rehab RN to follow up.   Ivory Broad, MD    12/31/2012

## 2012-12-31 NOTE — Progress Notes (Signed)
PT Cancellation Note  Patient Details Name: Johnathan Bowman MRN: [id number] DOB: 1929-05-31   Cancelled Treatment:    Reason Eval/Treat Not Completed:  Patient lethargic upon arrival. Easily awoke however communicated via hand writing for PT to come back in morning. Via head nodding pt reported yes to being extremely tired and no for returning to bed. Pt left up in chair and RN notified. PT to re-attempt as able.   Marcene Brawn 12/31/2012, 3:38 PM  Lewis Shock, PT, DPT Pager #: [phone] Office #: [phone]

## 2012-12-31 NOTE — Progress Notes (Signed)
Speech Language Pathology Treatment Patient Details Name: Johnathan Bowman MRN: [id number] DOB: 29-Aug-1929 Today's Date: 12/31/2012 Time: 9147-8295 SLP Time Calculation (min): 25 min  Assessment / Plan / Recommendation Clinical Impression  Pt attempts to communicate via gestures and writing. He continues nonverbal, maintaining open mouth posture almost consistently. Pt appears to understand simple conversation and can follow directions.     SLP Plan  Continue with current plan of care    Pertinent Vitals/Pain None indicated  SLP Goals  SLP Goals SLP Goal #1 - Progress: Progressing toward goal SLP Goal #2 - Progress: Progressing toward goal SLP Goal #3 - Progress: Progressing toward goal  General Temperature Spikes Noted: No Respiratory Status: Room air Behavior/Cognition: Alert;Cooperative;Pleasant mood Oral Cavity - Dentition: Edentulous Patient Positioning: Upright in chair  Oral Cavity - Oral Hygiene Does patient have any of the following "at risk" factors?: Other - dysphagia Brush patient's teeth BID with toothbrush (using toothpaste with fluoride): Yes Patient is AT RISK - Oral Care Protocol followed (see row info): Yes   Treatment Treatment focused on: Aphasia Skilled Treatment: Pt appears to understand basic sentences and is able to follow commands. He uses gestures to facilitate expression of wants/needs.  Pt attempts to use written language also, however, this requires a great deal of interpretation on SLP part, due to errors. Pt was provided with alphabet to further facilitate effective nonverbal communication.   Donshay Lupinski B. Murvin Natal [facility], CCC-SLP [phone] Leigh Aurora 12/31/2012, 3:02 PM

## 2012-12-31 NOTE — H&P (View-Only) (Signed)
[facility] PROGRESS NOTE  ROMELO SCIANDRA ZOX:[id number] DOB: Jun 23, 1929 DOA: 12/15/2012 PCP: Dorrene German, MD  Assessment/Plan: 1. Acute on chronic ischemia left MCA and PCA territories: Associated with expressive aphasia + severe dysphagia - neurology signed off. Failed swallow study. Patient desired trial of temporary tube feeds. Was unable to tolerate nasogastric tube insertion. Discussed with family and patient at bedside, all desire to pursue PEG tube. Continue aspirin.  2. Nutrition: As above. PEG tube. 3. HCAP: Resolved, completed antibiotics. 4. Loculated malignant L pelural effusion (hx of pleurodesis): PCCM commented that pt may need replacement of Pleurex cath, but was not yet committed to same - pt is in no resp distress at this time, no need for intervention at this time.  5. Acute on chronic resp failure: Due to HCAP + lung CA - resolved for now  6. Metastatic (stage IV) non small cell lung cancer  7. Anemia due to chronic disease: Stable.  8. HTN: Stable without medication. 9. DM: Fair control. Sliding-scale insulin. Increase Levemir to 20 units SQ at bedtime. 10. GERD: Resume PPI.   Discussed with patient, wife and brother-in-law bedside. They would like to pursue PEG tube placement for nutrition. Other options including comfort feeds. Has not been able to tolerate temporary tube insertion.   Code Status: DNR/NO CODE  Family Communication: As above Disposition Plan: CIR?    Brendia Sacks, MD  [facility] Team 4  Pager [phone] If 7PM-7AM, please contact night-coverage at [email or web address], password [facility] 12/31/2012, 11:36 AM  LOS: 16 days   Brief narrative: 79 year old male with PMH relevant for HTN, DM, dyslipidemia, GERD, CAD, DVT. Diagnosed with metastatic (stage IV) non small cell lung cancer in November 2011 (LUL). Treated since then with chemotherapy. Last cycle given in October 2013. He has history of a left sided malignant pleural effusion  (cytology from 01/05/12) and had a Pleurex catheter placed in the past.  He presented to the [facility] ER on 12/28 with a hx of experiencing progressive SOB, productive cough and chills for one week. The day of his admission, he was found by his wife with AMS, some questionable chest pain and worsening respiratory distress. Upon arrival to the ED he was hypoxic and was started on BIPAP. His mental status deteriorated while in the ED and he had to be intubated.  He was treated in the ICU on the PCCM Service until his transfer to a medical bed on 12/27/2012. He completed a course of IV abx for community acquired PNA. He was subsequently extubated on 12/19/2012. His post extubation neurologic exam raised concern for a CNS event (stroke vs/ metastatic disease). Neurology was consulted. Pt required re-intubation on 12/20/2012 due to recurrent acute resp decline due to inability to protect his own airway. An EEG was w/o evidence of Sz. MRI on 12/21/2012 raised suspicion for CVA +/- metastatic disease. Neurology signed off. The family then met with PCCM, and decided to pursue extubation w/ no plans for re-intubation. The decision had been made earlier to not pursue PEG tube. He was extubated for the second time on 12/26/2012. At that time an attempt was made to pass an NG for nutrition, but the pt suffered w/ repeat vomiting, and further attempts were aborted. On 12/27/2012 the pt was transferred out of the Neuro ICU to the Neuro floor.  HPI/Subjective: Afebrile, vital signs stable. Continued difficulty with secretions. Occupational therapy recommend CIR. Communication via writing with pen.  Objective: Filed Vitals:   12/30/12 2059 12/30/12 2116 12/31/12  0528 12/31/12 0843  BP:  133/75 124/71   Pulse:  104 96   Temp:  97.4 F (36.3 C) 98.2 F (36.8 C)   TempSrc:  Oral Oral   Resp:  20 20   Height:      Weight:      SpO2: 96% 97% 96% 95%    Intake/Output Summary (Last 24 hours) at 12/31/12 1136 Last data filed at  12/31/12 0800  Gross per 24 hour  Intake   1200 ml  Output    300 ml  Net    900 ml   Filed Weights   12/26/12 0451 12/27/12 0400 12/27/12 0500  Weight: 81.5 kg (179 lb 10.8 oz) 78.6 kg (173 lb 4.5 oz) 76.8 kg (169 lb 5 oz)    Exam:  General:  Appears calm and comfortable sitting in chair. However able to follow commands, write using a pen. ENT: grossly normal hearing Cardiovascular: RRR, no m/r/g. No LE edema. Respiratory: CTA bilaterally, no w/r/r. Normal respiratory effort. Musculoskeletal: grossly normal tone and strength BUE/BLE, moves all extremities to command Psychiatric: Mood and affect appear unremarkable, unable to speak.  Data Reviewed: Basic Metabolic Panel:  Lab 12/31/12 1610 01-27-2013 0500 12/28/12 0503 12/27/12 0417 12/26/12 0445  NA 141 148* 147* 144 140  K 4.2 4.1 4.0 3.6 3.3*  CL 108 109 105 103 99  CO2 24 27 29 28 28   GLUCOSE 213* 176* 149* 149* 208*  BUN 13 34* 40* 36* 45*  CREATININE 1.01 1.06 1.10 1.10 1.14  CALCIUM 9.6 9.9 10.2 10.3 9.5  MG -- -- -- -- 1.9  PHOS -- -- -- -- 4.2   Liver Function Tests:  Lab 01-27-2013 0500 12/26/12 0445  AST 15 24  ALT 31 56*  ALKPHOS 109 107  BILITOT 0.4 0.2*  PROT 7.4 6.8  ALBUMIN 3.2* 2.8*   CBC:  Lab 01-27-13 0500 12/28/12 0503 12/27/12 0417 12/26/12 0445 12/25/12 0335  WBC 7.6 9.2 10.9* 7.2 6.9  NEUTROABS -- -- -- -- --  HGB 11.0* 11.3* 11.5* 10.1* 10.3*  HCT 36.3* 37.5* 38.4* 33.1* 33.2*  MCV 87.7 87.8 87.3 86.0 86.0  PLT 371 391 351 291 284   CBG:  Lab 12/31/12 0638 12/31/12 0024 12/30/12 1822 12/30/12 1149 12/30/12 0709  GLUCAP 201* 170* 147* 212* 179*   Studies: Mr Lodema Pilot Contrast  27-Jan-2013  *RADIOLOGY REPORT*  Clinical Data: 79 year old male with stage IV lung cancer.  Altered mental status.  Areas of abnormal enhancement in the brain since July 2013 which have been difficult to classify as tumor/metastasis versus post ischemic by MRI.  MRI HEAD WITHOUT AND WITH CONTRAST  Technique:   Multiplanar, multiecho pulse sequences of the brain and surrounding structures were obtained according to standard protocol without and with intravenous contrast  Contrast: 15mL MULTIHANCE GADOBENATE DIMEGLUMINE 529 MG/ML IV SOLN  Comparison: 12/21/2012 and earlier.  Findings: Medial right cerebellar developmental venous anomaly again incidentally noted.  Chronic left PCA infarct and encephalomalacia.  Gyriform combination intrinsic T1 signal and mild enhancement along the lateral aspect of the left occipital lobe have decreased since earlier this month (series 10 image 28). The left temporal operculum and gyriform enhancement persists and is more discrete and intense, but at the same time slightly less extensive (series 10 image 38). Some of the T1 intensity here reflects blood products which appear mildly increased in a petechial hemorrhage pattern.  As noted before, the anterior superior left frontal lobe and right cerebellar enhancement first  identified in July has decreased and resolved respectively.  No new areas of abnormal enhancement.  Decreased gyriform diffusion abnormality corresponding to the area of left operculum enhancement.  Persistent linear restricted diffusion in the posterior limb of the right internal capsule.  No associated enhancement at the site.  Small cortically based areas of restricted diffusion persist in the lateral left occipital lobe corresponding to the decreasing gyriform enhancement.  No new diffusion abnormality.  No ventriculomegaly. No new intracranial hemorrhage. Major intracranial vascular flow voids are stable.  No new signal abnormality in the brain.  Negative pituitary and visualized cervical spine.  Normal bone marrow signal.  Stable orbits, paranasal sinuses and mastoids.  The patient now is extubated, there is no longer retained fluid in the pharynx. Stable scalp soft tissues.  IMPRESSION: 1.  No new intracranial abnormality. 2.  Complex MRI findings which I feel over  this series of exams are most compatible with recurrent and acute on chronic ischemia which is most pronounced in the left MCA and PCA territories.  Slight interval increase in petechial hemorrhage associated with the left temporal operculum lesion first identified 8 days ago. 3.  No definite intracranial metastatic disease.   Original Report Authenticated By: Erskine Speed, M.D.     Scheduled Meds:    . albuterol  2.5 mg Nebulization QID  . antiseptic oral rinse  15 mL Mouth Rinse QID  . aspirin  300 mg Rectal Daily  . chlorhexidine  15 mL Mouth Rinse BID  . enoxaparin (LOVENOX) injection  40 mg Subcutaneous Q24H  . insulin aspart  0-15 Units Subcutaneous Q6H  . insulin detemir  15 Units Subcutaneous QHS  . ipratropium  0.5 mg Nebulization QID  . pantoprazole (PROTONIX) IV  40 mg Intravenous Q24H   Continuous Infusions:    . dextrose 5 % and 0.45 % NaCl with KCl 20 mEq/L 100 mL/hr (12/31/12 0941)    Principal Problem:  *CVA (cerebral infarction) Active Problems:  ADENOCARCIMONA, LUNG, NONSMALL CELL  Lung cancer  Acute respiratory failure  COPD (chronic obstructive pulmonary disease)  Aphasia  Dysphagia  Diabetes mellitus  GERD (gastroesophageal reflux disease)     Brendia Sacks, MD  [facility] Team 4  Pager [phone] If 7PM-7AM, please contact night-coverage at [email or web address], password [facility] 12/31/2012, 11:36 AM  LOS: 16 days   Time spent: 35 minutes greater than 50% in counseling and coordination of care

## 2012-12-31 NOTE — Progress Notes (Signed)
Speech Language Pathology Dysphagia Treatment Patient Details Name: Johnathan Bowman MRN: [id number] DOB: [birth year] Today's Date: 12/31/2012 Time: 1400-1420 SLP Time Calculation (min): 20 min  Assessment / Plan / Recommendation Clinical Impression  Pt continues to exhibit significant dysphagia, characterized primarily by poor oral control and poor secretion management.  Oral care was provided, using suction. Pt maintains open mouth posture at almost all times. He is able to cough/clear throat to command, but is unable to bring secretions up to the oral cavity.  Pt is scheduled for PEG placement tomorrow. ST to continue to follow.     Diet Recommendation  Continue with Current Diet: NPO    SLP Plan Continue with current plan of care   Pertinent Vitals/Pain None indicated   Swallowing Goals  SLP Swallowing Goals Swallow Study Goal #3 - Progress: Progressing toward goal  General Temperature Spikes Noted: No Respiratory Status: Room air Behavior/Cognition: Alert;Cooperative;Pleasant mood Oral Cavity - Dentition: Edentulous Patient Positioning: Upright in chair  Oral Cavity - Oral Hygiene Does patient have any of the following "at risk" factors?: Other - dysphagia Brush patient's teeth BID with toothbrush (using toothpaste with fluoride): Yes Patient is AT RISK - Oral Care Protocol followed (see row info): Yes   Dysphagia Treatment Treatment focused on: Patient/family/caregiver education;Facilitation of oral preparatory phase Treatment Methods/Modalities: Skilled observation;Differential diagnosis Patient observed directly with PO's: No Reason PO's not observed: Other (comment) (poor secretion management)   Gwenna Fuston B. Murvin [facility], CCC-SLP [phone] Leigh Aurora 12/31/2012, 2:52 PM

## 2012-12-31 NOTE — Progress Notes (Signed)
[facility] PROGRESS NOTE  Shad W Keathley MRN:[id number] DOB: 03/28/1929 DOA: 12/15/2012 PCP: AVBUERE,EDWIN A, MD  Assessment/Plan: 1. Acute on chronic ischemia left MCA and PCA territories: Associated with expressive aphasia + severe dysphagia - neurology signed off. Failed swallow study. Patient desired trial of temporary tube feeds. Was unable to tolerate nasogastric tube insertion. Discussed with family and patient at bedside, all desire to pursue PEG tube. Continue aspirin.  2. Nutrition: As above. PEG tube. 3. HCAP: Resolved, completed antibiotics. 4. Loculated malignant L pelural effusion (hx of pleurodesis): PCCM commented that pt may need replacement of Pleurex cath, but was not yet committed to same - pt is in no resp distress at this time, no need for intervention at this time.  5. Acute on chronic resp failure: Due to HCAP + lung CA - resolved for now  6. Metastatic (stage IV) non small cell lung cancer  7. Anemia due to chronic disease: Stable.  8. HTN: Stable without medication. 9. DM: Fair control. Sliding-scale insulin. Increase Levemir to 20 units SQ at bedtime. 10. GERD: Resume PPI.   Discussed with patient, wife and brother-in-law bedside. They would like to pursue PEG tube placement for nutrition. Other options including comfort feeds. Has not been able to tolerate temporary tube insertion.   Code Status: DNR/NO CODE  Family Communication: As above Disposition Plan: CIR?    Elfriede Bonini, MD  [facility] Team 4  Pager [phone] If 7PM-7AM, please contact night-coverage at [email or web address], password [email or web address] 12/31/2012, 11:36 AM  LOS: 16 days   Brief narrative: 79 year old male with PMH relevant for HTN, DM, dyslipidemia, GERD, CAD, DVT. Diagnosed with metastatic (stage IV) non small cell lung cancer in November 2011 (LUL). Treated since then with chemotherapy. Last cycle given in October 2013. He has history of a left sided malignant pleural effusion  (cytology from 01/05/12) and had a Pleurex catheter placed in the past.  He presented to the [facility] on 12/28 with a hx of experiencing progressive SOB, productive cough and chills for one week. The day of his admission, he was found by his wife with AMS, some questionable chest pain and worsening respiratory distress. Upon arrival to the ED he was hypoxic and was started on BIPAP. His mental status deteriorated while in the ED and he had to be intubated.  He was treated in the ICU on the PCCM Service until his transfer to a medical bed on 12/27/2012. He completed a course of IV abx for community acquired PNA. He was subsequently extubated on 12/19/2012. His post extubation neurologic exam raised concern for a CNS event (stroke vs/ metastatic disease). Neurology was consulted. Pt required re-intubation on 12/20/2012 due to recurrent acute resp decline due to inability to protect his own airway. An EEG was w/o evidence of Sz. MRI on 12/21/2012 raised suspicion for CVA +/- metastatic disease. Neurology signed off. The family then met with PCCM, and decided to pursue extubation w/ no plans for re-intubation. The decision had been made earlier to not pursue PEG tube. He was extubated for the second time on 12/26/2012. At that time an attempt was made to pass an NG for nutrition, but the pt suffered w/ repeat vomiting, and further attempts were aborted. On 12/27/2012 the pt was transferred out of the Neuro ICU to the Neuro floor.  HPI/Subjective: Afebrile, vital signs stable. Continued difficulty with secretions. Occupational therapy recommend CIR. Communication via writing with pen.  Objective: Filed Vitals:   12/30/12 2059 12/30/12 2116 12/31/12   0528 12/31/12 0843  BP:  133/75 124/71   Pulse:  104 96   Temp:  97.4 F (36.3 C) 98.2 F (36.8 C)   TempSrc:  Oral Oral   Resp:  20 20   Height:      Weight:      SpO2: 96% 97% 96% 95%    Intake/Output Summary (Last 24 hours) at 12/31/12 1136 Last data filed at  12/31/12 0800  Gross per 24 hour  Intake   1200 ml  Output    300 ml  Net    900 ml   Filed Weights   12/26/12 0451 12/27/12 0400 12/27/12 0500  Weight: 81.5 kg (179 lb 10.8 oz) 78.6 kg (173 lb 4.5 oz) 76.8 kg (169 lb 5 oz)    Exam:  General:  Appears calm and comfortable sitting in chair. However able to follow commands, write using a pen. ENT: grossly normal hearing Cardiovascular: RRR, no m/r/g. No LE edema. Respiratory: CTA bilaterally, no w/r/r. Normal respiratory effort. Musculoskeletal: grossly normal tone and strength BUE/BLE, moves all extremities to command Psychiatric: Mood and affect appear unremarkable, unable to speak.  Data Reviewed: Basic Metabolic Panel:  Lab 12/31/12 0625 12/29/12 0500 12/28/12 0503 12/27/12 0417 12/26/12 0445  NA 141 148* 147* 144 140  K 4.2 4.1 4.0 3.6 3.3*  CL 108 109 105 103 99  CO2 24 27 29 28 28  GLUCOSE 213* 176* 149* 149* 208*  BUN 13 34* 40* 36* 45*  CREATININE 1.01 1.06 1.10 1.10 1.14  CALCIUM 9.6 9.9 10.2 10.3 9.5  MG -- -- -- -- 1.9  PHOS -- -- -- -- 4.2   Liver Function Tests:  Lab 12/29/12 0500 12/26/12 0445  AST 15 24  ALT 31 56*  ALKPHOS 109 107  BILITOT 0.4 0.2*  PROT 7.4 6.8  ALBUMIN 3.2* 2.8*   CBC:  Lab 12/29/12 0500 12/28/12 0503 12/27/12 0417 12/26/12 0445 12/25/12 0335  WBC 7.6 9.2 10.9* 7.2 6.9  NEUTROABS -- -- -- -- --  HGB 11.0* 11.3* 11.5* 10.1* 10.3*  HCT 36.3* 37.5* 38.4* 33.1* 33.2*  MCV 87.7 87.8 87.3 86.0 86.0  PLT 371 391 351 291 284   CBG:  Lab 12/31/12 0638 12/31/12 0024 12/30/12 1822 12/30/12 1149 12/30/12 0709  GLUCAP 201* 170* 147* 212* 179*   Studies: Mr Brain W Wo Contrast  12/29/2012  *RADIOLOGY REPORT*  Clinical Data: 79-year-old male with stage IV lung cancer.  Altered mental status.  Areas of abnormal enhancement in the brain since July 2013 which have been difficult to classify as tumor/metastasis versus post ischemic by MRI.  MRI HEAD WITHOUT AND WITH CONTRAST  Technique:   Multiplanar, multiecho pulse sequences of the brain and surrounding structures were obtained according to standard protocol without and with intravenous contrast  Contrast: 15mL MULTIHANCE GADOBENATE DIMEGLUMINE 529 MG/ML IV SOLN  Comparison: 12/21/2012 and earlier.  Findings: Medial right cerebellar developmental venous anomaly again incidentally noted.  Chronic left PCA infarct and encephalomalacia.  Gyriform combination intrinsic T1 signal and mild enhancement along the lateral aspect of the left occipital lobe have decreased since earlier this month (series 10 image 28). The left temporal operculum and gyriform enhancement persists and is more discrete and intense, but at the same time slightly less extensive (series 10 image 38). Some of the T1 intensity here reflects blood products which appear mildly increased in a petechial hemorrhage pattern.  As noted before, the anterior superior left frontal lobe and right cerebellar enhancement first   identified in July has decreased and resolved respectively.  No new areas of abnormal enhancement.  Decreased gyriform diffusion abnormality corresponding to the area of left operculum enhancement.  Persistent linear restricted diffusion in the posterior limb of the right internal capsule.  No associated enhancement at the site.  Small cortically based areas of restricted diffusion persist in the lateral left occipital lobe corresponding to the decreasing gyriform enhancement.  No new diffusion abnormality.  No ventriculomegaly. No new intracranial hemorrhage. Major intracranial vascular flow voids are stable.  No new signal abnormality in the brain.  Negative pituitary and visualized cervical spine.  Normal bone marrow signal.  Stable orbits, paranasal sinuses and mastoids.  The patient now is extubated, there is no longer retained fluid in the pharynx. Stable scalp soft tissues.  IMPRESSION: 1.  No new intracranial abnormality. 2.  Complex MRI findings which I feel over  this series of exams are most compatible with recurrent and acute on chronic ischemia which is most pronounced in the left MCA and PCA territories.  Slight interval increase in petechial hemorrhage associated with the left temporal operculum lesion first identified 8 days ago. 3.  No definite intracranial metastatic disease.   Original Report Authenticated By: H. Hall III, M.D.     Scheduled Meds:    . albuterol  2.5 mg Nebulization QID  . antiseptic oral rinse  15 mL Mouth Rinse QID  . aspirin  300 mg Rectal Daily  . chlorhexidine  15 mL Mouth Rinse BID  . enoxaparin (LOVENOX) injection  40 mg Subcutaneous Q24H  . insulin aspart  0-15 Units Subcutaneous Q6H  . insulin detemir  15 Units Subcutaneous QHS  . ipratropium  0.5 mg Nebulization QID  . pantoprazole (PROTONIX) IV  40 mg Intravenous Q24H   Continuous Infusions:    . dextrose 5 % and 0.45 % NaCl with KCl 20 mEq/L 100 mL/hr (12/31/12 0941)    Principal Problem:  *CVA (cerebral infarction) Active Problems:  ADENOCARCIMONA, LUNG, NONSMALL CELL  Lung cancer  Acute respiratory failure  COPD (chronic obstructive pulmonary disease)  Aphasia  Dysphagia  Diabetes mellitus  GERD (gastroesophageal reflux disease)     Karanveer Ramakrishnan, MD  [facility] Team 4  Pager [phone] If 7PM-7AM, please contact night-coverage at [email or web address], password [email or web address] 12/31/2012, 11:36 AM  LOS: 16 days   Time spent: 35 minutes greater than 50% in counseling and coordination of care     

## 2012-12-31 NOTE — Interval H&P Note (Cosign Needed)
History and Physical Interval Note: patient's history has been reviewed and he is appropriate for percutaneous gastric tube placement.Patient currently resting in chair comfortably. No family present nor reachable at this time.  Appears this has been discussed with them and they would like to proceed with gastric tube placement. Will plan for placement 01/01/13 in IR - will need barium enema assistance as patient has been unable to tolerate NGT placement. No new changes in ROS or PE since H&p earlier today.   12/31/2012 1:41 PM  Labs have been reviewed and are appropriate for gastric tube placement. Will recheck coags in the am. Plan for placement of gastric tube 1/14 if family provides consent and coag results are WNL.  CAMPBELL,PAMELA D  Dr. Irish Lack

## 2013-01-01 ENCOUNTER — Inpatient Hospital Stay (HOSPITAL_COMMUNITY): Payer: Medicare Other

## 2013-01-01 ENCOUNTER — Encounter (HOSPITAL_COMMUNITY): Payer: Self-pay | Admitting: Radiology

## 2013-01-01 DIAGNOSIS — J159 Unspecified bacterial pneumonia: Secondary | ICD-10-CM

## 2013-01-01 DIAGNOSIS — I6330 Cerebral infarction due to thrombosis of unspecified cerebral artery: Secondary | ICD-10-CM

## 2013-01-01 DIAGNOSIS — R5381 Other malaise: Secondary | ICD-10-CM

## 2013-01-01 LAB — PROTIME-INR: INR: 1.18 (ref 0.00–1.49)

## 2013-01-01 LAB — GLUCOSE, CAPILLARY
Glucose-Capillary: 47 mg/dL — ABNORMAL LOW (ref 70–99)
Glucose-Capillary: 153 mg/dL — ABNORMAL HIGH (ref 70–99)

## 2013-01-01 LAB — BASIC METABOLIC PANEL
CO2: 23 mEq/L (ref 19–32)
Calcium: 9.5 mg/dL (ref 8.4–10.5)
Glucose, Bld: 77 mg/dL (ref 70–99)
Potassium: 3.9 mEq/L (ref 3.5–5.1)
Sodium: 142 mEq/L (ref 135–145)

## 2013-01-01 MED ORDER — IOHEXOL 300 MG/ML  SOLN
50.0000 mL | Freq: Once | INTRAMUSCULAR | Status: AC | PRN
  Administered 2013-01-01: 20 mL via INTRAVENOUS

## 2013-01-01 MED ORDER — MORPHINE SULFATE 2 MG/ML IJ SOLN
2.0000 mg | INTRAMUSCULAR | Status: DC | PRN
  Administered 2013-01-01 – 2013-01-02 (×2): 2 mg via INTRAVENOUS
  Filled 2013-01-01 (×2): qty 1

## 2013-01-01 MED ORDER — DEXTROSE 50 % IV SOLN
INTRAVENOUS | Status: AC
  Administered 2013-01-01: 50 mL
  Filled 2013-01-01: qty 50

## 2013-01-01 MED ORDER — MIDAZOLAM HCL 2 MG/2ML IJ SOLN
INTRAMUSCULAR | Status: AC | PRN
  Administered 2013-01-01 (×2): 0.5 mg via INTRAVENOUS

## 2013-01-01 MED ORDER — ALBUTEROL SULFATE (5 MG/ML) 0.5% IN NEBU
2.5000 mg | INHALATION_SOLUTION | Freq: Three times a day (TID) | RESPIRATORY_TRACT | Status: DC
  Administered 2013-01-02 (×2): 2.5 mg via RESPIRATORY_TRACT
  Filled 2013-01-01 (×3): qty 0.5

## 2013-01-01 MED ORDER — INSULIN DETEMIR 100 UNIT/ML ~~LOC~~ SOLN
10.0000 [IU] | Freq: Every day | SUBCUTANEOUS | Status: DC
  Filled 2013-01-01: qty 10

## 2013-01-01 MED ORDER — FENTANYL CITRATE 0.05 MG/ML IJ SOLN
INTRAMUSCULAR | Status: AC | PRN
  Administered 2013-01-01: 25 ug via INTRAVENOUS

## 2013-01-01 MED ORDER — DEXTROSE 50 % IV SOLN: 50.0000 mL | Freq: Once | INTRAVENOUS | Status: AC | PRN

## 2013-01-01 MED ORDER — IPRATROPIUM BROMIDE 0.02 % IN SOLN
0.5000 mg | Freq: Three times a day (TID) | RESPIRATORY_TRACT | Status: DC
  Administered 2013-01-02 (×2): 0.5 mg via RESPIRATORY_TRACT
  Filled 2013-01-01 (×3): qty 2.5

## 2013-01-01 NOTE — Progress Notes (Signed)
Physical Therapy Treatment Patient Details Name: Johnathan Bowman MRN: [id number] DOB: [birth year] Today's Date: 01/01/2013 Time: 2130-8657 PT Time Calculation (min): 30 min  PT Assessment / Plan / Recommendation Comments on Treatment Session  Pt progressing well towards all goals. Pt with significant improvement in ambulation ability and tolerance. Pt con't to have visual and speech deficits and requires 24/7 assist upon d/c. Acute PT to con't to follow to address mentioned deficits.    Follow Up Recommendations  SNF;CIR;Supervision/Assistance - 24 hour     Does the patient have the potential to tolerate intense rehabilitation     Barriers to Discharge        Equipment Recommendations  Rolling walker with 5" wheels    Recommendations for Other Services    Frequency Min 3X/week   Plan Discharge plan remains appropriate;Frequency remains appropriate    Precautions / Restrictions Precautions Precautions: Fall Precaution Comments: dysphagia aphasia Restrictions Weight Bearing Restrictions: No   Pertinent Vitals/Pain 0/10    Mobility  Bed Mobility Bed Mobility: Supine to Sit;Sitting - Scoot to Edge of Bed Supine to Sit: HOB elevated;5: Supervision;With rails Sitting - Scoot to Edge of Bed: 5: Supervision;With rail Details for Bed Mobility Assistance: increased time Transfers Transfers: Sit to Stand;Stand to Sit Sit to Stand: 4: Min assist;With upper extremity assist;From bed Stand to Sit: 4: Min assist;With upper extremity assist;To chair/3-in-1 Details for Transfer Assistance: v/c's for safe hand placement and walker management with turning Ambulation/Gait Ambulation/Gait Assistance: 4: Min assist Ambulation Distance (Feet): 300 Feet Assistive device: Rolling walker Ambulation/Gait Assistance Details: minA for walker management, v/c's to stay in walker Gait Pattern: Step-through pattern;Decreased stride length Gait velocity: decreased General Gait Details: SPO2 95% on RA  during amb and exercises Stairs: No    Exercises Total Joint Exercises Long Arc Quad: AROM;Both;10 reps;Seated (with 5 sec hold) General Exercises - Lower Extremity Hip ABduction/ADduction: Both;10 reps;Seated;AROM Hip Flexion/Marching: AROM;Both;10 reps;Seated Heel Raises: Both;10 reps;Seated   PT Diagnosis:    PT Problem List:   PT Treatment Interventions:     PT Goals Acute Rehab PT Goals PT Goal: Supine/Side to Sit - Progress: Progressing toward goal PT Goal: Sit to Stand - Progress: Progressing toward goal PT Goal: Stand to Sit - Progress: Progressing toward goal PT Transfer Goal: Bed to Chair/Chair to Bed - Progress: Progressing toward goal PT Goal: Ambulate - Progress: Progressing toward goal  Visit Information  Last PT Received On: 01/01/13 Assistance Needed: +1    Subjective Data  Subjective: Pt nonverbal but able to communicate via head nodding. Pt received supine in bed agreeable to PT.   Cognition  Overall Cognitive Status: Appears within functional limits for tasks assessed/performed Arousal/Alertness: Awake/alert Orientation Level:  (difficult to assess due to nonverbal) Behavior During Session: [facility] for tasks performed    Balance     End of Session PT - End of Session Equipment Utilized During Treatment: Gait belt Activity Tolerance: Patient tolerated treatment well Patient left: in chair;with call bell/phone within reach Nurse Communication: Mobility status   GP     Marcene Brawn 01/01/2013, 9:20 AM  Lewis Shock, PT, DPT Pager #: [phone] Office #: [phone]

## 2013-01-01 NOTE — Procedures (Signed)
Procedure:  Gastrostomy tube placement Findings:  20 Fr pull-through gastrostomy placed with tip in body of stomach.  OK to use tomorrow.

## 2013-01-01 NOTE — Clinical Social Work Note (Signed)
Clinical Social Work   CSW is continuing to follow for discharge planning needs. CIR is also evaluating for inpatient rehab. Pt is for PEG placement today. CSW will continue to follow for discharge to SNF, in the event CIR is unable to admit pt. Please call with any urgent needs.   Dede Query, MSW, Theresia Majors [phone]

## 2013-01-01 NOTE — Progress Notes (Signed)
Met with pt at bedside to talk with him about CIR.  Pt non-verbal but able to  nod answers and was in agreement with plan to come to inpatient rehab. Pt is to have PEG placement later today and we will plan to admit him to CIR tomorrow. Dr Brien Few is in agreement with plan. Have attempted to call pt's wife and left a message with pt's daughter. Will continue to work on contacting family to discuss CIR and to obtain info on pt's home/discharge situation.  For questions, please call [phone].

## 2013-01-01 NOTE — Progress Notes (Signed)
[facility] PROGRESS NOTE  Johnathan Bowman ZOX:[id number] DOB: 09-07-1929 DOA: 12/15/2012 PCP: Dorrene German, MD  Assessment/Plan: Principal Problem:  *CVA (cerebral infarction) Active Problems:  ADENOCARCIMONA, LUNG, NONSMALL CELL  Lung cancer  Acute respiratory failure  COPD (chronic obstructive pulmonary disease)  Aphasia  Dysphagia  Diabetes mellitus  GERD (gastroesophageal reflux disease)    1. Acute on chronic ischemia left MCA and PCA territories:  Patient presented with altered mental status, associated with expressive aphasia and severe dysphagia. Initial MRI of 12/21/12, MRI on raised suspicion for CVA +/- metastatic disease. Neurology consultation was provided by Dr Thana Farr. Follow up brain MRI of 12/29/12, revealed no new intracranial abnormality, but the complex MRI findings were felt most compatible with recurrent and acute on chronic ischemia which is most pronounced in the left MCA and PCA territories. No definite intracranial metastatic disease. Patient subsequently failed Failed swallow study, he desired trial of temporary tube feeds, but was unable to tolerate nasogastric tube insertion. Following discussion with [patient and family, Dr Irish Lack provided IR consultation, and patient is s/p PEG on 01/01/13.  2. Nutrition: As above. PEG tube. 3. HCAP: This was confirmed by CXR at presentation, which revealed left basilar airspace consolidation and has now resolved, following completed of a course of antibiotics. 4. Loculated malignant L pelural effusion (hx of pleurodesis): Patient has a known history of a left sided malignant pleural effusion (cytology from 01/05/12) and has had a Pleurex catheter placed in the past. Although there is evidence of a persistent left effusion on imaging studies at this time, patient is currently not in any respiratory distress at this time, and so has no indication for intervention.  5. Acute on chronic resp failure: Due to  HCAP/lung CA. Upon arrival to the ED he was hypoxic, was started on BIPAP and subsequently intubated. Extubated on 12/19/2012, following treatment for pneumonia, bu required re-intubation on 12/20/2012 due to recurrent acute respiratory decline due to inability to protect his own airway.  Since extubation however, respiratory status has remained stable, and he is currently saturating at 95%-98% on RA. Clinically resolved.   6. Metastatic (stage IV) non small cell lung cancer: Stable.   7. Anemia due to chronic disease: Stable.  8. HTN: Stable without medication. 9. DM: Reasonably controlled on SSDI/Levemir, adjusted as indicated.  10. GERD: Asymptomatic on PPI.  1.   Code Status: DNR/DNI Family Communication:  Disposition Plan: Aiming CIR on 01/02/13.   Brief narrative: 79 year old male with PMH relevant for HTN, DM, dyslipidemia, GERD, CAD, DVT. Diagnosed with metastatic (stage IV) non small cell lung cancer in November 2011 ([facility]). Treated since then with chemotherapy. Last cycle given in October 2013. He has history of a left sided malignant pleural effusion (cytology from 01/05/12) and had a Pleurex catheter placed in the past. He presented to the [facility] ER on 12/28 with a hx of experiencing progressive SOB, productive cough and chills for one week. The day of his admission, he was found by his wife with AMS, some questionable chest pain and worsening respiratory distress. Upon arrival to the ED he was hypoxic and was started on BIPAP. His mental status deteriorated while in the ED and he had to be intubated.  He was treated in the ICU on the PCCM Service until his transfer to a medical bed on 12/27/2012. He completed a course of IV abx for community acquired PNA. He was subsequently extubated on 12/19/2012. His post extubation neurologic exam raised concern for a  CNS event (stroke vs/ metastatic disease). Neurology was consulted. Pt required re-intubation on 12/20/2012 due to recurrent acute resp decline due to  inability to protect his own airway. An EEG was w/o evidence of Sz. MRI on 12/21/2012 raised suspicion for CVA +/- metastatic disease. Neurology signed off. The family then met with PCCM, and decided to pursue extubation w/ no plans for re-intubation. The decision had been made earlier to not pursue PEG tube. He was extubated for the second time on 12/26/2012. At that time an attempt was made to pass an NG for nutrition, but the pt suffered w/ repeat vomiting, and further attempts were aborted. On 12/27/2012 the pt was transferred out of the Neuro ICU to the Neuro floor.   Consultants:  Dr Thana Farr, neurologist.  Dr Faith Rogue, Rehab medicine.   Procedures:  Brain MRI.   PEG placement by IR 01/01/13.   Antibiotics:  Vancomycin.   HPI/Subjective: No new issues. S/P PEG placement today.   Objective: Vital signs in last 24 hours: Temp:  [97.6 F (36.4 C)-97.9 F (36.6 C)] 97.7 F (36.5 C) (01/14 1008) Pulse Rate:  [96-113] 103  (01/14 1504) Resp:  [10-20] 10  (01/14 1454) BP: (130-161)/(71-116) 148/99 mmHg (01/14 1504) SpO2:  [93 %-98 %] 94 % (01/14 1504) Weight change:  Last BM Date: 12/27/12  Intake/Output from previous day: 01/13 0701 - 01/14 0700 In: 0  Out: 325 [Urine:325]     Physical Exam: General: Comfortable, alert, not short of breath at rest.  HEENT:  Mild clinical pallor, no jaundice, no conjunctival injection or discharge. Hydration is fair.  NECK:  Supple, JVP not seen, no carotid bruits, no palpable lymphadenopathy, no palpable goiter. CHEST:  Clinically clear to auscultation, no wheezes, no crackles. HEART:  Sounds 1 and 2 heard, normal, regular, no murmurs. ABDOMEN:  Full, soft, non-tender, no palpable organomegaly, no palpable masses, normal bowel sounds. PEG site is under dressings.  GENITALIA:  Not examined. . LOWER EXTREMITIES:  No pitting edema, palpable peripheral pulses. MUSCULOSKELETAL SYSTEM:  Generalized osteoarthritic changes, otherwise,  normal. CENTRAL NERVOUS SYSTEM:  No focal neurologic deficit on gross examination.  Lab Results: No results found for this basename: WBC:2,HGB:2,HCT:2,PLT:2 in the last 72 hours  Basename 01/01/13 0547 12/31/12 0625  NA 142 141  K 3.9 4.2  CL 110 108  CO2 23 24  GLUCOSE 77 213*  BUN 10 13  CREATININE 0.98 1.01  CALCIUM 9.5 9.6   No results found for this or any previous visit (from the past 240 hour(s)).   Studies/Results: Ir Gastrostomy Tube Mod Sed  01/01/2013  *RADIOLOGY REPORT*  Clinical Data:  History of cerebral infarction.  The patient requires a gastrostomy tube due to inability to safely ingest food orally.  PERCUTANEOUS GASTROSTOMY TUBE PLACEMENT  Sedation:  1.0 mg IV Versed; 25 mcg IV Fentanyl.  Total Moderate Sedation Time: 9 minutes.  Contrast:  20 ml Omnipaque-300  Additional Medications:  1 gram IV vancomycin was administered prior to the procedure. Vancomycin was given within two hours of incision.  Vancomycin was given due to an antibiotic allergy.  Fluoroscopy Time: 4.2 minutes.  Procedure:  The procedure, risks, benefits, and alternatives were explained to the patient.  Questions regarding the procedure were encouraged and answered.  The patient understands and consents to the procedure.  A 5-French catheter was advanced through the patient's mouth under fluoroscopy into the esophagus and to the level of the stomach. This catheter was used to insufflate the stomach with air  under fluoroscopy.  The abdominal wall was prepped with Betadine in a sterile fashion, and a sterile drape was applied covering the operative field.  A sterile gown and sterile gloves were used for the procedure. Local anesthesia was provided with 1% Lidocaine.  A skin incision was made in the upper abdominal wall.  Under fluoroscopy, an 18 gauge trocar needle was advanced into the stomach.  Contrast injection was performed to confirm intraluminal position of the needle tip.  A single T tack was then  deployed in the lumen of the stomach.  This was brought up to tension at the skin surface.  Over a guidewire, a 9-French sheath was advanced into the lumen of the stomach.  The wire was left in place as a safety wire.  A loop snare device from a percutaneous gastrostomy kit was then advanced into the stomach.  A floppy guide wire was advanced through the orogastric catheter under fluoroscopy in the stomach.  The loop snare advanced through the percutaneous gastric access was used to snare the guide wire. This allowed withdrawal of the loop snare out of the patient's mouth by retraction of the orogastric catheter and wire.  A 20-French bumper retention gastrostomy tube was looped around the snare device.  It was then pulled back through the patient's mouth. The retention bumper was brought up to the anterior gastric wall. The T tack suture was cut at the skin.  The exiting gastrostomy tube was cut to appropriate length and a feeding adapter applied. The catheter was injected with contrast material to confirm position and a fluoroscopic spot image saved.  The tube was then flushed with saline.  A dressing was applied over the gastrostomy exit site.  Complications:  None  Findings:  Initial fluoroscopy demonstrates adequate opacification of the colon by ingested barium in order to prevent colonic injury during the procedure.  The stomach distended well with air allowing safe placement of the gastrostomy tube.  After placement, the tip of the gastrostomy tube lies in the body of the stomach.  IMPRESSION: Percutaneous gastrostomy with placement of a 20-French bumper retention tube in the body of the stomach.  This tube can be used for percutaneous feeds beginning in 24 hours after placement.   Original Report Authenticated By: Irish Lack, M.D.     Medications: Scheduled Meds:   . albuterol  2.5 mg Nebulization TID  . antiseptic oral rinse  15 mL Mouth Rinse QID  . aspirin  300 mg Rectal Daily  .  chlorhexidine  15 mL Mouth Rinse BID  . insulin aspart  0-15 Units Subcutaneous Q6H  . insulin detemir  20 Units Subcutaneous QHS  . ipratropium  0.5 mg Nebulization TID  . pantoprazole (PROTONIX) IV  40 mg Intravenous Q24H   Continuous Infusions:   . dextrose 5 % and 0.45 % NaCl with KCl 20 mEq/L 100 mL/hr at 01/01/13 0740   PRN Meds:.acetaminophen, albuterol, bisacodyl, dextrose, fentaNYL, sodium chloride    LOS: 17 days   Yamna Mackel,CHRISTOPHER  [facility] Pager [phone]. If 8PM-8AM, please contact night-coverage at [email or web address], password [facility] 01/01/2013, 4:00 PM  LOS: 17 days

## 2013-01-01 NOTE — Progress Notes (Addendum)
Hypoglycemic Event  CBG: 47  Treatment: D50 IV 50 mL  Symptoms: None  Follow-up CBG: Time:0655 CBG Result:154   Possible Reasons for Event: Inadequate meal intake  Comments/MD notified:[facility] notified     Tobias Alexander  Remember to initiate Hypoglycemia Order Set & complete

## 2013-01-02 ENCOUNTER — Inpatient Hospital Stay (HOSPITAL_COMMUNITY)
Admission: RE | Admit: 2013-01-02 | Discharge: 2013-01-16 | DRG: 945 | Disposition: A | Discharge status: Skilled Nursing Facility | Payer: Medicare Other | Source: Intra-hospital | Attending: Physical Medicine & Rehabilitation | Admitting: Physical Medicine & Rehabilitation

## 2013-01-02 DIAGNOSIS — Z794 Long term (current) use of insulin: Secondary | ICD-10-CM

## 2013-01-02 DIAGNOSIS — E1165 Type 2 diabetes mellitus with hyperglycemia: Secondary | ICD-10-CM

## 2013-01-02 DIAGNOSIS — Z86718 Personal history of other venous thrombosis and embolism: Secondary | ICD-10-CM

## 2013-01-02 DIAGNOSIS — Z931 Gastrostomy status: Secondary | ICD-10-CM

## 2013-01-02 DIAGNOSIS — I69991 Dysphagia following unspecified cerebrovascular disease: Secondary | ICD-10-CM

## 2013-01-02 DIAGNOSIS — I6330 Cerebral infarction due to thrombosis of unspecified cerebral artery: Secondary | ICD-10-CM

## 2013-01-02 DIAGNOSIS — R1312 Dysphagia, oropharyngeal phase: Secondary | ICD-10-CM

## 2013-01-02 DIAGNOSIS — F4321 Adjustment disorder with depressed mood: Secondary | ICD-10-CM

## 2013-01-02 DIAGNOSIS — E119 Type 2 diabetes mellitus without complications: Secondary | ICD-10-CM

## 2013-01-02 DIAGNOSIS — C3490 Malignant neoplasm of unspecified part of unspecified bronchus or lung: Secondary | ICD-10-CM | POA: Diagnosis present

## 2013-01-02 DIAGNOSIS — R4701 Aphasia: Secondary | ICD-10-CM

## 2013-01-02 DIAGNOSIS — Z5189 Encounter for other specified aftercare: Principal | ICD-10-CM

## 2013-01-02 DIAGNOSIS — E785 Hyperlipidemia, unspecified: Secondary | ICD-10-CM

## 2013-01-02 DIAGNOSIS — I10 Essential (primary) hypertension: Secondary | ICD-10-CM

## 2013-01-02 DIAGNOSIS — I639 Cerebral infarction, unspecified: Secondary | ICD-10-CM | POA: Diagnosis present

## 2013-01-02 DIAGNOSIS — Z7982 Long term (current) use of aspirin: Secondary | ICD-10-CM

## 2013-01-02 DIAGNOSIS — I2510 Atherosclerotic heart disease of native coronary artery without angina pectoris: Secondary | ICD-10-CM

## 2013-01-02 DIAGNOSIS — R29810 Facial weakness: Secondary | ICD-10-CM

## 2013-01-02 DIAGNOSIS — C3410 Malignant neoplasm of upper lobe, unspecified bronchus or lung: Secondary | ICD-10-CM

## 2013-01-02 DIAGNOSIS — J449 Chronic obstructive pulmonary disease, unspecified: Secondary | ICD-10-CM | POA: Diagnosis present

## 2013-01-02 DIAGNOSIS — R1310 Dysphagia, unspecified: Secondary | ICD-10-CM | POA: Diagnosis present

## 2013-01-02 DIAGNOSIS — Z79899 Other long term (current) drug therapy: Secondary | ICD-10-CM

## 2013-01-02 DIAGNOSIS — Z923 Personal history of irradiation: Secondary | ICD-10-CM

## 2013-01-02 DIAGNOSIS — K219 Gastro-esophageal reflux disease without esophagitis: Secondary | ICD-10-CM

## 2013-01-02 DIAGNOSIS — Z87891 Personal history of nicotine dependence: Secondary | ICD-10-CM

## 2013-01-02 LAB — GLUCOSE, CAPILLARY
Glucose-Capillary: 131 mg/dL — ABNORMAL HIGH (ref 70–99)
Glucose-Capillary: 134 mg/dL — ABNORMAL HIGH (ref 70–99)
Glucose-Capillary: 153 mg/dL — ABNORMAL HIGH (ref 70–99)
Glucose-Capillary: 175 mg/dL — ABNORMAL HIGH (ref 70–99)

## 2013-01-02 LAB — BASIC METABOLIC PANEL
BUN: 7 mg/dL (ref 6–23)
CO2: 22 mEq/L (ref 19–32)
Calcium: 9.4 mg/dL (ref 8.4–10.5)
GFR calc non Af Amer: 76 mL/min — ABNORMAL LOW (ref >90)
Glucose, Bld: 136 mg/dL — ABNORMAL HIGH (ref 70–99)
Sodium: 136 mEq/L (ref 135–145)

## 2013-01-02 MED ORDER — CHLORHEXIDINE GLUCONATE 0.12 % MT SOLN
15.0000 mL | Freq: Two times a day (BID) | OROMUCOSAL | Status: DC
Start: 2013-01-02 — End: 2013-01-16
  Administered 2013-01-03 – 2013-01-16 (×27): 15 mL via OROMUCOSAL
  Filled 2013-01-02 (×31): qty 15

## 2013-01-02 MED ORDER — FLEET ENEMA 7-19 GM/118ML RE ENEM: 1.0000 | ENEMA | Freq: Once | RECTAL | Status: AC | PRN

## 2013-01-02 MED ORDER — MORPHINE SULFATE 10 MG/5ML PO SOLN
10.0000 mg | ORAL | Status: DC | PRN
  Administered 2013-01-04: 10 mg
  Filled 2013-01-02: qty 5

## 2013-01-02 MED ORDER — JEVITY 1.2 CAL PO LIQD
1000.0000 mL | ORAL | Status: DC
  Administered 2013-01-02: 1000 mL
  Filled 2013-01-02 (×2): qty 1000

## 2013-01-02 MED ORDER — GUAIFENESIN-DM 100-10 MG/5ML PO SYRP: 5.0000 mL | ORAL_SOLUTION | Freq: Four times a day (QID) | ORAL | Status: DC | PRN

## 2013-01-02 MED ORDER — ASPIRIN 325 MG PO TABS
325.0000 mg | ORAL_TABLET | Freq: Every day | ORAL | Status: DC
  Administered 2013-01-03 – 2013-01-16 (×14): 325 mg
  Filled 2013-01-02 (×16): qty 1

## 2013-01-02 MED ORDER — ONDANSETRON HCL 4 MG PO TABS: 4.0000 mg | ORAL_TABLET | Freq: Four times a day (QID) | ORAL | Status: DC | PRN

## 2013-01-02 MED ORDER — INSULIN ASPART 100 UNIT/ML ~~LOC~~ SOLN
0.0000 [IU] | Freq: Four times a day (QID) | SUBCUTANEOUS | Status: DC
  Administered 2013-01-02: 3 [IU] via SUBCUTANEOUS
  Administered 2013-01-03: 5 [IU] via SUBCUTANEOUS
  Administered 2013-01-03 (×2): 2 [IU] via SUBCUTANEOUS
  Administered 2013-01-04 – 2013-01-05 (×3): 3 [IU] via SUBCUTANEOUS
  Administered 2013-01-05: 2 [IU] via SUBCUTANEOUS
  Administered 2013-01-05: 3 [IU] via SUBCUTANEOUS
  Administered 2013-01-05: 5 [IU] via SUBCUTANEOUS
  Administered 2013-01-06: 8 [IU] via SUBCUTANEOUS
  Administered 2013-01-06: 3 [IU] via SUBCUTANEOUS
  Administered 2013-01-06: 2 [IU] via SUBCUTANEOUS
  Administered 2013-01-06: 3 [IU] via SUBCUTANEOUS
  Administered 2013-01-07 (×2): 5 [IU] via SUBCUTANEOUS
  Administered 2013-01-07: 2 [IU] via SUBCUTANEOUS
  Administered 2013-01-08 (×3): 3 [IU] via SUBCUTANEOUS
  Administered 2013-01-08: 2 [IU] via SUBCUTANEOUS
  Administered 2013-01-09: 3 [IU] via SUBCUTANEOUS
  Administered 2013-01-09: 2 [IU] via SUBCUTANEOUS
  Administered 2013-01-10: 5 [IU] via SUBCUTANEOUS

## 2013-01-02 MED ORDER — PRO-STAT SUGAR FREE PO LIQD
30.0000 mL | Freq: Every day | ORAL | Status: DC
  Administered 2013-01-03: 30 mL
  Filled 2013-01-02 (×2): qty 30

## 2013-01-02 MED ORDER — TRAZODONE HCL 50 MG PO TABS: 25.0000 mg | ORAL_TABLET | Freq: Every evening | ORAL | Status: DC | PRN

## 2013-01-02 MED ORDER — TRAMADOL HCL 50 MG PO TABS
50.0000 mg | ORAL_TABLET | Freq: Four times a day (QID) | ORAL | Status: DC | PRN
  Administered 2013-01-02 – 2013-01-14 (×7): 50 mg via ORAL
  Filled 2013-01-02 (×8): qty 1

## 2013-01-02 MED ORDER — FREE WATER
200.0000 mL | Freq: Four times a day (QID) | Status: DC
  Administered 2013-01-02 – 2013-01-03 (×6): 200 mL
  Administered 2013-01-04: 100 mL
  Administered 2013-01-04 – 2013-01-16 (×48): 200 mL

## 2013-01-02 MED ORDER — INSULIN GLARGINE 100 UNIT/ML ~~LOC~~ SOLN
10.0000 [IU] | Freq: Every day | SUBCUTANEOUS | Status: DC
  Administered 2013-01-02 – 2013-01-15 (×13): 10 [IU] via SUBCUTANEOUS

## 2013-01-02 MED ORDER — BISACODYL 10 MG RE SUPP
10.0000 mg | Freq: Every day | RECTAL | Status: DC | PRN
  Administered 2013-01-06: 10 mg via RECTAL
  Filled 2013-01-02 (×2): qty 1

## 2013-01-02 MED ORDER — ALBUTEROL SULFATE (5 MG/ML) 0.5% IN NEBU
2.5000 mg | INHALATION_SOLUTION | RESPIRATORY_TRACT | Status: DC | PRN
  Administered 2013-01-04: 2.5 mg via RESPIRATORY_TRACT
  Filled 2013-01-02: qty 0.5

## 2013-01-02 MED ORDER — BISACODYL 10 MG RE SUPP: 10.0000 mg | Freq: Every day | RECTAL | Status: DC | PRN

## 2013-01-02 MED ORDER — ONDANSETRON HCL 4 MG/2ML IJ SOLN: 4.0000 mg | Freq: Four times a day (QID) | INTRAMUSCULAR | Status: DC | PRN

## 2013-01-02 MED ORDER — PANTOPRAZOLE SODIUM 40 MG IV SOLR
40.0000 mg | INTRAVENOUS | Status: DC
  Filled 2013-01-02: qty 40

## 2013-01-02 MED ORDER — BIOTENE DRY MOUTH MT LIQD
15.0000 mL | Freq: Four times a day (QID) | OROMUCOSAL | Status: DC
Start: 2013-01-02 — End: 2013-01-16
  Administered 2013-01-02 – 2013-01-15 (×45): 15 mL via OROMUCOSAL

## 2013-01-02 MED ORDER — PRO-STAT SUGAR FREE PO LIQD
30.0000 mL | Freq: Every day | ORAL | Status: DC
  Administered 2013-01-02: 30 mL
  Filled 2013-01-02: qty 30

## 2013-01-02 MED ORDER — ENOXAPARIN SODIUM 40 MG/0.4ML ~~LOC~~ SOLN
40.0000 mg | SUBCUTANEOUS | Status: DC
  Administered 2013-01-02 – 2013-01-15 (×14): 40 mg via SUBCUTANEOUS
  Filled 2013-01-02 (×15): qty 0.4

## 2013-01-02 MED ORDER — FREE WATER
200.0000 mL | Freq: Four times a day (QID) | Status: DC
  Administered 2013-01-02: 200 mL

## 2013-01-02 MED ORDER — ALUM & MAG HYDROXIDE-SIMETH 200-200-20 MG/5ML PO SUSP
30.0000 mL | ORAL | Status: DC | PRN
  Administered 2013-01-05 – 2013-01-13 (×4): 30 mL
  Filled 2013-01-02 (×4): qty 30

## 2013-01-02 MED ORDER — JEVITY 1.2 CAL PO LIQD
1000.0000 mL | ORAL | Status: DC
  Filled 2013-01-02 (×3): qty 1000

## 2013-01-02 MED ORDER — ACETAMINOPHEN 325 MG PO TABS
325.0000 mg | ORAL_TABLET | ORAL | Status: DC | PRN
Start: 2013-01-02 — End: 2013-01-16
  Administered 2013-01-04 – 2013-01-11 (×4): 650 mg
  Filled 2013-01-02 (×5): qty 2

## 2013-01-02 NOTE — Progress Notes (Signed)
NUTRITION FOLLOW UP  Intervention:   Initiate Jevity 1.2 @ 20 ml/hr via PEG and increase by 10 ml every 4 hours to goal rate of 60 ml/hr. 30 ml Prostat daily.  At goal rate, tube feeding regimen will provide 1828 kcal, 95 grams of protein, and 1166 ml of H2O.   200 ml free water every 6 hours   Nutrition Dx:   Inadequate oral intake related to inability to eat as evidenced by NPO status; ongoing.  Goal:   Pt to  meet >/= 90% of their estimated nutrition needs; not met.   Monitor:   TF tolerance, weight, labs  Assessment:   Patient with hx of metastatic (stage IV) non small cell lung cancer; last cycle of chemo in October '13. Pt admitted with altered mental status, expressive aphasia and severe dysphagia. Per MRI pt with acute on chronic ischemia in left MCA and PCA, pt with no definite intracranial metastatic disease.  After extubation on 1/10 pt failed swallow evaluation and was unable to tolerated nasogastric tube insertion. Pt/family decided to pursue PEG which was placed 1/14.  Pt being evaluated for CIR.  Spoke with Dr Brien Few, who will discontinue the IVF as I will be ordering free water flushes via pt's PEG.   Height: Ht Readings from Last 1 Encounters:  12/15/12 5\' 7"  (1.702 m)    Weight Status:   Wt Readings from Last 1 Encounters:  12/27/12 169 lb 5 oz (76.8 kg)    Re-estimated needs:  Kcal: 1700-1900 Protein: 95-110 grams Fluid: >1.7 L/day  Skin: abdominal blister  Diet Order: NPO   Intake/Output Summary (Last 24 hours) at 01/02/13 1010 Last data filed at 01/02/13 0209  Gross per 24 hour  Intake      0 ml  Output    350 ml  Net   -350 ml   Last BM: 1/14  Labs:   Lab 01/02/13 0500 01/01/13 0547 12/31/12 0625  NA 136 142 141  K 4.0 3.9 4.2  CL 104 110 108  CO2 22 23 24   BUN 7 10 13   CREATININE 0.95 0.98 1.01  CALCIUM 9.4 9.5 9.6  MG -- -- --  PHOS -- -- --  GLUCOSE 136* 77 213*    CBG (last 3)   Basename 01/02/13 0601 01/02/13 0008 01/01/13  2256  GLUCAP 134* 153* 153*    Scheduled Meds:    . albuterol  2.5 mg Nebulization TID  . antiseptic oral rinse  15 mL Mouth Rinse QID  . aspirin  300 mg Rectal Daily  . chlorhexidine  15 mL Mouth Rinse BID  . insulin aspart  0-15 Units Subcutaneous Q6H  . ipratropium  0.5 mg Nebulization TID  . pantoprazole (PROTONIX) IV  40 mg Intravenous Q24H    Continuous Infusions:    . dextrose 5 % and 0.45 % NaCl with KCl 20 mEq/L 100 mL/hr at 01/01/13 1936    Kendell Bane RD, LDN, CNSC [phone] Pager [phone] After Hours Pager

## 2013-01-02 NOTE — H&P (Signed)
Physical Medicine and Rehabilitation Admission H&P  Chief Complaint   Patient presents with   .  Problems speaking and dysphagia.   :  HPI: Johnathan Bowman is a 79 y.o. male with history of DM, stage IV metastatic lung cancer with history of malignant pleural effusion requiring Pleurex cath in the past; admitted on 12/15/12 with AMS changes with progressive SOB and chills. He required intubation in ED due to hypoxia and metabolic acidosis. He was started on antibiotics for HCAP. CT head negative. CXR with loculated malignant left effusion--being monitored as patient with improvement. Patient with mutism and facial weakness. Neurology consulted and MRI of brain done showing Suspicion of acute/subacute infarct in right internal capsule-tumor not entirely excluded. Repeat MRI in 7 days recommended. EEG with mild slowing and question of metabolic encephalopathy. Goals of care discussed with family and no PEG/no trach decided initially. Extubated on 01/08. BSS done and NPO recommended. Patient unable to tolerate NGT but indicating hunger. He continues to have difficulty handling oral secretions needing frequent suctioning. Repeat MRI brain on 01/11 reveals recurrent acute on chronic ischemia most pronounced in left MCA and PCA territories with slight increase in petechial hemorrhage associated with left temporal operculum lesion.  Family has decided on placing PEG and this was done by IVR on 01/01/13. Therapy evaluations done and patient limited by balance deficits as well as diplopia, generalized weakness and aphasia. He is able to follow basic commands and express basic wants and needs. He attempts to communicate via gestures and writing. Remains nonverbal maintaining open mouth posture almost consistently. Rehab was asked by the acute service to evaluate this patient for inpatient rehab admission. He was felt to be appropriate candidate and was admitted today. Review of Systems  Unable to perform ROS: language    Respiratory: Positive for cough and shortness of breath (uses oxygen with activity per wife. ).  Gastrointestinal: Positive for abdominal pain.  Musculoskeletal: Positive for back pain.   Past Medical History   Diagnosis  Date   .  Hypertension    .  Diabetes mellitus    .  Dyslipidemia    .  Mini stroke  2005   .  Degenerative disc disease    .  GERD (gastroesophageal reflux disease)    .  Osteopenia  2005   .  Coronary artery disease  2005     treated medically   .  BPH (benign prostatic hyperplasia)    .  DVT (deep venous thrombosis)    .  History of chemotherapy    .  Diverticulosis      per scan 05/28/12   .  Calculus of kidney      per scan 05/28/12   .  Hx of radiation therapy  06/14/12 -07/05/12     left chest   .  lung ca  11/12/2010     LUL    Past Surgical History   Procedure  Date   .  No past surgeries    .  Pleurex cath  02/08/11     left-sided pleural effusion    History reviewed. No pertinent family history.  Social History: Married. Has been homebound and getting home therapies. Wife and son have poor vision and medical issues. Per reports that he quit smoking about 26 years ago. He has never used smokeless tobacco. Per reports that he does not drink alcohol or use illicit drugs.  Allergies   Allergen  Reactions   .  Penicillins  Other (  See Comments)     Unknown childhood allergy    Medications Prior to Admission   Medication  Sig  Dispense  Refill   .  PRESCRIPTION MEDICATION  Chemo Medication: [facility] Physician is Dr. Tanja Port.     Marland Kitchen  acetaminophen (TYLENOL) 500 MG tablet  Take 500 mg by mouth every 6 (six) hours as needed. For pain.     Marland Kitchen  albuterol (PROVENTIL) (2.5 MG/3ML) 0.083% nebulizer solution  Take 3 mLs (2.5 mg total) by nebulization every 6 (six) hours as needed for wheezing.  75 mL  12   .  amLODipine-benazepril (LOTREL) 5-10 MG per capsule  Take 1 capsule by mouth daily.     Marland Kitchen  aspirin 81 MG tablet  Take 81 mg by mouth daily.     Marland Kitchen   dexamethasone (DECADRON) 4 MG tablet  Take 4 mg by mouth 2 (two) times daily with a meal. 2 tabs by mouth BID, the day before, the day of and the day after chemotherapy     .  glyBURIDE-metformin (GLUCOVANCE) 5-500 MG per tablet  Take 2 tablets by mouth 2 (two) times daily.     Marland Kitchen  HYDROcodone-acetaminophen (VICODIN) 5-500 MG per tablet  Take 1-2 tablets by mouth every 6 (six) hours as needed for pain. For pain.  60 tablet  0   .  hydrOXYzine (ATARAX/VISTARIL) 25 MG tablet  Take 1 tablet (25 mg total) by mouth every 4 (four) hours as needed for itching. Take 1 tablet by mouth every 8 hours as needed for itching  60 tablet  0   .  hydrOXYzine (ATARAX/VISTARIL) 25 MG tablet  TAKE 1 TABLET BY MOUTH EVERY 8 HOURS AS NEEDED FOR ITCHING  30 tablet  0   .  lidocaine-prilocaine (EMLA) cream  Apply 1 application topically as needed. Apply to [facility] A Cath site before chemotherapy.     Marland Kitchen  morphine (MS CONTIN) 30 MG 12 hr tablet  Take 1 tablet (30 mg total) by mouth 2 (two) times daily.  60 tablet  0   .  pantoprazole (PROTONIX) 40 MG tablet  Take 1 tablet (40 mg total) by mouth 2 (two) times daily.  30 tablet  3    Home:  Home Living  Lives With: Spouse  Additional Comments: Pt unable to give home environment and PLOF. Family not present.  Functional History:  Prior Function  Able to Take Stairs?: Yes  Driving: Yes  Vocation: Retired  Comments: per pt report  Functional Status:  Mobility:  Bed Mobility  Bed Mobility: Supine to Sit;Sitting - Scoot to Edge of Bed  Supine to Sit: HOB elevated;5: Supervision;With rails  Sitting - Scoot to Edge of Bed: 5: Supervision;With rail  Transfers  Transfers: Sit to Stand;Stand to Sit  Sit to Stand: 4: Min assist;With upper extremity assist;From bed  Sit to Stand: Patient Percentage: 70%  Stand to Sit: 4: Min assist;With upper extremity assist;To chair/3-in-1  Stand to Sit: Patient Percentage: 70%  Stand Pivot Transfers: 1: +2 Total assist  Stand Pivot  Transfers: Patient Percentage: 70%  Ambulation/Gait  Ambulation/Gait Assistance: 4: Min assist  Ambulation/Gait: Patient Percentage: 60%  Ambulation Distance (Feet): 30 Feet  Assistive device: Rolling walker  Ambulation/Gait Assistance Details: minA for walker management, v/c's to stay in walker  Gait Pattern: Step-through pattern;Decreased stride length  Gait velocity: decreased  General Gait Details: SPO2 95% on RA during amb and exercises  Stairs: No   ADL:  ADL  Eating/Feeding: NPO  Grooming: Minimal assistance  Where Assessed - Grooming: Unsupported sitting  Upper Body Bathing: Minimal assistance  Where Assessed - Upper Body Bathing: Unsupported sitting  Lower Body Bathing: Moderate assistance  Where Assessed - Lower Body Bathing: Supported sit to stand  Upper Body Dressing: Minimal assistance  Where Assessed - Upper Body Dressing: Unsupported sitting  Lower Body Dressing: Maximal assistance  Where Assessed - Lower Body Dressing: Supported sit to stand  Toilet Transfer: Minimal assistance  Toilet Transfer Method: Sit to Production manager: Other (comment) (bed - chair)  Equipment Used: Gait belt  Transfers/Ambulation Related to ADLs: min A  ADL Comments: good safety judgement during ADL. Aware of drooling and reaching for towelq  Cognition:  Cognition  Overall Cognitive Status: Impaired  Arousal/Alertness: Awake/alert  Orientation Level: Other (comment) (UTA-pt is nonverbal)  Attention: Focused;Sustained;Selective  Focused Attention: Appears intact  Sustained Attention: Appears intact  Memory: (UTA)  Awareness: Impaired  Awareness Impairment: Anticipatory impairment;Emergent impairment (continues to try and write though mostly unintelligible)  Problem Solving: Impaired  Problem Solving Impairment: (Did not asses due to arrival of PT, TBD)  Behaviors: Impulsive  Safety/Judgment: Impaired  Cognition  Overall Cognitive Status: Appears within functional  limits for tasks assessed/performed  Area of Impairment: Problem solving  Arousal/Alertness: Awake/alert  Orientation Level: (difficult to assess due to nonverbal)  Behavior During Session: [facility] for tasks performed  Problem Solving: following all commands; very patient as writing words and therapist trying to identify his letters (poorly written)  Blood pressure 141/96, pulse 110, temperature 98.4 F (36.9 C), temperature source Oral, resp. rate 18, height 5\' 7"  (1.702 m), weight 76.8 kg (169 lb 5 oz), SpO2 93.00%.  Physical Exam  Nursing note and vitals reviewed.  Constitutional: He appears well-developed and well-nourished.  Fatigued appearing male. Wet, congested sounds with drooling. Difficulty deciphering handwriting attempts at communication.  HENT:  Head: Normocephalic and atraumatic.  Eyes: Pupils are equal, round, and reactive to light.  Neck: Normal range of motion.  Cardiovascular: Normal rate and regular rhythm. No murmurs auscultated Pulmonary/Chest: Effort normal. He has rhonchi.  Audible wet sounds due to pocketing of saliva with coarse sounds throughout. No focal rales auscultated.  Abdominal: Soft. Bowel sounds are normal. There is tenderness (around PEG site.). There is no rebound and no guarding.  Musculoskeletal: He exhibits no edema and no tenderness.  Neurological: He is alert.  Able to say "ah" with cues otherwise speech wet and garbled.Marland Kitchen Answers basic yes/no questions appropriately. Severe right facial weakness and poor oropharyngeal control. Difficulty coughing, drooled regularly.  Follows basic commands without difficulty and moves all four but displays pronator drift on the RUE and decreased FMC as a whole on the right side. Strength 4/5 right upper and lower and 4+ on the left. He does withdraw to pain on the right side.  Skin: Skin is warm and dry.   Results for orders placed during the hospital encounter of 12/15/12   BASIC METABOLIC PANEL Status: Abnormal     Collection Time    01/01/13 5:47 AM   Component  Value  Range  Comment    Sodium  142  135 - 145 mEq/L     Potassium  3.9  3.5 - 5.1 mEq/L     Chloride  110  96 - 112 mEq/L     CO2  23  19 - 32 mEq/L     Glucose, Bld  77  70 - 99 mg/dL     BUN  10  6 - 23 mg/dL     Creatinine, Ser  4.09  0.50 - 1.35 mg/dL     Calcium  9.5  8.4 - 10.5 mg/dL     GFR calc non Af Amer  75 (*)  >90 mL/min     GFR calc Af Amer  87 (*)  >90 mL/min    PROTIME-INR Status: Normal    Collection Time    01/01/13 5:47 AM   Component  Value  Range  Comment    Prothrombin Time  14.8  11.6 - 15.2 seconds     INR  1.18  0.00 - 1.49    GLUCOSE, CAPILLARY Status: Abnormal    Collection Time    01/01/13 6:32 AM   Component  Value  Range  Comment    Glucose-Capillary  47 (*)  70 - 99 mg/dL    GLUCOSE, CAPILLARY Status: Abnormal    Collection Time    01/01/13 6:54 AM   Component  Value  Range  Comment    Glucose-Capillary  154 (*)  70 - 99 mg/dL    GLUCOSE, CAPILLARY Status: Abnormal    Collection Time    01/01/13 11:24 AM   Component  Value  Range  Comment    Glucose-Capillary  155 (*)  70 - 99 mg/dL    GLUCOSE, CAPILLARY Status: Abnormal    Collection Time    01/01/13 6:06 PM   Component  Value  Range  Comment    Glucose-Capillary  135 (*)  70 - 99 mg/dL    GLUCOSE, CAPILLARY Status: Abnormal    Collection Time    01/01/13 10:56 PM   Component  Value  Range  Comment    Glucose-Capillary  153 (*)  70 - 99 mg/dL    GLUCOSE, CAPILLARY Status: Abnormal    Collection Time    01/02/13 12:08 AM   Component  Value  Range  Comment    Glucose-Capillary  153 (*)  70 - 99 mg/dL     Comment 1  Documented in Chart      Comment 2  Notify RN     BASIC METABOLIC PANEL Status: Abnormal    Collection Time    01/02/13 5:00 AM   Component  Value  Range  Comment    Sodium  136  135 - 145 mEq/L     Potassium  4.0  3.5 - 5.1 mEq/L     Chloride  104  96 - 112 mEq/L     CO2  22  19 - 32 mEq/L     Glucose, Bld  136 (*)  70 - 99  mg/dL     BUN  7  6 - 23 mg/dL     Creatinine, Ser  8.11  0.50 - 1.35 mg/dL     Calcium  9.4  8.4 - 10.5 mg/dL     GFR calc non Af Amer  76 (*)  >90 mL/min     GFR calc Af Amer  88 (*)  >90 mL/min    GLUCOSE, CAPILLARY Status: Abnormal    Collection Time    01/02/13 6:01 AM   Component  Value  Range  Comment    Glucose-Capillary  134 (*)  70 - 99 mg/dL    Post Admission Physician Evaluation:  1. Functional deficits secondary to thrombotic left MCA and PCA territory infarcts. 2. Patient is admitted to receive collaborative, interdisciplinary care between the physiatrist, rehab nursing staff, and therapy team. 3. Patient's level of medical  complexity and substantial therapy needs in context of that medical necessity cannot be provided at a lesser intensity of care such as a SNF. 4. Patient has experienced substantial functional loss from his/her baseline which was documented above under the "Functional History" and "Functional Status" headings. Judging by the patient's diagnosis, physical exam, and functional history, the patient has potential for functional progress which will result in measurable gains while on inpatient rehab. These gains will be of substantial and practical use upon discharge in facilitating mobility and self-care at the household level. 5. Physiatrist will provide 24 hour management of medical needs as well as oversight of the therapy plan/treatment and provide guidance as appropriate regarding the interaction of the two. 6. 24 hour rehab nursing will assist with bladder management, bowel management, safety, skin/wound care, disease management, medication administration, pain management and patient education and help integrate therapy concepts, techniques,education, etc. 7. PT will assess and treat for: Lower extremity strength, range of motion, stamina, balance, functional mobility, safety, adaptive techniques and equipment, NMR, family education. Goals are: supervision for  basic mobility. 8. OT will assess and treat for: ADL's, functional mobility, safety, upper extremity strength, adaptive techniques and equipment, NMR, education for family. Goals are: supervision to minimal assist. 9. SLP will assess and treat for: severe oro-pharnyngeal dysphagia, communication, cognition, speech intelligibility. Goals are: min to max assist. 10. Case Management and Social Worker will assess and treat for psychological issues and discharge planning. 11. Team conference will be held weekly to assess progress toward goals and to determine barriers to discharge. 12. Patient will receive at least 3 hours of therapy per day at least 5 days per week. 13. ELOS: 8-12 days Prognosis: good Medical Problem List and Plan:  1. DVT Prophylaxis/Anticoagulation: Pharmaceutical: Lovenox  2. ? Chronic pain: was on MS contin at home and indicating back pain. Will start MSIR prn for pain Management.  3. Mood: Mild anxiety reported. Will monitor for now. Limited due to aphasia and inability to write.  4. Neuropsych: This patient is not capable of making decisions on his/her own behalf.  5. DM type 2: TF  initiated today. Anticipate rise in BS with TF on board. Will resume insulin at a lower dose. Doubt that oral medication will be enough for BS control. Monitor BS with ac/hs CBG checks and adjust appropriately for BS control.  6. Stage IV Lung cancer: Use oxygen at home per wife. Will resume. Use nebs as needed.  7. HTN: check BP on bid basis. Continue to hold Lotrel for now.   01/02/2013  Ranelle Oyster, MD, [place] [facility]

## 2013-01-02 NOTE — Discharge Summary (Signed)
Physician Discharge Summary  Johnathan Bowman ION:[id number] DOB: September 08, 1929 DOA: 12/15/2012  PCP: Dorrene German, MD  Admit date: 12/15/2012 Discharge date: 01/02/2013  Time spent: 40 minutes  Recommendations for Outpatient Follow-up:  1. Follow up with PMD. 2. Follow up with rehab MD.   Discharge Diagnoses:  Principal Problem:  *CVA (cerebral infarction) Active Problems:  ADENOCARCIMONA, LUNG, NONSMALL CELL  Lung cancer  Acute respiratory failure  COPD (chronic obstructive pulmonary disease)  Aphasia  Dysphagia  Diabetes mellitus  GERD (gastroesophageal reflux disease)   Discharge Condition: Satisfactory.   Diet recommendation: Panda Tube Feed/Jevity 1.2 at 60 ml/hr.   Filed Weights   12/26/12 0451 12/27/12 0400 12/27/12 0500  Weight: 81.5 kg (179 lb 10.8 oz) 78.6 kg (173 lb 4.5 oz) 76.8 kg (169 lb 5 oz)    History of present illness:  79 year old male with PMH relevant for HTN, DM, dyslipidemia, GERD, CAD, DVT. Diagnosed with metastatic (stage IV) non small cell lung cancer in November 2011 ([facility]). Treated since then with chemotherapy. Last cycle given in October 2013. He has history of a left sided malignant pleural effusion (cytology from 01/05/12) and had a Pleurex catheter placed in the past. He presented to the [facility] ER on 12/28 with a hx of experiencing progressive SOB, productive cough and chills for one week. The day of his admission, he was found by his wife with AMS, some questionable chest pain and worsening respiratory distress. Upon arrival to the ED he was hypoxic and was started on BIPAP. His mental status deteriorated while in the ED and he had to be intubated.  He was treated in the ICU on the PCCM Service until his transfer to a medical bed on 12/27/2012. He completed a course of IV abx for community acquired PNA. He was subsequently extubated on 12/19/2012. His post extubation neurologic exam raised concern for a CNS event (stroke vs/ metastatic disease). Neurology  was consulted. Pt required re-intubation on 12/20/2012 due to recurrent acute resp decline due to inability to protect his own airway. An EEG was w/o evidence of Sz. MRI on 12/21/2012 raised suspicion for CVA +/- metastatic disease. Neurology signed off. The family then met with PCCM, and decided to pursue extubation w/ no plans for re-intubation. The decision had been made earlier to not pursue PEG tube. He was extubated for the second time on 12/26/2012. At that time an attempt was made to pass an NG for nutrition, but the pt suffered w/ repeat vomiting, and further attempts were aborted. On 12/27/2012 the pt was transferred out of the Neuro ICU to the Neuro floor.   Hospital Course:  1. Acute on chronic ischemia left MCA and PCA territories: Patient presented with altered mental status, associated with expressive aphasia and severe dysphagia. Initial MRI of 12/21/12, MRI on raised suspicion for CVA +/- metastatic disease. Neurology consultation was provided by Dr Thana Farr. Follow up brain MRI of 12/29/12, revealed no new intracranial abnormality, but the complex MRI findings were felt most compatible with recurrent and acute on chronic ischemia, which is most pronounced in the left MCA and PCA territories. No definite intracranial metastatic disease. Patient subsequently failed swallow study, he desired trial of temporary tube feeds, but was unable to tolerate nasogastric tube insertion. Following discussion with patient and family, Dr Irish Lack provided IR consultation, and patient is s/p PEG on 01/01/13. Panda tube feed to be commenced on 01/02/13.  2. Nutrition: As above. PEG tube.  3. HCAP: This was confirmed by  CXR at presentation, which revealed left basilar airspace consolidation and has now resolved, following completion of a course of antibiotics.  4. Loculated malignant L pelural effusion (hx of pleurodesis): Patient has a known history of a left sided malignant pleural effusion (cytology from  01/05/12) and has had a Pleurex catheter placed in the past. Although there is evidence of a persistent left effusion on imaging studies at this time, patient is currently not in any respiratory distress, and so has no indication for intervention.  5. Acute on chronic resp failure: Due to HCAP/lung CA. Upon arrival to the ED, patient was hypoxic, was started on BIPAP and subsequently intubated. Extubated on 12/19/2012, following treatment for pneumonia, but required re-intubation on 12/20/2012 due to recurrent acute respiratory decline, due to inability to protect his own airway. Since extubation however, respiratory status has remained stable, and he is currently saturating at 95%-98% on RA. Clinically resolved.  6. Metastatic (stage IV) non small cell lung cancer: Stable.  7. Anemia due to chronic disease: Stable.  8. HTN: Stable without medication.  9. DM: Reasonably controlled on SSDI/Levemir, adjusted as indicated.  10. GERD: Asymptomatic on PPI.    Procedures:  See Below.   PEG placement by IR 01/01/13.    Consultations: Dr Thana Farr, neurologist.  Dr Faith Rogue, Rehab medicine.   Discharge Exam: Filed Vitals:   01/01/13 1830 01/01/13 2145 01/02/13 0557 01/02/13 0812  BP: 145/99 129/79 141/96   Pulse: 98 115 110   Temp: 98.2 F (36.8 C) 98.8 F (37.1 C) 98.4 F (36.9 C)   TempSrc: Oral  Oral   Resp: 18 18 18    Height:      Weight:      SpO2: 95% 94% 94% 93%    General: Comfortable, alert, not short of breath at rest.  HEENT: Mild clinical pallor, no jaundice, no conjunctival injection or discharge. Hydration is fair.  NECK: Supple, JVP not seen, no carotid bruits, no palpable lymphadenopathy, no palpable goiter.  CHEST: Clinically clear to auscultation, no wheezes, no crackles.  HEART: Sounds 1 and 2 heard, normal, regular, no murmurs.  ABDOMEN: Full, soft, non-tender, no palpable organomegaly, no palpable masses, normal bowel sounds. PEG site is under dressings.    GENITALIA: Not examined. .  LOWER EXTREMITIES: No pitting edema, palpable peripheral pulses.  MUSCULOSKELETAL SYSTEM: Generalized osteoarthritic changes, otherwise, normal.  CENTRAL NERVOUS SYSTEM: No focal neurologic deficit on gross examination.  Discharge Instructions     Medication List     As of 01/02/2013 12:51 PM    TAKE these medications         acetaminophen 500 MG tablet   Commonly known as: TYLENOL   Take 500 mg by mouth every 6 (six) hours as needed. For pain.      albuterol (2.5 MG/3ML) 0.083% nebulizer solution   Commonly known as: PROVENTIL   Take 3 mLs (2.5 mg total) by nebulization every 6 (six) hours as needed for wheezing.      amLODipine-benazepril 5-10 MG per capsule   Commonly known as: LOTREL   Take 1 capsule by mouth daily.      aspirin 81 MG tablet   Take 81 mg by mouth daily.      dexamethasone 4 MG tablet   Commonly known as: DECADRON   Take 4 mg by mouth 2 (two) times daily with a meal. 2 tabs by mouth BID, the day before, the day of and the day after chemotherapy      glyBURIDE-metformin  5-500 MG per tablet   Commonly known as: GLUCOVANCE   Take 2 tablets by mouth 2 (two) times daily.      HYDROcodone-acetaminophen 5-500 MG per tablet   Commonly known as: VICODIN   Take 1-2 tablets by mouth every 6 (six) hours as needed for pain. For pain.      hydrOXYzine 25 MG tablet   Commonly known as: ATARAX/VISTARIL   Take 1 tablet (25 mg total) by mouth every 4 (four) hours as needed for itching. Take 1 tablet by mouth every 8 hours as needed for itching      hydrOXYzine 25 MG tablet   Commonly known as: ATARAX/VISTARIL   TAKE 1 TABLET BY MOUTH EVERY 8 HOURS AS NEEDED FOR ITCHING      lidocaine-prilocaine cream   Commonly known as: EMLA   Apply 1 application topically as needed. Apply to [facility] A Cath site before chemotherapy.      morphine 30 MG 12 hr tablet   Commonly known as: MS CONTIN   Take 1 tablet (30 mg total) by mouth 2 (two)  times daily.      pantoprazole 40 MG tablet   Commonly known as: PROTONIX   Take 1 tablet (40 mg total) by mouth 2 (two) times daily.      PRESCRIPTION MEDICATION   Chemo Medication:  Taxotere  Physician is Dr. Tanja Port.            The results of significant diagnostics from this hospitalization (including imaging, microbiology, ancillary and laboratory) are listed below for reference.    Significant Diagnostic Studies: Ct Head Wo Contrast  12/15/2012  *RADIOLOGY REPORT*  Clinical Data: Altered mental status.  CT HEAD WITHOUT CONTRAST  Technique:  Contiguous axial images were obtained from the base of the skull through the vertex without contrast.  Comparison: CT of the head performed 10/05/2011, and MRI of the brain performed 08/23/2012  Findings:   Previously noted areas of gyriform enhancement are not well characterized on CT, and would be better followed on MRI. There is no evidence of acute hemorrhage; no acute infarct is identified.  A chronic infarct is noted at the left occipital lobe, with associated encephalomalacia.  A small chronic infarct is noted at the left frontal lobe, also demonstrating encephalomalacia. Prominence of the ventricles and sulci reflects mild cortical volume loss.  Mild cerebellar atrophy is noted.  A small chronic lacunar infarct is seen at the right cerebellar hemisphere.  Periventricular white matter change likely reflects small vessel ischemic microangiopathy.  The brainstem and fourth ventricle are within normal limits.  The basal ganglia are unremarkable in appearance.  No mass effect or midline shift is seen.  There is no evidence of fracture; visualized osseous structures are unremarkable in appearance.  The visualized portions of the orbits are within normal limits.  The paranasal sinuses and mastoid air cells are well-aerated.  No significant soft tissue abnormalities are seen.  IMPRESSION:  1.  Known areas of gyriform enhancement seen on the prior MRI  are not well characterized on CT, but may reflect tumor or infarct.  As previously recommended, a follow-up MRI should be performed when possible (as a 29-month follow-up from the prior study). 2.  Chronic infarct at the left occipital lobe, with associated encephalomalacia; small chronic infarct at the left frontal lobe, also demonstrating encephalomalacia.  Mild cortical volume loss noted.  Small chronic lacunar infarct at the right cerebellar hemisphere. 3.  Scattered small vessel ischemic microangiopathy.   Original  Report Authenticated By: Tonia Ghent, M.D.    Ct Chest Wo Contrast  12/15/2012  *RADIOLOGY REPORT*  Clinical Data: Respiratory difficulty; chest pain.  Rhonchi and rales noted bilaterally.  CT CHEST WITHOUT CONTRAST  Technique:  Multidetector CT imaging of the chest was performed following the standard protocol without IV contrast.  Comparison: Chest radiograph performed earlier today at 02:21 a.m., and CT of the chest performed 10/17/2012  Findings: There is dense consolidation involving the left lower lobe and portions of the left upper lobe, with an associated mildly loculated moderate to large left-sided pleural effusion.  Patchy right-sided atelectasis is seen.  The right lung is otherwise grossly clear.  Diffuse coronary artery calcifications are noted.  The enlarged periaortic node, measuring 1.4 cm, is grossly stable from the prior study; a smaller 1.0 cm node is again noted more posteriorly.  A right-sided chest port is noted ending about the mid to distal SVC. Additional scattered nodules are seen along the left side of mediastinum, mildly larger than on the prior study, measuring up to 0.6 cm in size, and raising question for nodal metastatic disease.  No pericardial effusion is identified.  The great vessels are grossly unremarkable in appearance.  The visualized portions of the thyroid gland are unremarkable.  No axillary lymphadenopathy is appreciated.  The patient's endotracheal  tube is seen ending 3 cm above the carina.  The enteric tube is seen coiled within the stomach.  The liver and spleen are unremarkable in appearance.  The gallbladder is unremarkable.  The visualized portions of the pancreas and adrenal glands are within normal limits.  A 4.6 cm right renal cyst is seen; a smaller 2.2 cm right renal cyst is noted.  A large 1.6 cm stone is noted at the interpole region of the right kidney.  Scattered calcification is noted along the proximal abdominal aorta, and at the origins of both renal arteries.  No acute osseous abnormalities are identified.  IMPRESSION:  1.  Dense consolidation involving the left lower lobe and portions of the left upper lobe, with an associated mildly loculated moderate to large left-sided pleural effusion.  Findings compatible with pneumonia.  Underlying mass cannot be excluded, but is not definitely characterized. 2.  Patchy right-sided atelectasis seen. 3.  Scattered small nodules along the left side of the mediastinum are mildly larger than on the prior study and raise question for nodal metastatic disease. 4.  Enlarged periaortic node again noted, grossly stable from the prior study. 5.  Diffuse coronary artery calcifications seen. 6.  Right renal cysts noted; 1.6 cm stone at the interpole region of the right kidney. 7.  Scattered calcification along the proximal aorta, and at the origins of both renal arteries.   Original Report Authenticated By: Tonia Ghent, M.D.    Mr [facility] Wo Contrast  12/21/2012  *RADIOLOGY REPORT*  Clinical Data:  78 year old hypertensive diabetic with hyperlipidemia.  Stage IV lung cancer.  Altered mental status. Intubated.  MRI BRAIN WITH AND WITHOUT CONTRAST MRA HEAD WITHOUT CONTRAST  Technique: Multiplanar, multiecho pulse sequences of the brain and surrounding structures were obtained according to standard protocol with and without intravenous contrast.  Angiographic images of the Circle of Willis were obtained using  MRA technique without intravenous contrast.  Contrast: 17 ml MultiHance.  Comparison12/28/2013 CT.  08/23/2012 and 07/07/2012 MR.  MRI HEAD  Findings: Acute/subacute small infarct posterior limb right internal capsule suspected.  Swelling of posterior left frontal lobe gyri with mild restricted motion, blood breakdown products  and mild enhancement.  This may represent subacute partially hemorrhagic infarct.  Cerebritis could present with a similar MR appearance. Tumor not entirely excluded although felt to be secondary consideration.  Remote left occipital lobe infarct with encephalomalacia and laminar necrosis.  Increase in degree of gyriform enhancement/T2 changes along the periphery of this remote infarct.  This may represent changes of subacute infarct.  Cerebritis could present with a similar MR appearance. Tumor not entirely excluded although felt to be secondary less likely consideration.  Anterior superior left frontal remote small infarct with laminar necrosis unchanged.  New although appearing remote small infarct left parietal lobe with laminar necrosis and minimal enhancement.  Previously noted small area of enhancement right cerebellum less conspicuous with small area of encephalomalacia now noted.  Developmental venous anomaly inferior aspect right cerebellum.  Prominent white matter type changes probably related to result of small vessel disease.  Global atrophy.  No hydrocephalus.  Mild spinal stenosis of the cervical spine.  Minimal paranasal sinus mucosal thickening.  IMPRESSION:  Fluctuating findings as detailed above.  It is possible these changes are related to the infarcts of the various ages and in different vascular distributions which raises possibility of embolic disease or vasculitis.  As the patient has a history of stage IV lung cancer, tumor is not entirely excluded as contributing to findings.  However, prior to radiation therapy, follow-up MR scan in 3 weeks may be considered as I  favor constellation of findings more likely related to infarction rather than tumor.  Cerebritis could not be excluded in the proper clinical setting given the degree of swelling of the posterior left frontal lobe abnormality (does not appear to be case clinically per phone conversation with Dr. Thad Ranger 12/21/2022 at to 3:00 p.m.).  MRA HEAD  Findings: Ectasia of the distal vertical cervical segment of the internal carotid artery bilaterally.  Mild irregularity and slight narrowing involving portions of the cavernous and supraclinoid segment of the internal carotid artery bilaterally.  No significant stenosis of the carotid terminus, A1 segment or M1 segment on either side.  Middle cerebral artery and A2 segment anterior cerebral artery branch vessel irregularity bilaterally.  Slight irregularity with minimal narrowing distal right vertebral artery.  Nonvisualization left PICA and both AICAs.  Mild narrowing mid aspect basilar artery.  Slight irregularity superior cerebellar artery branches.  Mild to moderate narrowing proximal left posterior cerebral artery. Poor delineation of the distal left posterior cerebral artery branches.  Mild narrowing distal right posterior cerebral artery branches.  No aneurysm or fashion malformation noted.  IMPRESSION: Intracranial atherosclerotic type changes as detailed above.   Original Report Authenticated By: Lacy Duverney, M.D.    Mr [facility] Wo Contrast  12/29/2012  *RADIOLOGY REPORT*  Clinical Data: 79 year old male with stage IV lung cancer.  Altered mental status.  Areas of abnormal enhancement in the brain since July 2013 which have been difficult to classify as tumor/metastasis versus post ischemic by MRI.  MRI HEAD WITHOUT AND WITH CONTRAST  Technique:  Multiplanar, multiecho pulse sequences of the brain and surrounding structures were obtained according to standard protocol without and with intravenous contrast  Contrast: 15mL MULTIHANCE GADOBENATE DIMEGLUMINE 529 MG/ML  IV SOLN  Comparison: 12/21/2012 and earlier.  Findings: Medial right cerebellar developmental venous anomaly again incidentally noted.  Chronic left PCA infarct and encephalomalacia.  Gyriform combination intrinsic T1 signal and mild enhancement along the lateral aspect of the left occipital lobe have decreased since earlier this month (series 10 image 28). The left  temporal operculum and gyriform enhancement persists and is more discrete and intense, but at the same time slightly less extensive (series 10 image 38). Some of the T1 intensity here reflects blood products which appear mildly increased in a petechial hemorrhage pattern.  As noted before, the anterior superior left frontal lobe and right cerebellar enhancement first identified in July has decreased and resolved respectively.  No new areas of abnormal enhancement.  Decreased gyriform diffusion abnormality corresponding to the area of left operculum enhancement.  Persistent linear restricted diffusion in the posterior limb of the right internal capsule.  No associated enhancement at the site.  Small cortically based areas of restricted diffusion persist in the lateral left occipital lobe corresponding to the decreasing gyriform enhancement.  No new diffusion abnormality.  No ventriculomegaly. No new intracranial hemorrhage. Major intracranial vascular flow voids are stable.  No new signal abnormality in the brain.  Negative pituitary and visualized cervical spine.  Normal bone marrow signal.  Stable orbits, paranasal sinuses and mastoids.  The patient now is extubated, there is no longer retained fluid in the pharynx. Stable scalp soft tissues.  IMPRESSION: 1.  No new intracranial abnormality. 2.  Complex MRI findings which I feel over this series of exams are most compatible with recurrent and acute on chronic ischemia which is most pronounced in the left MCA and PCA territories.  Slight interval increase in petechial hemorrhage associated with the left  temporal operculum lesion first identified 8 days ago. 3.  No definite intracranial metastatic disease.   Original Report Authenticated By: Erskine Speed, M.D.    Mr [facility] Wo Contrast  12/21/2012  *RADIOLOGY REPORT*  Clinical Data:  79 year old hypertensive diabetic with hyperlipidemia.  Stage IV lung cancer.  Altered mental status. Intubated.  MRI BRAIN WITH AND WITHOUT CONTRAST MRA HEAD WITHOUT CONTRAST  Technique: Multiplanar, multiecho pulse sequences of the brain and surrounding structures were obtained according to standard protocol with and without intravenous contrast.  Angiographic images of the Circle of Willis were obtained using MRA technique without intravenous contrast.  Contrast: 17 ml MultiHance.  Comparison12/28/2013 CT.  08/23/2012 and 07/07/2012 MR.  MRI HEAD  Findings: Acute/subacute small infarct posterior limb right internal capsule suspected.  Swelling of posterior left frontal lobe gyri with mild restricted motion, blood breakdown products and mild enhancement.  This may represent subacute partially hemorrhagic infarct.  Cerebritis could present with a similar MR appearance. Tumor not entirely excluded although felt to be secondary consideration.  Remote left occipital lobe infarct with encephalomalacia and laminar necrosis.  Increase in degree of gyriform enhancement/T2 changes along the periphery of this remote infarct.  This may represent changes of subacute infarct.  Cerebritis could present with a similar MR appearance. Tumor not entirely excluded although felt to be secondary less likely consideration.  Anterior superior left frontal remote small infarct with laminar necrosis unchanged.  New although appearing remote small infarct left parietal lobe with laminar necrosis and minimal enhancement.  Previously noted small area of enhancement right cerebellum less conspicuous with small area of encephalomalacia now noted.  Developmental venous anomaly inferior aspect right cerebellum.   Prominent white matter type changes probably related to result of small vessel disease.  Global atrophy.  No hydrocephalus.  Mild spinal stenosis of the cervical spine.  Minimal paranasal sinus mucosal thickening.  IMPRESSION:  Fluctuating findings as detailed above.  It is possible these changes are related to the infarcts of the various ages and in different vascular distributions which raises possibility  of embolic disease or vasculitis.  As the patient has a history of stage IV lung cancer, tumor is not entirely excluded as contributing to findings.  However, prior to radiation therapy, follow-up MR scan in 3 weeks may be considered as I favor constellation of findings more likely related to infarction rather than tumor.  Cerebritis could not be excluded in the proper clinical setting given the degree of swelling of the posterior left frontal lobe abnormality (does not appear to be case clinically per phone conversation with Dr. Thad Ranger 12/21/2022 at to 3:00 p.m.).  MRA HEAD  Findings: Ectasia of the distal vertical cervical segment of the internal carotid artery bilaterally.  Mild irregularity and slight narrowing involving portions of the cavernous and supraclinoid segment of the internal carotid artery bilaterally.  No significant stenosis of the carotid terminus, A1 segment or M1 segment on either side.  Middle cerebral artery and A2 segment anterior cerebral artery branch vessel irregularity bilaterally.  Slight irregularity with minimal narrowing distal right vertebral artery.  Nonvisualization left PICA and both AICAs.  Mild narrowing mid aspect basilar artery.  Slight irregularity superior cerebellar artery branches.  Mild to moderate narrowing proximal left posterior cerebral artery. Poor delineation of the distal left posterior cerebral artery branches.  Mild narrowing distal right posterior cerebral artery branches.  No aneurysm or fashion malformation noted.  IMPRESSION: Intracranial atherosclerotic  type changes as detailed above.   Original Report Authenticated By: Lacy Duverney, M.D.    Ir Gastrostomy Tube Mod Sed  01/01/2013  *RADIOLOGY REPORT*  Clinical Data:  History of cerebral infarction.  The patient requires a gastrostomy tube due to inability to safely ingest food orally.  PERCUTANEOUS GASTROSTOMY TUBE PLACEMENT  Sedation:  1.0 mg IV Versed; 25 mcg IV Fentanyl.  Total Moderate Sedation Time: 9 minutes.  Contrast:  20 ml Omnipaque-300  Additional Medications:  1 gram IV vancomycin was administered prior to the procedure. Vancomycin was given within two hours of incision.  Vancomycin was given due to an antibiotic allergy.  Fluoroscopy Time: 4.2 minutes.  Procedure:  The procedure, risks, benefits, and alternatives were explained to the patient.  Questions regarding the procedure were encouraged and answered.  The patient understands and consents to the procedure.  A 5-French catheter was advanced through the patient's mouth under fluoroscopy into the esophagus and to the level of the stomach. This catheter was used to insufflate the stomach with air under fluoroscopy.  The abdominal wall was prepped with Betadine in a sterile fashion, and a sterile drape was applied covering the operative field.  A sterile gown and sterile gloves were used for the procedure. Local anesthesia was provided with 1% Lidocaine.  A skin incision was made in the upper abdominal wall.  Under fluoroscopy, an 18 gauge trocar needle was advanced into the stomach.  Contrast injection was performed to confirm intraluminal position of the needle tip.  A single T tack was then deployed in the lumen of the stomach.  This was brought up to tension at the skin surface.  Over a guidewire, a 9-French sheath was advanced into the lumen of the stomach.  The wire was left in place as a safety wire.  A loop snare device from a percutaneous gastrostomy kit was then advanced into the stomach.  A floppy guide wire was advanced through the  orogastric catheter under fluoroscopy in the stomach.  The loop snare advanced through the percutaneous gastric access was used to snare the guide wire. This allowed withdrawal of  the loop snare out of the patient's mouth by retraction of the orogastric catheter and wire.  A 20-French bumper retention gastrostomy tube was looped around the snare device.  It was then pulled back through the patient's mouth. The retention bumper was brought up to the anterior gastric wall. The T tack suture was cut at the skin.  The exiting gastrostomy tube was cut to appropriate length and a feeding adapter applied. The catheter was injected with contrast material to confirm position and a fluoroscopic spot image saved.  The tube was then flushed with saline.  A dressing was applied over the gastrostomy exit site.  Complications:  None  Findings:  Initial fluoroscopy demonstrates adequate opacification of the colon by ingested barium in order to prevent colonic injury during the procedure.  The stomach distended well with air allowing safe placement of the gastrostomy tube.  After placement, the tip of the gastrostomy tube lies in the body of the stomach.  IMPRESSION: Percutaneous gastrostomy with placement of a 20-French bumper retention tube in the body of the stomach.  This tube can be used for percutaneous feeds beginning in 24 hours after placement.   Original Report Authenticated By: Irish Lack, M.D.    Dg Chest Port 1 View  12/27/2012  *RADIOLOGY REPORT*  Clinical Data: Respiratory failure and history of lung carcinoma.  PORTABLE CHEST - 1 VIEW  Comparison: 12/26/2012  Findings: The patient has been extubated.  Port-A-Cath and left- sided central line positioning are stable.  Lung volumes are slightly lower with bibasilar atelectasis remaining.  There is a potential additional component of lower lung infiltrate and pleural effusion on the left.  No overt pulmonary edema identified.  IMPRESSION: Lower lung volumes with  bibasilar atelectasis present.  Additional infiltrate and pleural effusion may be present on the left.   Original Report Authenticated By: Irish Lack, M.D.    Dg Chest Port 1 View  12/26/2012  *RADIOLOGY REPORT*  Clinical Data: Evaluate lines and endotracheal tube.  PORTABLE CHEST - 1 VIEW  Comparison: 12/25/2012.  Findings: Endotracheal tube terminates approximately 3 cm above the carina.  Nasogastric tube is followed into the stomach.  Left IJ central line tip projects over the SVC, as does a right IJ Port-A- Cath tip.  Heart size is grossly stable.  Lungs are very low in volume with left basilar airspace consolidation, stable.  Minimal right basilar airspace disease.  Probable left pleural effusion.  IMPRESSION:  1.  Persistent left basilar airspace consolidation and left pleural effusion, possibly due to pneumonia.  Follow-up to clearing is recommended. 2.  Minimal right basilar atelectasis.   Original Report Authenticated By: Leanna Battles, M.D.    Dg Chest Port 1 View  12/25/2012  *RADIOLOGY REPORT*  Clinical Data: Evaluate endotracheal tube and lines  PORTABLE CHEST - 1 VIEW  Comparison: 12/24/2012; 12/23/2012; 12/22/2012  Findings: Grossly unchanged enlarged cardiac silhouette and mediastinal contours with atherosclerotic calcifications within the aortic arch. Lung volumes remain persistently reduced.  Stable positioning of support apparatus.  No pneumothorax. Grossly unchanged trace left-sided pleural effusion and left basilar heterogeneous opacities.  Right infrahilar heterogeneous opacities are also unchanged.  No new focal airspace opacities.  Unchanged bones.  IMPRESSION: 1.  Stable positioning of support apparatus.  No pneumothorax. 2.  Unchanged small left-sided effusion and left basilar airspace opacities worrisome for infection.   Original Report Authenticated By: Tacey Ruiz, MD    Dg Chest Port 1 View  12/24/2012  *RADIOLOGY REPORT*  Clinical Data: Endotracheal  tube.  Respiratory  difficulty.  PORTABLE CHEST - 1 VIEW  Comparison: Yesterday  Findings: Endotracheal tube is 3.0 cm from the carina.  Right internal jugular vein Port-A-Cath, NG tube, left internal jugular vein central venous catheter are stable.  Airspace disease in the left mid and lower lung zones is stable.  Left pleural effusions stable.  IMPRESSION: Stable left lower lobe consolidation.   Original Report Authenticated By: Jolaine Click, M.D.    Dg Chest Port 1 View  12/23/2012  *RADIOLOGY REPORT*  Clinical Data: Check endotracheal tube.  PORTABLE CHEST - 1 VIEW  Comparison: 12/22/2012  Findings: Endotracheal tube is 3.8 cm above the carina.  Port-A- Cath tip in the SVC region.  Nasogastric tube is near the gastric pylorus.  Persistent left basilar densities are suggestive for pleural fluid and consolidation.  Left jugular central line in the left innominate vein region and unchanged.  The patient continues to have low lung volumes.  Heart is obscured by the left basilar densities.  IMPRESSION: Stable chest radiograph findings.  Persistent left basilar densities are suggestive for pleural fluid and consolidation.  Support apparatuses as described.   Original Report Authenticated By: Richarda Overlie, M.D.    Dg Chest Port 1 View  12/22/2012  *RADIOLOGY REPORT*  Clinical Data: Evaluate endotracheal tube position  PORTABLE CHEST - 1 VIEW  Comparison: Portable exam 0515 hours compared to 12/20/2012  Findings: Tip of endotracheal tube approximately 2.9 cm above carina. Nasogastric tube extends into stomach. Right jugular Port-A-Cath and left jugular line unchanged. Low lung volumes with right basilar atelectasis and atelectasis versus consolidation at left lung base. No pneumothorax. Atherosclerotic calcification aorta.  IMPRESSION: Tip of endotracheal tube approximately 2.9 cm above carina. Otherwise no change.   Original Report Authenticated By: Ulyses Southward, M.D.    Dg Chest Port 1 View  12/20/2012  *RADIOLOGY REPORT*  Clinical Data:  Line placement.  PORTABLE CHEST - 1 VIEW  Comparison: 12/20/2012.  Findings: Endotracheal tube terminates at the carina.  Left IJ central line tip projects over the left brachiocephalic vein. Right IJ Port-A-Cath tip is unchanged over the SVC.  Heart size is grossly stable.  Lungs are very low in volume with bibasilar air space disease, left greater than right, and a left pleural effusion.  No definite pneumothorax.  IMPRESSION:  1.  Endotracheal tube is low lying.  Pulling back approximately 3 cm would better position the tip above the carina. These results will be called to the ordering clinician or representative by the Radiologist Assistant, and communication documented in the PACS Dashboard. 2.  No pneumothorax after left IJ central line insertion. 3.  Bibasilar air space disease, left greater than right, and left pleural effusion.   Original Report Authenticated By: Leanna Battles, M.D.    Dg Chest Port 1 View  12/20/2012  *RADIOLOGY REPORT*  Clinical Data: Pneumonia  PORTABLE CHEST - 1 VIEW  Comparison: Yesterday  Findings: Endotracheal and NG tubes removed.  Stable right internal jugular vein Port-A-Cath.  Left lower lobe consolidation is stable. Right base atelectasis is stable.  No pneumothorax.  IMPRESSION: Extubated.  Stable left lower lobe consolidation.  Stable right base atelectasis.   Original Report Authenticated By: Jolaine Click, M.D.    Dg Chest Port 1 View  12/19/2012  *RADIOLOGY REPORT*  Clinical Data: Endotracheal tube position.  Hypertension. Diabetes.  PORTABLE CHEST - 1 VIEW  Comparison: 1 day prior  Findings: Endotracheal tube 2.3 cm above carina.  Nasogastric extends beyond the  inferior aspect  of the film.  Right-sided Port- A-Cath terminating at the mid to low SVC.  Cardiomegaly accentuated by AP portable technique.  Similar small to moderate left-sided pleural effusion. No pneumothorax. Extremely low lung volumes.  Mild pulmonary venous congestion is difficult to exclude.  Left  greater than right bibasilar airspace disease.  IMPRESSION:  1. No significant change since one day prior. 2.  Left-sided pleural effusion with right base atelectasis and left base pneumonia or atelectasis. 3.  Low lung volumes.  Cannot exclude mild pulmonary venous congestion.   Original Report Authenticated By: Jeronimo Greaves, M.D.    Dg Chest Port 1 View  12/18/2012  *RADIOLOGY REPORT*  Clinical Data: Evaluate ET tube placement  PORTABLE CHEST - 1 VIEW  Comparison: 12/17/2012  Findings: Endotracheal tube tip is above the carina.  There is a right chest wall porta-catheter with tip in the cavoatrial junction.  Nasogastric tube tip is in the stomach.  Lung volumes are low.  There is a left pleural effusion which is increased in volume from previous exam.  IMPRESSION:  1.  Increase in volume of left effusion.   Original Report Authenticated By: Signa Kell, M.D.    Dg Chest Port 1 View  12/17/2012  *RADIOLOGY REPORT*  Clinical Data: Evaluate endotracheal tube position.  PORTABLE CHEST - 1 VIEW  Comparison: Chest x-ray 12/16/2012.  Findings: An endotracheal tube is in place with tip 1 cm above the carina. A nasogastric tube is seen extending into the stomach, however, the tip of the nasogastric tube extends below the lower margin of the image.  Right internal jugular single lumen Port-A- Cath with tip terminating in the distal superior vena cava.  Lung volumes are low.  Extensive bibasilar opacities (left greater than right) may reflect areas of atelectasis and/or consolidation, with superimposed moderate left pleural effusion.  Pulmonary vascular crowding, without frank pulmonary edema.  Heart size is largely obscured, but appears within normal limits. The patient is rotated to the left on today's exam, resulting in distortion of the mediastinal contours and reduced diagnostic sensitivity and specificity for mediastinal pathology.  Atherosclerosis in the thoracic aorta.  IMPRESSION: 1.  Support apparatus, as  above. Please take note of the low lying endotracheal tube and consider withdrawal approximately 3 cm for more optimal placement. 2.  Low lung volumes with bibasilar (left much greater than right) atelectasis and/or consolidation, with superimposed moderate left pleural effusion.  These results will be called to the ordering clinician or representative by the Radiologist Assistant, and communication documented in the PACS Dashboard.   Original Report Authenticated By: Trudie Reed, M.D.    Dg Chest Port 1 View  12/16/2012  *RADIOLOGY REPORT*  Clinical Data: Evaluate ET tube placement  PORTABLE CHEST - 1 VIEW  Comparison: 12/15/2012  Findings: Right chest wall porta-catheter tip is in the SVC.  ET tube is stable with tip above carina.  There is a nasogastric tube in place.  Moderate to large left pleural effusion is unchanged from previous exam.  IMPRESSION:  1.  Moderate to large left pleural effusion.  Similar to previous exam.   Original Report Authenticated By: Signa Kell, M.D.    Dg Chest Port 1 View  12/15/2012  *RADIOLOGY REPORT*  Clinical Data: Respiratory failure.  PORTABLE CHEST - 1 VIEW  Comparison: 0220 hours  Findings: The endotracheal tube has been retracted with the tip now approximately 2 cm above the carina.  Lungs show persistent moderate to large loculated left pleural effusion.  Aeration of the  left lung is slightly improved.  Aeration on the right also is greater than on the prior film.  IMPRESSION: Endotracheal tube tip approximately 2 cm above the carina.  Some improvement in pulmonary aeration is noted bilaterally with persistent loculated left pleural effusion.   Original Report Authenticated By: Irish Lack, M.D.    Dg Chest Portable 1 View  12/15/2012  *RADIOLOGY REPORT*  Clinical Data: Assess endotracheal tube placement.  PORTABLE CHEST - 1 VIEW  Comparison: CT of the chest performed 10/17/2012, and chest radiograph performed 09/10/2012  Findings: The patient's  endotracheal tube is seen ending within the right mainstem bronchus, nearly 2 cm deep to the carina.  This should be retracted 4-5 cm.  The lungs are hypoexpanded.  There has been significant interval increase in a moderate to large left-sided pleural effusion, demonstrating mild loculation.  Associated left-sided airspace opacification is noted.  Vascular crowding is seen; the right lung is otherwise grossly unremarkable.  No pneumothorax is identified.  The cardiomediastinal silhouette is difficult to fully assess, but appears borderline normal in size.  A right-sided chest port is noted ending about the distal SVC.  No acute osseous abnormalities are identified.  IMPRESSION:  1.  Endotracheal tube seen ending within the right mainstem bronchus, nearly 2 cm deep to the carina.  This should be retracted 4-5 cm. 2.  Lungs hypoexpanded.  Significant interval increase in moderate to large left-sided pleural effusion, demonstrating mild loculation.  Associated left-sided airspace opacification noted.  These results were called by telephone on 12/15/2012 at 02:48 a.m. to Dr. Mendel Ryder, who verbally acknowledged these results.   Original Report Authenticated By: Tonia Ghent, M.D.    Dg Abd Portable 1v  12/21/2012  *RADIOLOGY REPORT*  Clinical Data: NG tube placement  PORTABLE ABDOMEN - 1 VIEW  Comparison: 12/18/2012  Findings: 0752 hours.  Bowel gas pattern is nonspecific.  NG tube tip overlies the mid stomach.  Right renal stone again noted.  IVC filter is visualized in situ and peri  IMPRESSION: NG tube tip overlies the mid stomach.   Original Report Authenticated By: Kennith Center, M.D.    Dg Abd Portable 1v  12/18/2012  *RADIOLOGY REPORT*  Clinical Data: Orogastric tube placement  PORTABLE ABDOMEN - 1 VIEW  Comparison: Abdominal radiograph performed 12/17/2012  Findings: An enteric tube is noted ending at the body of the stomach.  The visualized bowel gas pattern is grossly unremarkable.  No free  intra-abdominal air is identified, though evaluation for free air is limited on a single supine view.  An IVC filter is again noted.  The right renal stone is unchanged in appearance.  No acute osseous abnormalities are seen; mild degenerative change is noted at the lower lumbar spine.  There is a persistent small left pleural effusion, with associated airspace opacification.  IMPRESSION:  1.  Enteric tube noted ending at the body of the stomach. 2.  Unremarkable bowel gas pattern; no free intra-abdominal air seen. 3.  Persistent small left pleural effusion, with associated airspace opacification.   Original Report Authenticated By: Tonia Ghent, M.D.    Dg Abd Portable 1v  12/17/2012  *RADIOLOGY REPORT*  Clinical Data: Nausea and vomiting; assess orogastric tube placement.  PORTABLE ABDOMEN - 1 VIEW  Comparison: CT of the abdomen and pelvis performed 10/17/2012, and chest radiograph performed earlier today at 05:49 a.m.  Findings: No enteric tube is visualized, even at the level of the thoracic inlet.  It may be coiled within the patient's mouth.  The  patient's endotracheal tube is seen ending nearly 1 cm above the carina; this should be retracted 1-2 cm.  A right-sided chest port is noted ending about the distal SVC.  The lungs remain significantly hypoexpanded.  A small to moderate left-sided pleural effusion is again seen, with associated airspace opacification.  No pneumothorax is identified.  The cardiomediastinal silhouette is not well assessed due to adjacent airspace opacity, but appears borderline normal in size. No acute osseous abnormalities are identified.  The visualized bowel gas pattern is grossly unremarkable.  No free intra-abdominal air is identified, though evaluation for free air is suboptimal on supine views.  An IVC filter is noted.  A 1.4 cm right renal calcification is again seen.  IMPRESSION:  1.  No enteric tube seen overlying the chest or abdomen; it may be coiled within the  patient's mouth. 2.  Endotracheal tube seen ending nearly 1 cm above the carina; this should be retracted 1-2 cm. 3.  Lungs significantly hypoexpanded; small to moderate left-sided pleural effusion again seen, with associated airspace opacification. 4.  Unremarkable bowel gas pattern; no free intra-abdominal air seen. 5.  Right renal calcification again noted.  These results were called by telephone on 12/17/2012 at 09:52 p.m. to [facility] on [id number], who verbally acknowledged these results.   Original Report Authenticated By: Tonia Ghent, M.D.    Dg Swallowing [facility] Pathology  12/28/2012  Vivi Ferns McCoy, CCC-SLP     12/28/2012 12:39 PM Objective Swallowing Evaluation: Modified Barium Swallowing Study   Patient Details  Name: ANIKETH HUBERTY MRN: [id number] Date of Birth: Nov 13, 1929  Today's Date: 12/28/2012 Time: 4098-1191 SLP Time Calculation (min): 30 min  Past Medical History:  Past Medical History  Diagnosis Date  . Hypertension   . Diabetes mellitus   . Dyslipidemia   . Mini stroke 2005  . Degenerative disc disease   . GERD (gastroesophageal reflux disease)   . Osteopenia 2005  . Coronary artery disease 2005    treated medically  . BPH (benign prostatic hyperplasia)   . DVT (deep venous thrombosis)   . History of chemotherapy   . Diverticulosis     per scan 05/28/12  . Calculus of kidney     per scan 05/28/12  . Hx of radiation therapy 06/14/12 -07/05/12    left chest  . lung ca 11/12/2010    L[facility]   Past Surgical History:  Past Surgical History  Procedure Date  . No past surgeries   . Pleurex cath 02/08/11    left-sided pleural effusion   HPI:  79 years old male with PMH relevant for HTN, DM, dyslipidemia,  GERD, CAD, DVT. Diagnosed with metastatic (stage IV) non small  cell lung cancer in November 2011 (LUL). Treated since then with  chemotherapy. Last cycle given in October 2013. 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 Per chart review,  patient admitted with questionable chest pain and worsening  respiratory distress, started on BiPap however deteriorated  requiring intubation 12/28-1/1. BSE completed on 12/20/12 indicated  decreased management of secretions, difficutly communicating. Pt  recommended NPO, question of CVA.  MRI indicates infarct vs  metastatic disease. Pt reintubated shortly after eval until  12/26/12.  If pt does not tolerate extubation, will transitino to  comfort. Attempted to place NG tube, pt vomited.      Assessment / Plan / Recommendation Clinical Impression  Dysphagia Diagnosis: Severe  oral phase dysphagia;Severe  pharyngeal phase dysphagia (suspected severe pharyngeal  dysphagia) Clinical impression: Patient presents with a primary severe oral  phase dysphagia and a suspected moderate-severe pharyngeal  dysphagia as indicated by MBS. Oral phase characterized by  significant involvement of CN V and VII resulting in limited  lingual and labial movement and inability to orally transit  clinician provided bolus despite therapuetic intervention which  included manual placement of bolus posteriorly in oral cavity,  manually labial closure, and altering bolus size and presentation  (spoon, straw, cup, etc).  Instead, majority of bolus spilled  anteriorly. One episode of spillage of a small amount of thin  liquids noted to the level of the vallecula strickly due to bolus  consistency which elicited a delayed swallow response. Weak base  of tongue, decreased hyo-laryngeal elevation/excursion result in  inability to move bolus from vallecula into esophagus. At this  time, aspiration risk remains very high in addition to  inefficiency of po intake. SLP will plan to discuss further with  patient and family in order to determine best plan of care in  light of refusal of non-oral means of nutrition and unclear  prognosis given unclear origin of deficits (CVA vs brain mets per  notes?).    Treatment Recommendation    (TBD pending consultation with patient and family)    Diet Recommendation NPO   Medication Administration: Via alternative means    Other  Recommendations Oral Care Recommendations: Oral care QID   Follow Up Recommendations  Inpatient Rehab               General HPI: 79 years old male with PMH relevant for HTN, DM,  dyslipidemia, GERD, CAD, DVT. Diagnosed with metastatic (stage  IV) non small cell lung cancer in November 2011 (LUL). Treated  since then with chemotherapy. Last cycle given in October 2013.  He has history of a left sided malignant pleural effusion  (cytology from 01/05/12) and had a Pleurex catheter placed in the  past. Per chart review, patient admitted with questionable chest  pain and worsening respiratory distress, started on BiPap however  deteriorated requiring intubation 12/28-1/1. BSE completed on  12/20/12 indicated decreased management of secretions, difficutly  communicating. Pt recommended NPO, question of CVA.  MRI  indicates infarct vs metastatic disease. Pt reintubated shortly  after eval until 12/26/12.  If pt does not tolerate extubation,  will transitino to comfort. Attempted to place NG tube, pt  vomited.  Type of Study: Modified Barium Swallowing Study Reason for Referral: Objectively evaluate swallowing function Previous Swallow Assessment: Bedside swallow re-eval complete 1/9  indicated a continued severe dysphagia with recommendations for  MBS  Diet Prior to this Study: NPO Temperature Spikes Noted: No Respiratory Status: Supplemental O2 delivered via (comment) ([place] ) History of Recent Intubation: Yes Length of Intubations (days): 12 days Date extubated: 12/26/12 Behavior/Cognition: Alert;Cooperative;Confused (aphasic) Oral Cavity - Dentition: Edentulous Oral Motor / Sensory Function: Impaired - see Bedside swallow  eval Self-Feeding Abilities: Able to feed self Patient Positioning: Upright in chair Baseline Vocal Quality: Wet Volitional Cough: Weak Volitional Swallow: Unable to  elicit Anatomy: Within functional limits Pharyngeal Secretions: Not observed secondary MBS    Reason for Referral Objectively evaluate swallowing function   Oral Phase Oral Preparation/Oral Phase Oral Phase: Impaired Oral - Nectar Oral - Nectar Teaspoon: Left anterior bolus loss;Right anterior  bolus loss;Weak lingual manipulation;Lingual pumping;Incomplete  tongue to palate contact;Reduced posterior propulsion;Holding of  bolus;Right pocketing in lateral sulci;Left pocketing in lateral  sulci;Pocketing in anterior sulcus;Lingual/palatal  residue;Delayed oral transit (inability to orally transit bolus) Oral - Thin Oral - Thin Teaspoon: Left anterior bolus loss;Right anterior  bolus loss;Weak lingual manipulation;Lingual pumping;Incomplete  tongue to palate contact;Reduced posterior propulsion;Holding of  bolus;Right pocketing in lateral sulci;Left pocketing in lateral  sulci;Pocketing in anterior sulcus;Lingual/palatal  residue;Delayed oral transit Oral - Thin Cup: Left anterior bolus loss;Right anterior bolus  loss;Weak lingual manipulation;Lingual pumping;Incomplete tongue  to palate contact;Reduced posterior propulsion;Holding of  bolus;Right pocketing in lateral sulci;Left pocketing in lateral  sulci;Pocketing in anterior sulcus;Lingual/palatal  residue;Delayed oral transit Oral - Thin Straw: Left anterior bolus loss;Right anterior bolus  loss;Weak lingual manipulation;Lingual pumping;Incomplete tongue  to palate contact;Reduced posterior propulsion;Holding of  bolus;Right pocketing in lateral sulci;Left pocketing in lateral  sulci;Pocketing in anterior sulcus;Lingual/palatal  residue;Delayed oral transit (patient unable to draw liquids via  straw) Oral - Solids Oral - Puree: Left anterior bolus loss;Right anterior bolus  loss;Weak lingual manipulation;Lingual pumping;Incomplete tongue  to palate contact;Reduced posterior propulsion;Holding of  bolus;Right pocketing in lateral sulci;Left pocketing in lateral   sulci;Pocketing in anterior sulcus;Lingual/palatal  residue;Delayed oral transit   Pharyngeal Phase Pharyngeal Phase Pharyngeal Phase: Impaired Pharyngeal - Nectar Pharyngeal - Nectar Teaspoon:  (bolus did not reach pharynx for  initiation of swallow) Pharyngeal - Thin Pharyngeal - Thin Teaspoon:  (bolus did not reach pharynx for  initiation of swallow) Pharyngeal - Thin Cup:  (bolus did not reach pharynx for  initiation of swallow) Pharyngeal - Thin Straw: Premature spillage to valleculae;Delayed  swallow initiation;Reduced anterior laryngeal mobility;Reduced  laryngeal elevation;Reduced airway/laryngeal closure;Reduced  tongue base retraction;Pharyngeal residue - valleculae Pharyngeal - Solids Pharyngeal - Puree:  (bolus did not reach pharynx for initiation  of swallow)  Cervical Esophageal Phase    GO   Leah McCoy MA, CCC-SLP [phone]  Cervical Esophageal Phase Cervical Esophageal Phase:  (n/a; bolus did not reach esophagus)         McCoy Leah Meryl 12/28/2012, 12:38 PM      Microbiology: No results found for this or any previous visit (from the past 240 hour(s)).   Labs: Basic Metabolic Panel:  Lab 01/02/13 0981 01/01/13 0547 12/31/12 0625 12/29/12 0500 12/28/12 0503  NA 136 142 141 148* 147*  K 4.0 3.9 4.2 4.1 4.0  CL 104 110 108 109 105  CO2 22 23 24 27 29   GLUCOSE 136* 77 213* 176* 149*  BUN 7 10 13  34* 40*  CREATININE 0.95 0.98 1.01 1.06 1.10  CALCIUM 9.4 9.5 9.6 9.9 10.2  MG -- -- -- -- --  PHOS -- -- -- -- --   Liver Function Tests:  Lab 12/29/12 0500  AST 15  ALT 31  ALKPHOS 109  BILITOT 0.4  PROT 7.4  ALBUMIN 3.2*   No results found for this basename: LIPASE:5,AMYLASE:5 in the last 168 hours No results found for this basename: AMMONIA:5 in the last 168 hours CBC:  Lab 12/29/12 0500 12/28/12 0503 12/27/12 0417  WBC 7.6 9.2 10.9*  NEUTROABS -- -- --  HGB 11.0* 11.3* 11.5*  HCT 36.3* 37.5* 38.4*  MCV 87.7 87.8 87.3  PLT 371 391 351   Cardiac Enzymes: No  results found for this basename: CKTOTAL:5,CKMB:5,CKMBINDEX:5,TROPONINI:5 in the last 168 hours BNP: BNP (last 3 results)  Basename 12/15/12 0247 09/10/12 1230 01/18/12 1020  PROBNP 95.3 125.6 68.7   CBG:  Lab 01/02/13 1131 01/02/13 0601 01/02/13 0008 01/01/13 2256 01/01/13 1806  GLUCAP 131* 134* 153* 153* 135*  Signed:  Wylee Dorantes,CHRISTOPHER  [facility] 01/02/2013, 12:51 PM

## 2013-01-02 NOTE — Progress Notes (Signed)
Pt admitted to room 4008, pt is alert, non verbal asphasic, wife and nephew present at bedside, pt oriented to room, rehab schedule and safety plan, pt resting in bed with call bell in reach

## 2013-01-02 NOTE — Progress Notes (Signed)
Speech Language Pathology Note  Patient Details  Name: TANVEER BRAMMER MRN: [id number] Date of Birth: 08-30-1929 Today's Date: 01/02/2013  Treatment session focused on patient's defiict of expressive communication. SLP provided visual and demonstration cues to direct patient to utilize communication board (letters, numbers, pictures), as well as 2-3 word written expression, and gestures to respond to open-ended biographical and immediate needs-based questions.Patient's accuracy with response to yes/no questions appeared to be at or above 80% accurate . He followed SLP's verbal questioning cues to verify his written statements by yes/no response. Patient's handwriting was difficult to decipher, secondary to illlegibility as well as transpositional errors at times, however patient able to write phrases such as 'eyes bad', 'wife stays home'.    Elio Forget Tarrell 01/02/2013, 5:19 PM   Angela Nevin, MA, CCC-SLP [facility] Speech-Language Pathologist

## 2013-01-02 NOTE — Plan of Care (Signed)
Problem: RH SKIN INTEGRITY Goal: RH STG SKIN FREE OF INFECTION/BREAKDOWN Skin will remain free of breakdown with  Min assist

## 2013-01-02 NOTE — Clinical Social Work Note (Signed)
Clinical Social Work   CSW met with pt and pt's family to address discharge plan. Pt has been accepted by CIR and will be admitted today. CSW confirmed this with CIR admissions coordinator. Pt and family are agreeable to discharge plan. CSW is signing off as no further needs identified.   Dede Query, MSW, Theresia Majors [phone]

## 2013-01-02 NOTE — Progress Notes (Signed)
Admitting patient to CIR today. Dr Brien Few reports pt is stable and ready to d/c to CIR. Met with pt and his wife and nephew this AM. They are in agreement with plan to go to inpatient rehab today.  Pt's RN, CSW and CM are aware of plan. Please call for questions: [phone]

## 2013-01-02 NOTE — Progress Notes (Signed)
Subjective: G tube placed 1/14   Objective: Vital signs in last 24 hours: Temp:  [97.7 F (36.5 C)-98.8 F (37.1 C)] 98.4 F (36.9 C) (01/15 0557) Pulse Rate:  [98-115] 110  (01/15 0557) Resp:  [10-19] 18  (01/15 0557) BP: (129-161)/(79-99) 141/96 mmHg (01/15 0557) SpO2:  [94 %-98 %] 94 % (01/15 0557) Last BM Date: 12/25/12  Intake/Output from previous day: 01/14 0701 - 01/15 0700 In: -  Out: 350 [Urine:350] Intake/Output this shift:    PE: afeb Resting Site clean and dry; no bleeding +BS  Lab Results:  No results found for this basename: WBC:2,HGB:2,HCT:2,PLT:2 in the last 72 hours BMET  Basename 01/02/13 0500 01/01/13 0547  NA 136 142  K 4.0 3.9  CL 104 110  CO2 22 23  GLUCOSE 136* 77  BUN 7 10  CREATININE 0.95 0.98  CALCIUM 9.4 9.5   PT/INR  Basename 01/01/13 0547  LABPROT 14.8  INR 1.18   ABG No results found for this basename: PHART:2,PCO2:2,PO2:2,HCO3:2 in the last 72 hours  Studies/Results: Ir Gastrostomy Tube Mod Sed  01/01/2013  *RADIOLOGY REPORT*  Clinical Data:  History of cerebral infarction.  The patient requires a gastrostomy tube due to inability to safely ingest food orally.  PERCUTANEOUS GASTROSTOMY TUBE PLACEMENT  Sedation:  1.0 mg IV Versed; 25 mcg IV Fentanyl.  Total Moderate Sedation Time: 9 minutes.  Contrast:  20 ml Omnipaque-300  Additional Medications:  1 gram IV vancomycin was administered prior to the procedure. Vancomycin was given within two hours of incision.  Vancomycin was given due to an antibiotic allergy.  Fluoroscopy Time: 4.2 minutes.  Procedure:  The procedure, risks, benefits, and alternatives were explained to the patient.  Questions regarding the procedure were encouraged and answered.  The patient understands and consents to the procedure.  A 5-French catheter was advanced through the patient's mouth under fluoroscopy into the esophagus and to the level of the stomach. This catheter was used to insufflate the stomach  with air under fluoroscopy.  The abdominal wall was prepped with Betadine in a sterile fashion, and a sterile drape was applied covering the operative field.  A sterile gown and sterile gloves were used for the procedure. Local anesthesia was provided with 1% Lidocaine.  A skin incision was made in the upper abdominal wall.  Under fluoroscopy, an 18 gauge trocar needle was advanced into the stomach.  Contrast injection was performed to confirm intraluminal position of the needle tip.  A single T tack was then deployed in the lumen of the stomach.  This was brought up to tension at the skin surface.  Over a guidewire, a 9-French sheath was advanced into the lumen of the stomach.  The wire was left in place as a safety wire.  A loop snare device from a percutaneous gastrostomy kit was then advanced into the stomach.  A floppy guide wire was advanced through the orogastric catheter under fluoroscopy in the stomach.  The loop snare advanced through the percutaneous gastric access was used to snare the guide wire. This allowed withdrawal of the loop snare out of the patient's mouth by retraction of the orogastric catheter and wire.  A 20-French bumper retention gastrostomy tube was looped around the snare device.  It was then pulled back through the patient's mouth. The retention bumper was brought up to the anterior gastric wall. The T tack suture was cut at the skin.  The exiting gastrostomy tube was cut to appropriate length and a feeding adapter  applied. The catheter was injected with contrast material to confirm position and a fluoroscopic spot image saved.  The tube was then flushed with saline.  A dressing was applied over the gastrostomy exit site.  Complications:  None  Findings:  Initial fluoroscopy demonstrates adequate opacification of the colon by ingested barium in order to prevent colonic injury during the procedure.  The stomach distended well with air allowing safe placement of the gastrostomy tube.   After placement, the tip of the gastrostomy tube lies in the body of the stomach.  IMPRESSION: Percutaneous gastrostomy with placement of a 20-French bumper retention tube in the body of the stomach.  This tube can be used for percutaneous feeds beginning in 24 hours after placement.   Original Report Authenticated By: Irish Lack, M.D.     Anti-infectives:   Assessment/Plan: s/p * No surgery found *  Perc Gastric tube placed by IR 1/14  all is well May use now  LOS: 18 days    Johnathan Bowman A 01/02/2013

## 2013-01-02 NOTE — PMR Pre-admission (Signed)
PMR Admission Coordinator Pre-Admission Assessment  Patient: Johnathan Bowman is an 79 y.o., male MRN: [id number] DOB: 07/29/1929 Height: 5\' 7"  (170.2 cm) Weight: 76.8 kg (169 lb 5 oz)             Insurance Information PRIMARY: Medicare      Policy#: [id number]      Subscriber: self Benefits:    Name: [employer] Date: 10/19/1996     Deduct: $1126      Out of Pocket Max: none      Life Max: none CIR: 100%      SNF: 100 days Outpatient: 80/20% Home Health: 100%       DME: 80/20% Providers: pt's choice  Emergency Contact Information Contact Information    Name Relation Home Work Mobile   Dillon,Lorraine Spouse [phone]     [place]   [phone]     Current Medical History  Patient Admitting Diagnosis: Left MCA,PCA infarcts  History of Present Illness: 79 y.o. male with history of DM, stage IV metastatic lung cancer with history of malignant pleural effusion requiring Pleurex cath in the past; admitted on 12/15/12 with AMS changes with progressive SOB and chills. He required intubation in ED due to hypoxia and metabolic acidosis. He was started on antibiotics for HCAP. CT head negative. CXR with loculated malignant left effusion--being monitored as patient with improvement. Patient with mutism and facial weakness. Neurology consulted and MRI of brain done showing Suspicion of acute/subacute infarct in right internal capsule-tumor not entirely excluded. Repeat MRI in 7 days recommended. EEG with mild slowing and question of metabolic encephalopathy. Goals of care discussed with family and no PEG/no trach decided. Extubated on 01/08. BSS done and NPO recommended. Patient unable to tolerate NGT but indicating hunger. He continues to have difficulty handling oral secretions needing frequent suctioning. Family has decided on placing PEG and to be done by IVR. Repeat MRI brain on 01/11 reveals recurrent acute on chronic ischemia most pronounced in left MCA and PCA territories with slight  increase in petechial hemorrhage associated.   Total: 7 =NIH   Past Medical History  Past Medical History  Diagnosis Date  . Hypertension   . Diabetes mellitus   . Dyslipidemia   . Mini stroke 2005  . Degenerative disc disease   . GERD (gastroesophageal reflux disease)   . Osteopenia 2005  . Coronary artery disease 2005    treated medically  . BPH (benign prostatic hyperplasia)   . DVT (deep venous thrombosis)   . History of chemotherapy   . Diverticulosis     per scan 05/28/12  . Calculus of kidney     per scan 05/28/12  . Hx of radiation therapy 06/14/12 -07/05/12    left chest  . lung ca 11/12/2010    LUL   Family History  family history is not on file.  Prior Rehab/Hospitalizations: none   Current Medications  Current facility-administered medications:acetaminophen (TYLENOL) suppository 650 mg, 650 mg, Rectal, Q4H PRN, Alyson Reedy, MD, 650 mg at 12/31/12 2126;  albuterol (PROVENTIL) (5 MG/ML) 0.5% nebulizer solution 2.5 mg, 2.5 mg, Nebulization, Q2H PRN, Lonia Blood, MD;  albuterol (PROVENTIL) (5 MG/ML) 0.5% nebulizer solution 2.5 mg, 2.5 mg, Nebulization, TID, Laveda Norman, MD, 2.5 mg at 01/02/13 0810 antiseptic oral rinse (BIOTENE) solution 15 mL, 15 mL, Mouth Rinse, QID, Alyson Reedy, MD, 15 mL at 01/02/13 0400;  aspirin suppository 300 mg, 300 mg, Rectal, Daily, Nelda Bucks, MD, 300 mg at 01/01/13  1023;  bisacodyl (DULCOLAX) suppository 10 mg, 10 mg, Rectal, Daily PRN, Nelda Bucks, MD, 10 mg at 12/24/12 1413;  chlorhexidine (PERIDEX) 0.12 % solution 15 mL, 15 mL, Mouth Rinse, BID, Alyson Reedy, MD, 15 mL at 01/01/13 1929 feeding supplement (JEVITY 1.2 CAL) liquid 1,000 mL, 1,000 mL, Per Tube, Continuous, Heather Cornelison Pitts, RD;  feeding supplement (PRO-STAT SUGAR FREE 64) liquid 30 mL, 30 mL, Per Tube, Daily, Heather Cornelison Pitts, RD;  fentaNYL (SUBLIMAZE) injection 25-50 mcg, 25-50 mcg, Intravenous, Q2H PRN, Alyson Reedy, MD, 25 mcg at  01/01/13 1927;  free water 200 mL, 200 mL, Per Tube, Q6H, Heather Cornelison Pitts, RD insulin aspart (novoLOG) injection 0-15 Units, 0-15 Units, Subcutaneous, Q6H, Lonia Blood, MD, 2 Units at 01/02/13 0604;  ipratropium (ATROVENT) nebulizer solution 0.5 mg, 0.5 mg, Nebulization, TID, Laveda Norman, MD, 0.5 mg at 01/02/13 0810;  morphine 2 MG/ML injection 2 mg, 2 mg, Intravenous, Q4H PRN, Laveda Norman, MD, 2 mg at 01/02/13 0442 pantoprazole (PROTONIX) injection 40 mg, 40 mg, Intravenous, Q24H, Standley Brooking, MD, 40 mg at 01/01/13 1022;  sodium chloride 0.9 % injection 10-40 mL, 10-40 mL, Intracatheter, PRN, Lonia Blood, MD, 10 mL at 01/02/13 0454  Patients Current Diet: NPO  Precautions / Restrictions Precautions Precautions: Fall Precaution Comments: dysphagia aphasia Restrictions Weight Bearing Restrictions: No   Prior Activity Level Limited Community (1-2x/wk): For appointments only Home Assistive Devices / Equipment Home Assistive Devices/Equipment: None  Prior Functional Level Prior Function Level of Independence: Independent (but limited.) Able to Take Stairs?: Yes Driving: No (Last drove 4wks ago. ) Vocation: Retired Comments: per pt report  Current Functional Level Cognition  Arousal/Alertness: Awake/alert Overall Cognitive Status: Impaired Overall Cognitive Status: Appears within functional limits for tasks assessed/performed Orientation Level: Other (comment) (UTA-pt is nonverbal) Attention: Focused;Sustained;Selective Focused Attention: Appears intact Sustained Attention: Appears intact Memory:  (UTA) Awareness: Impaired Awareness Impairment: Anticipatory impairment;Emergent impairment (continues to try and write though mostly unintelligible) Problem Solving: Impaired Problem Solving Impairment:  (Did not asses due to arrival of PT, TBD) Behaviors: Impulsive Safety/Judgment: Impaired    Extremity Assessment (includes Sensation/Coordination)  RUE  ROM/Strength/Tone: Deficits (generalized weakness) RUE Sensation: WFL - Light Touch;WFL - Proprioception RUE Coordination: WFL - gross/fine motor  RLE ROM/Strength/Tone: Within functional levels RLE Sensation: WFL - Light Touch RLE Coordination: Deficits (mild coordination deficits)    ADLs  Eating/Feeding: NPO Grooming: Minimal assistance Where Assessed - Grooming: Unsupported sitting Upper Body Bathing: Minimal assistance Where Assessed - Upper Body Bathing: Unsupported sitting Lower Body Bathing: Moderate assistance Where Assessed - Lower Body Bathing: Supported sit to stand Upper Body Dressing: Minimal assistance Where Assessed - Upper Body Dressing: Unsupported sitting Lower Body Dressing: Maximal assistance Where Assessed - Lower Body Dressing: Supported sit to stand Toilet Transfer: Minimal assistance Toilet Transfer Method: Sit to Barista: Other (comment) (bed - chair) Toileting - Clothing Manipulation and Hygiene: Moderate assistance Where Assessed - Toileting Clothing Manipulation and Hygiene: Standing Equipment Used: Gait belt Transfers/Ambulation Related to ADLs: min A ADL Comments: good safety judgement during ADL. Aware of drooling and reaching for towelq    Mobility  Bed Mobility: Supine to Sit;Sitting - Scoot to Edge of Bed Supine to Sit: HOB elevated;5: Supervision;With rails Sitting - Scoot to Edge of Bed: 5: Supervision;With rail    Transfers  Transfers: Sit to Stand;Stand to Sit Sit to Stand: 4: Min assist;With upper extremity assist;From bed Sit to Stand: Patient Percentage: 70% Stand to  Sit: 4: Min assist;With upper extremity assist;To chair/3-in-1 Stand to Sit: Patient Percentage: 70% Stand Pivot Transfers: 1: +2 Total assist Stand Pivot Transfers: Patient Percentage: 70%    Ambulation / Gait / Stairs / Wheelchair Mobility  Ambulation/Gait Ambulation/Gait Assistance: 4: Min assist Ambulation/Gait: Patient Percentage:  60% Ambulation Distance (Feet): 30 Feet Assistive device: Rolling walker Ambulation/Gait Assistance Details: minA for walker management, v/c's to stay in walker Gait Pattern: Step-through pattern;Decreased stride length Gait velocity: decreased General Gait Details: SPO2 95% on RA during amb and exercises Stairs: No    Posture / Balance Static Sitting Balance Static Sitting - Balance Support: No upper extremity supported;Feet supported Static Sitting - Level of Assistance: 7: Independent Static Sitting - Comment/# of Minutes: EOB ~ 4 minutes as IV untangled from front and back gown; pt very patient and making no attempts to stand before asked to Static Standing Balance Static Standing - Balance Support: Left upper extremity supported Static Standing - Level of Assistance: 4: Min assist Static Standing - Comment/# of Minutes: 1 minute, wide base of support; no sway    Special needs/care consideration BiPAP/CPAP - no CPM- no Continuous Drip IV - yes Dialysis- no        Life Vest- no Oxygen:  Yes -- Used O2 at home PTA Special Bed- no Trach Size- no Wound Vac (area) - no  Skin: PEG placed 1/14  /  Blister on (L) abd from EKG lead. Foam dressing.                       Bowel mgmt: LBM 1/11  continent Bladder mgmt: incontinent/condom cath Diabetic mgmt - Yes PEG placed 1/14 - Family needs education NPO Pt suctions self with [facility]   Previous Home Environment Living Arrangements: Home with spouse and son  Lives With: Spouse;Son Available Help at Discharge: Family Type of Home: House Home Layout: Two level Alternate Level Stairs-Rails: Right Alternate Level Stairs-Number of Steps: 2 flights Home Access: Stairs to enter Entrance Stairs-Rails: None Entrance Stairs-Number of Steps: 1 Bathroom Shower/Tub: Engineer, manufacturing systems: Standard Bathroom Accessibility: Yes How Accessible: Accessible via walker Home Care Services: Yes Type of Home Care Services: Wife reports  therapist (? )/ nurse (?) come to home.  Home Care Agency (if known): [facility] (?) Additional Comments: Wife unsure of details.  Discharge Living Setting Plans for Discharge Living Setting: Patient's home Type of Home at Discharge: House Discharge Home Layout: Two level Alternate Level Stairs-Rails: Right Alternate Level Stairs-Number of Steps: 2 flights Discharge Home Access: Stairs to enter Entrance Stairs-Rails: None Entrance Stairs-Number of Steps: 1 Discharge Bathroom Shower/Tub: Tub/shower unit Discharge Bathroom Toilet: Standard Discharge Bathroom Accessibility: Yes How Accessible: Accessible via walker Do you have any problems obtaining your medications?: No  Social/Family/Support Systems Patient Roles: Spouse;Parent Contact Information: [phone] Anticipated Caregiver: Wife & son Anticipated Caregiver's Contact Information: Jarrick Fjeld: [phone] Ability/Limitations of Caregiver: Poor vision. No lifting/Limited ability. Son is limited as well. Caregiver Availability: 24/7 Discharge Plan Discussed with Primary Caregiver: Yes Is Caregiver In Agreement with Plan?: Yes Does Caregiver/Family have Issues with Lodging/Transportation while Pt is in Rehab?: No  Goals/Additional Needs Patient/Family Goal for Rehab: Supervision - Min Assist overall Expected length of stay: 2 weeks Cultural Considerations: none Dietary Needs: NPO. PEG feedings. Equipment Needs: TBD Special Service Needs: TBD Additional Information: Do not share infor with pt's daughter: Lomalee. Pt/Family Agrees to Admission and willing to participate: Yes Program Orientation Provided & Reviewed with Pt/Caregiver Including Roles  &  Responsibilities: Yes Additional Information Needs: PEG tube feedings education for family.  Decrease burden of Care through IP rehab admission: Decrease number of caregivers, Bowel and bladder program and Patient/family education  Possible need for SNF placement upon discharge: Not  planned  Patient Condition: This patient's condition remains as documented in the Consult dated 01/01/13, in which the Rehabilitation Physician determined and documented that the patient's condition is appropriate for intensive rehabilitative care in an inpatient rehabilitation facility. Pt is s/p PEG placement on 01/01/13. Dr Brien Few reports pt stable for d/c to CIR. , Delle Reining, CIR PA examined patient  this AM prior to admission.  Preadmission Screen Completed By:  Meryl Dare, 01/02/2013 11:12 AM ______________________________________________________________________   Discussed status with Dr. Riley Kill on 01/02/13 at 11:30 AM and received telephone approval for admission today.  Admission Coordinator:  Meryl Dare, time 11:39 AM/Date 01/02/13

## 2013-01-02 NOTE — Plan of Care (Signed)
Overall Plan of Care [facility]) Patient Details Name: Johnathan Bowman MRN: [id number] DOB: [birth year]  Diagnosis: thrombotic left PCA and MCA infarcts   Co-morbidities: anxiety, diabetes, lung ca, htn  Functional Problem List  Patient demonstrates impairments in the following areas: Balance, Cognition, Endurance, Linguistic, Motor, Nutrition and Vision  Basic ADL's: grooming, bathing, dressing and toileting Advanced ADL's:  n/a  Transfers:  bed mobility, bed to chair, toilet, tub/shower, car and furniture Locomotion:  ambulation and stairs  Additional Impairments:  Functional use of upper extremity, Swallowing, Communication  comprehension and expression, Social Cognition   problem solving and awareness, Leisure Awareness and Discharge Disposition  Anticipated Outcomes Item Anticipated Outcome  Eating/Swallowing  Total A  Basic self-care  supervision  Tolieting  supervision  Bowel/Bladder  Pt will be continent of bowel and bladder with min assist  Transfers  supervision  Locomotion  supervision  Communication  Min A via multimodal communication  Cognition  Supervision  Pain  Pt will rate pain less than 3 on scale of 0-10 with min assist  Safety/Judgment  Pt will remain free from falls during admission with min assist  Other  Skin will remain intact with min assist   Therapy Plan:   OT Intensity: Minimum of 1-2 x/day, 45 to 90 minutes OT Frequency: 5 out of 7 days SLP Intensity: Minumum of 1-2 x/day, 30 to 90 minutes SLP Frequency: 5 out of 7 days SLP Duration/Estimated Length of Stay: 1 week    Team Interventions: Item RN PT OT SLP SW TR Other  Self Care/Advanced ADL Retraining   x      Neuromuscular Re-Education  x x      Therapeutic Activities  x x x     UE/LE Strength Training/ROM  x x      UE/LE Coordination Activities  x x      Visual/Perceptual Remediation/Compensation   x      DME/Adaptive Equipment Instruction  x x      Therapeutic Exercise  x x        Balance/Vestibular Training  x x      Patient/Family Education  x x x     Cognitive Remediation/Compensation   x x     Functional Mobility Training  x       Ambulation/Gait Training  x       Museum/gallery curator  x       Wheelchair Propulsion/Positioning  x       Functional Tourist information centre manager Reintegration  x x      Dysphagia/Aspiration Printmaker    x     Speech/Language Facilitation    x     Bladder Management x        Bowel Management x        Disease Management/Prevention         Pain Management x x x      Medication Management x        Skin Care/Wound Management   x      Splinting/Orthotics  x       Discharge Planning  x x x     Psychosocial Support x  x x                            Team Discharge Planning: Destination: PT-  ,OT-   , SLP- (TBD) Projected Follow-up: PT- , OT-  Home health OT, SLP-24  hour supervision/assistance (TBD) Projected Equipment Needs: PT- , OT-  , SLP-None recommended by SLP Patient/family involved in discharge planning: PT-  ,  OT-Patient, SLP-Patient  MD ELOS: one week Medical Rehab Prognosis:  Good Assessment: The patient has been admitted for CIR therapies. The team will be addressing, functional mobility, strength, stamina, balance, safety, adaptive techniques/equipment, self-care, bowel and bladder mgt, patient and caregiver education, cognition, communication, swallowing, nutrition, aspiration education,. Goals have been set at supervision for basic mobility and self-care, min assist for language/communication and total assist for swallowing. Pt is an extreme aspiration risk, and the team is taking extra precaution as such..      See Team Conference Notes for weekly updates to the plan of care

## 2013-01-03 ENCOUNTER — Inpatient Hospital Stay (HOSPITAL_COMMUNITY): Payer: Medicare Other | Admitting: Physical Therapy

## 2013-01-03 ENCOUNTER — Inpatient Hospital Stay (HOSPITAL_COMMUNITY): Payer: Medicare Other | Admitting: Speech Pathology

## 2013-01-03 ENCOUNTER — Inpatient Hospital Stay (HOSPITAL_COMMUNITY): Payer: Medicare Other | Admitting: Occupational Therapy

## 2013-01-03 DIAGNOSIS — C3490 Malignant neoplasm of unspecified part of unspecified bronchus or lung: Secondary | ICD-10-CM

## 2013-01-03 DIAGNOSIS — E1165 Type 2 diabetes mellitus with hyperglycemia: Secondary | ICD-10-CM

## 2013-01-03 DIAGNOSIS — I10 Essential (primary) hypertension: Secondary | ICD-10-CM

## 2013-01-03 DIAGNOSIS — I6330 Cerebral infarction due to thrombosis of unspecified cerebral artery: Secondary | ICD-10-CM

## 2013-01-03 DIAGNOSIS — I69991 Dysphagia following unspecified cerebrovascular disease: Secondary | ICD-10-CM

## 2013-01-03 LAB — COMPREHENSIVE METABOLIC PANEL
ALT: 13 U/L (ref 0–53)
AST: 13 U/L (ref 0–37)
CO2: 24 mEq/L (ref 19–32)
Calcium: 9.2 mg/dL (ref 8.4–10.5)
Sodium: 138 mEq/L (ref 135–145)
Total Protein: 6.3 g/dL (ref 6.0–8.3)

## 2013-01-03 LAB — CBC WITH DIFFERENTIAL/PLATELET
Basophils Absolute: 0 10*3/uL (ref 0.0–0.1)
Eosinophils Absolute: 0.3 10*3/uL (ref 0.0–0.7)
Eosinophils Relative: 6 % — ABNORMAL HIGH (ref 0–5)
Lymphocytes Relative: 24 % (ref 12–46)
MCV: 86.4 fL (ref 78.0–100.0)
Neutrophils Relative %: 61 % (ref 43–77)
Platelets: 332 10*3/uL (ref 150–400)
RDW: 14.4 % (ref 11.5–15.5)
WBC: 5.3 10*3/uL (ref 4.0–10.5)

## 2013-01-03 LAB — GLUCOSE, CAPILLARY
Glucose-Capillary: 178 mg/dL — ABNORMAL HIGH (ref 70–99)
Glucose-Capillary: 121 mg/dL — ABNORMAL HIGH (ref 70–99)

## 2013-01-03 MED ORDER — JEVITY 1.2 CAL PO LIQD
1000.0000 mL | ORAL | Status: DC
  Filled 2013-01-03 (×9): qty 1000

## 2013-01-03 MED ORDER — PANTOPRAZOLE SODIUM 40 MG PO PACK
40.0000 mg | PACK | Freq: Every day | ORAL | Status: DC
  Administered 2013-01-03 – 2013-01-16 (×14): 40 mg
  Filled 2013-01-03 (×17): qty 20

## 2013-01-03 MED ORDER — SODIUM CHLORIDE 0.9 % IJ SOLN
10.0000 mL | INTRAMUSCULAR | Status: DC | PRN
  Administered 2013-01-03 – 2013-01-10 (×7): 10 mL

## 2013-01-03 MED ORDER — JEVITY 1.2 CAL PO LIQD
350.0000 mL | Freq: Every day | ORAL | Status: DC
  Administered 2013-01-03: 350 mL
  Administered 2013-01-03: 50 mL
  Administered 2013-01-03: 100 mL
  Administered 2013-01-04 – 2013-01-08 (×23): 350 mL
  Administered 2013-01-08: 125 mL
  Administered 2013-01-09 – 2013-01-16 (×37): 350 mL
  Filled 2013-01-03 (×80): qty 474

## 2013-01-03 NOTE — Progress Notes (Signed)
Physical Therapy Note  Patient Details  Name: Johnathan Bowman MRN: [id number] Date of Birth: 1929-05-01 Today's Date: 01/03/2013  Time: 1330-1357 27 minutes  No c/o pain.  Car transfer training to simulated [employer] with steadying assist with RW and cuing for safety.  Pt able to communicate (write) that he has Zenaida Niece at home.  Gait training focus on balance with turns with min A for steadying.  Pt able to gait 150', 100' before fatigued.  Session performed on 2L O2 with spO2 91-96% throughout.  Individual therapy   [name] 01/03/2013, 1:57 PM

## 2013-01-03 NOTE — Evaluation (Signed)
Speech Language Pathology Assessment and Plan  Patient Details  Name: Johnathan Bowman MRN: [id number] Date of Birth: Nov 14, 1929  SLP Diagnosis: Aphasia;Dysarthria;Apraxia;Dysphagia;Cognitive Impairments;Speech and Language deficits;Voice disorder  Rehab Potential: Fair ELOS: 1 week due to high functional mobility  Today's Date: 01/03/2013 Time: 0930-1030 Time Calculation (min): 60 min  Skilled Therapeutic Intervention: Administered cognitive-linguistic evaluation. Please see below for details. Educated pt on current impairments and goals of skilled SLP intervention. Pt verbalized understanding.    Problem List:  Patient Active Problem List  Diagnosis  . PLEURAL EFFUSION, LEFT  . PULMONARY NODULE, LEFT UPPER LOBE  . ADENOCARCIMONA, LUNG, NONSMALL CELL  . DVT of leg (deep venous thrombosis)  . DVT (deep venous thrombosis)  . Cancer  . Hx of radiation therapy  . CAP (community acquired pneumonia)  . Hypoxia  . Hypercoagulopathy  . UTI (lower urinary tract infection)  . Hematuria  . Lower extremity edema  . Hypotension  . Altered mental status  . Facial weakness  . Mutism  . Lung cancer  . Acute respiratory failure  . COPD (chronic obstructive pulmonary disease)  . CVA (cerebral infarction)  . Aphasia  . Dysphagia  . Diabetes mellitus  . GERD (gastroesophageal reflux disease)   Past Medical History:  Past Medical History  Diagnosis Date  . Hypertension   . Diabetes mellitus   . Dyslipidemia   . Mini stroke 2005  . Degenerative disc disease   . GERD (gastroesophageal reflux disease)   . Osteopenia 2005  . Coronary artery disease 2005    treated medically  . BPH (benign prostatic hyperplasia)   . DVT (deep venous thrombosis)   . History of chemotherapy   . Diverticulosis     per scan 05/28/12  . Calculus of kidney     per scan 05/28/12  . Hx of radiation therapy 06/14/12 -07/05/12    left chest  . lung ca 11/12/2010    LUL   Past Surgical History:  Past  Surgical History  Procedure Date  . No past surgeries   . Pleurex cath 02/08/11    left-sided pleural effusion    Assessment / Plan / Recommendation Clinical Impression  Pt is an 79 y.o. male with history of DM, stage IV metastatic lung cancer with history of malignant pleural effusion requiring Pleurex cath in the past; admitted on 12/15/12 with AMS changes with progressive SOB and chills. He required intubation in ED due to hypoxia and metabolic acidosis. He was started on antibiotics for HCAP. CT head negative. CXR with loculated malignant left effusion--being monitored as patient with improvement. Patient with mutism and facial weakness. Neurology consulted and MRI of brain done showing suspicion of acute/subacute infarct in right internal capsule-tumor not entirely excluded. Repeat MRI in 7 days recommended. EEG with mild slowing and question of metabolic encephalopathy. Goals of care discussed with family and no PEG/no trach decided initially. Extubated on 01/08. BSS done and NPO recommended. Patient unable to tolerate NGT but indicating hunger. He continues to have difficulty handling oral secretions needing frequent suctioning. Repeat MRI brain on 01/11 reveals recurrent acute on chronic ischemia most pronounced in left MCA and PCA territories with slight increase in petechial hemorrhage associated with left temporal operculum lesion. Family has decided on placing PEG and this was done by IVR on 01/01/13. Therapy evaluations done and patient limited by balance deficits as well as diplopia, generalized weakness and aphasia. He is able to follow basic commands and express basic wants and needs. He attempts to  communicate via gestures and writing. Remains nonverbal maintaining open mouth posture almost consistently. Rehab was asked by the acute service to evaluate this patient for inpatient rehab admission. He was felt to be appropriate candidate and was admitted today. Pt transferred to CIR 01/02/13 and  presents with severe apraxia and a mild-moderate aphasia impacting all four modalities of language and utilizes written expression for functional communication. Pt also presents with a severe dysphagia and is currently NPO. Pt's functional problem solving and memory appeared intact for very basic tasks assessed. Pt would benefit from skilled SLP intervention to maximize functional communication and increased oral-motor ROM and strength for eventual PO trials to maximize swallow function.     SLP Assessment  Patient will need skilled Speech Lanaguage Pathology Services during CIR admission    Recommendations  Diet Recommendations: NPO Medication Administration: Via alternative means Oral Care Recommendations: Oral care QID Patient destination:  (TBD) Follow up Recommendations: 24 hour supervision/assistance (TBD) Equipment Recommended: None recommended by SLP    SLP Frequency 5 out of 7 days   SLP Treatment/Interventions Cognitive remediation/compensation;Cueing hierarchy;Dysphagia/aspiration precaution training;Functional tasks;Internal/external aids;Environmental controls;Neuromuscular electrical stimulation;Patient/family education;Therapeutic Activities;Speech/Language facilitation;Therapeutic Exercise;Oral motor exercises    Pain Reported "pressure and burning" behind his eyes, RN made aware  Short Term Goals: Week 1: SLP Short Term Goal 1 (Week 1): Pt will demonstrate emergent awareness and will self-monitor secretions with Mod A question cues.  SLP Short Term Goal 2 (Week 1): Pt will demonstrate increased AP transit with ice chip trials with Max A multimodal cueing SLP Short Term Goal 3 (Week 1): Pt will perform lingual and labial ROM exercises with Max A mutlimodal cueing SLP Short Term Goal 4 (Week 1): Pt will self-monitor and correct spelling errors with Mod A question cues.  SLP Short Term Goal 5 (Week 1): Pt will demonstrate functional problem solving with supervision cues for  mildly complex tasks  See FIM for current functional status Refer to Care Plan for Long Term Goals  Recommendations for other services: None  Discharge Criteria: Patient will be discharged from SLP if patient refuses treatment 3 consecutive times without medical reason, if treatment goals not met, if there is a change in medical status, if patient makes no progress towards goals or if patient is discharged from hospital.  The above assessment, treatment plan, treatment alternatives and goals were discussed and mutually agreed upon: by patient  Damiah Mcdonald 01/03/2013, 4:31 PM

## 2013-01-03 NOTE — Care Management Note (Signed)
    Page 1 of 1   01/03/2013     8:20:08 AM   CARE MANAGEMENT NOTE 01/03/2013  Patient:  Johnathan Bowman, Johnathan Bowman   Account Number:  [id number]  Date Initiated:  12/20/2012  Documentation initiated by:  Ronny Flurry  Subjective/Objective Assessment:   Health care associated pneumonia     Action/Plan:   PT/OT evals- recommended inpatient rehab   Anticipated DC Date:  01/02/2013   Anticipated DC Plan:  IP REHAB FACILITY      DC Planning Services  CM consult      PAC Choice  IP REHAB   Choice offered to / List presented to:             Status of service:  Completed, signed off Medicare Important Message given?   (If response is "NO", the following Medicare IM given date fields will be blank) Date Medicare IM given:   Date Additional Medicare IM given:    Discharge Disposition:  IP REHAB FACILITY  Per UR Regulation:  Reviewed for med. necessity/level of care/duration of stay  If discussed at Long Length of Stay Meetings, dates discussed:   01/01/2013    Comments:  01/01/13 PEG placed 01/02/13 discharged to inpatient rehab

## 2013-01-03 NOTE — Progress Notes (Signed)
INITIAL NUTRITION ASSESSMENT  DOCUMENTATION CODES Per approved criteria  -Not Applicable   INTERVENTION: Monitor bolus TF transition. -Jevity 1.2 350 ml 5 x daily= goal -Free water 200 ml 4 x daily D/C prostat  NUTRITION DIAGNOSIS: Inadequate oral intake related to dysphagia as evidenced by NPO status.   Goal: Tolerate TF to meet >/=90% estimated needs.  Monitor:  TF tolerance and adequacy, labs, weights, diet advancement  Reason for Assessment: Consult for TF management to begin bolus TF  79 y.o. male  Admitting Dx: CVA (cerebral infarction)  ASSESSMENT:  Patient admitted to inpt rehab 1/15. Patient with stage IV lung cancer on home O2.  S/p MCA and PCA infarcts with aphagia (difficulty deciphering handwritten notes to communicate), dysphagia (unable to manage secretions and failed swallow eval).  S/P PEG placement 1/14.  Patient has been receiving  Continuous TF throughout stay.  Weight loss of 12 lbs in the past 2 1/2 weeks.  TF:  Jevity 1.2 at 60 ml/hr tolerated well and just changed to bolus.  New TF orders:  Jevity 1.2 bolus via PEG 350 ml 5 x daily (increase by 50 ml evert feeding to goal of 350 ml per feed) Free Water:  200 ml every 6 hours (4 x per day Prostat:  30 ml daily per PEG Above at goal to provide:  2200 kcal, 112 gm protein, 2218 ml free water daily Based on above, estimated needs met without prostat  Height: Ht Readings from Last 1 Encounters:  01/02/13 5\' 5"  (1.651 m)    Weight: Wt Readings from Last 1 Encounters:  01/03/13 173 lb 8 oz (78.7 kg)    Ideal Body Weight: 136 lbs  % Ideal Body Weight: 126  Wt Readings from Last 10 Encounters:  01/03/13 173 lb 8 oz (78.7 kg)  12/27/12 169 lb 5 oz (76.8 kg)  10/24/12 185 lb 1.6 oz (83.961 kg)  10/03/12 186 lb 6.4 oz (84.55 kg)  09/10/12 188 lb (85.276 kg)  08/22/12 188 lb 12.8 oz (85.639 kg)  08/15/12 186 lb 1.6 oz (84.414 kg)  08/01/12 188 lb 12.8 oz (85.639 kg)  07/11/12 189 lb 3.2 oz  (85.821 kg)  07/05/12 188 lb (85.276 kg)    Usual Body Weight: 185 lbs  % Usual Body Weight: 94  BMI:  Body mass index is 28.87 kg/(m^2).-overweight  Estimated Nutritional Needs: Kcal: 1900-2100 Protein: 95-105 Fluid: >1.9 L daily  Skin: intact  Diet Order: NPO  EDUCATION NEEDS: -No education needs identified at this time   Intake/Output Summary (Last 24 hours) at 01/03/13 1032 Last data filed at 01/03/13 0555  Gross per 24 hour  Intake      0 ml  Output    400 ml  Net   -400 ml    Last BM: 1/15  Labs:   Lab 01/03/13 0531 01/02/13 0500 01/01/13 0547  NA 138 136 142  K 4.0 4.0 3.9  CL 103 104 110  CO2 24 22 23   BUN 13 7 10   CREATININE 0.96 0.95 0.98  CALCIUM 9.2 9.4 9.5  MG -- -- --  PHOS -- -- --  GLUCOSE 202* 136* 77    CBG (last 3)   Basename 01/03/13 0720 01/03/13 0524 01/02/13 2141  GLUCAP 178* 207* 105*    Scheduled Meds:    . antiseptic oral rinse  15 mL Mouth Rinse QID  . aspirin  325 mg Per Tube Daily  . chlorhexidine  15 mL Mouth Rinse BID  . enoxaparin  40  mg Subcutaneous Q24H  . feeding supplement (JEVITY 1.2 CAL)  350 mL Per Tube 5 X Daily  . feeding supplement  30 mL Per Tube Daily  . free water  200 mL Per Tube Q6H  . insulin aspart  0-15 Units Subcutaneous Q6H  . insulin glargine  10 Units Subcutaneous QHS  . pantoprazole sodium  40 mg Per Tube Daily    Continuous Infusions:   Past Medical History  Diagnosis Date  . Hypertension   . Diabetes mellitus   . Dyslipidemia   . Mini stroke 2005  . Degenerative disc disease   . GERD (gastroesophageal reflux disease)   . Osteopenia 2005  . Coronary artery disease 2005    treated medically  . BPH (benign prostatic hyperplasia)   . DVT (deep venous thrombosis)   . History of chemotherapy   . Diverticulosis     per scan 05/28/12  . Calculus of kidney     per scan 05/28/12  . Hx of radiation therapy 06/14/12 -07/05/12    left chest  . lung ca 11/12/2010    LUL    Past  Surgical History  Procedure Date  . No past surgeries   . Pleurex cath 02/08/11    left-sided pleural effusion    Oran Rein, RD, LDN Clinical Inpatient Dietitian Pager:  [phone] Weekend and after hours pager:  [phone]

## 2013-01-03 NOTE — Evaluation (Signed)
Occupational Therapy Assessment and Plan  Patient Details  Name: Johnathan Bowman MRN: [id number] Date of Birth: December 26, 1928  OT Diagnosis: muscle weakness (generalized) Rehab Potential: Rehab Potential: Good ELOS:   1 week  Today's Date: 01/03/2013 Time: 10:30-1130 (55 min)  Problem List:  Patient Active Problem List  Diagnosis  . PLEURAL EFFUSION, LEFT  . PULMONARY NODULE, LEFT UPPER LOBE  . ADENOCARCIMONA, LUNG, NONSMALL CELL  . DVT of leg (deep venous thrombosis)  . DVT (deep venous thrombosis)  . Cancer  . Hx of radiation therapy  . CAP (community acquired pneumonia)  . Hypoxia  . Hypercoagulopathy  . UTI (lower urinary tract infection)  . Hematuria  . Lower extremity edema  . Hypotension  . Altered mental status  . Facial weakness  . Mutism  . Lung cancer  . Acute respiratory failure  . COPD (chronic obstructive pulmonary disease)  . CVA (cerebral infarction)  . Aphasia  . Dysphagia  . Diabetes mellitus  . GERD (gastroesophageal reflux disease)    Past Medical History:  Past Medical History  Diagnosis Date  . Hypertension   . Diabetes mellitus   . Dyslipidemia   . Mini stroke 2005  . Degenerative disc disease   . GERD (gastroesophageal reflux disease)   . Osteopenia 2005  . Coronary artery disease 2005    treated medically  . BPH (benign prostatic hyperplasia)   . DVT (deep venous thrombosis)   . History of chemotherapy   . Diverticulosis     per scan 05/28/12  . Calculus of kidney     per scan 05/28/12  . Hx of radiation therapy 06/14/12 -07/05/12    left chest  . lung ca 11/12/2010    LUL   Past Surgical History:  Past Surgical History  Procedure Date  . No past surgeries   . Pleurex cath 02/08/11    left-sided pleural effusion    Assessment & Plan Clinical Impression: Patient is a 79 y.o. year old male with history of DM, stage IV metastatic lung cancer with history of malignant pleural effusion requiring Pleurex cath in the past; admitted  on 12/15/12 with AMS changes with progressive SOB and chills. He required intubation in ED due to hypoxia and metabolic acidosis. He was started on antibiotics for HCAP. CT head negative. CXR with loculated malignant left effusion--being monitored as patient with improvement. Patient with mutism and facial weakness. Neurology consulted and MRI of brain done showing Suspicion of acute/subacute infarct in right internal capsule-tumor not entirely excluded. Repeat MRI in 7 days recommended. EEG with mild slowing and question of metabolic encephalopathy. Goals of care discussed with family and no PEG/no trach decided initially. Extubated on 01/08. BSS done and NPO recommended. Patient unable to tolerate NGT but indicating hunger. He continues to have difficulty handling oral secretions needing frequent suctioning. Repeat MRI brain on 01/11 reveals recurrent acute on chronic ischemia most pronounced in left MCA and PCA territories with slight increase in petechial hemorrhage associated with left temporal operculum lesion.  Family has decided on placing PEG and this was done by IVR on 01/01/13. Therapy evaluations done and patient limited by balance deficits as well as diplopia, generalized weakness and aphasia. He is able to follow basic commands and express basic wants and needs. He attempts to communicate via gestures and writing. Remains nonverbal maintaining open mouth posture almost consistently.  Patient transferred to CIR on 01/02/2013 .    Patient currently requires min to mod A with basic self-care skills and  basic mobility (standing and transfers) secondary to muscle weakness, decreased cardiorespiratoy endurance and decreased oxygen support, decreased coordination and decreased FMC, decreased visual perceptual skills, decreased visual motor skills and diplopia and decreased standing balance and decreased balance strategies.  Prior to hospitalization, patient could complete ADL with independent .  Patient  will benefit from skilled intervention to decrease level of assist with basic self-care skills and increase independence with basic self-care skills prior to discharge home with care partner.  Anticipate patient will require 24 hour supervision and follow up home health.  OT - End of Session Activity Tolerance: Tolerates 10 - 20 min activity with multiple rests OT Assessment Rehab Potential: Good Barriers to Discharge: Decreased caregiver support OT Plan OT Intensity: Minimum of 1-2 x/day, 45 to 90 minutes OT Frequency: 5 out of 7 days OT Treatment/Interventions: Balance/vestibular training;Disease mangement/prevention;Functional mobility training;Self Care/advanced ADL retraining;Therapeutic Exercise;UE/LE Strength taining/ROM;Skin care/wound managment;Neuromuscular re-education;DME/adaptive equipment instruction;Community reintegration;Cognitive remediation/compensation;Discharge planning;Patient/family education;Psychosocial support;Therapeutic Activities;Pain management;UE/LE Coordination activities;Visual/perceptual remediation/compensation OT Recommendation Follow Up Recommendations: Home health OT  OT Evaluation Precautions/Restrictions  Precautions Precautions: Fall Precaution Comments: NPO, PEG tube Restrictions Weight Bearing Restrictions: No General Chart Reviewed: Yes Family/Caregiver Present: No    Pain  no c/o pain Home Living/Prior Functioning Home Living Lives With: Spouse;Son Type of Home: House Home Layout: Two level Bathroom Shower/Tub: Nurse, adult Accessibility: Yes ADL  see FIM Vision/Perception  Vision - History Baseline Vision: Wears glasses all the time Visual History: Glaucoma Patient Visual Report: Diplopia;Blurring of vision;Unable to keep objects in focus Vision - Assessment Eye Alignment: Impaired (comment) Ocular Range of Motion: Restricted looking up Tracking/Visual Pursuits: Decreased smoothness of horizontal tracking;Decreased  smoothness of vertical tracking;Requires cues, head turns, or add eye shifts to track;Decreased smoothness of eye movement to RIGHT superior field;Decreased smoothness of eye movement to LEFT superior field Saccades: Additional eye shifts occurred during testing;Additional head turns occurred during testing Convergence: Impaired (comment) Diplopia Assessment: Only with right gaze Perception Perception: Impaired Spatial Orientation: to be further assessed. appears due to visual deficits  Praxis Praxis: Intact  Cognition Arousal/Alertness: Awake/alert Orientation Level: Oriented to person;Oriented to place Attention: Sustained Focused Attention: Appears intact Sustained Attention: Appears intact Awareness: Impaired Awareness Impairment: Anticipatory impairment Sensation Sensation Light Touch: Appears Intact Coordination Gross Motor Movements are Fluid and Coordinated: Yes Fine Motor Movements are Fluid and Coordinated: No Motor  Motor Motor - Skilled Clinical Observations: generali=ed weakness Mobility  Transfers Sit to Stand: 4: Min assist Stand to Sit: 4: Min assist  Trunk/Postural Assessment  Cervical Assessment Cervical Assessment: Within Functional Limits Thoracic Assessment Thoracic Assessment: Within Functional Limits  Balance  overall min A without  UE support in transfers Extremity/Trunk Assessment RUE Assessment RUE Assessment: Exceptions to WFL (4/5) LUE Assessment LUE Assessment: Within Functional Limits  See FIM for current functional status Refer to Care Plan for Long Term Goals  Recommendations for other services: None  Discharge Criteria: Patient will be discharged from OT if patient refuses treatment 3 consecutive times without medical reason, if treatment goals not met, if there is a change in medical status, if patient makes no progress towards goals or if patient is discharged from hospital.  The above assessment, treatment plan, treatment  alternatives and goals were discussed and mutually agreed upon: by patient  OT eval initiated with OT role, purpose and goals discussed. Pt in chair finishing up with SLP therapy when arrived. Self care retraining at sink level with focus on toilet transfer (stand pivot), toileting in standing, sit to  stands, standing balance with and without UE support, activity tolerance. Pt used white board to write for communication. Pt did misspell most of the word but able to to get his want or need across. Pt able to follow 1 step directions with extra time. Pt with diplopia noted in the right field .  Roney Mans [facility] 01/03/2013, 12:04 PM

## 2013-01-03 NOTE — Progress Notes (Signed)
Subjective/Complaints: Has cough, slept fairly well. No fevers. A 12 point review of systems has been performed and if not noted above is otherwise negative.   Objective: Vital Signs: Blood pressure 145/87, pulse 106, temperature 98.5 F (36.9 C), temperature source Oral, resp. rate 20, height 5\' 5"  (1.651 m), weight 78.7 kg (173 lb 8 oz), SpO2 94.00%. Ir Gastrostomy Tube Mod Sed  01/01/2013  *RADIOLOGY REPORT*  Clinical Data:  History of cerebral infarction.  The patient requires a gastrostomy tube due to inability to safely ingest food orally.  PERCUTANEOUS GASTROSTOMY TUBE PLACEMENT  Sedation:  1.0 mg IV Versed; 25 mcg IV Fentanyl.  Total Moderate Sedation Time: 9 minutes.  Contrast:  20 ml Omnipaque-300  Additional Medications:  1 gram IV vancomycin was administered prior to the procedure. Vancomycin was given within two hours of incision.  Vancomycin was given due to an antibiotic allergy.  Fluoroscopy Time: 4.2 minutes.  Procedure:  The procedure, risks, benefits, and alternatives were explained to the patient.  Questions regarding the procedure were encouraged and answered.  The patient understands and consents to the procedure.  A 5-French catheter was advanced through the patient's mouth under fluoroscopy into the esophagus and to the level of the stomach. This catheter was used to insufflate the stomach with air under fluoroscopy.  The abdominal wall was prepped with Betadine in a sterile fashion, and a sterile drape was applied covering the operative field.  A sterile gown and sterile gloves were used for the procedure. Local anesthesia was provided with 1% Lidocaine.  A skin incision was made in the upper abdominal wall.  Under fluoroscopy, an 18 gauge trocar needle was advanced into the stomach.  Contrast injection was performed to confirm intraluminal position of the needle tip.  A single T tack was then deployed in the lumen of the stomach.  This was brought up to tension at the skin  surface.  Over a guidewire, a 9-French sheath was advanced into the lumen of the stomach.  The wire was left in place as a safety wire.  A loop snare device from a percutaneous gastrostomy kit was then advanced into the stomach.  A floppy guide wire was advanced through the orogastric catheter under fluoroscopy in the stomach.  The loop snare advanced through the percutaneous gastric access was used to snare the guide wire. This allowed withdrawal of the loop snare out of the patient's mouth by retraction of the orogastric catheter and wire.  A 20-French bumper retention gastrostomy tube was looped around the snare device.  It was then pulled back through the patient's mouth. The retention bumper was brought up to the anterior gastric wall. The T tack suture was cut at the skin.  The exiting gastrostomy tube was cut to appropriate length and a feeding adapter applied. The catheter was injected with contrast material to confirm position and a fluoroscopic spot image saved.  The tube was then flushed with saline.  A dressing was applied over the gastrostomy exit site.  Complications:  None  Findings:  Initial fluoroscopy demonstrates adequate opacification of the colon by ingested barium in order to prevent colonic injury during the procedure.  The stomach distended well with air allowing safe placement of the gastrostomy tube.  After placement, the tip of the gastrostomy tube lies in the body of the stomach.  IMPRESSION: Percutaneous gastrostomy with placement of a 20-French bumper retention tube in the body of the stomach.  This tube can be used for percutaneous feeds beginning  in 24 hours after placement.   Original Report Authenticated By: Irish Lack, M.D.     Basename 01/03/13 0531  WBC 5.3  HGB 10.2*  HCT 33.0*  PLT 332    Basename 01/03/13 0531 01/02/13 0500  NA 138 136  K 4.0 4.0  CL 103 104  GLUCOSE 202* 136*  BUN 13 7  CREATININE 0.96 0.95  CALCIUM 9.2 9.4   CBG (last 3)   Basename  01/03/13 0524 01/02/13 2141 01/02/13 1650  GLUCAP 207* 105* 175*    Wt Readings from Last 3 Encounters:  01/03/13 78.7 kg (173 lb 8 oz)  12/27/12 76.8 kg (169 lb 5 oz)  10/24/12 83.961 kg (185 lb 1.6 oz)    Physical Exam:  Constitutional: He appears well-developed and well-nourished.  Fatigued appearing male. Wet, congested sounds with drooling. Difficulty deciphering handwriting attempts at communication.  HENT:  Head: Normocephalic and atraumatic.  Eyes: Pupils are equal, round, and reactive to light.  Neck: Normal range of motion.  Cardiovascular: Normal rate and regular rhythm. No murmurs auscultated  Pulmonary/Chest: Effort normal. He has rhonchi.  Audible wet sounds due to pocketing of saliva with coarse sounds throughout. No focal rales auscultated.  Abdominal: Soft. Bowel sounds are normal. There is tenderness (around PEG site.). There is no rebound and no guarding.  Musculoskeletal: He exhibits no edema and no tenderness.  Neurological: He is alert.  Able to say "ah-hah" with cues otherwise speech wet and garbled. Weak cough.head nods to y/n questions. evere right facial weakness and poor oropharyngeal control. Can minimally protrude tongue.  Weak cough. Follows basic commands without difficulty and moves all four but displays pronator drift on the RUE and decreased FMC as a whole on the right side. Strength 4/5 right upper and lower and 4+ on the left. He does withdraw to pain on the both sides Skin: Skin is warm and dry.    Assessment/Plan: 1. Functional deficits secondary to thrombotic left MCA and PCA infarcts which require 3+ hours per day of interdisciplinary therapy in a comprehensive inpatient rehab setting. Physiatrist is providing close team supervision and 24 hour management of active medical problems listed below. Physiatrist and rehab team continue to assess barriers to discharge/monitor patient progress toward functional and medical goals. FIM:                                    Medical Problem List and Plan:  1. DVT Prophylaxis/Anticoagulation: Pharmaceutical: Lovenox  2. ? Chronic pain: was on MS contin at home and indicating back pain. Will start MSIR prn for pain Management.  3. Mood: Mild anxiety reported. Will monitor for now. Limited due to aphasia and inability to write.  4. Neuropsych: This patient is not capable of making decisions on his/her own behalf.  5. DM type 2: TF initiated. Follow cbg's and adjust insulin appropriately. ssi coverage also. 6. Stage IV Lung cancer: Use oxygen at home per wife. Will resume. Use nebs as needed.  7. HTN: check BP on bid basis. Continue to hold Lotrel for now.  8. Dysphagia:  -continue CONTINUOUS TF  -major aspiration risk--HOB at 30 degrees or greater  -follow closely for temps, labs, etc  LOS (Days) 1 A FACE TO FACE EVALUATION WAS PERFORMED  Connery Shiffler T 01/03/2013 7:04 AM

## 2013-01-03 NOTE — Progress Notes (Signed)
Patient information reviewed and entered into eRehab system by Tenelle Andreason, RN, CRRN, PPS Coordinator.  Information including medical coding and functional independence measure will be reviewed and updated through discharge.     Per nursing patient was given "Data Collection Information Summary for Patients in Inpatient Rehabilitation Facilities with attached "Privacy Act Statement-Health Care Records" upon admission.  

## 2013-01-03 NOTE — Evaluation (Signed)
Physical Therapy Assessment and Plan  Patient Details  Name: Johnathan Bowman MRN: [id number] Date of Birth: 10-20-1929  PT Diagnosis: Difficulty walking, Impaired cognition and Muscle weakness Rehab Potential: Good ELOS: 7-10 days   Today's Date: 01/03/2013 Time: 800-900  Time Calculation (min): 60 min  Problem List:  Patient Active Problem List  Diagnosis  . PLEURAL EFFUSION, LEFT  . PULMONARY NODULE, LEFT UPPER LOBE  . ADENOCARCIMONA, LUNG, NONSMALL CELL  . DVT of leg (deep venous thrombosis)  . DVT (deep venous thrombosis)  . Cancer  . Hx of radiation therapy  . CAP (community acquired pneumonia)  . Hypoxia  . Hypercoagulopathy  . UTI (lower urinary tract infection)  . Hematuria  . Lower extremity edema  . Hypotension  . Altered mental status  . Facial weakness  . Mutism  . Lung cancer  . Acute respiratory failure  . COPD (chronic obstructive pulmonary disease)  . CVA (cerebral infarction)  . Aphasia  . Dysphagia  . Diabetes mellitus  . GERD (gastroesophageal reflux disease)    Past Medical History:  Past Medical History  Diagnosis Date  . Hypertension   . Diabetes mellitus   . Dyslipidemia   . Mini stroke 2005  . Degenerative disc disease   . GERD (gastroesophageal reflux disease)   . Osteopenia 2005  . Coronary artery disease 2005    treated medically  . BPH (benign prostatic hyperplasia)   . DVT (deep venous thrombosis)   . History of chemotherapy   . Diverticulosis     per scan 05/28/12  . Calculus of kidney     per scan 05/28/12  . Hx of radiation therapy 06/14/12 -07/05/12    left chest  . lung ca 11/12/2010    LUL   Past Surgical History:  Past Surgical History  Procedure Date  . No past surgeries   . Pleurex cath 02/08/11    left-sided pleural effusion    Assessment & Plan Clinical Impression: Patient is a 79 y.o. year old male with recent admission to the hospital on 12/15/12 with AMS changes with progressive SOB and chills. He required  intubation in ED due to hypoxia and metabolic acidosis. He was started on antibiotics for HCAP. CT head negative. CXR with loculated malignant left effusion--being monitored as patient with improvement. Patient with mutism and facial weakness. Neurology consulted and MRI of brain done showing Suspicion of acute/subacute infarct in right internal capsule-tumor not entirely excluded. Repeat MRI in 7 days recommended. EEG with mild slowing and question of metabolic encephalopathy. Goals of care discussed with family and no PEG/no trach decided initially. Extubated on 01/08. BSS done and NPO recommended. Patient unable to tolerate NGT but indicating hunger. He continues to have difficulty handling oral secretions needing frequent suctioning. Repeat MRI brain on 01/11 reveals recurrent acute on chronic ischemia most pronounced in left MCA and PCA territories with slight increase in petechial hemorrhage associated with left temporal operculum lesion.  Family has decided on placing PEG and this was done by IVR on 01/01/13.  Patient transferred to CIR on 01/02/2013 .   Patient currently requires mod with mobility secondary to muscle weakness, decreased oxygen support, decreased awareness and decreased standing balance and decreased balance strategies.  Prior to hospitalization, patient was modified independent  with mobility and lived with Spouse in a House home.  Home access is  Stairs to enter.  Patient will benefit from skilled PT intervention to maximize safe functional mobility, minimize fall risk and decrease caregiver burden  for planned discharge home with 24 hour supervision.  Anticipate patient will benefit from follow up HH at discharge.  PT - End of Session Activity Tolerance: Tolerates 30+ min activity with multiple rests Endurance Deficit: Yes PT Assessment Rehab Potential: Good Barriers to Discharge: Decreased caregiver support PT Plan PT Intensity: Minimum of 1-2 x/day ,45 to 90 minutes PT  Frequency: 5 out of 7 days PT Duration Estimated Length of Stay: 7-10 days PT Treatment/Interventions: Ambulation/gait training;Community reintegration;DME/adaptive equipment instruction;Neuromuscular re-education;Therapeutic Exercise;UE/LE Strength taining/ROM;Splinting/orthotics;Pain management;Discharge planning;Balance/vestibular training;Cognitive remediation/compensation;Functional mobility training;Therapeutic Activities;UE/LE Coordination activities;Wheelchair propulsion/positioning;Stair training;Patient/family education PT Recommendation Follow Up Recommendations: Home health PT Patient destination: Home Equipment Recommended: Rolling walker with 5" wheels  PT Evaluation Precautions/Restrictions Precautions Precautions: Fall Restrictions Weight Bearing Restrictions: No General   Vital SignsOxygen Therapy SpO2: 94 % (with mobility) O2 Device: Nasal cannula O2 Flow Rate (L/min): 2 L/min Pain Pain Assessment Pain Assessment: No/denies pain Home Living/Prior Functioning Home Living Lives With: Spouse Available Help at Discharge: Family Type of Home: House Home Access: Stairs to enter Home Layout: Two level Prior Function Level of Independence: Independent with transfers;Independent with basic ADLs Able to Take Stairs?: Yes  Cognition Overall Cognitive Status: Impaired Arousal/Alertness: Awake/alert Orientation Level: Oriented to person;Oriented to place;Oriented to situation Attention: Sustained;Selective Focused Attention: Appears intact Sustained Attention: Appears intact Selective Attention: Appears intact Memory: Appears intact Awareness: Impaired Awareness Impairment: Emergent impairment Problem Solving: Appears intact Safety/Judgment: Appears intact Comments: Pt demonstrated problem solving for basic and functional tasks and demonstrated recall of biographical information and  orientation Sensation Sensation Light Touch: Appears Intact Proprioception:  Appears Intact Coordination Gross Motor Movements are Fluid and Coordinated: Yes Motor  Motor Motor - Skilled Clinical Observations: generalized weakness  Mobility Bed Mobility Supine to Sit: 4: Min assist Transfers Sit to Stand: 3: Mod assist Stand to Sit: 3: Mod assist Stand Pivot Transfers: 3: Mod assist Stand Pivot Transfer Details (indicate cue type and reason): steadying assist, manual faciliation for wt shifts, cues for safety Locomotion  Ambulation Ambulation: Yes Ambulation/Gait Assistance: 3: Mod assist Ambulation Distance (Feet): 15 Feet Assistive device: None Ambulation/Gait Assistance Details: steadying assist, manual faciliation for wt shifts, cues for safety Stairs / Additional Locomotion Stairs: Yes Stairs Assistance: 3: Mod assist Stair Management Technique: Two rails Number of Stairs: 3  Wheelchair Mobility Wheelchair Mobility: Yes Wheelchair Assistance: 4: Min Education officer, museum: Both upper extremities Wheelchair Parts Management: Needs assistance Distance: 40'  Trunk/Postural Assessment  Cervical Assessment Cervical Assessment: Within Functional Limits Thoracic Assessment Thoracic Assessment: Within Functional Limits Lumbar Assessment Lumbar Assessment: Within Functional Limits Postural Control Postural Control: Deficits on evaluation Righting Reactions: delayed  Balance Static Sitting Balance Static Sitting - Level of Assistance: 5: Stand by assistance Static Standing Balance Static Standing - Balance Support: During functional activity Static Standing - Level of Assistance: 3: Mod assist Extremity Assessment      RLE Assessment RLE Assessment: Within Functional Limits LLE Assessment LLE Assessment: Within Functional Limits  FIM:  FIM - Locomotion: Wheelchair Distance: 40' FIM - Locomotion: Ambulation Ambulation/Gait Assistance: 3: Mod assist   Refer to Care Plan for Long Term Goals  Recommendations for other services:  None  Discharge Criteria: Patient will be discharged from PT if patient refuses treatment 3 consecutive times without medical reason, if treatment goals not met, if there is a change in medical status, if patient makes no progress towards goals or if patient is discharged from hospital.  The above assessment, treatment plan, treatment alternatives and goals were discussed  and mutually agreed upon: by patient  Treatment initiated during session: Gait training with RW 2 x 40' with min steadying assist, min A for RW control and safety.  Pt with spO2 90-94% after gait.  Stair training multiple attempts for LE strengthening and endurance with min A to ascend, mod A to descend stairs with cues for safety.  Pt requires frequent rests but is motivated to improve.  Pt able to initiate communication through writing and able to follow 2-3 step verbal commands during session.  Avionna Bower 01/03/2013, 4:39 PM

## 2013-01-04 ENCOUNTER — Encounter (HOSPITAL_COMMUNITY): Payer: Medicare Other | Admitting: Occupational Therapy

## 2013-01-04 ENCOUNTER — Inpatient Hospital Stay (HOSPITAL_COMMUNITY): Payer: Medicare Other | Admitting: Speech Pathology

## 2013-01-04 ENCOUNTER — Inpatient Hospital Stay (HOSPITAL_COMMUNITY): Payer: Medicare Other | Admitting: Occupational Therapy

## 2013-01-04 ENCOUNTER — Inpatient Hospital Stay (HOSPITAL_COMMUNITY): Payer: Medicare Other | Admitting: Physical Therapy

## 2013-01-04 DIAGNOSIS — I69991 Dysphagia following unspecified cerebrovascular disease: Secondary | ICD-10-CM

## 2013-01-04 DIAGNOSIS — E1165 Type 2 diabetes mellitus with hyperglycemia: Secondary | ICD-10-CM

## 2013-01-04 DIAGNOSIS — I6330 Cerebral infarction due to thrombosis of unspecified cerebral artery: Secondary | ICD-10-CM

## 2013-01-04 DIAGNOSIS — C3490 Malignant neoplasm of unspecified part of unspecified bronchus or lung: Secondary | ICD-10-CM

## 2013-01-04 DIAGNOSIS — I10 Essential (primary) hypertension: Secondary | ICD-10-CM

## 2013-01-04 LAB — GLUCOSE, CAPILLARY: Glucose-Capillary: 193 mg/dL — ABNORMAL HIGH (ref 70–99)

## 2013-01-04 MED ORDER — MORPHINE SULFATE 10 MG/5ML PO SOLN
10.0000 mg | Freq: Three times a day (TID) | ORAL | Status: DC
  Administered 2013-01-05: 10 mg
  Filled 2013-01-04 (×2): qty 5

## 2013-01-04 MED ORDER — MORPHINE SULFATE 10 MG/5ML PO SOLN
5.0000 mg | ORAL | Status: DC | PRN
  Administered 2013-01-04 – 2013-01-05 (×2): 5 mg
  Filled 2013-01-04 (×2): qty 5

## 2013-01-04 NOTE — Progress Notes (Signed)
Occupational Therapy Session Note  Patient Details  Name: BIRD SWETZ MRN: [id number] Date of Birth: May 25, 1929  Today's Date: 01/04/2013 Time: 1100-1200 Time Calculation (min): 60 min   Skilled Therapeutic Interventions/Progress Updates:    1:1 self care retraining including toilet transfers, functional ambulation around room with RW, bathing and dressing at sink level. Pt needed A with putting arm through sleeves today requiring A and A with socks due to abdominal discomfort. Pt with increased automatic closure of mouth for management of salvia 25% of the time. Pt did cough up tube feed in session after a tube feeding from RN- she was made aware. Pt made more gestures for communication today and was affective with writing on white board needs and wants with extensive amount of extra time. Tearful at end of session discussing how sick his wife is.  Therapy Documentation Precautions:  Precautions Precautions: Fall Precaution Comments: NPO, PEG tube Restrictions Weight Bearing Restrictions: No Pain:  c/o pain in abdomen - RN aware and meds given  See FIM for current functional status  Therapy/Group: Individual Therapy  Roney Mans [facility] 01/04/2013, 12:14 PM

## 2013-01-04 NOTE — Progress Notes (Signed)
Pt with H/O left sided back, rib area and flank pain. Was on MS IR and Contin at home according to [facility].  Tylenol ineffective this AM. Pt grimacing with movement.. Indicates pain ( writes on dry erase board); Pain med scheduled as pt does not consistently report. Carlean Purl, RN

## 2013-01-04 NOTE — Progress Notes (Signed)
Subjective/Complaints: Pt acknowledges coughing, some abdominal pain (via Yes/no questioning)  A 12 point review of systems has been performed and if not noted above is otherwise negative.   Objective: Vital Signs: Blood pressure 115/69, pulse 106, temperature 98 F (36.7 C), temperature source Oral, resp. rate 20, height 5\' 5"  (1.651 m), weight 77.7 kg (171 lb 4.8 oz), SpO2 93.00%. No results found.  Basename 01/03/13 0531  WBC 5.3  HGB 10.2*  HCT 33.0*  PLT 332    Basename 01/03/13 0531 01/02/13 0500  NA 138 136  K 4.0 4.0  CL 103 104  GLUCOSE 202* 136*  BUN 13 7  CREATININE 0.96 0.95  CALCIUM 9.2 9.4   CBG (last 3)   Basename 01/04/13 0457 01/03/13 2217 01/03/13 1642  GLUCAP 153* 147* 121*    Wt Readings from Last 3 Encounters:  01/04/13 77.7 kg (171 lb 4.8 oz)  12/27/12 76.8 kg (169 lb 5 oz)  10/24/12 83.961 kg (185 lb 1.6 oz)    Physical Exam:  Constitutional: He appears well-developed and well-nourished.  Fatigued appearing male.   HENT:  Head: Normocephalic and atraumatic.  Eyes: Pupils are equal, round, and reactive to light.  Neck: Normal range of motion.  Cardiovascular: Normal rate and regular rhythm. No murmurs auscultated  Pulmonary/Chest: Effort normal. He has rhonchi.  Audible wet sounds due to pocketing of saliva with coarse sounds throughout. No focal rales auscultated.  Abdominal: Soft. Bowel sounds are normal. There is tenderness (around PEG site.). There is no rebound and no guarding. He is slightly distended Musculoskeletal: He exhibits no edema and no tenderness.  Neurological: He is alert.   speech wet and garbled. Weak cough.head nods to y/n questions.  right facial weakness and poor oropharyngeal control. Can minimally protrude tongue.  Weak cough. Follows basic commands without difficulty and moves all four but displays pronator drift on the RUE and decreased FMC as a whole on the right side. Strength 4/5 right upper and lower and 4+ on  the left. He does withdraw to pain on the both sides Skin: Skin is warm and dry.    Assessment/Plan: 1. Functional deficits secondary to thrombotic left MCA and PCA infarcts which require 3+ hours per day of interdisciplinary therapy in a comprehensive inpatient rehab setting. Physiatrist is providing close team supervision and 24 hour management of active medical problems listed below. Physiatrist and rehab team continue to assess barriers to discharge/monitor patient progress toward functional and medical goals. FIM: FIM - Bathing Bathing Steps Patient Completed: Chest;Right Arm;Left Arm;Abdomen;Right upper leg;Left upper leg Bathing: 3: Mod-Patient completes 5-7 65f 10 parts or 50-74%  FIM - Upper Body Dressing/Undressing Upper body dressing/undressing: 0: Wears gown/pajamas-no public clothing FIM - Lower Body Dressing/Undressing Lower body dressing/undressing: 0: Wears gown/pajamas-no public clothing  FIM - Toileting Toileting steps completed by patient: Adjust clothing prior to toileting;Performs perineal hygiene;Adjust clothing after toileting Toileting: 4: Assist with fasteners  FIM - Diplomatic Services operational officer Devices: Grab bars Toilet Transfers: 4-To toilet/BSC: Min A (steadying Pt. > 75%);4-From toilet/BSC: Min A (steadying Pt. > 75%)  FIM - Bed/Chair Transfer Bed/Chair Transfer: 4: Chair or W/C > Bed: Min A (steadying Pt. > 75%);4: Bed > Chair or W/C: Min A (steadying Pt. > 75%)  FIM - Locomotion: Wheelchair Distance: 40' Locomotion: Wheelchair: 1: Travels less than 50 ft with minimal assistance (Pt.>75%) FIM - Locomotion: Ambulation Ambulation/Gait Assistance: 3: Mod assist Locomotion: Ambulation: 1: Travels less than 50 ft with moderate assistance (Pt: 50 -  74%)  Comprehension Comprehension Mode: Auditory Comprehension: 4-Understands basic 75 - 89% of the time/requires cueing 10 - 24% of the time  Expression Expression Mode: Verbal Expression:  2-Expresses basic 25 - 49% of the time/requires cueing 50 - 75% of the time. Uses single words/gestures.  Social Interaction Social Interaction: 2-Interacts appropriately 25 - 49% of time - Needs frequent redirection.  Problem Solving Problem Solving: 2-Solves basic 25 - 49% of the time - needs direction more than half the time to initiate, plan or complete simple activities  Memory Memory: 2-Recognizes or recalls 25 - 49% of the time/requires cueing 51 - 75% of the time  Medical Problem List and Plan:  1. DVT Prophylaxis/Anticoagulation: Pharmaceutical: Lovenox  2. ? Chronic pain: was on MS contin at home and indicating back pain. Will start MSIR prn for pain Management.  3. Mood: Mild anxiety reported. Will monitor for now. Limited due to aphasia and inability to write.  4. Neuropsych: This patient is not capable of making decisions on his/her own behalf.  5. DM type 2: TF initiated. Follow cbg's and adjust insulin appropriately. ssi coverage also. 6. Stage IV Lung cancer: Use oxygen at home per wife. Will resume. Use nebs as needed.  7. HTN: check BP on bid basis. Continue to hold Lotrel for now.  8. Dysphagia:  -continue CONTINUOUS TF  -major aspiration risk--HOB at 30 degrees or greater  -follow closely for temps, labs, etc--remains afebrile  -bedside suction  LOS (Days) 2 A FACE TO FACE EVALUATION WAS PERFORMED  SWARTZ,ZACHARY T 01/04/2013 6:59 AM

## 2013-01-04 NOTE — Evaluation (Signed)
Speech Language Pathology Bedside Swallow Evaluation  Patient Details  Name: Johnathan Bowman MRN: [id number] Date of Birth: 04-22-1929  SLP Diagnosis: Cognitive Impairments;Dysphagia;Apraxia;Dysarthria;Aphasia;Voice disorder;Speech and Language deficits  Rehab Potential: Fair ELOS: 1 week   Today's Date: 01/04/2013 Time: 1000-1100 Time Calculation (min): 60 min  Problem List:  Patient Active Problem List  Diagnosis  . PLEURAL EFFUSION, LEFT  . PULMONARY NODULE, LEFT UPPER LOBE  . ADENOCARCIMONA, LUNG, NONSMALL CELL  . DVT of leg (deep venous thrombosis)  . DVT (deep venous thrombosis)  . Cancer  . Hx of radiation therapy  . CAP (community acquired pneumonia)  . Hypoxia  . Hypercoagulopathy  . UTI (lower urinary tract infection)  . Hematuria  . Lower extremity edema  . Hypotension  . Altered mental status  . Facial weakness  . Mutism  . Lung cancer  . Acute respiratory failure  . COPD (chronic obstructive pulmonary disease)  . CVA (cerebral infarction)  . Aphasia  . Dysphagia  . Diabetes mellitus  . GERD (gastroesophageal reflux disease)   Past Medical History:  Past Medical History  Diagnosis Date  . Hypertension   . Diabetes mellitus   . Dyslipidemia   . Mini stroke 2005  . Degenerative disc disease   . GERD (gastroesophageal reflux disease)   . Osteopenia 2005  . Coronary artery disease 2005    treated medically  . BPH (benign prostatic hyperplasia)   . DVT (deep venous thrombosis)   . History of chemotherapy   . Diverticulosis     per scan 05/28/12  . Calculus of kidney     per scan 05/28/12  . Hx of radiation therapy 06/14/12 -07/05/12    left chest  . lung ca 11/12/2010    LUL   Past Surgical History:  Past Surgical History  Procedure Date  . No past surgeries   . Pleurex cath 02/08/11    left-sided pleural effusion    Assessment / Plan / Recommendation Clinical Impression  Pt administered a limited BSE and presents with a severe  oral-pharyngeal dysphagia characterized by a flaccid right side of the face and very limited lingual and labial ROM. Additionally, patient with poor secretion management as evidenced by wet exhalation and intermittent coughing indicative of decreased airway protection with saliva despite diagnostic treatment which included SLP provided max verbal, tactile, and visual cueing for management. Patient not appropriate for pos at this time. Pt would benefit from skilled SLP intervention to maximize management of secretions and assess readiness for PO trials.     SLP Assessment  Patient will need skilled Speech Lanaguage Pathology Services during CIR admission    Recommendations  Diet Recommendations: NPO;Alternative means - long-term Medication Administration: Via alternative means Oral Care Recommendations: Oral care QID Patient destination:  (TBD) Follow up Recommendations: 24 hour supervision/assistance (TBD) Equipment Recommended: None recommended by SLP    SLP Frequency 5 out of 7 days   SLP Treatment/Interventions Cueing hierarchy;Cognitive remediation/compensation;Dysphagia/aspiration precaution training;Functional tasks;Internal/external aids;Environmental controls;Neuromuscular electrical stimulation;Multimodal communication approach;Patient/family education;Therapeutic Activities;Speech/Language facilitation;Oral motor exercises;Therapeutic Exercise    Pain 10/10 pain in abdomen, RN made aware and administered medications  Short Term Goals: Week 1: SLP Short Term Goal 1 (Week 1): Pt will demonstrate emergent awareness and will self-monitor secretions with Mod A question cues.  SLP Short Term Goal 2 (Week 1): Pt will demonstrate increased AP transit with ice chip trials with Max A multimodal cueing SLP Short Term Goal 3 (Week 1): Pt will perform lingual and labial ROM exercises with  Max A mutlimodal cueing SLP Short Term Goal 4 (Week 1): Pt will self-monitor and correct spelling errors with  Mod A question cues.  SLP Short Term Goal 5 (Week 1): Pt will demonstrate functional problem solving with supervision cues for mildly complex tasks  See FIM for current functional status Refer to Care Plan for Long Term Goals  Recommendations for other services: None  Discharge Criteria: Patient will be discharged from SLP if patient refuses treatment 3 consecutive times without medical reason, if treatment goals not met, if there is a change in medical status, if patient makes no progress towards goals or if patient is discharged from hospital.  The above assessment, treatment plan, treatment alternatives and goals were discussed and mutually agreed upon: by patient  Kharma Sampsel 01/04/2013, 3:40 PM

## 2013-01-04 NOTE — Progress Notes (Signed)
[facility] Individual Statement of Services  Patient Name:  Johnathan Bowman  Date:  01/04/2013  Welcome to the [facility].  Our goal is to provide you with an individualized program based on your diagnosis and situation, designed to meet your specific needs.  With this comprehensive rehabilitation program, you will be expected to participate in at least 3 hours of rehabilitation therapies Monday-Friday, with modified therapy programming on the weekends.  Your rehabilitation program will include the following services:  Physical Therapy (PT), Occupational Therapy (OT), Speech Therapy (ST), 24 hour per day rehabilitation nursing, Therapeutic Recreaction (TR), Neuropsychology, Case Management (Social Worker), Rehabilitation Medicine, Nutrition Services and Pharmacy Services  Weekly team conferences will be held on Tuesdays to discuss your progress.  Your  Social Worker will talk with you frequently to get your input and to update you on team discussions.  Team conferences with you and your family in attendance may also be held.  Expected length of stay:  1 week Overall anticipated outcome: supervision  Depending on your progress and recovery, your program may change.  Your  Social Worker will coordinate services and will keep you informed of any changes.  Your  Social Worker's names and contact numbers are listed  below.  The following services may also be recommended but are not provided by the [facility]:   Driving Evaluations  Home Health Rehabiltiation Services  Outpatient Rehabilitatation [facility]  Vocational Rehabilitation   Arrangements will be made to provide these services after discharge if needed.  Arrangements include referral to agencies that provide these services.  Your insurance has been verified to be:  Medicare Your primary doctor is:  Dr. Concepcion Elk  Pertinent information will be shared with your doctor and your  insurance company.   Social Worker:  [place], [place] [phone] or ([phone]  Information discussed with and copy given to patient by: Amada Jupiter, 01/04/2013, 3:57 PM

## 2013-01-04 NOTE — Progress Notes (Signed)
Occupational Therapy Session Note  Patient Details  Name: Johnathan Bowman MRN: [id number] Date of Birth: 01-25-1929  Today's Date: 01/04/2013 Time: 1400-1430 Time Calculation (min): 30 min   Skilled Therapeutic Interventions/Progress Updates:    1:1 focus on dynamic standing balance with and without RW including functional ambulation, catching and throwing a ball in all directions, standing tolerance, navigating through obstacle cones with steady A and picking up items off floor to challenge balance - challenging his balance in prep for ADL tasks. With tactile cues on chin with verbal cue pt able to volitionally close month to decrease drooling with functional ambulation with RW (keeping both hands on RW)   Therapy Documentation Precautions:  Precautions Precautions: Fall Precaution Comments: NPO, PEG tube Restrictions Weight Bearing Restrictions: No Pain:  after session c/o "chest pressure." RN made aware; called for breathing treatment   See FIM for current functional status  Therapy/Group: Individual Therapy  Roney Mans [facility] 01/04/2013, 3:48 PM

## 2013-01-04 NOTE — Progress Notes (Signed)
Physical Therapy Note  Patient Details  Name: Johnathan Bowman MRN: [id number] Date of Birth: [birth year] Today's Date: 01/04/2013  Time: 900-954 54 minutes  Pt c/o rib/abdominal pain, RN made aware.  Gait training controlled environment with RW with min steadying assist, pt easily internally distracted causing LOB requiring min A to correct.  Gait with obstacle negotiation with pt requiring min A for steadying, max visual cuing for maneuvering through obstacles and following verbal and visual cues.  Coordination tapping activity all directions with pt requiring max cuing for sequencing throughout task, min A for balance.  Step up activity for LE strengthening with min A for balance with B UE support, max cuing for coordination of steps.  Sit to stand training without UEs for LE strengthening with supervision-min A.  Pt able to improve gait distance but limited by internal distractions requiring assist for balance when distracted.  Individual therapy   DONAWERTH,KAREN 01/04/2013, 9:52 AM

## 2013-01-04 NOTE — Progress Notes (Signed)
Social Work Patient ID: [employer], male   DOB: 1929-08-07, 79 y.o.   MRN: [id number]  Unable to complete full psychosocial assessment, however, did gather some information from patient's nephew who was visiting.  He will ask wife to plan to meet with me Monday morning to complete assessment and begin process of d/c planning.  Balian Schaller, LCSW

## 2013-01-04 NOTE — Progress Notes (Signed)
Nutrition follow up  Diet:  NPO,  Unable to handle his own secretions.  TF per PEG:  Jevity 1.2- 350 ml 5 x daily plus 200 ml free water 4 x daily.  Providing:  2200 kcals, 112 gm protein, 2218 ml free water daily.  Per RN, patient is tolerating TF well.  BM 1/15.  Did cough a lot with therapy and regurgitated TF smelling substance from strength of cough.  MD note indicates to continue continuous TF.  Discussed with PA.  Plan is to continue bolus TF.  Tolerating TF well.    Continue to monitor.  Oran Rein, RD, LDN Clinical Inpatient Dietitian Pager:  [phone] Weekend and after hours pager:  [phone]

## 2013-01-05 ENCOUNTER — Inpatient Hospital Stay (HOSPITAL_COMMUNITY): Payer: Medicare Other | Admitting: Occupational Therapy

## 2013-01-05 ENCOUNTER — Inpatient Hospital Stay (HOSPITAL_COMMUNITY): Payer: Medicare Other | Admitting: Speech Pathology

## 2013-01-05 ENCOUNTER — Inpatient Hospital Stay (HOSPITAL_COMMUNITY): Payer: Medicare Other | Admitting: Physical Therapy

## 2013-01-05 DIAGNOSIS — J159 Unspecified bacterial pneumonia: Secondary | ICD-10-CM

## 2013-01-05 DIAGNOSIS — I6330 Cerebral infarction due to thrombosis of unspecified cerebral artery: Secondary | ICD-10-CM

## 2013-01-05 LAB — GLUCOSE, CAPILLARY
Glucose-Capillary: 217 mg/dL — ABNORMAL HIGH (ref 70–99)
Glucose-Capillary: 144 mg/dL — ABNORMAL HIGH (ref 70–99)
Glucose-Capillary: 153 mg/dL — ABNORMAL HIGH (ref 70–99)

## 2013-01-05 NOTE — Progress Notes (Signed)
Physical Therapy Session Note  Patient Details  Name: Johnathan Bowman MRN: [id number] Date of Birth: 15-Jul-1929  Today's Date: 01/05/2013 Time: 1000-1030 Time Calculation (min): 30 min  Therapy Documentation Precautions:  Precautions Precautions: Fall Precaution Comments: NPO, PEG tube Restrictions Weight Bearing Restrictions: No Pain: mild in stomach area/PEG tube   Therapeutic Exercise:(15')  Marching-in-place multiple times x 60 seconds with patient becoming only mildly DOE and good recovery in sitting. Therapeutic Activity:(15') Transfer Training sit<->stand multiple times at S/Mod-I.      Patient needing verbal cues for hand placement each time when going from stand to sit, to ensure safety.  See FIM for current functional status  Therapy/Group: Individual Therapy  Tenae Graziosi J 01/05/2013, 10:16 AM

## 2013-01-05 NOTE — Progress Notes (Signed)
Speech Language Pathology Daily Session Note  Patient Details  Name: Johnathan Bowman MRN: [id number] Date of Birth: 1929-08-23  Today's Date: 01/05/2013 Time: 1035-1105 Time Calculation (min): 30 min  Short Term Goals: Week 1: SLP Short Term Goal 1 (Week 1): Pt will demonstrate emergent awareness and will self-monitor secretions with Mod A question cues.  SLP Short Term Goal 2 (Week 1): Pt will demonstrate increased AP transit with ice chip trials with Max A multimodal cueing SLP Short Term Goal 3 (Week 1): Pt will perform lingual and labial ROM exercises with Max A mutlimodal cueing SLP Short Term Goal 4 (Week 1): Pt will self-monitor and correct spelling errors with Mod A question cues.  SLP Short Term Goal 5 (Week 1): Pt will demonstrate functional problem solving with supervision cues for mildly complex tasks  Skilled Therapeutic Interventions: Patient required minimum verbal cues to self-monitor right anterolateral spillage of secretions.  He required maximum verbal and tactile cues to perform lingual and labial ROM exercises as well as maximum verbal cues to push ice chip posteriorly to initiate swallow.     FIM:  Comprehension Comprehension Mode: Auditory Comprehension: 4-Understands basic 75 - 89% of the time/requires cueing 10 - 24% of the time Expression Expression Mode: Nonverbal Expression: 3-Expresses basic 50 - 74% of the time/requires cueing 25 - 50% of the time. Needs to repeat parts of sentences. Social Interaction Social Interaction: 4-Interacts appropriately 75 - 89% of the time - Needs redirection for appropriate language or to initiate interaction. Problem Solving Problem Solving: 4-Solves basic 75 - 89% of the time/requires cueing 10 - 24% of the time Memory Memory: 4-Recognizes or recalls 75 - 89% of the time/requires cueing 10 - 24% of the time FIM - Eating Eating Activity: 1: Helper performs IV, parenteral, or tube feeding  Pain Pain Assessment Pain  Assessment: No/denies pain Pain Score: 0-No pain Pain Location: Flank Pain Orientation: Left Pain Intervention(s): Medication (See eMAR)  Therapy/Group: Individual Therapy  Lenny Pastel 01/05/2013, 3:17 PM

## 2013-01-05 NOTE — Progress Notes (Signed)
Occupational Therapy Session Note  Patient Details  Name: Johnathan Bowman MRN: [id number] Date of Birth: November 15, 1929  Today's Date: 01/05/2013 Time:  - 730-835 and 1330-1400 Total minutes=95   Skilled Therapeutic Interventions/Progress Updates: AM session:ADL in w/c at sink. Initially worked on accurate  Communication regarding patient's current level of comfort, fatigue and pain.  Patient c/o fatigued and required extra time to complete steps of tasks.  He often closed eyes or sighed during session.    Also labored fine motor and visual accuracy.  He indicated he could not see as well as prior to the CVA.  PM Session:  Upon approach patient sleeping in his chair.  Pt. Donned and doffed left shoe with Min A and then drifted off to sleep.  When awoken and asked if he was o'kay, patient wrote that he was "too tired and feel bad;"  Patient assisted back to bed to rest before upcoming PT session scheduled for 1.5 hours later.   Patient was able to get himself positioned in bed with very minimal assistance.     Therapy Documentation Precautions:  Precautions Precautions: Fall Precaution Comments: NPO, PEG tube Restrictions Weight Bearing Restrictions: No  Pain:8/10 location not expressed by patient; RN had given meds prior   See FIM for current functional status  Therapy/Group: Individual Therapy  Bud Face [facility] 01/05/2013, 7:59 AM

## 2013-01-05 NOTE — Progress Notes (Signed)
Patient ID: VELMA HANNA, male   DOB: 1929-02-26, 79 y.o.   MRN: [id number]  Subjective/Complaints:  01/05/13.  79 year old patient admitted with functional deficits due  to left MCA and PCA infarcts. Patient has a history of stage IV lung cancer as well as COPD. He remains on Lovenox for DVT prophylaxis. Diabetes is being managed with basal and bolus insulin therapy with reasonable glycemic control  Examination-patient is alert and answers questions with nodding of his head. Nasal cannula oxygen in place; chest revealed coarse bilateral rhonchi; cardiovascular faint heart sounds; abdomen soft with active bowel sounds no distention; extremities negative  Objective: Vital Signs: Blood pressure 154/92, pulse 102, temperature 97.8 F (36.6 C), temperature source Oral, resp. rate 20, height 5\' 5"  (1.651 m), weight 80.2 kg (176 lb 12.9 oz), SpO2 96.00%. No results found.  Basename 01/03/13 0531  WBC 5.3  HGB 10.2*  HCT 33.0*  PLT 332    Basename 01/03/13 0531  NA 138  K 4.0  CL 103  GLUCOSE 202*  BUN 13  CREATININE 0.96  CALCIUM 9.2   CBG (last 3)   Basename 01/05/13 0601 01/05/13 0120 01/04/13 1617  GLUCAP 144* 217* 193*    Wt Readings from Last 3 Encounters:  01/05/13 80.2 kg (176 lb 12.9 oz)  12/27/12 76.8 kg (169 lb 5 oz)  10/24/12 83.961 kg (185 lb 1.6 oz)    Impression-status post thrombotic left MCA and PCA stroke. We'll continue Lovenox DVT prophylaxis and aspirin therapy Diabetes mellitus-stable we'll continue bolus and basal insulin therapy Lung cancer    Physical Exam:  Constitutional: He appears well-developed and well-nourished.  Fatigued appearing male.   HENT:  Head: Normocephalic and atraumatic.  Eyes: Pupils are equal, round, and reactive to light.  Neck: Normal range of motion.  Cardiovascular: Normal rate and regular rhythm. No murmurs auscultated  Pulmonary/Chest: Effort normal. He has rhonchi.  Audible wet sounds due to pocketing of saliva  with coarse sounds throughout. No focal rales auscultated.  Abdominal: Soft. Bowel sounds are normal. There is tenderness (around PEG site.). There is no rebound and no guarding. He is slightly distended Musculoskeletal: He exhibits no edema and no tenderness.  Neurological: He is alert.   speech wet and garbled. Weak cough.head nods to y/n questions.  right facial weakness and poor oropharyngeal control. Can minimally protrude tongue.  Weak cough. Follows basic commands without difficulty and moves all four but displays pronator drift on the RUE and decreased FMC as a whole on the right side. Strength 4/5 right upper and lower and 4+ on the left. He does withdraw to pain on the both sides Skin: Skin is warm and dry.    Assessment/Plan: 1. Functional deficits secondary to thrombotic left MCA and PCA infarcts which require 3+ hours per day of interdisciplinary therapy in a comprehensive inpatient rehab setting. Physiatrist is providing close team supervision and 24 hour management of active medical problems listed below. Physiatrist and rehab team continue to assess barriers to discharge/monitor patient progress toward functional and medical goals. FIM: FIM - Bathing Bathing Steps Patient Completed: Chest;Right Arm;Left Arm;Abdomen;Right upper leg;Left upper leg Bathing: 3: Mod-Patient completes 5-7 46f 10 parts or 50-74%  FIM - Upper Body Dressing/Undressing Upper body dressing/undressing: 0: Wears gown/pajamas-no public clothing FIM - Lower Body Dressing/Undressing Lower body dressing/undressing: 0: Wears gown/pajamas-no public clothing  FIM - Toileting Toileting steps completed by patient: Adjust clothing prior to toileting;Performs perineal hygiene;Adjust clothing after toileting Toileting: 4: Steadying assist  FIM - Toilet  Transfers Museum/gallery curator Devices: Therapist, music Transfers: 4-From toilet/BSC: Min A (steadying Pt. > 75%);4-To toilet/BSC: Min A (steadying Pt. >  75%)  FIM - Bed/Chair Transfer Bed/Chair Transfer: 4: Chair or W/C > Bed: Min A (steadying Pt. > 75%);4: Bed > Chair or W/C: Min A (steadying Pt. > 75%)  FIM - Locomotion: Wheelchair Distance: 40' Locomotion: Wheelchair: 1: Travels less than 50 ft with minimal assistance (Pt.>75%) FIM - Locomotion: Ambulation Ambulation/Gait Assistance: 3: Mod assist Locomotion: Ambulation: 4: Travels 150 ft or more with minimal assistance (Pt.>75%)  Comprehension Comprehension Mode: Auditory Comprehension: 4-Understands basic 75 - 89% of the time/requires cueing 10 - 24% of the time  Expression Expression Mode: Nonverbal Expression: 3-Expresses basic 50 - 74% of the time/requires cueing 25 - 50% of the time. Needs to repeat parts of sentences.  Social Interaction Social Interaction: 4-Interacts appropriately 75 - 89% of the time - Needs redirection for appropriate language or to initiate interaction.  Problem Solving Problem Solving: 4-Solves basic 75 - 89% of the time/requires cueing 10 - 24% of the time  Memory Memory: 4-Recognizes or recalls 75 - 89% of the time/requires cueing 10 - 24% of the time  Medical Problem List and Plan:  1. DVT Prophylaxis/Anticoagulation: Pharmaceutical: Lovenox  2. ? Chronic pain: was on MS contin at home and indicating back pain. Will start MSIR prn for pain Management.  3. Mood: Mild anxiety reported. Will monitor for now. Limited due to aphasia and inability to write.  4. Neuropsych: This patient is not capable of making decisions on his/her own behalf.  5. DM type 2: TF initiated. Follow cbg's and adjust insulin appropriately. ssi coverage also. 6. Stage IV Lung cancer: Use oxygen at home per wife. Will resume. Use nebs as needed.  7. HTN: check BP on bid basis. Continue to hold Lotrel for now.  8. Dysphagia:  -continue CONTINUOUS TF  -major aspiration risk--HOB at 30 degrees or greater  -follow closely for temps, labs, etc--remains afebrile  -bedside  suction  LOS (Days) 3 A FACE TO FACE EVALUATION WAS PERFORMED  Rogelia Boga 01/05/2013 9:08 AM

## 2013-01-05 NOTE — Progress Notes (Signed)
Physical Therapy Session Note  Patient Details  Name: DREWEY BEGUE MRN: [id number] Date of Birth: 05-Nov-1929  Today's Date: 01/05/2013 Time: 1530-1600 Time Calculation (min): 30 min  Therapy Documentation Precautions:  Precautions Precautions: Fall Precaution Comments: NPO, PEG tube Restrictions Weight Bearing Restrictions: No Pain: Pain Assessment: No/denies pain Pain Score: 0-No pain  Gait Training:(15') using RW x 120' with 4L O2 via [place] with S/Mod-I. Nu-Step x 5 minutes on Level 4 for generalized mobility and transfers. Therapeutic Activity:(15') Multiple transfers sit<->stand with S/Mod-I with verbal cues for reaching back before sitting.    See FIM for current functional status  Therapy/Group: Individual Therapy  Rex Kras 01/05/2013, 3:32 PM

## 2013-01-06 ENCOUNTER — Inpatient Hospital Stay (HOSPITAL_COMMUNITY): Payer: Medicare Other | Admitting: *Deleted

## 2013-01-06 DIAGNOSIS — J159 Unspecified bacterial pneumonia: Secondary | ICD-10-CM

## 2013-01-06 DIAGNOSIS — I6330 Cerebral infarction due to thrombosis of unspecified cerebral artery: Secondary | ICD-10-CM

## 2013-01-06 LAB — GLUCOSE, CAPILLARY
Glucose-Capillary: 177 mg/dL — ABNORMAL HIGH (ref 70–99)
Glucose-Capillary: 178 mg/dL — ABNORMAL HIGH (ref 70–99)

## 2013-01-06 NOTE — Progress Notes (Signed)
Patient ID: Johnathan Bowman, male   DOB: Nov 19, 1929, 79 y.o.   MRN: [id number]  Patient ID: Johnathan Bowman, male   DOB: 17-Sep-1929, 79 y.o.   MRN: [id number]  Subjective/Complaints:  01/06/13.  79 year old patient admitted with functional deficits due  to left MCA and PCA infarcts. Patient has a history of stage IV lung cancer as well as COPD. He remains on Lovenox for DVT prophylaxis. Diabetes is being managed with basal and bolus insulin therapy with reasonable glycemic control;  Pain is well managed with tylenol or tramadol and refuses MS;  Has had some small residual volumes with PEG feedings.  Examination-patient is alert and answers questions with nodding of his head. Nasal cannula oxygen in place; chest revealed coarse bilateral rhonchi; cardiovascular faint heart sounds; abdomen soft with active bowel sounds no distention;  S/p PEG;  extremities negative  Objective: Vital Signs: Blood pressure 132/83, pulse 88, temperature 98.3 F (36.8 C), temperature source Oral, resp. rate 18, height 5\' 5"  (1.651 m), weight 80.2 kg (176 lb 12.9 oz), SpO2 96.00%. No results found. No results found for this basename: WBC:2,HGB:2,HCT:2,PLT:2 in the last 72 hours No results found for this basename: NA:2,K:2,CL:2,CO:2,GLUCOSE:2,BUN:2,CREATININE:2,CALCIUM:2 in the last 72 hours CBG (last 3)   Basename 01/06/13 0645 01/06/13 0012 01/05/13 1746  GLUCAP 127* 178* 173*    Wt Readings from Last 3 Encounters:  01/05/13 80.2 kg (176 lb 12.9 oz)  12/27/12 76.8 kg (169 lb 5 oz)  10/24/12 83.961 kg (185 lb 1.6 oz)    Impression-status post thrombotic left MCA and PCA stroke. We'll continue Lovenox DVT prophylaxis and aspirin therapy; d/c MS Diabetes mellitus-stable we'll continue bolus and basal insulin therapy Lung cancer    Physical Exam:  Constitutional: He appears well-developed and well-nourished.  Fatigued appearing male.   HENT:  Head: Normocephalic and atraumatic.  Eyes: Pupils are equal,  round, and reactive to light.  Neck: Normal range of motion.  Cardiovascular: Normal rate and regular rhythm. No murmurs auscultated  Pulmonary/Chest: Effort normal. He has rhonchi.  Audible wet sounds due to pocketing of saliva with coarse sounds throughout. No focal rales auscultated.  Abdominal: Soft. Bowel sounds are normal. There is tenderness (around PEG site.). There is no rebound and no guarding. He is slightly distended Musculoskeletal: He exhibits no edema and no tenderness.  Neurological: He is alert.   speech wet and garbled. Weak cough.head nods to y/n questions.  right facial weakness and poor oropharyngeal control. Can minimally protrude tongue.  Weak cough. Follows basic commands without difficulty and moves all four but displays pronator drift on the RUE and decreased FMC as a whole on the right side. Strength 4/5 right upper and lower and 4+ on the left. He does withdraw to pain on the both sides Skin: Skin is warm and dry.    Assessment/Plan: 1. Functional deficits secondary to thrombotic left MCA and PCA infarcts which require 3+ hours per day of interdisciplinary therapy in a comprehensive inpatient rehab setting. Physiatrist is providing close team supervision and 24 hour management of active medical problems listed below. Physiatrist and rehab team continue to assess barriers to discharge/monitor patient progress toward functional and medical goals. FIM: FIM - Bathing Bathing Steps Patient Completed: Chest;Right Arm;Left Arm;Abdomen;Front perineal area;Right upper leg;Left upper leg Bathing: 3: Mod-Patient completes 5-7 61f 10 parts or 50-74%  FIM - Upper Body Dressing/Undressing Upper body dressing/undressing steps patient completed: Thread/unthread right sleeve of pullover shirt/dresss;Thread/unthread left sleeve of pullover shirt/dress;Put head through opening of pull  over shirt/dress;Pull shirt over trunk Upper body dressing/undressing: 0: Wears gown/pajamas-no public  clothing FIM - Lower Body Dressing/Undressing Lower body dressing/undressing steps patient completed: Pull underwear up/down (was already wearing underwear and had no pants) Lower body dressing/undressing: 4: Min-Patient completed 75 plus % of tasks (elected not to wash or doff socks or shoes today)  FIM - Toileting Toileting steps completed by patient: Adjust clothing prior to toileting;Performs perineal hygiene;Adjust clothing after toileting Toileting: 0: Activity did not occur  FIM - Diplomatic Services operational officer Devices: Grab bars Toilet Transfers: 0-Activity did not occur  FIM - Games developer Transfer: 5: Supine > Sit: Supervision (verbal cues/safety issues);4: Bed > Chair or W/C: Min A (steadying Pt. > 75%)  FIM - Locomotion: Wheelchair Distance: 40' Locomotion: Wheelchair: 1: Travels less than 50 ft with minimal assistance (Pt.>75%) FIM - Locomotion: Ambulation Ambulation/Gait Assistance: 3: Mod assist Locomotion: Ambulation: 4: Travels 150 ft or more with minimal assistance (Pt.>75%)  Comprehension Comprehension Mode: Auditory Comprehension: 4-Understands basic 75 - 89% of the time/requires cueing 10 - 24% of the time  Expression Expression Mode: Nonverbal Expression: 3-Expresses basic 50 - 74% of the time/requires cueing 25 - 50% of the time. Needs to repeat parts of sentences.  Social Interaction Social Interaction: 4-Interacts appropriately 75 - 89% of the time - Needs redirection for appropriate language or to initiate interaction.  Problem Solving Problem Solving: 4-Solves basic 75 - 89% of the time/requires cueing 10 - 24% of the time  Memory Memory: 4-Recognizes or recalls 75 - 89% of the time/requires cueing 10 - 24% of the time  Medical Problem List and Plan:  1. DVT Prophylaxis/Anticoagulation: Pharmaceutical: Lovenox  2. ? Chronic pain: was on MS contin at home and indicating back pain. Will start MSIR prn for pain Management.   3. Mood: Mild anxiety reported. Will monitor for now. Limited due to aphasia and inability to write.  4. Neuropsych: This patient is not capable of making decisions on his/her own behalf.  5. DM type 2: TF initiated. Follow cbg's and adjust insulin appropriately. ssi coverage also. 6. Stage IV Lung cancer: Use oxygen at home per wife. Will resume. Use nebs as needed.  7. HTN: check BP on bid basis. Continue to hold Lotrel for now.  8. Dysphagia:  -continue CONTINUOUS TF  -major aspiration risk--HOB at 30 degrees or greater  -follow closely for temps, labs, etc--remains afebrile  -bedside suction  LOS (Days) 4 A FACE TO FACE EVALUATION WAS PERFORMED  Rogelia Boga 01/06/2013 8:49 AM

## 2013-01-06 NOTE — Progress Notes (Signed)
Physical Therapy Session Note  Patient Details  Name: Johnathan Bowman MRN: [id number] Date of Birth: 04-29-1929  Today's Date: 01/06/2013 Time: 0400-0425 Time Calculation (min): 25 min  Therapy Documentation Precautions:  Precautions Precautions: Fall Precaution Comments: NPO, PEG tube Restrictions Weight Bearing Restrictions: No  Pain: Pain Assessment Pain Assessment: No/denies pain  Locomotion : Ambulation Ambulation/Gait Assistance: 4: Min assist   Gait training on a distance of 150 feet with RW with min A due to initial LOB, patient on O2 at all times. Sit to stand training from the w/c using armrests to push up x 10. Static standing balance training to reduce initial sway. Transfer to reclining chair with min A, increased assistance required due to fatigue. Patient reported pressure in his chest and wanted to just stay in the chair, nursing has been informed and has performed upper respiratory hygiene to assure patients comfort.  See FIM for current functional status  Therapy/Group: Individual Therapy  Dorna Mai 01/06/2013, 4:45 PM

## 2013-01-07 ENCOUNTER — Inpatient Hospital Stay (HOSPITAL_COMMUNITY): Payer: Medicare Other | Admitting: Physical Therapy

## 2013-01-07 ENCOUNTER — Inpatient Hospital Stay (HOSPITAL_COMMUNITY): Payer: Medicare Other | Admitting: Occupational Therapy

## 2013-01-07 ENCOUNTER — Inpatient Hospital Stay (HOSPITAL_COMMUNITY): Payer: Medicare Other | Admitting: Speech Pathology

## 2013-01-07 ENCOUNTER — Encounter (HOSPITAL_COMMUNITY): Payer: Medicare Other | Admitting: Occupational Therapy

## 2013-01-07 LAB — GLUCOSE, CAPILLARY
Glucose-Capillary: 170 mg/dL — ABNORMAL HIGH (ref 70–99)
Glucose-Capillary: 233 mg/dL — ABNORMAL HIGH (ref 70–99)

## 2013-01-07 MED ORDER — HYDROCODONE-ACETAMINOPHEN 5-325 MG PO TABS
1.0000 | ORAL_TABLET | ORAL | Status: DC | PRN
  Administered 2013-01-07 – 2013-01-16 (×25): 1 via ORAL
  Filled 2013-01-07 (×26): qty 1

## 2013-01-07 MED ORDER — INSULIN GLARGINE 100 UNIT/ML ~~LOC~~ SOLN
5.0000 [IU] | Freq: Every day | SUBCUTANEOUS | Status: DC
  Administered 2013-01-07 – 2013-01-10 (×4): 5 [IU] via SUBCUTANEOUS

## 2013-01-07 NOTE — Progress Notes (Signed)
Patient continues to refuse Morphine sol 10 mg this am.

## 2013-01-07 NOTE — Progress Notes (Signed)
Subjective/Complaints: No news issues. Slept in chair. Refused morphine yesterday  A 12 point review of systems has been performed and if not noted above is otherwise negative.   Objective: Vital Signs: Blood pressure 146/85, pulse 97, temperature 98.4 F (36.9 C), temperature source Oral, resp. rate 17, height 5\' 5"  (1.651 m), weight 80.2 kg (176 lb 12.9 oz), SpO2 100.00%. No results found. No results found for this basename: WBC:2,HGB:2,HCT:2,PLT:2 in the last 72 hours No results found for this basename: NA:2,K:2,CL:2,CO:2,GLUCOSE:2,BUN:2,CREATININE:2,CALCIUM:2 in the last 72 hours CBG (last 3)   Basename 01/07/13 0730 01/07/13 0616 01/07/13 0025  GLUCAP 170* 114* 233*    Wt Readings from Last 3 Encounters:  01/05/13 80.2 kg (176 lb 12.9 oz)  12/27/12 76.8 kg (169 lb 5 oz)  10/24/12 83.961 kg (185 lb 1.6 oz)    Physical Exam:  Constitutional: He appears well-developed and well-nourished.  No distress.   HENT:  Head: Normocephalic and atraumatic.  Eyes: Pupils are equal, round, and reactive to light.  Neck: Normal range of motion.  Cardiovascular: Normal rate and regular rhythm. No murmurs auscultated  Pulmonary/Chest: Effort normal. He has rhonchi.  Audible wet sounds  with coarse sounds throughout. No focal rales auscultated.  Abdominal: Soft. Bowel sounds are normal. There is tenderness (around PEG site.). There is no rebound and no guarding. He is slightly distended Musculoskeletal: He exhibits no edema and no tenderness.  Neurological: He is alert.   speech wet and garbled. Weak cough.head nods to y/n questions.  right facial weakness and poor oropharyngeal control. Can minimally protrude tongue.  Weak cough. Follows basic commands without difficulty and moves all four but displays pronator drift on the RUE and decreased FMC as a whole on the right side. Strength 4/5 right upper and lower and 4+ on the left. He does withdraw to pain on the both sides Skin: Skin is warm  and dry.    Assessment/Plan: 1. Functional deficits secondary to thrombotic left MCA and PCA infarcts which require 3+ hours per day of interdisciplinary therapy in a comprehensive inpatient rehab setting. Physiatrist is providing close team supervision and 24 hour management of active medical problems listed below. Physiatrist and rehab team continue to assess barriers to discharge/monitor patient progress toward functional and medical goals. FIM: FIM - Bathing Bathing Steps Patient Completed: Chest;Right Arm;Left Arm;Abdomen;Front perineal area;Right upper leg;Left upper leg Bathing: 3: Mod-Patient completes 5-7 66f 10 parts or 50-74%  FIM - Upper Body Dressing/Undressing Upper body dressing/undressing steps patient completed: Thread/unthread right sleeve of pullover shirt/dresss;Thread/unthread left sleeve of pullover shirt/dress;Put head through opening of pull over shirt/dress;Pull shirt over trunk Upper body dressing/undressing: 0: Wears gown/pajamas-no public clothing FIM - Lower Body Dressing/Undressing Lower body dressing/undressing steps patient completed: Pull underwear up/down (was already wearing underwear and had no pants) Lower body dressing/undressing: 4: Min-Patient completed 75 plus % of tasks (elected not to wash or doff socks or shoes today)  FIM - Toileting Toileting steps completed by patient: Adjust clothing prior to toileting;Performs perineal hygiene;Adjust clothing after toileting Toileting: 0: Activity did not occur  FIM - Diplomatic Services operational officer Devices: Grab bars Toilet Transfers: 0-Activity did not occur  FIM - Banker Devices: Bed rails Bed/Chair Transfer: 4: Chair or W/C > Bed: Min A (steadying Pt. > 75%)  FIM - Locomotion: Wheelchair Distance: 40' Locomotion: Wheelchair: 0: Activity did not occur FIM - Locomotion: Ambulation Locomotion: Ambulation Assistive Devices: Dealer Ambulation/Gait Assistance: 4: Min assist Locomotion: Ambulation:  4: Travels 150 ft or more with minimal assistance (Pt.>75%)  Comprehension Comprehension Mode: Auditory Comprehension: 4-Understands basic 75 - 89% of the time/requires cueing 10 - 24% of the time  Expression Expression Mode: Nonverbal Expression: 3-Expresses basic 50 - 74% of the time/requires cueing 25 - 50% of the time. Needs to repeat parts of sentences.  Social Interaction Social Interaction: 4-Interacts appropriately 75 - 89% of the time - Needs redirection for appropriate language or to initiate interaction.  Problem Solving Problem Solving: 4-Solves basic 75 - 89% of the time/requires cueing 10 - 24% of the time  Memory Memory: 4-Recognizes or recalls 75 - 89% of the time/requires cueing 10 - 24% of the time  Medical Problem List and Plan:  1. DVT Prophylaxis/Anticoagulation: Pharmaceutical: Lovenox  2. ? Chronic pain: was on MS contin at home and indicating back pain. Will start MSIR prn for pain Management.  3. Mood: Mild anxiety reported. Will monitor for now. Limited due to aphasia and inability to write.  4. Neuropsych: This patient is not capable of making decisions on his/her own behalf.  5. DM type 2: Add AM lantus to PM dose . ssi coverage also. 6. Stage IV Lung cancer: Use oxygen at home per wife. Will resume. Use nebs as needed.  7. HTN: check BP on bid basis. Continue to hold Lotrel for now.  8. Dysphagia:  -slow improvement if any  -continue CONTINUOUS TF  -major aspiration risk--HOB at 30 degrees or greater  -follow closely for temps, labs, etc--remains afebrile  -bedside suction  LOS (Days) 5 A FACE TO FACE EVALUATION WAS PERFORMED  Kendrik Mcshan T 01/07/2013 7:43 AM

## 2013-01-07 NOTE — Progress Notes (Addendum)
Occupational Therapy Session Note  Patient Details  Name: Johnathan Bowman MRN: [id number] Date of Birth: 1929/01/25  Today's Date: 01/07/2013 Time: 0830-0930 Time Calculation (min): 60 min  Short Term Goals: Week 1:  OT Short Term Goal 1 (Week 1): STG=LTG  Skilled Therapeutic Interventions/Progress Updates:    1:1 self care retraining at shower level (in his room). Focus on safe ambulation with RW, shower stall transfers, activity tolerance, standing tolerance, sit to stand, LB dressing with adaptive techniques (pt with increased tightness in chest with bending over)- unable to do it for very long. Pt with increased ability to keep mouth closed. Perform oral hygiene with suction.   2nd session 1:1 13:30-14:00 focus on tub transfer with introduction of tub bench for safe transfer into tub. Pt reports standing to shower prior to admit; discussion about it could be use for safer transfer and for energy conservation. Attempted to have pt cross over simulated thresholds on floor with RW; pt able to put RW over threshold but difficulty stepping over ledge ?? Secondary to vision. Functional ambulation with RW (including turning ) with close supervision to obtain laundry from laundry room and return it to room.   Therapy Documentation Precautions:  Precautions Precautions: Fall Precaution Comments: NPO, PEG tube Restrictions Weight Bearing Restrictions: No    Pain:  ongoing chest tightness and rib discomfort (RN aware)- both sessions  See FIM for current functional status  Therapy/Group: Individual Therapy  Roney Mans [facility] 01/07/2013, 3:42 PM

## 2013-01-07 NOTE — Progress Notes (Signed)
Speech Language Pathology Daily Session Notes  Patient Details  Name: Johnathan Bowman MRN: [id number] Date of Birth: September 05, 1929  Today's Date: 01/07/2013  Session 1 Time: 0930-1000 Time Calculation (min): 30 min  Session 2 Time: 1430-1500 Time Calculation: 30 min  Short Term Goals: Week 1: SLP Short Term Goal 1 (Week 1): Pt will demonstrate emergent awareness and will self-monitor secretions with Mod A question cues.  SLP Short Term Goal 2 (Week 1): Pt will demonstrate increased AP transit with ice chip trials with Max A multimodal cueing SLP Short Term Goal 3 (Week 1): Pt will perform lingual and labial ROM exercises with Max A mutlimodal cueing SLP Short Term Goal 4 (Week 1): Pt will self-monitor and correct spelling errors with Mod A question cues.  SLP Short Term Goal 5 (Week 1): Pt will demonstrate functional problem solving with supervision cues for mildly complex tasks  Skilled Therapeutic Interventions:  Session 1: Treatment focus on cognitive-linguisitic goals. Today, pt is demonstrating increased management of secretions with increased utilization of a closed oral cavity throughout session with only Mod A verbal and question cues. Pt expressed wants/needs via written expression with Min verbal cues and extra time.  Discussed utilizing a picture/communication board to increase functional communication. Pt generated a list of items he would like on the communication board with Min A question cues.    Session 2: Treatment focus on cognitive-linguistic goals. Pt required Mod verbal cues for management of secretions and reported he felt like he was "choking" on his secretions. Pt educated on decreased management of secretions and increase his penetration/aspiraiton risk of saliva. Pt utilized a Public affairs consultant to choose specific pictures to utilize for his own/personal board to increase communication of basic wants/needs. Pt also reports feeling sad and frustrated. RN made aware.    FIM:  Comprehension Comprehension Mode: Auditory Comprehension: 5-Understands basic 90% of the time/requires cueing < 10% of the time Expression Expression: 3-Expresses basic 50 - 74% of the time/requires cueing 25 - 50% of the time. Needs to repeat parts of sentences. Social Interaction Social Interaction: 4-Interacts appropriately 75 - 89% of the time - Needs redirection for appropriate language or to initiate interaction. Problem Solving Problem Solving: 4-Solves basic 75 - 89% of the time/requires cueing 10 - 24% of the time Memory Memory: 4-Recognizes or recalls 75 - 89% of the time/requires cueing 10 - 24% of the time FIM - Eating Eating Activity: 0: Activity did not occur  Pain No/Denies Pain  Therapy/Group: Individual Therapy  Jerney Baksh 01/07/2013, 11:09 AM

## 2013-01-07 NOTE — Progress Notes (Signed)
Pt. declines scheduled Mso4.  Writes " Too sleepy all day."  Would like to try another med. for pain control. Reports pain "10/10";  to "8/10" with prn Ultram.  Marissa Nestle, PAC aware. Orders received.

## 2013-01-07 NOTE — Progress Notes (Signed)
Physical Therapy Note  Patient Details  Name: Johnathan Bowman MRN: [id number] Date of Birth: [birth year] Today's Date: 01/07/2013  Time: 1100-1155 55 minutes  Pt c/o pain in ribs/sternum when he "breathes hard", eased with rest.  Gait training with RW in controlled and household environments with min steadying assist for balance.  Pt able to gait in controlled environment with supervision with RW.  Standing balance/coordination activities with pt requiring max gestural cues for coordination exercises.  Steadying assist for balance with tap ups and step ups.  Nu step for activity tolerance 2 x 3.5 minutes with 2-3 minute rest in between.  Pt limited by poor activity tolerance and pain with shortness of breath.  Individual therapy   Shellsea Borunda 01/07/2013, 4:13 PM

## 2013-01-08 ENCOUNTER — Encounter (HOSPITAL_COMMUNITY): Payer: Medicare Other | Admitting: Occupational Therapy

## 2013-01-08 ENCOUNTER — Inpatient Hospital Stay (HOSPITAL_COMMUNITY): Payer: Medicare Other | Admitting: Occupational Therapy

## 2013-01-08 ENCOUNTER — Inpatient Hospital Stay (HOSPITAL_COMMUNITY): Payer: Medicare Other | Admitting: Physical Therapy

## 2013-01-08 ENCOUNTER — Inpatient Hospital Stay (HOSPITAL_COMMUNITY): Payer: Medicare Other | Admitting: Speech Pathology

## 2013-01-08 LAB — GLUCOSE, CAPILLARY: Glucose-Capillary: 132 mg/dL — ABNORMAL HIGH (ref 70–99)

## 2013-01-08 MED ORDER — AMLODIPINE BESYLATE 5 MG PO TABS
5.0000 mg | ORAL_TABLET | Freq: Every day | ORAL | Status: DC
  Administered 2013-01-08 – 2013-01-16 (×9): 5 mg
  Filled 2013-01-08 (×11): qty 1

## 2013-01-08 NOTE — Progress Notes (Signed)
Occupational Therapy Session Note  Patient Details  Name: Johnathan Bowman MRN: [id number] Date of Birth: 10/24/1929  Today's Date: 01/08/2013 Time: 1300-1310 Time Calculation (min): 10 min  Short Term Goals: Week 1:  OT Short Term Goal 1 (Week 1): STG=LTG  Skilled Therapeutic Interventions/Progress Updates:    Pt c/o increased fatigue. Pt requested to just get into bed to rest. Assisted pt into bed  Therapy Documentation Precautions:  Precautions Precautions: Fall Precaution Comments: NPO, PEG tube Restrictions Weight Bearing Restrictions: No General: General Amount of Missed OT Time (min): 20 Minutes Vital Signs:   Pain:  ongoing pain in rib and back area- RN aware  See FIM for current functional status  Therapy/Group: Individual Therapy  Roney Mans [facility] 01/08/2013, 2:42 PM

## 2013-01-08 NOTE — Progress Notes (Addendum)
Speech Language Pathology Daily Session Note  Patient Details  Name: Johnathan Bowman MRN: [id number] Date of Birth: Jan 25, 1929  Today's Date: 01/08/2013 Time: 0930-1030 Time Calculation (min): 60 min  Short Term Goals: Week 1: SLP Short Term Goal 1 (Week 1): Pt will demonstrate emergent awareness and will self-monitor secretions with Mod A question cues.  SLP Short Term Goal 2 (Week 1): Pt will demonstrate increased AP transit with ice chip trials with Max A multimodal cueing SLP Short Term Goal 3 (Week 1): Pt will perform lingual and labial ROM exercises with Max A mutlimodal cueing SLP Short Term Goal 4 (Week 1): Pt will self-monitor and correct spelling errors with Mod A question cues.  SLP Short Term Goal 5 (Week 1): Pt will demonstrate functional problem solving with supervision cues for mildly complex tasks  Skilled Therapeutic Interventions: Treatment focus on cognitive-linguistic goals and family education. [name] and nephew present during session with focus on education in regards to current cognitive and swallowing function. Both of his family members appeared to verbalize and demonstrate minimal understanding of the pt's current impairments and function despite extensive education. Pt participated in basic cognitive task of sorting and counting change per family's request and required Max-Total A to complete task, suspect completion of task was impacted by pt's decreased vision.  Pt independently requested to use the bathroom per written expression.   FIM:  Comprehension Comprehension Mode: Auditory Comprehension: 5-Understands basic 90% of the time/requires cueing < 10% of the time Expression Expression: 3-Expresses basic 50 - 74% of the time/requires cueing 25 - 50% of the time. Needs to repeat parts of sentences. Social Interaction Social Interaction: 4-Interacts appropriately 75 - 89% of the time - Needs redirection for appropriate language or to initiate  interaction. Problem Solving Problem Solving: 3-Solves basic 50 - 74% of the time/requires cueing 25 - 49% of the time Memory Memory: 4-Recognizes or recalls 75 - 89% of the time/requires cueing 10 - 24% of the time FIM - Eating Eating Activity: 0: Activity did not occur  Pain Reports Pain in abdomen, RN made aware and medications given   Therapy/Group: Individual Therapy  Kiven Vangilder 01/08/2013, 12:26 PM

## 2013-01-08 NOTE — Progress Notes (Signed)
Social Work  Social Work Assessment and Plan  Patient Details  Name: Johnathan Bowman MRN: [id number] Date of Birth: February 19, 1929  Today's Date: 01/08/2013  Problem List:  Patient Active Problem List  Diagnosis  . PLEURAL EFFUSION, LEFT  . PULMONARY NODULE, LEFT UPPER LOBE  . ADENOCARCIMONA, LUNG, NONSMALL CELL  . DVT of leg (deep venous thrombosis)  . DVT (deep venous thrombosis)  . Cancer  . Hx of radiation therapy  . CAP (community acquired pneumonia)  . Hypoxia  . Hypercoagulopathy  . UTI (lower urinary tract infection)  . Hematuria  . Lower extremity edema  . Hypotension  . Altered mental status  . Facial weakness  . Mutism  . Lung cancer  . Acute respiratory failure  . COPD (chronic obstructive pulmonary disease)  . CVA (cerebral infarction)  . Aphasia  . Dysphagia  . Diabetes mellitus  . GERD (gastroesophageal reflux disease)   Past Medical History:  Past Medical History  Diagnosis Date  . Hypertension   . Diabetes mellitus   . Dyslipidemia   . Mini stroke 2005  . Degenerative disc disease   . GERD (gastroesophageal reflux disease)   . Osteopenia 2005  . Coronary artery disease 2005    treated medically  . BPH (benign prostatic hyperplasia)   . DVT (deep venous thrombosis)   . History of chemotherapy   . Diverticulosis     per scan 05/28/12  . Calculus of kidney     per scan 05/28/12  . Hx of radiation therapy 06/14/12 -07/05/12    left chest  . lung ca 11/12/2010    LUL   Past Surgical History:  Past Surgical History  Procedure Date  . No past surgeries   . Pleurex cath 02/08/11    left-sided pleural effusion   Social History:  reports that he quit smoking about 26 years ago. He has never used smokeless tobacco. He reports that he does not drink alcohol or use illicit drugs.  Family / Support Systems Marital Status: Married Patient Roles: Spouse;Parent Spouse/Significant Other: wife, Johnathan Bowman @ [phone] Children: One mentally  challenged son at home with patient and wife and another son local, however, very little contact with him.  Local nephews actually provide more support. Other Supports: nephew, Johnathan Bowman @ [phone] Anticipated Caregiver: Wife if she is capable. Ability/Limitations of Caregiver: Poor vision. No lifting/Limited ability. Son is limited as well. Caregiver Availability: 24/7 Family Dynamics: Nephew notes pt with some strained relationship with a son and daughter.  Nephews are very supportive and are assisting in any way they can.  Social History Preferred language: English Religion: Baptist Cultural Background: NA Education: HS Read: Yes Write: Yes Employment Status: Retired Fish farm manager Issues: none Guardian/Conservator: none   Abuse/Neglect Physical Abuse: Denies Verbal Abuse: Denies Sexual Abuse: Denies Exploitation of patient/patient's resources: Denies Self-Neglect: Denies  Emotional Status Pt's affect, behavior adn adjustment status: Pt attentatively listening to conversations between myself, wife and nephew.  Answers questions via white board.  Has expressed to therapists his concerns about how his wife and son are managing at home.  Has been tearful with tx at times.   Recent Psychosocial Issues: None Pyschiatric History: None Substance Abuse History: None  Patient / Family Perceptions, Expectations & Goals Pt/Family understanding of illness & functional limitations: Pt aware he has suffered a stroke which has affected his swallowing and speech.  Wife insists "...he had a stroke in his throat."  ST is attempting to educate wife.  Wfie with very basic understanding of pt' functional deficits and anticipated care needs at home. 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 Was fully independent. Anticipated changes in roles/activities/participation: Pt will now require assistance with feedings, home management and finances. Pt/family  expectations/goals: Wife hopeful pt could reach a level that he could d/c home - pt does, too, however, admits concerns over wife's ability to meet care needs.  Community Resources [employer]: None Premorbid Home Care/DME Agencies: None Transportation available at discharge: yes - most likely nephews  Discharge Planning Living Arrangements: Spouse/significant other;Children (Wife and son ) Support Systems: Spouse/significant other;Other relatives;Church/faith community Type of Residence: Private residence Insurance Resources: [employer] Financial Resources: Restaurant manager, fast food Screen Referred: No Living Expenses: Motgage Money Management: Patient Do you have any problems obtaining your medications?: No Home Management: pt was handling most of this PTA.  Wife now managing with assistance of son as he is able. Patient/Family Preliminary Plans: Pt and wife had initialy hoped pt could return home, however, discussions with team about care needs have led to change in plan to SNF Barriers to Discharge: Self care;Family Support Social Work Anticipated Follow Up Needs: SNF Expected length of stay: TBD on SNF bed availability.  Clinical Impression Pleasant, oriented gentleman here after CVA which has affected his speech and swallowing.  Wife is supportive, however, likely cannot meet pt's anticipated assistance needs for home.  Will assist with d/c planning needs and psychosocial support needs.  Will screen pt for depression as well as this is concern for pt.  Johnathan Bowman 01/08/2013, 1:11 PM

## 2013-01-08 NOTE — Progress Notes (Signed)
Subjective/Complaints: No news issues. Up in bed. Communicating with daughter upon my entering. Wanted his "glasses tightened"  A 12 point review of systems has been performed and if not noted above is otherwise negative.   Objective: Vital Signs: Blood pressure 147/94, pulse 104, temperature 98.4 F (36.9 C), temperature source Oral, resp. rate 18, height 5\' 5"  (1.651 m), weight 80.5 kg (177 lb 7.5 oz), SpO2 95.00%. No results found. No results found for this basename: WBC:2,HGB:2,HCT:2,PLT:2 in the last 72 hours No results found for this basename: NA:2,K:2,CL:2,CO:2,GLUCOSE:2,BUN:2,CREATININE:2,CALCIUM:2 in the last 72 hours CBG (last 3)   Basename 01/08/13 0720 01/08/13 0644 01/07/13 2031  GLUCAP 152* 137* 201*    Wt Readings from Last 3 Encounters:  01/08/13 80.5 kg (177 lb 7.5 oz)  12/27/12 76.8 kg (169 lb 5 oz)  10/24/12 83.961 kg (185 lb 1.6 oz)    Physical Exam:  Constitutional: He appears well-developed and well-nourished.  No distress.   HENT:  Head: Normocephalic and atraumatic.  Eyes: Pupils are equal, round, and reactive to light.  Neck: Normal range of motion.  Cardiovascular: Normal rate and regular rhythm. No murmurs auscultated  Pulmonary/Chest: Effort normal. He has rhonchi.  Audible wet sounds  with coarse sounds throughout. No focal rales auscultated.  Abdominal: Soft. Bowel sounds are normal. There is tenderness (around PEG site.). There is no rebound and no guarding. He is slightly distended Musculoskeletal: He exhibits no edema and no tenderness.  Neurological: He is alert.   speech wet and garbled.--minimal word formation.  Weak cough.head nods to y/n questions. Can communicate thru dry erase board.  right facial weakness and poor oropharyngeal control. Can minimally protrude tongue.  Weak cough. Follows basic commands without difficulty and moves all four but displays pronator drift on the RUE and decreased FMC as a whole on the right side. Strength 4 to  4+ right upper and lower and 4+ on the left. He does withdraw to pain on the both sides Skin: Skin is warm and dry.    Assessment/Plan: 1. Functional deficits secondary to thrombotic left MCA and PCA infarcts which require 3+ hours per day of interdisciplinary therapy in a comprehensive inpatient rehab setting. Physiatrist is providing close team supervision and 24 hour management of active medical problems listed below. Physiatrist and rehab team continue to assess barriers to discharge/monitor patient progress toward functional and medical goals. FIM: FIM - Bathing Bathing Steps Patient Completed: Chest;Right Arm;Left Arm;Abdomen;Front perineal area;Buttocks;Right lower leg (including foot);Left upper leg;Right upper leg;Left lower leg (including foot) Bathing: 5: Supervision: Safety issues/verbal cues  FIM - Upper Body Dressing/Undressing Upper body dressing/undressing steps patient completed: Thread/unthread right sleeve of pullover shirt/dresss;Thread/unthread left sleeve of pullover shirt/dress;Put head through opening of pull over shirt/dress;Pull shirt over trunk Upper body dressing/undressing: 5: Set-up assist to: Obtain clothing/put away FIM - Lower Body Dressing/Undressing Lower body dressing/undressing steps patient completed: Thread/unthread right underwear leg;Thread/unthread left underwear leg;Thread/unthread right pants leg;Pull underwear up/down;Thread/unthread left pants leg;Pull pants up/down;Don/Doff left sock;Don/Doff right sock;Don/Doff left shoe;Don/Doff right shoe Lower body dressing/undressing: 5: Set-up assist to: Obtain clothing  FIM - Toileting Toileting steps completed by patient: Adjust clothing prior to toileting;Performs perineal hygiene;Adjust clothing after toileting Toileting: 5: Supervision: Safety issues/verbal cues  FIM - Diplomatic Services operational officer Devices: Grab bars;Walker Toilet Transfers: 5-To toilet/BSC: Supervision (verbal  cues/safety issues);5-From toilet/BSC: Supervision (verbal cues/safety issues)  FIM - Press photographer Assistive Devices: Bed rails Bed/Chair Transfer: 5: Supine > Sit: Supervision (verbal cues/safety issues);5: Bed > Chair  or W/C: Supervision (verbal cues/safety issues);5: Chair or W/C > Bed: Supervision (verbal cues/safety issues)  FIM - Locomotion: Wheelchair Distance: 40' Locomotion: Wheelchair: 0: Activity did not occur FIM - Locomotion: Ambulation Locomotion: Ambulation Assistive Devices: Designer, industrial/product Ambulation/Gait Assistance: 4: Min assist Locomotion: Ambulation: 4: Travels 150 ft or more with minimal assistance (Pt.>75%)  Comprehension Comprehension Mode: Auditory Comprehension: 5-Understands basic 90% of the time/requires cueing < 10% of the time  Expression Expression Mode: Verbal Expression: 3-Expresses basic 50 - 74% of the time/requires cueing 25 - 50% of the time. Needs to repeat parts of sentences.  Social Interaction Social Interaction: 4-Interacts appropriately 75 - 89% of the time - Needs redirection for appropriate language or to initiate interaction.  Problem Solving Problem Solving: 4-Solves basic 75 - 89% of the time/requires cueing 10 - 24% of the time  Memory Memory: 4-Recognizes or recalls 75 - 89% of the time/requires cueing 10 - 24% of the time  Medical Problem List and Plan:  1. DVT Prophylaxis/Anticoagulation: Pharmaceutical: Lovenox  2. ? Chronic pain: was on MS contin at home and indicating back pain. Will start MSIR prn for pain Management.  3. Mood: Mild anxiety reported. Will monitor for now. Limited due to aphasia and inability to write.  4. Neuropsych: This patient is not capable of making decisions on his/her own behalf.  5. DM type 2: Added AM lantus to PM dose . ssi coverage also.--follow for trend 6. Stage IV Lung cancer: Use oxygen at home per wife. Will resume. Use nebs as needed.  7. HTN: check BP on bid  basis. On lotrel PTA, will resume amlodipine 8. Dysphagia:  -minimal improvement  -continue CONTINUOUS TF  -major aspiration risk--HOB at 30 degrees or greater, reviewed with daughter  -follow closely for temps, labs, etc--remains afebrile  -bedside suction  LOS (Days) 6 A FACE TO FACE EVALUATION WAS PERFORMED  Janeisha Ryle T 01/08/2013 7:48 AM

## 2013-01-08 NOTE — Progress Notes (Signed)
Physical Therapy Note  Patient Details  Name: Johnathan Bowman MRN: [id number] Date of Birth: [birth year] Today's Date: 01/08/2013  Time: 1100-1143 43 minutes  Pt c/o pain in ribs/sternum with activity, meds rec'd before session.  Gait with RW with supervision throughout unit.  Stair training with B handrails x 1 flight with supervision, pt with 1 LOB he was able to self correct.  Standing on foam surface without UE support with min A for reaching/tossing game.  Pt able to self correct minor LOB but requires min-mod A to correct large LOB.  Seated/standing therex for LE strength and general activity tolerance 2 x 20 all exercises with pt requiring min verbal and gesture cues to perform.  Individual therapy   [name] 01/08/2013, 11:41 AM

## 2013-01-08 NOTE — Progress Notes (Signed)
Occupational Therapy Session Note  Patient Details  Name: TYREON FRIGON MRN: [id number] Date of Birth: 03-Mar-1929  Today's Date: 01/08/2013 Time: 0830-0930 Time Calculation (min): 60 min  Short Term Goals: Week 1:  OT Short Term Goal 1 (Week 1): STG=LTG  Skilled Therapeutic Interventions/Progress Updates:    1:1 self care retraining at shower level. Pt with increased pain today so rode in w/c down to ADL apartment. Focus on short distance with safe use of RW with cuing and steady A with turning, use of tub bench for transfer into and out of tub, standing balance, activity tolerance and sit to stands with recall of proper hand placement. Oral care with suction with min A. Wife present when returned back to room and provide basic education on what pt has been doing in therapy and what he will need help with at d/c. Wife will need lots of opportunities to learn if she is the one providing care to pt.   Therapy Documentation Precautions:  Precautions Precautions: Fall Precaution Comments: NPO, PEG tube Restrictions Weight Bearing Restrictions: No General:   Vital Signs:   Pain:  7/10 pain requested meds from RN for pain around back and rib cage  See FIM for current functional status  Therapy/Group: Individual Therapy  Roney Mans [facility] 01/08/2013, 10:01 AM

## 2013-01-09 ENCOUNTER — Encounter (HOSPITAL_COMMUNITY): Payer: Medicare Other | Admitting: Occupational Therapy

## 2013-01-09 ENCOUNTER — Inpatient Hospital Stay (HOSPITAL_COMMUNITY): Payer: Medicare Other | Admitting: Physical Therapy

## 2013-01-09 ENCOUNTER — Inpatient Hospital Stay (HOSPITAL_COMMUNITY): Payer: Medicare Other | Admitting: Speech Pathology

## 2013-01-09 LAB — GLUCOSE, CAPILLARY
Glucose-Capillary: 148 mg/dL — ABNORMAL HIGH (ref 70–99)
Glucose-Capillary: 199 mg/dL — ABNORMAL HIGH (ref 70–99)
Glucose-Capillary: 243 mg/dL — ABNORMAL HIGH (ref 70–99)
Glucose-Capillary: 74 mg/dL (ref 70–99)
Glucose-Capillary: 92 mg/dL (ref 70–99)

## 2013-01-09 LAB — CREATININE, SERUM: Creatinine, Ser: 0.81 mg/dL (ref 0.50–1.35)

## 2013-01-09 MED ORDER — FENTANYL 12 MCG/HR TD PT72
12.5000 ug | MEDICATED_PATCH | TRANSDERMAL | Status: DC
  Administered 2013-01-09 – 2013-01-15 (×3): 12.5 ug via TRANSDERMAL
  Filled 2013-01-09 (×3): qty 1

## 2013-01-09 MED ORDER — CITALOPRAM HYDROBROMIDE 10 MG PO TABS
10.0000 mg | ORAL_TABLET | Freq: Every day | ORAL | Status: DC
  Administered 2013-01-09 – 2013-01-15 (×7): 10 mg
  Filled 2013-01-09 (×9): qty 1

## 2013-01-09 NOTE — Progress Notes (Signed)
Physical Therapy Note  Patient Details  Name: Johnathan Bowman MRN: [id number] Date of Birth: 28-Mar-1929 Today's Date: 01/09/2013  Time: 1130-1200 30 minutes  Pt c/o pain in sternum, ribs, RN made aware, pain patch rec'd.  Treatment focused on activity tolerance training.  Gait multiple attempts and distance > 150' with supervision with RW.  Nu step x 6.5 minutes level 4 with 1 x 2 minute rest break during pedaling.  Sit to stand repetitions with supervision.  Pt agreeable to participate but with increased depressive symptoms noted.  Individual therapy   DONAWERTH,KAREN 01/09/2013, 11:56 AM

## 2013-01-09 NOTE — Progress Notes (Signed)
Occupational Therapy Session Note  Patient Details  Name: AIRIK GOODLIN MRN: [id number] Date of Birth: 01/12/1929  Today's Date: 01/09/2013 Time: 0830-0930 Time Calculation (min): 60 min  Short Term Goals: Week 1:  OT Short Term Goal 1 (Week 1): STG=LTG  Skilled Therapeutic Interventions/Progress Updates:    1:1 self care retraining at sink level. Pt requested to sponge bathe today. Focus on sit to stands with proper hand placement, standing balance without UE support challenging dynamic balance, one limb stance to remove LB clothing, simple problem solving, functional communication via white board. Enlarge our Bible's print (pt doesn't have his copy here); pt reports able to see it larger but reports a "shadow" making it challenging to read; results in him loosing his place making reading very difficult. Discussed with SW able to get Bible on Tape for pt.   Therapy Documentation Precautions:  Precautions Precautions: Fall Precaution Comments: NPO, PEG tube Restrictions Weight Bearing Restrictions: No Pain:  7/10 pain - RN aware and brought meds after session  See FIM for current functional status  Therapy/Group: Individual Therapy  Roney Mans [facility] 01/09/2013, 12:19 PM

## 2013-01-09 NOTE — Patient Care Conference (Signed)
Inpatient RehabilitationTeam Conference and Plan of Care Update Date: 01/08/2013   Time: 3:00 PM    Patient Name: Johnathan Bowman      Medical Record Number: [id number]  Date of Birth: 1929-04-14 Sex: Male         Room/Bed: 4008/4008-01 Payor Info: Payor: MEDICARE  Plan: MEDICARE PART A AND B  Product Type: *No Product type*     Admitting Diagnosis: L CVA  Admit Date/Time:  01/02/2013  3:34 PM Admission Comments: No comment available   Primary Diagnosis:  CVA (cerebral infarction) Principal Problem: CVA (cerebral infarction)  Patient Active Problem List   Diagnosis Date Noted  . Dysphagia 12/30/2012  . Diabetes mellitus 12/30/2012  . GERD (gastroesophageal reflux disease) 12/30/2012  . Lung cancer 12/27/2012  . Acute respiratory failure 12/27/2012  . COPD (chronic obstructive pulmonary disease) 12/27/2012  . CVA (cerebral infarction) 12/27/2012  . Aphasia 12/27/2012  . Facial weakness 12/20/2012  . Mutism 12/20/2012  . Hypotension 12/15/2012  . Altered mental status 12/15/2012  . Lower extremity edema 09/12/2012  . CAP (community acquired pneumonia) 09/10/2012  . Hypoxia 09/10/2012  . Hypercoagulopathy 09/10/2012  . UTI (lower urinary tract infection) 09/10/2012  . Hematuria 09/10/2012  . Hx of radiation therapy   . Cancer 05/30/2012  . DVT (deep venous thrombosis) 01/25/2012  . DVT of leg (deep venous thrombosis) 01/18/2012  . ADENOCARCIMONA, LUNG, NONSMALL CELL 01/12/2011  . PLEURAL EFFUSION, LEFT 12/08/2010  . PULMONARY NODULE, LEFT UPPER LOBE 12/08/2010    Expected Discharge Date: Expected Discharge Date:  (SNF)  Team Members Present: Physician leading conference: Dr. Faith Rogue Nurse Present: Carlean Purl, RN PT Present: Reggy Eye, PT OT Present: Roney Mans, OT;Ardis Rowan, Corky Crafts, OT SLP Present: Feliberto Gottron, SLP Other (Discipline and Name): Tora Duck, PPS Coordinator     Current Status/Progress Goal Weekly Team Focus    Medical   severe oropharyngeal dysphagia. TF via G tube  reduce aspiration risk,   TF management,  BP control, family ed   Bowel/Bladder   Occasional episode incontinence, Condom cath at [facility]; continent bowel.  continent with timed toileting, offer urinal  monitor   Swallow/Nutrition/ Hydration   NPO  Max A  increased management of secretions    ADL's   steady A to supervision  supervision  activity tolerance, standing balance and tolerance., functional communication   Mobility   min A  supervision  balance, activity tolerance   Communication   Mod A  Min A  increased awareness of errors in written expression, utilization of communication board    Safety/Cognition/ Behavioral Observations  Min A  Supervision  problem solving, awareness    Pain   Increased back pain last several days; refuses MSO4, vicodin 1 tab. minimal effectiveness  managed 2/10  monitor, offer PRN meds   Skin   Peg site C.D.I.;  mild redness at buttocks, blanchable  no S/S infection, no breakdown  monitor    Rehab Goals Patient on target to meet rehab goals: Yes *See Interdisciplinary Assessment and Plan and progress notes for long and short-term goals  Barriers to Discharge: severe dysphagia and associated management issues    Possible Resolutions to Barriers:  full time nursing care/supervision    Discharge Planning/Teaching Needs:  meeting goals, however, wife cannot meet pt's care needs and plan changed to SNF      Team Discussion:  SW reports change in d/c plan to SNF as wife unable to meet care needs.  Reaching goals and could medically  d/c anytime  Revisions to Treatment Plan:  Change in treatments to qd while pt awaits SNF bed.   Continued Need for Acute Rehabilitation Level of Care: The patient requires daily medical management by a physician with specialized training in physical medicine and rehabilitation for the following conditions: Daily direction of a multidisciplinary physical  rehabilitation program to ensure safe treatment while eliciting the highest outcome that is of practical value to the patient.: Yes Daily medical management of patient stability for increased activity during participation in an intensive rehabilitation regime.: Yes Daily analysis of laboratory values and/or radiology reports with any subsequent need for medication adjustment of medical intervention for : Neurological problems;Post surgical problems;Other;Pulmonary problems  [place] 01/09/2013, 12:23 PM

## 2013-01-09 NOTE — Progress Notes (Signed)
Social Work Patient ID: Johnathan Bowman, male   DOB: 01-13-1929, 79 y.o.   MRN: [id number]  Met yesterday morning with pt, wife and nephew to discuss d/c planning issues.  In anticipation of recommendations from team conference, explained to them all that pt will require supervision and some minimal assistance at home.  Caregiver will also need to be instructed on tube feedings.  Wife does not feel that she can do this at home and pt writes "It's too much for her."  Discussed the option of SNF and both pt and wife are agreeable.  Have also reviewed need to begin Medicaid application to assist with payment of SNF.  Family provided with Medicaid application. Informed tx team that d/c plan has changed to SNF.  Bed search underway.   Johnathan Hicklin, LCSW

## 2013-01-09 NOTE — Progress Notes (Signed)
Speech Language Pathology Daily Session Note  Patient Details  Name: Johnathan Bowman MRN: [id number] Date of Birth: 16-Apr-1929  Today's Date: 01/09/2013 Time: 1015-1100 Time Calculation (min): 45 min  Short Term Goals: Week 1: SLP Short Term Goal 1 (Week 1): Pt will demonstrate emergent awareness and will self-monitor secretions with Mod A question cues.  SLP Short Term Goal 2 (Week 1): Pt will demonstrate increased AP transit with ice chip trials with Max A multimodal cueing SLP Short Term Goal 3 (Week 1): Pt will perform lingual and labial ROM exercises with Max A mutlimodal cueing SLP Short Term Goal 4 (Week 1): Pt will self-monitor and correct spelling errors with Mod A question cues.  SLP Short Term Goal 5 (Week 1): Pt will demonstrate functional problem solving with supervision cues for mildly complex tasks  Skilled Therapeutic Interventions: Treatment focus on dysphagia goals.  Pt given ice chips trials, pt was unable to perform AP transit, however, once iced melted, pt was able to initiate a swallow.  Pt given 6 trials of 1 cc of thin liquid via a syringe placed in his left lateral sulci. Pt was able to close his oral cavity and initiated a swallow with all trials within ~2-3 seconds. Pt demonstrated cough X 1 with thin liquid trials were increased to  tsp. Pt educated on current swallowing function and acknowledged understanding with use of head nods.     FIM:  Comprehension Comprehension Mode: Auditory Comprehension: 5-Follows basic conversation/direction: With extra time/assistive device Expression Expression Mode: Verbal Expression: 4-Expresses basic 75 - 89% of the time/requires cueing 10 - 24% of the time. Needs helper to occlude trach/needs to repeat words. Social Interaction Social Interaction: 4-Interacts appropriately 75 - 89% of the time - Needs redirection for appropriate language or to initiate interaction. Problem Solving Problem Solving: 4-Solves basic 75 - 89% of  the time/requires cueing 10 - 24% of the time Memory Memory: 4-Recognizes or recalls 75 - 89% of the time/requires cueing 10 - 24% of the time FIM - Eating Eating Activity: 0: Activity did not occur  Pain No/Denies Pain  Therapy/Group: Individual Therapy  [name] 01/09/2013, 5:35 PM

## 2013-01-09 NOTE — Progress Notes (Addendum)
Pt with c/o's left sided back pain throughout day;  Reports "some" better ( written) with Fentanyl patch.  Vicodin x 2 today.  K-pad ordered, monitor.  No residuals today, denies abd. discomfort  as previous days.

## 2013-01-09 NOTE — Progress Notes (Addendum)
Subjective/Complaints: Complains of "gas" and 10/10 left low back/hip pain. Staff, family noting increased signs of depression A 12 point review of systems has been performed and if not noted above is otherwise negative.   Objective: Vital Signs: Blood pressure 136/81, pulse 96, temperature 98.3 F (36.8 C), temperature source Oral, resp. rate 16, height 5\' 5"  (1.651 m), weight 77.2 kg (170 lb 3.1 oz), SpO2 96.00%. No results found. No results found for this basename: WBC:2,HGB:2,HCT:2,PLT:2 in the last 72 hours  Basename 01/09/13 0550  NA --  K --  CL --  GLUCOSE --  BUN --  CREATININE 0.81  CALCIUM --   CBG (last 3)   Basename 01/09/13 0714 01/09/13 0608 01/09/13 0439  GLUCAP 148* 94 92    Wt Readings from Last 3 Encounters:  01/09/13 77.2 kg (170 lb 3.1 oz)  12/27/12 76.8 kg (169 lb 5 oz)  10/24/12 83.961 kg (185 lb 1.6 oz)    Physical Exam:  Constitutional: He appears well-developed and well-nourished.  No distress.   HENT:  Head: Normocephalic and atraumatic.  Eyes: Pupils are equal, round, and reactive to light.  Neck: Normal range of motion.  Cardiovascular: Normal rate and regular rhythm. No murmurs auscultated  Pulmonary/Chest: Effort normal.   rhonchi generally decreased. No focal rales auscultated.  Abdominal: Soft. Bowel sounds are normal. There is tenderness (around PEG site.). There is no rebound and no guarding. He is slightly distended Musculoskeletal: He exhibits no edema. Has tenderness with palpation over the left low lumbar spine and flank into the iliac crest area. Neurological: He is alert.   speech  garbled.--minimal word formation.  Weak cough.head nods to y/n questions. Can communicate thru dry erase board.  right facial weakness and poor oropharyngeal control. Still Can only minimally protrude tongue.  Weak cough. Follows basic commands without difficulty and moves all four but displays pronator drift on the RUE and decreased FMC as a whole on  the right side. Strength 4 to 4+ right upper and lower and 4+ on the left. He does withdraw to pain on the both sides Skin: Skin is warm and dry.    Assessment/Plan: 1. Functional deficits secondary to thrombotic left MCA and PCA infarcts which require 3+ hours per day of interdisciplinary therapy in a comprehensive inpatient rehab setting. Physiatrist is providing close team supervision and 24 hour management of active medical problems listed below. Physiatrist and rehab team continue to assess barriers to discharge/monitor patient progress toward functional and medical goals. FIM: FIM - Bathing Bathing Steps Patient Completed: Chest;Right Arm;Left Arm;Abdomen;Front perineal area;Buttocks;Right lower leg (including foot);Left upper leg;Right upper leg;Left lower leg (including foot) Bathing: 5: Supervision: Safety issues/verbal cues  FIM - Upper Body Dressing/Undressing Upper body dressing/undressing steps patient completed: Thread/unthread right sleeve of pullover shirt/dresss;Thread/unthread left sleeve of pullover shirt/dress;Put head through opening of pull over shirt/dress;Pull shirt over trunk Upper body dressing/undressing: 5: Set-up assist to: Obtain clothing/put away FIM - Lower Body Dressing/Undressing Lower body dressing/undressing steps patient completed: Thread/unthread right underwear leg;Thread/unthread left underwear leg;Thread/unthread right pants leg;Pull underwear up/down;Thread/unthread left pants leg;Pull pants up/down;Don/Doff left sock;Don/Doff right sock;Don/Doff left shoe;Don/Doff right shoe Lower body dressing/undressing: 5: Set-up assist to: Obtain clothing  FIM - Toileting Toileting steps completed by patient: Adjust clothing prior to toileting;Performs perineal hygiene;Adjust clothing after toileting Toileting: 5: Supervision: Safety issues/verbal cues  FIM - Diplomatic Services operational officer Devices: Grab bars;Walker Toilet Transfers: 5-To  toilet/BSC: Supervision (verbal cues/safety issues);5-From toilet/BSC: Supervision (verbal cues/safety issues)  FIM -  Bed/Chair Transport planner Devices: Bed rails Bed/Chair Transfer: 5: Bed > Chair or W/C: Supervision (verbal cues/safety issues);5: Chair or W/C > Bed: Supervision (verbal cues/safety issues)  FIM - Locomotion: Wheelchair Distance: 40' Locomotion: Wheelchair: 0: Activity did not occur FIM - Locomotion: Ambulation Locomotion: Ambulation Assistive Devices: Designer, industrial/product Ambulation/Gait Assistance: 4: Min assist Locomotion: Ambulation: 5: Travels 150 ft or more with supervision/safety issues  Comprehension Comprehension Mode: Auditory Comprehension: 5-Understands basic 90% of the time/requires cueing < 10% of the time  Expression Expression Mode: Verbal Expression: 3-Expresses basic 50 - 74% of the time/requires cueing 25 - 50% of the time. Needs to repeat parts of sentences.  Social Interaction Social Interaction: 4-Interacts appropriately 75 - 89% of the time - Needs redirection for appropriate language or to initiate interaction.  Problem Solving Problem Solving: 3-Solves basic 50 - 74% of the time/requires cueing 25 - 49% of the time  Memory Memory: 4-Recognizes or recalls 75 - 89% of the time/requires cueing 10 - 24% of the time  Medical Problem List and Plan:  1. DVT Prophylaxis/Anticoagulation: Pharmaceutical: Lovenox  2. Chronic pain: was on MS contin at home and indicating back pain but he says this makes him very tired. Will add fentanyl patch to his prn meds 3. Mood: see below.  4. Neuropsych: This patient is not capable of making decisions on his/her own behalf.  5. DM type 2: Added AM lantus to PM dose . ssi coverage also.--follow for trend 6. Stage IV Lung cancer: Use oxygen at home per wife. Will resume. Use nebs as needed.  7. HTN: checking BP on bid basis. On lotrel PTA,resumed amlodipine 8. Dysphagia:  -minimal  improvement  -continue CONTINUOUS TF  -major aspiration risk--HOB at 30 degrees or greater, reviewed with daughter  -follow closely for temps, labs, etc--remains afebrile  -bedside suction as needed.  -encouraged IS 9. Signs of reactive depression- begin low dose celexa 10mg  qhs  LOS (Days) 7 A FACE TO FACE EVALUATION WAS PERFORMED  SWARTZ,ZACHARY T 01/09/2013 8:06 AM

## 2013-01-10 ENCOUNTER — Encounter (HOSPITAL_COMMUNITY): Payer: Medicare Other | Admitting: Occupational Therapy

## 2013-01-10 ENCOUNTER — Inpatient Hospital Stay (HOSPITAL_COMMUNITY): Payer: Medicare Other | Admitting: Speech Pathology

## 2013-01-10 ENCOUNTER — Inpatient Hospital Stay (HOSPITAL_COMMUNITY): Payer: Medicare Other

## 2013-01-10 LAB — GLUCOSE, CAPILLARY: Glucose-Capillary: 90 mg/dL (ref 70–99)

## 2013-01-10 MED ORDER — INSULIN ASPART 100 UNIT/ML ~~LOC~~ SOLN
0.0000 [IU] | Freq: Four times a day (QID) | SUBCUTANEOUS | Status: DC
  Administered 2013-01-10: 2 [IU] via SUBCUTANEOUS
  Administered 2013-01-10: 3 [IU] via SUBCUTANEOUS
  Administered 2013-01-10: 8 [IU] via SUBCUTANEOUS
  Administered 2013-01-11: 3 [IU] via SUBCUTANEOUS
  Administered 2013-01-11: 2 [IU] via SUBCUTANEOUS
  Administered 2013-01-11: 11 [IU] via SUBCUTANEOUS
  Administered 2013-01-12 – 2013-01-13 (×3): 3 [IU] via SUBCUTANEOUS
  Administered 2013-01-13: 8 [IU] via SUBCUTANEOUS
  Administered 2013-01-13: 2 [IU] via SUBCUTANEOUS
  Administered 2013-01-13: 5 [IU] via SUBCUTANEOUS
  Administered 2013-01-14: 8 [IU] via SUBCUTANEOUS
  Administered 2013-01-14: 5 [IU] via SUBCUTANEOUS
  Administered 2013-01-14: 2 [IU] via SUBCUTANEOUS
  Administered 2013-01-15: 5 [IU] via SUBCUTANEOUS
  Administered 2013-01-15: 2 [IU] via SUBCUTANEOUS
  Administered 2013-01-15: 11 [IU] via SUBCUTANEOUS
  Administered 2013-01-15: 2 [IU] via SUBCUTANEOUS
  Administered 2013-01-15: 8 [IU] via SUBCUTANEOUS

## 2013-01-10 MED ORDER — HEPARIN SOD (PORK) LOCK FLUSH 100 UNIT/ML IV SOLN
500.0000 [IU] | INTRAVENOUS | Status: DC | PRN
  Filled 2013-01-10: qty 5

## 2013-01-10 MED ORDER — HEPARIN SOD (PORK) LOCK FLUSH 100 UNIT/ML IV SOLN
500.0000 [IU] | INTRAVENOUS | Status: DC
  Administered 2013-01-10: 500 [IU]
  Filled 2013-01-10: qty 5

## 2013-01-10 MED ORDER — INSULIN GLARGINE 100 UNIT/ML ~~LOC~~ SOLN
15.0000 [IU] | Freq: Every day | SUBCUTANEOUS | Status: DC
  Administered 2013-01-11 – 2013-01-16 (×6): 15 [IU] via SUBCUTANEOUS

## 2013-01-10 NOTE — Progress Notes (Signed)
Subjective/Complaints: Back pain better with fentanyl patch. No side effects. A 12 point review of systems has been performed and if not noted above is otherwise negative.   Objective: Vital Signs: Blood pressure 128/66, pulse 65, temperature 98.1 F (36.7 C), temperature source Oral, resp. rate 18, height 5\' 5"  (1.651 m), weight 80.7 kg (177 lb 14.6 oz), SpO2 96.00%. No results found. No results found for this basename: WBC:2,HGB:2,HCT:2,PLT:2 in the last 72 hours  Basename 01/09/13 0550  NA --  K --  CL --  GLUCOSE --  BUN --  CREATININE 0.81  CALCIUM --   CBG (last 3)   Basename 01/10/13 0717 01/10/13 0545 01/09/13 2353  GLUCAP 185* 90 243*    Wt Readings from Last 3 Encounters:  01/10/13 80.7 kg (177 lb 14.6 oz)  12/27/12 76.8 kg (169 lb 5 oz)  10/24/12 83.961 kg (185 lb 1.6 oz)    Physical Exam:  Constitutional: He appears well-developed and well-nourished.  No distress.   HENT:  Head: Normocephalic and atraumatic.  Eyes: Pupils are equal, round, and reactive to light.  Neck: Normal range of motion.  Cardiovascular: Normal rate and regular rhythm. No murmurs auscultated  Pulmonary/Chest: Effort normal.   rhonchi generally decreased. No focal rales auscultated.  Abdominal: Soft. Bowel sounds are normal. There is tenderness (around PEG site.). There is no rebound and no guarding. He is slightly distended Musculoskeletal: He exhibits no edema. Has tenderness with palpation over the left low lumbar spine and flank into the iliac crest area. Neurological: He is very alert.   speech  garbled.--minimal word formation.  Weak cough.head nods to y/n questions. Can communicate thru dry erase board.  right facial weakness and poor oropharyngeal control. Still Can only minimally protrude tongue.  Weak cough. Follows basic commands without difficulty and moves all four but displays pronator drift on the RUE and decreased FMC as a whole on the right side. Strength 4 to 4+ right  upper and lower and 4+ on the left. He has pain sense on all 4 limbs Skin: Skin is warm and dry.    Assessment/Plan: 1. Functional deficits secondary to thrombotic left MCA and PCA infarcts which require 3+ hours per day of interdisciplinary therapy in a comprehensive inpatient rehab setting. Physiatrist is providing close team supervision and 24 hour management of active medical problems listed below. Physiatrist and rehab team continue to assess barriers to discharge/monitor patient progress toward functional and medical goals. FIM: FIM - Bathing Bathing Steps Patient Completed: Chest;Right Arm;Left Arm;Abdomen;Front perineal area;Buttocks;Right lower leg (including foot);Left upper leg;Right upper leg;Left lower leg (including foot) Bathing: 5: Supervision: Safety issues/verbal cues  FIM - Upper Body Dressing/Undressing Upper body dressing/undressing steps patient completed: Thread/unthread right sleeve of pullover shirt/dresss;Thread/unthread left sleeve of pullover shirt/dress;Put head through opening of pull over shirt/dress;Pull shirt over trunk Upper body dressing/undressing: 5: Set-up assist to: Obtain clothing/put away FIM - Lower Body Dressing/Undressing Lower body dressing/undressing steps patient completed: Thread/unthread right underwear leg;Thread/unthread left underwear leg;Thread/unthread right pants leg;Pull underwear up/down;Thread/unthread left pants leg;Pull pants up/down;Don/Doff left sock;Don/Doff right sock;Don/Doff left shoe;Don/Doff right shoe Lower body dressing/undressing: 5: Set-up assist to: Obtain clothing  FIM - Toileting Toileting steps completed by patient: Adjust clothing prior to toileting;Performs perineal hygiene;Adjust clothing after toileting Toileting: 4: Steadying assist  FIM - Diplomatic Services operational officer Devices: Grab bars;Walker Toilet Transfers: 5-To toilet/BSC: Supervision (verbal cues/safety issues);5-From toilet/BSC:  Supervision (verbal cues/safety issues)  FIM - Press photographer Assistive Devices: Bed rails Bed/Chair  Transfer: 5: Bed > Chair or W/C: Supervision (verbal cues/safety issues);5: Chair or W/C > Bed: Supervision (verbal cues/safety issues)  FIM - Locomotion: Wheelchair Distance: 40' Locomotion: Wheelchair: 0: Activity did not occur FIM - Locomotion: Ambulation Locomotion: Ambulation Assistive Devices: Designer, industrial/product Ambulation/Gait Assistance: 4: Min assist Locomotion: Ambulation: 5: Travels 150 ft or more with supervision/safety issues  Comprehension Comprehension Mode: Auditory Comprehension: 5-Follows basic conversation/direction: With extra time/assistive device  Expression Expression Mode: Verbal Expression: 4-Expresses basic 75 - 89% of the time/requires cueing 10 - 24% of the time. Needs helper to occlude trach/needs to repeat words.  Social Interaction Social Interaction: 4-Interacts appropriately 75 - 89% of the time - Needs redirection for appropriate language or to initiate interaction.  Problem Solving Problem Solving: 4-Solves basic 75 - 89% of the time/requires cueing 10 - 24% of the time  Memory Memory: 4-Recognizes or recalls 75 - 89% of the time/requires cueing 10 - 24% of the time  Medical Problem List and Plan:  1. DVT Prophylaxis/Anticoagulation: Pharmaceutical: Lovenox  2. Chronic pain: has done well wotj fentanyl patch in addition  to his prn hydrocodone 3. Mood: see below.  4. Neuropsych: This patient is not capable of making decisions on his/her own behalf.  5. DM type 2: increase AM lantus. continue PM dose . ssi coverage also.--follow for trend 6. Stage IV Lung cancer: Use oxygen at home per wife. Will resume. Use nebs as needed.  7. HTN: checking BP on bid basis. On lotrel PTA,resumed amlodipine 8. Dysphagia:  -minimal improvement  -continue CONTINUOUS TF  -major aspiration risk--HOB at 30 degrees or greater, reviewed  with daughter  -follow closely for temps, labs, etc--remains afebrile  -bedside suction as needed.  -encouraged IS 9. Signs of reactive depression- begin low dose celexa 10mg  qhs  LOS (Days) 8 A FACE TO FACE EVALUATION WAS PERFORMED  SWARTZ,ZACHARY T 01/10/2013 7:55 AM

## 2013-01-10 NOTE — Progress Notes (Signed)
Speech Language Pathology Daily Session Note  Patient Details  Name: Johnathan Bowman MRN: [id number] Date of Birth: 16-Jun-1929  Today's Date: 01/10/2013 Time: 7846-9629 Time Calculation (min): 45 min  Short Term Goals: Week 1: SLP Short Term Goal 1 (Week 1): Pt will demonstrate emergent awareness and will self-monitor secretions with Mod A question cues.  SLP Short Term Goal 2 (Week 1): Pt will demonstrate increased AP transit with ice chip trials with Max A multimodal cueing SLP Short Term Goal 3 (Week 1): Pt will perform lingual and labial ROM exercises with Max A mutlimodal cueing SLP Short Term Goal 4 (Week 1): Pt will self-monitor and correct spelling errors with Mod A question cues.  SLP Short Term Goal 5 (Week 1): Pt will demonstrate functional problem solving with supervision cues for mildly complex tasks  Skilled Therapeutic Interventions: Treatment focus on dysphagia and cognitive-linguistic goals. Pt demonstrated increased fatigue and lethargy and reported he was unable to open his oral cavity volitionally last night and required total A with manual facilitation to open it. Pt administered oral care and given 1 trial of thin liquid (1/2 tsp) via a syringe. Pt demonstrated a suspected significantly delayed swallow initiation (~20 seconds). Trials were then discontinued for the remainder of the session. Pt utilized written expression to communicate wants/needs and require Min question cues to self-monitor and correct errors.    FIM:  Comprehension Comprehension Mode: Auditory Comprehension: 4-Understands basic 75 - 89% of the time/requires cueing 10 - 24% of the time Expression Expression Mode: Nonverbal Expression: 4-Expresses basic 75 - 89% of the time/requires cueing 10 - 24% of the time. Needs helper to occlude trach/needs to repeat words. Social Interaction Social Interaction: 4-Interacts appropriately 75 - 89% of the time - Needs redirection for appropriate language or to  initiate interaction. Problem Solving Problem Solving: 4-Solves basic 75 - 89% of the time/requires cueing 10 - 24% of the time Memory Memory: 4-Recognizes or recalls 75 - 89% of the time/requires cueing 10 - 24% of the time FIM - Eating Eating Activity: 0: Activity did not occur  Pain Pain Assessment Pain Assessment: No/denies pain  Therapy/Group: Individual Therapy  Sophi Calligan 01/10/2013, 4:07 PM

## 2013-01-10 NOTE — Progress Notes (Signed)
Occupational Therapy Session Note  Patient Details  Name: Johnathan Bowman MRN: [id number] Date of Birth: 1929-12-09  Today's Date: 01/10/2013 Time: 7829-5621 Time Calculation (min): 45 min  Short Term Goals: Week 1:  OT Short Term Goal 1 (Week 1): STG=LTG  Skilled Therapeutic Interventions/Progress Updates:    1:1 self care retraining at recliner level (pt's choice). Pt reported increased gas in abdomen this am; MD aware.  Focus on functional ambulation around room with RW, sit to stands, standing balance without UE support with clothing management, activity tolerance. Pt reports being very fatigued and hopes to just "get well."  Therapy Documentation Precautions:  Precautions Precautions: Fall Precaution Comments: NPO, PEG tube Restrictions Weight Bearing Restrictions: No Pain:  no c/o pain  See FIM for current functional status  Therapy/Group: Individual Therapy  Roney Mans [facility] 01/10/2013, 3:29 PM

## 2013-01-10 NOTE — Progress Notes (Signed)
Physical Therapy Session Note  Patient Details  Name: Johnathan Bowman MRN: [id number] Date of Birth: 1929-12-12  Today's Date: 01/10/2013 Time: 1191-4782 Time Calculation (min): 55 min  Skilled Therapeutic Interventions/Progress Updates:    Pt participated in walking group with focus on gait through obstacle course on carpeted surface to simulate home environment including navigating obstacles, stepping over threshold, and direction changes. Pt with close S to steady A with cues for safety with RW. Dynamic standing balance activity without UE support to play bean bag toss with other patients and sit to stands. Gait back to pt room with intermittent steady A to navigate obstacles on L. Family present to observe session.   Therapy Documentation Precautions:  Precautions Precautions: Fall Precaution Comments: NPO, PEG tube Restrictions Weight Bearing Restrictions: No  Pain: Denies pain.   See FIM for current functional status  Therapy/Group: Group Therapy  Karolee Stamps [facility] 01/10/2013, 4:01 PM

## 2013-01-11 ENCOUNTER — Inpatient Hospital Stay (HOSPITAL_COMMUNITY): Payer: Medicare Other | Admitting: Speech Pathology

## 2013-01-11 ENCOUNTER — Encounter (HOSPITAL_COMMUNITY): Payer: Medicare Other | Admitting: Occupational Therapy

## 2013-01-11 ENCOUNTER — Inpatient Hospital Stay (HOSPITAL_COMMUNITY): Payer: Medicare Other | Admitting: Physical Therapy

## 2013-01-11 DIAGNOSIS — I69991 Dysphagia following unspecified cerebrovascular disease: Secondary | ICD-10-CM

## 2013-01-11 DIAGNOSIS — I10 Essential (primary) hypertension: Secondary | ICD-10-CM

## 2013-01-11 DIAGNOSIS — E1165 Type 2 diabetes mellitus with hyperglycemia: Secondary | ICD-10-CM

## 2013-01-11 DIAGNOSIS — C3490 Malignant neoplasm of unspecified part of unspecified bronchus or lung: Secondary | ICD-10-CM

## 2013-01-11 DIAGNOSIS — I6330 Cerebral infarction due to thrombosis of unspecified cerebral artery: Secondary | ICD-10-CM

## 2013-01-11 LAB — GLUCOSE, CAPILLARY
Glucose-Capillary: 178 mg/dL — ABNORMAL HIGH (ref 70–99)
Glucose-Capillary: 116 mg/dL — ABNORMAL HIGH (ref 70–99)

## 2013-01-11 MED ORDER — POLYETHYLENE GLYCOL 3350 17 G PO PACK
17.0000 g | PACK | Freq: Every day | ORAL | Status: DC
  Administered 2013-01-11 – 2013-01-16 (×6): 17 g
  Filled 2013-01-11 (×8): qty 1

## 2013-01-11 NOTE — Progress Notes (Signed)
Occupational Therapy Session Note  Patient Details  Name: Johnathan Bowman MRN: [id number] Date of Birth: 09/12/1929  Today's Date: 01/11/2013 Time: 8295-6213 60 minutes  Short Term Goals: Week 1:  OT Short Term Goal 1 (Week 1): STG=LTG  Skilled Therapeutic Interventions/Progress Updates:  Pt. Seen in room today for to work on functional mobility and activity tolerance with self care task. In bed upon arrival, however transitioned self to EOB with supervision, functionally ambulated with RW to BR for toilet transfer, toileting, and shower transfer with steady A. Pt. performed self care task at supervision with min questioning cues to complete task. Dressed at sink level with focus on sit to stands, standing tolerance without AD, and dynamic standing balance with Supervision to min steadying A. Latter portion of the session addressed visual tracking, eye convergence,and diplopia with pt. Compensating with head turns. Reports he stills has double vision in all fields except the central field.   Therapy Documentation Precautions:  Precautions Precautions: Fall Precaution Comments: NPO, PEG tube Restrictions Weight Bearing Restrictions: No Pain: Pain Assessment Pain Assessment: Faces Faces Pain Scale: Hurts little more Pain Location: Abdomen Pain Orientation: Left Pain Intervention(s): RN made aware See FIM for current functional status  Therapy/Group: Individual Therapy  Synthia Innocent, OTA/S Occupational Therapy Assistant Student 01/11/2013, 10:18 AM

## 2013-01-11 NOTE — Progress Notes (Signed)
Occupational Therapy Weekly Progress Note  Patient Details  Name: Johnathan Bowman MRN: [id number] Date of Birth: [birth year]  Today's Date: 01/11/2013  Patient continues towards his long term goals of overall supervision. Pt continues to require min A/ Steadying A for dynamic balance with functional activities. Pt continues to have pain in left rib area and in his back limiting his ability to consistently get down to his feet for dressing LB. [name] unable to provide care for pt at home due to his increased need for assistance with ADLs including oral care and tube feeds, so pt will need SNF at this time. Pt continues to be nonverbal. Pt also continues to present with diplopia in right field> left (central vision clearer).  Patient continues to demonstrate the following deficits: muscle weakness, decreased cardiorespiratoy endurance and decreased oxygen support, decreased visual perceptual skills, and decreased standing balance, decreased postural control and decreased balance strategiesand therefore will continue to benefit from skilled OT intervention to enhance overall performance with BADL and Reduce care partner burden.  Patient progressing toward long term goals..  Continue plan of care.  OT Short Term Goals Week 1:  OT Short Term Goal 1 (Week 1): STG=LTG Week 2:  OT Short Term Goal 1 (Week 2): Pt will demonstrate dynamic standing balance during functional ADL task wtih supervision  Skilled Therapeutic Interventions/Progress Updates:    continue with POC Therapy Documentation Precautions:  Precautions Precautions: Fall Precaution Comments: NPO, PEG tube Restrictions Weight Bearing Restrictions: No Pain: Pain Assessment Pain Assessment: Faces Faces Pain Scale: Hurts little more Pain Location: Abdomen Pain Orientation: Left Pain Intervention(s): RN made aware ADL:  see FIM  See FIM for current functional status  Therapy/Group: Individual Therapy  Roney Mans  [facility] 01/11/2013, 10:24 AM

## 2013-01-11 NOTE — Progress Notes (Signed)
NUTRITION FOLLOW UP  Intervention:   Continue TF per PEG with Jevity 1.2, 350 ml 5 times daily plus 200 ml free water 4 times daily which provides:  2100 kcal, 97 gm protein, 2218 ml free water daily.  Nutrition Dx:   Inadequate oral intake related to dysphagia as evidenced by NPO status.  Goal:   Tolerate TF to meet >/=90% estimated needs  Monitor:   TF tolerance and adequacy, labs, weights, diet advancement  Assessment:   Spoke with RN.  Tolerating bolus TF well. Weight increased.  Height: Ht Readings from Last 1 Encounters:  01/02/13 5\' 5"  (1.651 m)    Weight Status:   Wt Readings from Last 1 Encounters:  01/10/13 177 lb 14.6 oz (80.7 kg)    Re-estimated needs:  Kcal: 1900-2100  Protein: 95-105 gm Fluid: >1.9L daily  Skin: intact  Diet Order: NPO   Intake/Output Summary (Last 24 hours) at 01/11/13 1354 Last data filed at 01/11/13 1300  Gross per 24 hour  Intake      0 ml  Output    725 ml  Net   -725 ml    Last BM: 1/24   Labs:   Lab 01/09/13 0550  NA --  K --  CL --  CO2 --  BUN --  CREATININE 0.81  CALCIUM --  MG --  PHOS --  GLUCOSE --    CBG (last 3)   Basename 01/11/13 1130 01/11/13 0731 01/11/13 0605  GLUCAP 304* 178* 138*    Scheduled Meds:   . amLODipine  5 mg Per Tube Daily  . antiseptic oral rinse  15 mL Mouth Rinse QID  . aspirin  325 mg Per Tube Daily  . chlorhexidine  15 mL Mouth Rinse BID  . citalopram  10 mg Per Tube QHS  . enoxaparin  40 mg Subcutaneous Q24H  . feeding supplement (JEVITY 1.2 CAL)  350 mL Per Tube 5 X Daily  . fentaNYL  12.5 mcg Transdermal Q72H  . free water  200 mL Per Tube Q6H  . heparin lock flush  500 Units Intracatheter Q30 days  . insulin aspart  0-15 Units Subcutaneous Q6H  . insulin glargine  10 Units Subcutaneous QHS  . insulin glargine  15 Units Subcutaneous Daily  . pantoprazole sodium  40 mg Per Tube Daily  . polyethylene glycol  17 g Per Tube Daily    Continuous Infusions:    Oran Rein, RD, LDN Clinical Inpatient Dietitian Pager:  [phone] Weekend and after hours pager:  [phone]

## 2013-01-11 NOTE — Progress Notes (Signed)
Speech Language Pathology Session & Weekly Progress Notes  Patient Details  Name: Johnathan Bowman MRN: [id number] Date of Birth: 1929/09/04  Today's Date: 01/11/2013 Time: 1000-1030 Time Calculation (min): 30 min  Skilled Therapeutic Intervention: Treatment focus on cognitive-linguistic goals. Pt utilized written expression to communicate wants/needs and required Min A verbal and question cues to self-monitor and correct spelling errors and increase overall functional communication. Pt was able to open/close oral cavity on command with 100% accuracy but was unable to protrude labial or lingual musculature despite multimodal cueing. Pt also sustained "ah" for ~3 seconds and was able to approximate "oh" for 1 second. Pt with increased management of secretions and demonstrate anterior spillage of saliva X 1.   Short Term Goals: Week 1: SLP Short Term Goal 1 (Week 1): Pt will demonstrate emergent awareness and will self-monitor secretions with Mod A question cues.  SLP Short Term Goal 1 - Progress (Week 1): Met SLP Short Term Goal 2 (Week 1): Pt will demonstrate increased AP transit with ice chip trials with Max A multimodal cueing SLP Short Term Goal 2 - Progress (Week 1): Not met SLP Short Term Goal 3 (Week 1): Pt will perform lingual and labial ROM exercises with Max A mutlimodal cueing SLP Short Term Goal 3 - Progress (Week 1): Not met SLP Short Term Goal 4 (Week 1): Pt will self-monitor and correct spelling errors with Mod A question cues.  SLP Short Term Goal 4 - Progress (Week 1): Met SLP Short Term Goal 5 (Week 1): Pt will demonstrate functional problem solving with supervision cues for mildly complex tasks SLP Short Term Goal 5 - Progress (Week 1): Not met  New Short Term Goals: Week 2: SLP Short Term Goal 1 (Week 2): Pt will demonstrate functional problem solving for functional and familiar tasks with supervision verbal cues.  SLP Short Term Goal 2 (Week 2): Pt will self-monitor and  correct errors during written expression to increase functional communication with supervision verbal cues.  SLP Short Term Goal 3 (Week 2): Pt will demonstrate increased management of secretions and self-monitor and correct anterior spillage with supervision question cues.  SLP Short Term Goal 4 (Week 2): Pt will utilize a Public affairs consultant with Mod A multimodal cueing SLP Short Term Goal 5 (Week 2): Pt will consume trials of thin liquids via a syringe without overt s/s of aspiration with 75% of trials with Min A multimodal cueing.  SLP Short Term Goal 6 (Week 2): Pt will inititate a swallow with 75% of opportunities with Min A multimodal cueing.   Weekly Progress Updates: Pt has met 2 of 5 goals this reporting period. Currently, pt is demonstrating increased management of secretions and requires Min A verbal and question cues to self-monitor and correct anterior spillage. Pt also has increased his ability to self-monitor and correct his errors during written expression to increase functional communication of wants/needs. Pt has been consuming trials of thin liquids via a syringe (1/2 tsp) with an intermittent timely swallow initiation and minimal overt s/s of aspiration. Pt continues to be nonverbal and demonstrates very limited volitional movement with his lingual and labial musculature. Pt would benefit from continued skilled SLP intervention to maximize functional communication and swallowing function for possible PO trials.    SLP Intensity: Minumum of 1-2 x/day, 30 to 90 minutes SLP Frequency: 5 out of 7 days SLP Duration/Estimated Length of Stay: TBD due to SNF placement SLP Treatment/Interventions: Cognitive remediation/compensation;Cueing hierarchy;Dysphagia/aspiration precaution training;Functional tasks;Internal/external aids;Environmental controls;Multimodal communication approach;Patient/family education;Therapeutic  Activities;Oral motor exercises;Speech/Language facilitation  Daily  Session FIM:  Comprehension Comprehension Mode: Auditory Comprehension: 4-Understands basic 75 - 89% of the time/requires cueing 10 - 24% of the time Expression Expression Mode: Nonverbal Expression: 4-Expresses basic 75 - 89% of the time/requires cueing 10 - 24% of the time. Needs helper to occlude trach/needs to repeat words. Social Interaction Social Interaction: 4-Interacts appropriately 75 - 89% of the time - Needs redirection for appropriate language or to initiate interaction. Problem Solving Problem Solving: 4-Solves basic 75 - 89% of the time/requires cueing 10 - 24% of the time Memory Memory: 4-Recognizes or recalls 75 - 89% of the time/requires cueing 10 - 24% of the time Pain Pain Assessment Pain Assessment: Faces Faces Pain Scale: Hurts little more Pain Location: Abdomen Pain Orientation: Left Pain Descriptors: Aching Pain Onset: On-going Patients Stated Pain Goal: 2 Pain Intervention(s): RN made aware  Therapy/Group: Individual Therapy  Vi Biddinger 01/11/2013, 10:57 AM

## 2013-01-11 NOTE — Progress Notes (Signed)
Physical Therapy Weekly Progress Note  Patient Details  Name: Johnathan Bowman MRN: [id number] Date of Birth: 1929-03-22  Today's Date: 01/11/2013  Patient has met 3 of 5 long term goals.  Short term goals not set due to estimated length of stay.  Pt is currently supervision with supervision and mobility, upgraded transfers and bed mobility goal to mod I.  Patient continues to demonstrate the following deficits: impaired activity tolerance, balance, gait and therefore will continue to benefit from skilled PT intervention to enhance overall performance with activity tolerance, balance and ability to compensate for deficits.  See Patient's Care Plan for progression toward long term goals.  Patient progressing toward long term goals..  Plan of care revisions: upgraded transfer goal to modI.   See FIM for current functional status   Johnathan Bowman 01/11/2013, 7:49 AM

## 2013-01-11 NOTE — Progress Notes (Signed)
Subjective/Complaints: Says back pain a little worse today. Still using suction for secretions A 12 point review of systems has been performed and if not noted above is otherwise negative.   Objective: Vital Signs: Blood pressure 156/75, pulse 90, temperature 97.8 F (36.6 C), temperature source Oral, resp. rate 17, height 5\' 5"  (1.651 m), weight 80.7 kg (177 lb 14.6 oz), SpO2 95.00%. No results found. No results found for this basename: WBC:2,HGB:2,HCT:2,PLT:2 in the last 72 hours  Basename 01/09/13 0550  NA --  K --  CL --  GLUCOSE --  BUN --  CREATININE 0.81  CALCIUM --   CBG (last 3)   Basename 01/11/13 0731 01/11/13 0605 01/10/13 2219  GLUCAP 178* 138* 195*    Wt Readings from Last 3 Encounters:  01/10/13 80.7 kg (177 lb 14.6 oz)  12/27/12 76.8 kg (169 lb 5 oz)  10/24/12 83.961 kg (185 lb 1.6 oz)    Physical Exam:  Constitutional: He appears well-developed and well-nourished.  No distress.   HENT:  Head: Normocephalic and atraumatic.  Eyes: Pupils are equal, round, and reactive to light.  Neck: Normal range of motion.  Cardiovascular: Normal rate and regular rhythm. No murmurs auscultated  Pulmonary/Chest: Effort normal.   rhonchi generally decreased. No focal rales auscultated.  Abdominal: Soft. Bowel sounds are normal. There is tenderness (around PEG site.). There is no rebound and no guarding. He is slightly distended Musculoskeletal: He exhibits no edema. Has tenderness with palpation over the left low lumbar spine and flank into the iliac crest area. Neurological: He is very alert.   speech  garbled.--minimal word formation.  Weak cough.head nods to y/n questions. Can communicate thru dry erase board.  right facial weakness and poor oropharyngeal control. Still Can only minimally protrude tongue.  Weak cough. Follows basic commands without difficulty and moves all four but displays pronator drift on the RUE and decreased FMC as a whole on the right side.  Strength 4 to 4+ right upper and lower and 4+ on the left. He has pain sense on all 4 limbs Skin: Skin is warm and dry.    Assessment/Plan: 1. Functional deficits secondary to thrombotic left MCA and PCA infarcts which require 3+ hours per day of interdisciplinary therapy in a comprehensive inpatient rehab setting. Physiatrist is providing close team supervision and 24 hour management of active medical problems listed below. Physiatrist and rehab team continue to assess barriers to discharge/monitor patient progress toward functional and medical goals. FIM: FIM - Bathing Bathing Steps Patient Completed: Chest;Right Arm;Left Arm;Abdomen;Front perineal area;Buttocks;Left upper leg;Right upper leg;Left lower leg (including foot);Right lower leg (including foot) Bathing: 4: Steadying assist  FIM - Upper Body Dressing/Undressing Upper body dressing/undressing steps patient completed: Thread/unthread right sleeve of pullover shirt/dresss;Thread/unthread left sleeve of pullover shirt/dress;Put head through opening of pull over shirt/dress;Pull shirt over trunk Upper body dressing/undressing: 5: Set-up assist to: Obtain clothing/put away FIM - Lower Body Dressing/Undressing Lower body dressing/undressing steps patient completed: Thread/unthread right underwear leg;Thread/unthread left underwear leg;Pull underwear up/down;Thread/unthread right pants leg;Thread/unthread left pants leg;Pull pants up/down;Don/Doff right sock;Don/Doff left sock Lower body dressing/undressing: 5: Set-up assist to: Don/Doff TED stocking  FIM - Toileting Toileting steps completed by patient: Adjust clothing prior to toileting;Performs perineal hygiene;Adjust clothing after toileting Toileting: 5: Supervision: Safety issues/verbal cues  FIM - Diplomatic Services operational officer Devices: Grab bars;Walker Toilet Transfers: 4-To toilet/BSC: Min A (steadying Pt. > 75%);4-From toilet/BSC: Min A (steadying Pt. >  75%)  FIM - Banker  Devices: Bed rails Bed/Chair Transfer: 5: Bed > Chair or W/C: Supervision (verbal cues/safety issues);5: Chair or W/C > Bed: Supervision (verbal cues/safety issues)  FIM - Locomotion: Wheelchair Distance: 40' Locomotion: Wheelchair: 0: Activity did not occur FIM - Locomotion: Ambulation Locomotion: Ambulation Assistive Devices: Designer, industrial/product Ambulation/Gait Assistance: 4: Min assist Locomotion: Ambulation: 5: Travels 150 ft or more with supervision/safety issues  Comprehension Comprehension Mode: Auditory Comprehension: 4-Understands basic 75 - 89% of the time/requires cueing 10 - 24% of the time  Expression Expression Mode: Nonverbal Expression: 4-Expresses basic 75 - 89% of the time/requires cueing 10 - 24% of the time. Needs helper to occlude trach/needs to repeat words.  Social Interaction Social Interaction: 4-Interacts appropriately 75 - 89% of the time - Needs redirection for appropriate language or to initiate interaction.  Problem Solving Problem Solving: 4-Solves basic 75 - 89% of the time/requires cueing 10 - 24% of the time  Memory Memory: 4-Recognizes or recalls 75 - 89% of the time/requires cueing 10 - 24% of the time  Medical Problem List and Plan:  1. DVT Prophylaxis/Anticoagulation: Pharmaceutical: Lovenox  2. Chronic pain: has done well wotj fentanyl patch in addition  to his prn hydrocodone. Consider increasing to patch depending on pain levels. He doesn't appear to be in severe pain at this point. 3. Mood: see below.  4. Neuropsych: This patient is not capable of making decisions on his/her own behalf.  5. DM type 2: increased AM lantus. continue PM dose . ssi coverage also.--follow for trend 6. Stage IV Lung cancer: Use oxygen at home per wife. Will resume. Use nebs as needed.  7. HTN: checking BP on bid basis. On lotrel PTA,resumed amlodipine 8. Dysphagia:  -minimal  improvement  -continue CONTINUOUS TF  -major aspiration risk--HOB at 30 degrees or greater, reviewed with daughter  -follow closely for temps, labs, etc--remains afebrile  -bedside suction as needed.  -encouraged IS 9. Signs of reactive depression- begin low dose celexa 10mg  qhs  LOS (Days) 9 A FACE TO FACE EVALUATION WAS PERFORMED  Joeph Szatkowski T 01/11/2013 8:02 AM

## 2013-01-11 NOTE — Progress Notes (Signed)
This note has been reviewed and this clinician agrees with information provided.  

## 2013-01-11 NOTE — Progress Notes (Signed)
Physical Therapy Note  Patient Details  Name: Johnathan Bowman MRN: [id number] Date of Birth: [birth year] Today's Date: 01/11/2013  Time: 1030-1100 30 minutes  Pt c/o 2/10 pain in ribs/sternum, RN made aware.  Treatment focused on dynamic balance training without AD.  Standing ball toss on rebounder with pt close supervision for tosses within BOS, min-mod A for reaching and bending dynamic movements out of BOS.  Static standing balance with UE and LE movements with close supervision.  Pt supervision with gait around unit > 150' with RW.  Individual therapy    DONAWERTH,KAREN 01/11/2013, 10:58 AM

## 2013-01-12 ENCOUNTER — Inpatient Hospital Stay (HOSPITAL_COMMUNITY): Payer: Medicare Other | Admitting: Physical Therapy

## 2013-01-12 LAB — GLUCOSE, CAPILLARY: Glucose-Capillary: 194 mg/dL — ABNORMAL HIGH (ref 70–99)

## 2013-01-12 NOTE — Progress Notes (Signed)
Physical Therapy Session Note  Patient Details  Name: KELLEN DUTCH MRN: [id number] Date of Birth: 1929/03/20  Today's Date: 01/12/2013 Time: 5621-3086 Time Calculation (min): 55 min  Skilled Therapeutic Interventions/Progress Updates:    Gait on unit with RW and min@ x 120' x 2.  O2 sats decreased with walk from 93-86%.  HR=56.  NuStep for UE and LE strengthening HR increased to 114, pt instructed to stop and rest. O2=96.  HR remained around 95 rest of treatment, per chart pt is sometime at this level.  Pt was not symptomatic.  Dynamic standing to play horseshoes with close supervision, pt standing beside RW to play.  Therapy Documentation Precautions:  Precautions Precautions: Fall Precaution Comments: NPO, PEG tube Restrictions Weight Bearing Restrictions: No Pain:  No pain See FIM for current functional status  Therapy/Group: Individual Therapy  Georges Mouse 01/12/2013, 3:20 PM

## 2013-01-12 NOTE — Progress Notes (Signed)
Subjective/Complaints: No complaints. Rested well. Pain is controlled A 12 point review of systems has been performed and if not noted above is otherwise negative.   Objective: Vital Signs: Blood pressure 147/82, pulse 86, temperature 98.4 F (36.9 C), temperature source Oral, resp. rate 18, height 5\' 5"  (1.651 m), weight 82.4 kg (181 lb 10.5 oz), SpO2 91.00%. No results found. No results found for this basename: WBC:2,HGB:2,HCT:2,PLT:2 in the last 72 hours No results found for this basename: NA:2,K:2,CL:2,CO:2,GLUCOSE:2,BUN:2,CREATININE:2,CALCIUM:2 in the last 72 hours CBG (last 3)   Basename 01/12/13 0611 01/11/13 2117 01/11/13 1826  GLUCAP 108* 185* 116*    Wt Readings from Last 3 Encounters:  01/12/13 82.4 kg (181 lb 10.5 oz)  12/27/12 76.8 kg (169 lb 5 oz)  10/24/12 83.961 kg (185 lb 1.6 oz)    Physical Exam:  Constitutional: He appears well-developed and well-nourished.  No distress.   HENT:  Head: Normocephalic and atraumatic.  Eyes: Pupils are equal, round, and reactive to light.  Neck: Normal range of motion.  Cardiovascular: Normal rate and regular rhythm. No murmurs auscultated  Pulmonary/Chest: Effort normal.   rhonchi generally decreased. No focal rales auscultated.  Abdominal: Soft. Bowel sounds are normal. There is tenderness (around PEG site.). There is no rebound and no guarding. He is slightly distended Musculoskeletal: He exhibits no edema. Has tenderness with palpation over the left low lumbar spine and flank into the iliac crest area. Neurological: He is very alert.   speech  garbled.--minimal word formation., aphonic Weak cough.head nods to y/n questions. Can communicate thru dry erase board.  right facial weakness and poor oropharyngeal control. Still Can only minimally protrude tongue.  Weak cough. Follows basic commands without difficulty and moves all four but displays pronator drift on the RUE and decreased FMC as a whole on the right side. Strength 4  to 4+ right upper and lower and 4+ on the left. He has pain sense on all 4 limbs Skin: Skin is warm and dry.    Assessment/Plan: 1. Functional deficits secondary to thrombotic left MCA and PCA infarcts which require 3+ hours per day of interdisciplinary therapy in a comprehensive inpatient rehab setting. Physiatrist is providing close team supervision and 24 hour management of active medical problems listed below. Physiatrist and rehab team continue to assess barriers to discharge/monitor patient progress toward functional and medical goals. FIM: FIM - Bathing Bathing Steps Patient Completed: Chest;Right Arm;Left Arm;Abdomen;Front perineal area;Buttocks;Right upper leg;Left upper leg;Right lower leg (including foot);Left lower leg (including foot) Bathing: 4: Steadying assist  FIM - Upper Body Dressing/Undressing Upper body dressing/undressing steps patient completed: Thread/unthread right sleeve of pullover shirt/dresss;Thread/unthread left sleeve of pullover shirt/dress;Put head through opening of pull over shirt/dress;Pull shirt over trunk Upper body dressing/undressing: 5: Set-up assist to: Obtain clothing/put away FIM - Lower Body Dressing/Undressing Lower body dressing/undressing steps patient completed: Thread/unthread right underwear leg;Pull underwear up/down;Thread/unthread right pants leg;Thread/unthread left pants leg;Pull pants up/down Lower body dressing/undressing: 5: Set-up assist to: Obtain clothing  FIM - Toileting Toileting steps completed by patient: Adjust clothing prior to toileting;Performs perineal hygiene;Adjust clothing after toileting Toileting Assistive Devices: Grab bar or rail for support Toileting: 5: Supervision: Safety issues/verbal cues  FIM - Diplomatic Services operational officer Devices: Grab bars;Walker Toilet Transfers: 4-To toilet/BSC: Min A (steadying Pt. > 75%);4-From toilet/BSC: Min A (steadying Pt. > 75%)  FIM - Bed/Chair  Transfer Bed/Chair Transfer Assistive Devices: Therapist, occupational: 5: Bed > Chair or W/C: Supervision (verbal cues/safety issues);5: Chair or W/C > Bed:  Supervision (verbal cues/safety issues)  FIM - Locomotion: Wheelchair Distance: 40' Locomotion: Wheelchair: 0: Activity did not occur FIM - Locomotion: Ambulation Locomotion: Ambulation Assistive Devices: Designer, industrial/product Ambulation/Gait Assistance: 4: Min assist Locomotion: Ambulation: 5: Travels 150 ft or more with supervision/safety issues  Comprehension Comprehension Mode: Auditory Comprehension: 4-Understands basic 75 - 89% of the time/requires cueing 10 - 24% of the time  Expression Expression Mode: Verbal Expression Assistive Devices: 6-Communication board Expression: 4-Expresses basic 75 - 89% of the time/requires cueing 10 - 24% of the time. Needs helper to occlude trach/needs to repeat words.  Social Interaction Social Interaction: 4-Interacts appropriately 75 - 89% of the time - Needs redirection for appropriate language or to initiate interaction.  Problem Solving Problem Solving: 4-Solves basic 75 - 89% of the time/requires cueing 10 - 24% of the time  Memory Memory: 4-Recognizes or recalls 75 - 89% of the time/requires cueing 10 - 24% of the time  Medical Problem List and Plan:  1. DVT Prophylaxis/Anticoagulation: Pharmaceutical: Lovenox  2. Chronic pain: has done well with fentanyl patch in addition  to his prn hydrocodone. Consider increasing to patch depending on pain levels. He is doing well today 3. Mood: see below.  4. Neuropsych: This patient is not capable of making decisions on his/her own behalf.  5. DM type 2: increased AM lantus. continue PM dose . ssi coverage also.--follow for trend 6. Stage IV Lung cancer: Use oxygen at home per wife. Will resume. Use nebs as needed.  7. HTN: checking BP on bid basis. On lotrel PTA,resumed amlodipine 8. Dysphagia:  -minimal improvement  -continue  CONTINUOUS TF  -major aspiration risk--HOB at 30 degrees or greater, reviewed with daughter  -follow closely for temps, labs, etc--remains afebrile  -bedside suction as needed.  -encouraged IS 9. Signs of reactive depression- begin low dose celexa 10mg  qhs  LOS (Days) 10 A FACE TO FACE EVALUATION WAS PERFORMED  SWARTZ,ZACHARY T 01/12/2013 8:04 AM

## 2013-01-13 ENCOUNTER — Inpatient Hospital Stay (HOSPITAL_COMMUNITY): Payer: Medicare Other | Admitting: *Deleted

## 2013-01-13 LAB — GLUCOSE, CAPILLARY: Glucose-Capillary: 137 mg/dL — ABNORMAL HIGH (ref 70–99)

## 2013-01-13 NOTE — Progress Notes (Signed)
Subjective/Complaints: No complaints. Rested well. Pain is controlled A 12 point review of systems has been performed and if not noted above is otherwise negative.   Objective: Vital Signs: Blood pressure 144/66, pulse 81, temperature 98.3 F (36.8 C), temperature source Oral, resp. rate 20, height 5\' 5"  (1.651 m), weight 79.4 kg (175 lb 0.7 oz), SpO2 94.00%. No results found. No results found for this basename: WBC:2,HGB:2,HCT:2,PLT:2 in the last 72 hours No results found for this basename: NA:2,K:2,CL:2,CO:2,GLUCOSE:2,BUN:2,CREATININE:2,CALCIUM:2 in the last 72 hours CBG (last 3)   Basename 01/13/13 0558 01/12/13 2214 01/12/13 1832  GLUCAP 137* 194* 120*    Wt Readings from Last 3 Encounters:  01/13/13 79.4 kg (175 lb 0.7 oz)  12/27/12 76.8 kg (169 lb 5 oz)  10/24/12 83.961 kg (185 lb 1.6 oz)    Physical Exam:  Constitutional: He appears well-developed and well-nourished.  No distress.   HENT:  Head: Normocephalic and atraumatic.  Eyes: Pupils are equal, round, and reactive to light.  Neck: Normal range of motion.  Cardiovascular: Normal rate and regular rhythm. No murmurs auscultated  Pulmonary/Chest: Effort normal.   rhonchi generally decreased. No focal rales auscultated.  Abdominal: Soft. Bowel sounds are normal. There is tenderness (around PEG site.). There is no rebound and no guarding. He is slightly distended Musculoskeletal: He exhibits no edema. Has tenderness with palpation over the left low lumbar spine and flank into the iliac crest area. Neurological: He is very alert.   speech  garbled.--minimal word formation., aphonic Weak cough.head nods to y/n questions. Can communicate thru dry erase board.  right facial weakness and poor oropharyngeal control. Still Can only minimally protrude tongue.  Weak cough. Follows basic commands without difficulty and moves all four but displays pronator drift on the RUE and decreased FMC as a whole on the right side. Strength 4 to  4+ right upper and lower and 4+ on the left. He has pain sense on all 4 limbs Skin: Skin is warm and dry.    Assessment/Plan: 1. Functional deficits secondary to thrombotic left MCA and PCA infarcts which require 3+ hours per day of interdisciplinary therapy in a comprehensive inpatient rehab setting. Physiatrist is providing close team supervision and 24 hour management of active medical problems listed below. Physiatrist and rehab team continue to assess barriers to discharge/monitor patient progress toward functional and medical goals. FIM: FIM - Bathing Bathing Steps Patient Completed: Chest;Right Arm;Left Arm;Abdomen;Front perineal area;Right upper leg;Left upper leg;Right lower leg (including foot);Left lower leg (including foot) Bathing: 4: Steadying assist  FIM - Upper Body Dressing/Undressing Upper body dressing/undressing steps patient completed: Thread/unthread right sleeve of pullover shirt/dresss;Thread/unthread left sleeve of pullover shirt/dress;Put head through opening of pull over shirt/dress;Pull shirt over trunk Upper body dressing/undressing: 5: Set-up assist to: Obtain clothing/put away FIM - Lower Body Dressing/Undressing Lower body dressing/undressing steps patient completed: Thread/unthread left pants leg;Pull pants up/down Lower body dressing/undressing: 1: Two helpers  FIM - Toileting Toileting steps completed by patient: Adjust clothing prior to toileting;Performs perineal hygiene;Adjust clothing after toileting Toileting Assistive Devices: Grab bar or rail for support Toileting: 5: Supervision: Safety issues/verbal cues  FIM - Diplomatic Services operational officer Devices: Grab bars;Walker Toilet Transfers: 4-To toilet/BSC: Min A (steadying Pt. > 75%);4-From toilet/BSC: Min A (steadying Pt. > 75%)  FIM - Bed/Chair Transfer Bed/Chair Transfer Assistive Devices: Arm rests Bed/Chair Transfer: 4: Chair or W/C > Bed: Min A (steadying Pt. > 75%)  FIM -  Locomotion: Wheelchair Distance: 40' Locomotion: Wheelchair: 1: Total Assistance/staff pushes  wheelchair (Pt<25%) FIM - Locomotion: Ambulation Locomotion: Ambulation Assistive Devices: Walker - Rolling Ambulation/Gait Assistance: 4: Min assist Locomotion: Ambulation: 2: Travels 50 - 149 ft with minimal assistance (Pt.>75%)  Comprehension Comprehension Mode: Auditory Comprehension: 4-Understands basic 75 - 89% of the time/requires cueing 10 - 24% of the time  Expression Expression Mode: Nonverbal Expression Assistive Devices: 6-Communication board Expression: 5-Set-up assist with assistive device  Social Interaction Social Interaction: 4-Interacts appropriately 75 - 89% of the time - Needs redirection for appropriate language or to initiate interaction.  Problem Solving Problem Solving: 4-Solves basic 75 - 89% of the time/requires cueing 10 - 24% of the time  Memory Memory: 4-Recognizes or recalls 75 - 89% of the time/requires cueing 10 - 24% of the time  Medical Problem List and Plan:  1. DVT Prophylaxis/Anticoagulation: Pharmaceutical: Lovenox  2. Chronic pain: has done well with fentanyl patch in addition  to his prn hydrocodone. Consider increasing to patch depending on pain levels. His pain has been improved overall 3. Mood: see below.  4. Neuropsych: This patient is not capable of making decisions on his/her own behalf.  5. DM type 2: increased AM lantus. continue PM dose . ssi coverage also.--follow for trend 6. Stage IV Lung cancer: Use oxygen at home per wife. . Use nebs as needed 7. HTN: checking BP on bid basis. On lotrel PTA,resumed amlodipine 8. Dysphagia:  -minimal improvement  -continue CONTINUOUS TF  -major aspiration risk--HOB at 30 degrees or greater, reviewed with daughter  -follow closely for temps, labs, etc--remains afebrile  -bedside suction as needed.  -encouraged IS 9. Signs of reactive depression- begin low dose celexa 10mg  qhs  LOS  (Days) 11 A FACE TO FACE EVALUATION WAS PERFORMED  SWARTZ,ZACHARY T 01/13/2013 7:49 AM

## 2013-01-13 NOTE — Progress Notes (Signed)
Occupational Therapy Note  Patient Details  Name: Johnathan Bowman MRN: [id number] Date of Birth: Jun 13, 1929 Today's Date: 01/13/2013  Time:  1914-7829  (45 min) Individual session Pain:  none  Therapeutic car retraining at sink level (pt's choice). Pt. Ambulated to obtain clothes with RW and supervision for balance.  Addressed standing tolerance, standing balance, endurance.  Pt. On 2 liter oxygen and sats were around 88 when engaged in movement.  Pt. Stood to don shirt for 2 minutes before sitting down.  No LOB during activity.   Pt. Indicated his nose was stopped up but no other issues or pain noted.      Humberto Seals 01/13/2013, 7:42 AM

## 2013-01-14 ENCOUNTER — Inpatient Hospital Stay (HOSPITAL_COMMUNITY): Payer: Medicare Other | Admitting: Speech Pathology

## 2013-01-14 ENCOUNTER — Encounter (HOSPITAL_COMMUNITY): Payer: Medicare Other | Admitting: Occupational Therapy

## 2013-01-14 ENCOUNTER — Inpatient Hospital Stay (HOSPITAL_COMMUNITY): Payer: Medicare Other | Admitting: Physical Therapy

## 2013-01-14 DIAGNOSIS — I6330 Cerebral infarction due to thrombosis of unspecified cerebral artery: Secondary | ICD-10-CM

## 2013-01-14 DIAGNOSIS — E1165 Type 2 diabetes mellitus with hyperglycemia: Secondary | ICD-10-CM

## 2013-01-14 DIAGNOSIS — I69991 Dysphagia following unspecified cerebrovascular disease: Secondary | ICD-10-CM

## 2013-01-14 DIAGNOSIS — I10 Essential (primary) hypertension: Secondary | ICD-10-CM

## 2013-01-14 DIAGNOSIS — C3490 Malignant neoplasm of unspecified part of unspecified bronchus or lung: Secondary | ICD-10-CM

## 2013-01-14 LAB — GLUCOSE, CAPILLARY
Glucose-Capillary: 126 mg/dL — ABNORMAL HIGH (ref 70–99)
Glucose-Capillary: 219 mg/dL — ABNORMAL HIGH (ref 70–99)
Glucose-Capillary: 214 mg/dL — ABNORMAL HIGH (ref 70–99)

## 2013-01-14 MED ORDER — WHITE PETROLATUM GEL
Status: AC
  Administered 2013-01-14: 0.2
  Filled 2013-01-14: qty 5

## 2013-01-14 NOTE — Progress Notes (Signed)
Occupational Therapy Session Note  Patient Details  Name: OTNIEL HOE MRN: [id number] Date of Birth: May 14, 1929  Today's Date: 01/14/2013 Time: 8413-2440 60 minutes  Short Term Goals: Week 1:  OT Short Term Goal 1 (Week 1): STG=LTG  Skilled Therapeutic Interventions/Progress Updates:  Pt. Seen in room for self care training with a focus on activity tolerance and dynamic standing balance. A & Ox4. Pt.  functionally ambulated to BR using RW with supervision and completed showering, grooming, and dressing at shower level with Supervision (required minimal assist with LLE  Due to some abdominal discomfort with bending). Pt. demonstrated increased lip closure through out session with functional activities. Supervised functionally ambulation to dayroom to visit with his family.    Therapy Documentation Precautions:  Precautions Precautions: Fall Precaution Comments: NPO, PEG tube Restrictions Weight Bearing Restrictions: No Pain: Pain Assessment Pain Assessment: 0-10 Pain Score:   2 Pain Type: Chronic pain Pain Location: Back Pain Orientation: Left;Lower;Medial Pain Descriptors: Aching Pain Onset: On-going Patients Stated Pain Goal: 2 Pain Intervention(s): Medication (See eMAR);Repositioned;Heat applied Multiple Pain Sites: No  See FIM for current functional status  Therapy/Group: Individual Therapy  Chanequa Spees OTA/S Occupational Therapy Assistant Student 01/14/2013, 10:35 AM

## 2013-01-14 NOTE — Progress Notes (Signed)
Speech Language Pathology Daily Session Note  Patient Details  Name: Johnathan Bowman MRN: [id number] Date of Birth: [birth year]  Today's Date: 01/14/2013 Time: 1130-1200 Time Calculation (min): 30 min  Short Term Goals: Week 2: SLP Short Term Goal 1 (Week 2): Pt will demonstrate functional problem solving for functional and familiar tasks with supervision verbal cues.  SLP Short Term Goal 2 (Week 2): Pt will self-monitor and correct errors during written expression to increase functional communication with supervision verbal cues.  SLP Short Term Goal 3 (Week 2): Pt will demonstrate increased management of secretions and self-monitor and correct anterior spillage with supervision question cues.  SLP Short Term Goal 4 (Week 2): Pt will utilize a Public affairs consultant with Mod A multimodal cueing SLP Short Term Goal 5 (Week 2): Pt will consume trials of thin liquids via a syringe without overt s/s of aspiration with 75% of trials with Min A multimodal cueing.  SLP Short Term Goal 6 (Week 2): Pt will inititate a swallow with 75% of opportunities with Min A multimodal cueing.   Skilled Therapeutic Interventions: Treatment focus on language goals. SLP facilitated session by providing larger pictures of functional objects/emotions to utilize on a communication board. Pt identified the appropriate picture with 100% accuracy from a field of 6. Pt required supervision verbal cues to self-monitor anterior spillage of saliva throughout the task. Pt with increased intensity during vocalizations and approximations of "ah" and "m."    FIM:  Comprehension Comprehension Mode: Auditory Comprehension: 5-Understands basic 90% of the time/requires cueing < 10% of the time Expression Expression Mode: Verbal Expression: 4-Expresses basic 75 - 89% of the time/requires cueing 10 - 24% of the time. Needs helper to occlude trach/needs to repeat words. Social Interaction Social Interaction: 5-Interacts appropriately  90% of the time - Needs monitoring or encouragement for participation or interaction. Problem Solving Problem Solving: 5-Solves basic 90% of the time/requires cueing < 10% of the time Memory Memory: 4-Recognizes or recalls 75 - 89% of the time/requires cueing 10 - 24% of the time FIM - Eating Eating Activity: 0: Activity did not occur  Pain Pain Assessment Pain Assessment: No/denies pain   Therapy/Group: Individual Therapy  Lido Maske 01/14/2013, 12:21 PM

## 2013-01-14 NOTE — Progress Notes (Signed)
This note has been reviewed and this clinician agrees with information provided.  

## 2013-01-14 NOTE — Progress Notes (Signed)
Subjective/Complaints: Fairly uneventful weekend. Pain improved. Afebrile, no cough A 12 point review of systems has been performed and if not noted above is otherwise negative.   Objective: Vital Signs: Blood pressure 148/85, pulse 99, temperature 98.3 F (36.8 C), temperature source Oral, resp. rate 20, height 5\' 5"  (1.651 m), weight 75.1 kg (165 lb 9.1 oz), SpO2 93.00%. No results found. No results found for this basename: WBC:2,HGB:2,HCT:2,PLT:2 in the last 72 hours No results found for this basename: NA:2,K:2,CL:2,CO:2,GLUCOSE:2,BUN:2,CREATININE:2,CALCIUM:2 in the last 72 hours CBG (last 3)   Basename 01/14/13 0717 01/14/13 0623 01/13/13 2039  GLUCAP 219* 126* 208*    Wt Readings from Last 3 Encounters:  01/14/13 75.1 kg (165 lb 9.1 oz)  12/27/12 76.8 kg (169 lb 5 oz)  10/24/12 83.961 kg (185 lb 1.6 oz)    Physical Exam:  Constitutional: He appears well-developed and well-nourished.  No distress.   HENT:  Head: Normocephalic and atraumatic.  Eyes: Pupils are equal, round, and reactive to light.  Neck: Normal range of motion.  Cardiovascular: Normal rate and regular rhythm. No murmurs auscultated  Pulmonary/Chest: Effort normal.   rhonchi generally decreased. No focal rales auscultated.  Abdominal: Soft. Bowel sounds are normal. There is tenderness (around PEG site.). There is no rebound and no guarding. He is slightly distended Musculoskeletal: He exhibits no edema. Has tenderness with palpation over the left low lumbar spine and flank into the iliac crest area. Neurological: He is very alert.   speech  garbled.--minimal word formation., aphonic Weak cough still. Can communicate thru dry erase board.  right facial weakness and poor oropharyngeal control. Still Can only minimally protrude tongue.  Weak cough. Follows basic commands without difficulty and moves all four but displays pronator drift on the RUE and decreased FMC as a whole on the right side. Strength 4 to 4+  right upper and lower and 4+ on the left. He has pain sense on all 4 limbs Skin: Skin is warm and dry.    Assessment/Plan: 1. Functional deficits secondary to thrombotic left MCA and PCA infarcts which require 3+ hours per day of interdisciplinary therapy in a comprehensive inpatient rehab setting. Physiatrist is providing close team supervision and 24 hour management of active medical problems listed below. Physiatrist and rehab team continue to assess barriers to discharge/monitor patient progress toward functional and medical goals. FIM: FIM - Bathing Bathing Steps Patient Completed: Chest;Right Arm;Left Arm;Abdomen;Front perineal area;Right upper leg;Left upper leg;Right lower leg (including foot);Left lower leg (including foot) Bathing: 4: Steadying assist  FIM - Upper Body Dressing/Undressing Upper body dressing/undressing steps patient completed: Thread/unthread right sleeve of pullover shirt/dresss;Thread/unthread left sleeve of pullover shirt/dress;Put head through opening of pull over shirt/dress;Pull shirt over trunk Upper body dressing/undressing: 5: Supervision: Safety issues/verbal cues FIM - Lower Body Dressing/Undressing Lower body dressing/undressing steps patient completed: Thread/unthread right pants leg;Thread/unthread left pants leg;Pull pants up/down Lower body dressing/undressing: 4: Min-Patient completed 75 plus % of tasks  FIM - Toileting Toileting steps completed by patient: Adjust clothing prior to toileting;Performs perineal hygiene;Adjust clothing after toileting Toileting Assistive Devices: Grab bar or rail for support Toileting: 5: Supervision: Safety issues/verbal cues  FIM - Diplomatic Services operational officer Devices: Grab bars;Walker Event organiser: 0-Activity did not occur  FIM - Banker Devices: Bed rails Bed/Chair Transfer: 5: Chair or W/C > Bed: Supervision (verbal cues/safety issues)  FIM -  Locomotion: Wheelchair Distance: 40' Locomotion: Wheelchair: 1: Total Assistance/staff pushes wheelchair (Pt<25%) FIM - Locomotion: Ambulation Locomotion: Health visitor  Devices: Designer, industrial/product Ambulation/Gait Assistance: 4: Min assist Locomotion: Ambulation: 1: Travels less than 50 ft with total assistance/helper does all (Pt.<25%)  Comprehension Comprehension Mode: Auditory Comprehension: 5-Understands basic 90% of the time/requires cueing < 10% of the time  Expression Expression Mode: Verbal Expression Assistive Devices: 6-Communication board Expression: 5-Expresses basic needs/ideas: With no assist  Social Interaction Social Interaction: 4-Interacts appropriately 75 - 89% of the time - Needs redirection for appropriate language or to initiate interaction.  Problem Solving Problem Solving: 5-Solves basic 90% of the time/requires cueing < 10% of the time  Memory Memory: 4-Recognizes or recalls 75 - 89% of the time/requires cueing 10 - 24% of the time  Medical Problem List and Plan:  1. DVT Prophylaxis/Anticoagulation: Pharmaceutical: Lovenox  2. Chronic pain: has done well with fentanyl patch in addition  to his prn hydrocodone. Consider increasing to patch depending on pain levels. His pain has been improved overall 3. Mood: see below.  4. Neuropsych: This patient is not capable of making decisions on his/her own behalf.  5. DM type 2: increased AM lantus. continue PM dose . ssi coverage also.--follow for further trend. 6. Stage IV Lung cancer: Use oxygen at home per wife. . Use nebs as needed 7. HTN: checking BP on bid basis. On lotrel PTA,resumed amlodipine 8. Dysphagia:  -minimal improvement  -continue CONTINUOUS TF  -major aspiration risk--HOB at 30 degrees or greater, reviewed with daughter  -follow closely for temps, labs, etc--remains afebrile  -bedside suction as needed.  -encouraged IS 9. Signs of reactive depression- begin low dose celexa 10mg   qhs  LOS (Days) 12 A FACE TO FACE EVALUATION WAS PERFORMED  Johnathan Bowman T 01/14/2013 7:41 AM

## 2013-01-14 NOTE — Progress Notes (Signed)
Physical Therapy Note  Patient Details  Name: KHAMRON GELLERT MRN: [id number] Date of Birth: 26-Sep-1929 Today's Date: 01/14/2013  Time: 1000-1030 30 minutes  No c/o pain.  Gait throughout unit in controlled and household environments with supervision with RW.  Stair training x 1 flight with supervision.  Therex for LE strength and activity tolerance step ups, tap ups, standing hip abd, march, HS curls, heel raises, mini squats.  Pt requires frequent rests but willing to participate.  Individual therapy   Nakisha Chai 01/14/2013, 10:54 AM

## 2013-01-14 NOTE — Progress Notes (Signed)
Inpatient Diabetes Program Recommendations  AACE/ADA: New Consensus Statement on Inpatient Glycemic Control (2013)  Target Ranges:  Prepandial:   less than 140 mg/dL      Peak postprandial:   less than 180 mg/dL (1-2 hours)      Critically ill patients:  140 - 180 mg/dL   Reason for Visit: CBG's bouncing from 100's to 200's and back down again.  Inpatient Diabetes Program Recommendations Correction (SSI): Please change corrction scale to be given at the same time the bolus feeds are given: 0600, 1100, 1400, 1800, 2200 (rather than q 6 hrs which doesn't coincide with tube feed coverage.) Insulin - Meal Coverage: [phone]  Note: This would alleviate need to check pt's glucose at midnight and would correct before the tube feed is given. Then if tube feed coverage is needed, we can assess how much is needed from amount of correction needed.  Thank you, Lenor Coffin, RN, CNS, Diabetes Coordinator [phone])

## 2013-01-15 ENCOUNTER — Inpatient Hospital Stay (HOSPITAL_COMMUNITY): Payer: Medicare Other | Admitting: Physical Therapy

## 2013-01-15 ENCOUNTER — Inpatient Hospital Stay (HOSPITAL_COMMUNITY): Payer: Medicare Other | Admitting: Speech Pathology

## 2013-01-15 ENCOUNTER — Inpatient Hospital Stay (HOSPITAL_COMMUNITY): Payer: Medicare Other | Admitting: Occupational Therapy

## 2013-01-15 LAB — GLUCOSE, CAPILLARY
Glucose-Capillary: 311 mg/dL — ABNORMAL HIGH (ref 70–99)
Glucose-Capillary: 187 mg/dL — ABNORMAL HIGH (ref 70–99)

## 2013-01-15 MED ORDER — ALUM & MAG HYDROXIDE-SIMETH 200-200-20 MG/5ML PO SUSP
30.0000 mL | Freq: Three times a day (TID) | ORAL | Status: DC
  Administered 2013-01-15 – 2013-01-16 (×3): 30 mL
  Filled 2013-01-15 (×6): qty 30

## 2013-01-15 MED ORDER — INSULIN GLARGINE 100 UNIT/ML ~~LOC~~ SOLN
15.0000 [IU] | Freq: Every day | SUBCUTANEOUS | Status: DC
  Administered 2013-01-15: 15 [IU] via SUBCUTANEOUS

## 2013-01-15 NOTE — Progress Notes (Signed)
Speech Language Pathology Daily Session Note  Patient Details  Name: Johnathan Bowman MRN: [id number] Date of Birth: [birth year]  Today's Date: 01/15/2013 Time: 4098-1191 Time Calculation (min): 40 min  Short Term Goals: Week 2: SLP Short Term Goal 1 (Week 2): Pt will demonstrate functional problem solving for functional and familiar tasks with supervision verbal cues.  SLP Short Term Goal 2 (Week 2): Pt will self-monitor and correct errors during written expression to increase functional communication with supervision verbal cues.  SLP Short Term Goal 3 (Week 2): Pt will demonstrate increased management of secretions and self-monitor and correct anterior spillage with supervision question cues.  SLP Short Term Goal 4 (Week 2): Pt will utilize a Public affairs consultant with Mod A multimodal cueing SLP Short Term Goal 5 (Week 2): Pt will consume trials of thin liquids via a syringe without overt s/s of aspiration with 75% of trials with Min A multimodal cueing.  SLP Short Term Goal 6 (Week 2): Pt will inititate a swallow with 75% of opportunities with Min A multimodal cueing.   Skilled Therapeutic Interventions: Treatment focus on speech goals. Pt reported increased pain and appeared lethargic throughout the session. Pt utilized written expression for functional communication of wants/needs and required supervision question cues to self-monitor and correct errors. Pt transferred to recliner to increase comfort and required Min A verbal cues for safety awareness and problem solving.    FIM:  Comprehension Comprehension Mode: Auditory Comprehension: 5-Follows basic conversation/direction: With extra time/assistive device Expression Expression Mode: Verbal Expression: 4-Expresses basic 75 - 89% of the time/requires cueing 10 - 24% of the time. Needs helper to occlude trach/needs to repeat words. Social Interaction Social Interaction: 5-Interacts appropriately 90% of the time - Needs monitoring or  encouragement for participation or interaction. Problem Solving Problem Solving: 5-Solves basic 90% of the time/requires cueing < 10% of the time Memory Memory: 5-Recognizes or recalls 90% of the time/requires cueing < 10% of the time FIM - Eating Eating Activity: 0: Activity did not occur  Pain Pain Assessment Pain Assessment: No/denies pain  Therapy/Group: Individual Therapy  Anaily Ashbaugh 01/15/2013, 11:13 AM

## 2013-01-15 NOTE — Progress Notes (Signed)
Subjective/Complaints: Complains of increased gas. Pain under control A 12 point review of systems has been performed and if not noted above is otherwise negative.   Objective: Vital Signs: Blood pressure 157/86, pulse 100, temperature 98.3 F (36.8 C), temperature source Oral, resp. rate 19, height 5\' 5"  (1.651 m), weight 75.5 kg (166 lb 7.2 oz), SpO2 96.00%. No results found. No results found for this basename: WBC:2,HGB:2,HCT:2,PLT:2 in the last 72 hours No results found for this basename: NA:2,K:2,CL:2,CO:2,GLUCOSE:2,BUN:2,CREATININE:2,CALCIUM:2 in the last 72 hours CBG (last 3)   Basename 01/15/13 0713 01/15/13 0528 01/14/13 2122  GLUCAP 311* 180* 131*    Wt Readings from Last 3 Encounters:  01/15/13 75.5 kg (166 lb 7.2 oz)  12/27/12 76.8 kg (169 lb 5 oz)  10/24/12 83.961 kg (185 lb 1.6 oz)    Physical Exam:  Constitutional: He appears well-developed and well-nourished.  No distress.   HENT:  Head: Normocephalic and atraumatic.  Eyes: Pupils are equal, round, and reactive to light.  Neck: Normal range of motion.  Cardiovascular: Normal rate and regular rhythm. No murmurs auscultated  Pulmonary/Chest: Effort normal.   rhonchi generally decreased. No focal rales auscultated.  Abdominal: Soft. Bowel sounds are normal. There is tenderness (around PEG site.). There is no rebound and no guarding. He is slightly distended Musculoskeletal: He exhibits no edema. Has tenderness with palpation over the left low lumbar spine and flank into the iliac crest area. Neurological: He is very alert.   speech  garbled.--minimal word formation., aphonic Weak cough still. Can communicate thru dry erase board.  right facial weakness and poor oropharyngeal control. Still Can only minimally protrude tongue.  Weak cough. Follows basic commands without difficulty and moves all four but displays pronator drift on the RUE and decreased FMC as a whole on the right side. Strength 4 to 4+ right upper and  lower and 4+ on the left. He has pain sense on all 4 limbs Skin: Skin is warm and dry.    Assessment/Plan: 1. Functional deficits secondary to thrombotic left MCA and PCA infarcts which require 3+ hours per day of interdisciplinary therapy in a comprehensive inpatient rehab setting. Physiatrist is providing close team supervision and 24 hour management of active medical problems listed below. Physiatrist and rehab team continue to assess barriers to discharge/monitor patient progress toward functional and medical goals. FIM: FIM - Bathing Bathing Steps Patient Completed: Chest;Right Arm;Left Arm;Abdomen;Front perineal area;Buttocks;Right upper leg;Left upper leg;Right lower leg (including foot);Left lower leg (including foot) Bathing: 5: Supervision: Safety issues/verbal cues  FIM - Upper Body Dressing/Undressing Upper body dressing/undressing steps patient completed: Thread/unthread right sleeve of pullover shirt/dresss;Thread/unthread left sleeve of pullover shirt/dress;Put head through opening of pull over shirt/dress;Pull shirt over trunk Upper body dressing/undressing: 5: Set-up assist to: Obtain clothing/put away FIM - Lower Body Dressing/Undressing Lower body dressing/undressing steps patient completed: Thread/unthread right underwear leg;Thread/unthread left underwear leg;Pull underwear up/down;Thread/unthread right pants leg;Thread/unthread left pants leg;Pull pants up/down;Fasten/unfasten pants;Don/Doff right shoe;Don/Doff left shoe;Don/Doff right sock;Don/Doff left sock Lower body dressing/undressing: 4: Steadying Assist  FIM - Toileting Toileting steps completed by patient: Performs perineal hygiene;Adjust clothing prior to toileting;Adjust clothing after toileting Toileting Assistive Devices: Grab bar or rail for support Toileting: 5: Supervision: Safety issues/verbal cues  FIM - Diplomatic Services operational officer Devices: Grab bars;Walker Toilet Transfers: 5-To  toilet/BSC: Supervision (verbal cues/safety issues);5-From toilet/BSC: Supervision (verbal cues/safety issues)  FIM - Press photographer Assistive Devices: Bed rails Bed/Chair Transfer: 5: Chair or W/C > Bed: Supervision (verbal cues/safety issues);5:  Bed > Chair or W/C: Supervision (verbal cues/safety issues)  FIM - Locomotion: Wheelchair Distance: 40' Locomotion: Wheelchair: 1: Total Assistance/staff pushes wheelchair (Pt<25%) FIM - Locomotion: Ambulation Locomotion: Ambulation Assistive Devices: Designer, industrial/product Ambulation/Gait Assistance: 4: Min assist Locomotion: Ambulation: 5: Travels 150 ft or more with supervision/safety issues  Comprehension Comprehension Mode: Auditory Comprehension: 5-Understands complex 90% of the time/Cues < 10% of the time  Expression Expression Mode: Verbal Expression Assistive Devices: 6-Communication board Expression: 4-Expresses basic 75 - 89% of the time/requires cueing 10 - 24% of the time. Needs helper to occlude trach/needs to repeat words.  Social Interaction Social Interaction: 5-Interacts appropriately 90% of the time - Needs monitoring or encouragement for participation or interaction.  Problem Solving Problem Solving: 5-Solves complex 90% of the time/cues < 10% of the time  Memory Memory: 4-Recognizes or recalls 75 - 89% of the time/requires cueing 10 - 24% of the time  Medical Problem List and Plan:  1. DVT Prophylaxis/Anticoagulation: Pharmaceutical: Lovenox  2. Chronic pain: has done well with fentanyl patch in addition  to his prn hydrocodone. Consider increasing to patch depending on pain levels. His pain has been improved overall 3. Mood: see below.  4. Neuropsych: This patient is not capable of making decisions on his/her own behalf.  5. DM type 2: increased AM lantus. continue PM dose . ssi coverage also.--follow for further trend. 6. Stage IV Lung cancer: Use oxygen at home per wife. . Use nebs  as needed 7. HTN: checking BP on bid basis. On lotrel PTA,resumed amlodipine 8. Dysphagia:  -minimal improvement  -bolus feeds  -major aspiration risk--HOB at 30 degrees or greater, reviewed with daughter  -follow closely for temps, labs, etc--remains afebrile  -bedside suction as needed.  -encouraged IS  -scheduled simethicone for gas 9. Signs of reactive depression- continue low dose celexa 10mg  qhs  LOS (Days) 13 A FACE TO FACE EVALUATION WAS PERFORMED  SWARTZ,ZACHARY T 01/15/2013 7:45 AM

## 2013-01-15 NOTE — Progress Notes (Addendum)
Inpatient Diabetes Program Recommendations  AACE/ADA: New Consensus Statement on Inpatient Glycemic Control (2013)  Target Ranges:  Prepandial:   less than 140 mg/dL      Peak postprandial:   less than 180 mg/dL (1-2 hours)      Critically ill patients:  140 - 180 mg/dL   Reason for Visit: Hyperglycemia: insulin not being given when tube feeds are administered.  Inpatient Diabetes Program Recommendations Correction (SSI): Please change corrction scale to be given at the same time the bolus feeds are given: 0600, 1100, 1400, 1800, 2200 (rather than q 6 hrs which doesn't coincide with tube feed coverage.) Insulin - Meal Coverage: [id number]  Note: Noted old consult for insulin administration education. Though this has already been addressed by one of our coordinators, it is not recommended to send this patient home on insulin. If caretaker is to administer, bedside RN can teach the caregiver how to administer, signs and symptoms of hypoglycemia/hyperglycemia, treatments, etc.  Pt would need 24 hr care if to be on insulin at home. Glad to order starter kit if this is still an issue and assist with insulin instruction with the bedside RN.  Addendum: Noted pt now going to SNF. Please do not use lantus once a day to cover tube feeds. Tube feeds may be interrupted or held at any time, but once given, the lantus cannot be taken back.  Again, recommend using only moderate correction scale at same time the bolus tube feeds are given. Thank you, Lenor Coffin, RN, CNS, Diabetes Coordinator [phone])

## 2013-01-15 NOTE — Progress Notes (Signed)
Discharge summary [id number]

## 2013-01-15 NOTE — Discharge Summary (Signed)
NAMECRISTINA, MATTERN NO.:  [id number]  MEDICAL RECORD NO.:  [id number]  LOCATION:  4008                         FACILITY:  [facility]  PHYSICIAN:  Ranelle Oyster, M.D.DATE OF BIRTH:  07/21/1929  DATE OF ADMISSION:  01/02/2013 DATE OF DISCHARGE:  01/16/2013                              DISCHARGE SUMMARY   DISCHARGE DIAGNOSES: 1. 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 2. Stage IV lung cancer. 3. Diabetes mellitus type 2. 4. Reactive depression.  HISTORY OF PRESENT ILLNESS:  Mr. Zidan Helget is an 79 year old male with history of diabetes mellitus, stage IV metastatic lung cancer, who was admitted on December 15, 2012, with acute mental status changes with progressive shortness of breath and chills.  He required intubation due to hypoxia and metabolic acidosis. He was treated with antibiotics for HCAP.  Chest x-ray done showed loculated malignant left effusion, which is being monitored, as patient has had improvement.  The patient was noted to have mutism with facial weakness, and MRI of brain done showed suspicion of Acute/subacute infarcts in right internal capsule.  EEG showed mild slowing with question of metabolic encephalopathy.  He was extubated on January 8th.  Bedside swallow was done, and n.p.o. was recommended. The patient was unable to tolerate NG tube, but indicated hunger.  He continued to have difficulty handling oral secretions, requiring frequent suctioning.  Followup MRI on January 11, showed recurrent acute- on-chronic ischemia, most pronounced in left MCA and PCA territory with slight increase in petechial hemorrhage associated with left temporal operculum lesion.  PEG was placement by Interventional Radiology on January 14 , 2014 and tube feeds initiated.  Therapys evaluation was done and the patient was noted to be limited by balance deficits as well as diplopia, generalized weakness,  dysphagia.  He remains nonverbal with open- mouth posture almost consistently.  Rehab services were asked to evaluate the patient as therapy team recommended CIR.  The patient was felt to be an appropriate candidate, and was admitted on January 15 for progressive therapy.  PAST MEDICAL HISTORY:  Diabetes mellitus, hypertension, dyslipidemia, mini-stroke, GERD, DDD with chronic back pain, BPH, history of DVT, diverticulosis, and lung cancer.  FUNCTIONAL HISTORY:  The patient has been housebound and had been getting home therapies.  FUNCTIONAL STATUS:  The patient was at supervision to min assist for bed mobility, min assist 70% for transfers, min assist 60% for ambulating 30 feet with a rolling walker.  He required min assist for upper body bathing and dressing tasks, mod assist for lower body bathing, max assist for lower body dressing.  He was noted to have impairments and problem solving.  Attempted to communicate by writing though this was mostly unintelligible and poorly written.  LABORATORY DATA:  Check of lytes from January 16, revealed sodium 138, potassium 4.0, chloride 103, CO2 of 24, BUN 13, creatinine 0.96, glucose 202.  CBC reveals hemoglobin 10.2, hematocrit 33.0, white count 5.3, platelets 332.  HOSPITAL COURSE:  Mr. Sutter Ahlgren was admitted to Rehab on January 02, 2013, for inpatient therapies that consist of PT, OT, and speech therapy at least 3 hours 5 days  a week.  Past admission, physiatrist, rehab, RN, and therapy team have worked together to provide customized collaborative interdisciplinary care.  Rehab RN has worked with the patient on bowel and bladder program as well as tube feed administration and monitoring.  The patient's blood sugars were checked on q.i.d. basis prior to tube feeds.  His Lantus insulin has been slowly titrated for tighter blood sugar control and sliding scale insulin was used additionally.  Blood sugars have been ranging from 130s to mid  200 range with occasional high in 300.  The patient's ability to handle oral secretions has slightly improved.  The patient continues on 2 L O2 per nasal cannula.  He has history of chronic pain but was unable to tolerate MSIR due to sedative effects. Tthe patient was changed to Duragesic patch with hydrocodone on p.r.n. basis with relief and no side effects.   He remains NPO. Symptoms of bloating were treated with scheduled simethicone. During the patient's stay in rehab, weekly team conferences were held to monitor the patient's progress, set goals, as well as discuss barriers to discharge.  The patient continues to have garbled speech with minimal word formation.  He continues to display pronator drift on right upper extremity with decreased fine motor coordination. Strength of right upper and lower extremity is 4 to 4+, and left upper and lower extremity at 4+.  Pain sensation is intact.  PEG site shows minimal tenderness.  No drainage.  OT has been working with patient on self-care tasks.  The patient is able to perform grooming, bathing, and dressing tasks at shower level with supervision.  Requires minimal assist with left lower extremity due to  abdominal discomfort with bending tasks.  The patient is able to ambulate in control in household environment at supervision level with rolling walker.  He is able to navigate 1 flight of stairs with supervision but requires frequent rest breaks.   Speech therapy has been working with patient on functional problem solving as well as dysphagia.  The patient is able to utilize written expression for functional communication of wants and needs. He requires supervision with questioning cues to self monitor and correct for errors.  He is able to follow basic conversation and directions with extra time, and assisted device.  Problem solving for basic tasks as well as memory is at 90% accuracy. Family is unable to provide assistance needed and has elected on  further therapies at SNF.  Patient  is discharged to [facility] on 01/16/13 in improved condition.  DISCHARGE MEDICATIONS: 1. Norvasc 5 mg daily per tube. 2. Aspirin 325 mg daily per tube. 3. Celexa 10 mg daily at bedtime. 4. Fentanyl patch 12.5 mcg change to 72 hours. 5. H2O flushes 200 mL q.i.d. 6. Lantus insulin 15 units subcu q.12 hours. 7. Protonix 40 mg per PEG daily. 8. MiraLax 17 g in 8 ounces per tube daily. 9. Norco 5/325mg  one every 4 hours prn pain. 10. SSI per protocol. 11. Jevity 350 cc five times a day (7am, noon, 1700 and 2200)  DIET:  N.p.o.  SPECIAL INSTRUCTIONS:  Incentive spirometry while awake.  Keep head of bed at 30 degrees or greater at all times.  Suction as needed.  O2 of 2 L per nasal cannula to keep sats greater than 90%.  Continue oral care on q.i.d. basis.  Routine PEG care. Progressive PT, OT, speech therapy to continue past discharge. Monitor BS four to five times a day prior to tube feeds and cover with  SSI per protocol.   FOLLOWUP:  The patient to follow up with Dr. Pearlean Brownie in 6 weeks.  Follow up with Dr. Riley Kill on 02/13/13 at 10:30 for 11 am appointment .  Follow up with Dr. Arbutus Ped in the next 2- 3 weeks.     Delle Reining, P.A.   ______________________________ Ranelle Oyster, M.D.    PL/MEDQ  D:  01/15/2013  T:  01/15/2013  Job:  [id number]  cc:   Lajuana Matte, M.D. Pramod P. Pearlean Brownie, MD Fleet Contras, MD

## 2013-01-15 NOTE — Progress Notes (Signed)
Physical Therapy Note  Patient Details  Name: Johnathan Bowman MRN: [id number] Date of Birth: [birth year] Today's Date: 01/15/2013  Time: 1510-1525 15 minutes  Pt initially refused PT treatment due to pain in sternum, ribs, RN made aware.  With encouragement pt agreeable to participate in gait training for increased activity tolerance.  Gait throughout unit > 500' with obstacle negotiation and gait in home environment all with RW with supervision.  Individual therapy   Johnathan Bowman 01/15/2013, 3:24 PM

## 2013-01-15 NOTE — Progress Notes (Signed)
Occupational Therapy Discharge Summary  Patient Details  Name: Johnathan Bowman MRN: [id number] Date of Birth: [birth year]  Today's Date: 01/15/2013 Time: 4098-1191 Time Calculation (min): 40 min  Patient in bed upon arrival but willing to participate in self-care session with focus on initiation of task, sit to stands, and activity tolerance. Pt. Performed self care task (bathing, dressing, oral hygiene) sitting at sink level with supervision/set up and verbal suggestions to facilitate decision making. Pt. with decreased lip closure while performing functional task, however self- monitored spillage from mouth with not no cuing.   Patient has met 9 of 9 long term goals due to improved activity tolerance, improved balance, postural control and improved coordination.  Patient to discharge at overall Supervision  And setup level with self care task and transfers. Patient's care partner requires assistance to provide the necessary physical assistance at discharge and can not provide the level of assist and care pt needs at this time, pt will benefit from ongoing skilled OT at SNF level.    Reasons goals not met: n/a  Recommendation:  Patient will benefit from ongoing skilled OT services in skilled nursing facility setting to continue to advance functional skills in the area of BADL and Reduce care partner burden.  Equipment: No equipment provided  Reasons for discharge: treatment goals met and discharge from hospital  Patient/family agrees with progress made and goals achieved: Yes  OT Discharge Precautions/Restrictions  Restrictions Weight Bearing Restrictions: No General   Vital Signs Therapy Vitals BP: 160/80 mmHg Pain Pain Assessment Pain Assessment: No/denies pain Pain Score:   2 Faces Pain Scale: Hurts little more Pain Type: Acute pain Pain Location: Back Pain Orientation: Left;Lower;Lateral Pain Descriptors: Aching Pain Onset: Gradual Patients Stated Pain Goal: 2 Pain  Intervention(s): RN made aware Multiple Pain Sites: No ADL ADL Grooming: Supervision/safety;Setup Where Assessed-Grooming: Sitting at sink Upper Body Bathing: Supervision/safety;Setup Where Assessed-Upper Body Bathing: Shower Lower Body Bathing: Supervision/safety;Setup Where Assessed-Lower Body Bathing: Shower (Requires assitance only when having abdominal pain) Upper Body Dressing: Supervision/safety;Setup Where Assessed-Upper Body Dressing: Sitting at sink;Wheelchair;Chair Lower Body Dressing: Supervision/safety;Setup Toileting: Supervision/safety Where Assessed-Toileting: Teacher, adult education Method: Proofreader: Engineer, technical sales: Close supervison Web designer Method: Ship broker: Designer, multimedia: Close supervision Film/video editor Method: Designer, industrial/product: Transfer tub bench;Grab bars Vision/Perception  Vision - History Baseline Vision: Wears glasses all the time Patient Visual Report: No change from baseline;Diplopia;Blurring of vision Vision - Assessment Eye Alignment: Impaired (comment) Ocular Range of Motion: Restricted looking up Tracking/Visual Pursuits: Decreased smoothness of eye movement to RIGHT superior field;Requires cues, head turns, or add eye shifts to track;Decreased smoothness of horizontal tracking;Decreased smoothness of eye movement to LEFT superior field Saccades: Additional head turns occurred during testing;Additional eye shifts occurred during testing Convergence: Impaired (comment) Diplopia Assessment: Only with right gaze;Only with left gaze Perception Perception: Impaired Spatial Orientation: Impaired with vision Praxis Praxis: Intact  Cognition Arousal/Alertness: Awake/alert Orientation Level: Oriented X4 Focused Attention: Appears intact Sustained Attention: Appears intact Selective Attention: Appears  intact Memory: Appears intact Awareness Impairment: Emergent impairment;Anticipatory impairment Problem Solving: Appears intact Safety/Judgment: Appears intact Sensation Sensation Light Touch: Appears Intact Hot/Cold: Appears Intact Proprioception: Appears Intact Coordination Gross Motor Movements are Fluid and Coordinated: Yes Motor  Motor Motor: Within Functional Limits Mobility  Bed Mobility Bed Mobility: Supine to Sit;Sitting - Scoot to Edge of Bed;Sit to Supine Supine to Sit: 5: Supervision Supine to Sit Details: Verbal cues for precautions/safety Sitting -  Scoot to [place] of Bed: 5: Supervision Sitting - Scoot to [place] of Bed Details: Verbal cues for precautions/safety Sit to Supine: 5: Supervision Sit to Supine - Details: Verbal cues for precautions/safety Transfers Sit to Stand: 5: Supervision Sit to Stand Details: Verbal cues for precautions/safety Stand to Sit: 5: Supervision Stand to Sit Details (indicate cue type and reason): Verbal cues for precautions/safety  Trunk/Postural Assessment  Cervical Assessment Cervical Assessment: Within Functional Limits Thoracic Assessment Thoracic Assessment: Within Functional Limits Lumbar Assessment Lumbar Assessment: Within Functional Limits Postural Control Postural Control: Within Functional Limits  Balance Balance Balance Assessed: Yes Static Sitting Balance Static Sitting - Balance Support: Feet supported;No upper extremity supported Static Sitting - Level of Assistance: 6: Modified independent (Device/Increase time) Static Standing Balance Static Standing - Balance Support: No upper extremity supported Static Standing - Level of Assistance: 5: Stand by assistance Dynamic Standing Balance Dynamic Standing - Level of Assistance: 5: Stand by assistance Extremity/Trunk Assessment RUE Assessment RUE Assessment: Within Functional Limits LUE Assessment LUE Assessment: Within Functional Limits  See FIM for current  functional status  L[place], Tamika OTA/S Occupational Therapy Assistant Student 01/15/2013, 12:02 PM

## 2013-01-15 NOTE — Patient Care Conference (Signed)
Inpatient RehabilitationTeam Conference and Plan of Care Update Date: 01/15/2013   Time: 2:55 PM    Patient Name: Johnathan Bowman      Medical Record Number: [id number]  Date of Birth: May 22, 1929 Sex: Male         Room/Bed: 4008/4008-01 Payor Info: Payor: MEDICARE  Plan: MEDICARE PART A AND B  Product Type: *No Product type*     Admitting Diagnosis: L CVA  Admit Date/Time:  01/02/2013  3:34 PM Admission Comments: No comment available   Primary Diagnosis:  CVA (cerebral infarction) Principal Problem: CVA (cerebral infarction)  Patient Active Problem List   Diagnosis Date Noted  . Dysphagia 12/30/2012  . Diabetes mellitus 12/30/2012  . GERD (gastroesophageal reflux disease) 12/30/2012  . Lung cancer 12/27/2012  . Acute respiratory failure 12/27/2012  . COPD (chronic obstructive pulmonary disease) 12/27/2012  . CVA (cerebral infarction) 12/27/2012  . Aphasia 12/27/2012  . Facial weakness 12/20/2012  . Mutism 12/20/2012  . Hypotension 12/15/2012  . Altered mental status 12/15/2012  . Lower extremity edema 09/12/2012  . CAP (community acquired pneumonia) 09/10/2012  . Hypoxia 09/10/2012  . Hypercoagulopathy 09/10/2012  . UTI (lower urinary tract infection) 09/10/2012  . Hematuria 09/10/2012  . Hx of radiation therapy   . Cancer 05/30/2012  . DVT (deep venous thrombosis) 01/25/2012  . DVT of leg (deep venous thrombosis) 01/18/2012  . ADENOCARCIMONA, LUNG, NONSMALL CELL 01/12/2011  . PLEURAL EFFUSION, LEFT 12/08/2010  . PULMONARY NODULE, LEFT UPPER LOBE 12/08/2010    Expected Discharge Date: Expected Discharge Date: 01/16/13 (to SNF)  Team Members Present: Physician leading conference: Dr. Faith Rogue Social Worker Present: Amada Jupiter, LCSW Nurse Present: Carmie End, RN PT Present: Reggy Eye, PT OT Present: Roney Mans, OT;Ardis Rowan, COTA SLP Present: Feliberto Gottron, SLP Other (Discipline and Name): Tora Duck, PPS Coordinator     Current Status/Progress  Goal Weekly Team Focus  Medical   pain mgt better. handling secretions better  see prior  finalize medical mgt for dc planning   Bowel/Bladder   Rare episode incontinence, continent bowel  Continent B/B  Monitor, offer urinal   Swallow/Nutrition/ Hydration   NPO  Max A for participation in dysphagia treatment  trials of ice chips and thin liquids via syringe   ADL's   Supervision/set up   Supervision/set up  Initiating self care task, visual exercises for diplopia, sit to stands, and functional mobility   Mobility   supervision  supervision  d/c planning   Communication   Min A  Min A  multimodal communication, vocalizations, oral-motor exercises   Safety/Cognition/ Behavioral Observations  Min A-Supervision  Supervision  problem solving    Pain   Left lateral back pain, occasional c/o left shoulder, neck pain; Vicodin, Fentynl patch,heat effective.  Managed 2/10  monitor, offer PRN's   Skin   Peg site CDI, mild redness at buttocks, blanchable.  No breakdown, no s/s  infection at DC  Monitor      *See Interdisciplinary Assessment and Plan and progress notes for long and short-term goals  Barriers to Discharge: see prior    Possible Resolutions to Barriers:  see prior    Discharge Planning/Teaching Needs:  SNF - transfer to facility 01/16/13      Team Discussion:  Pt ready for transition tomorrow to SNF  Revisions to Treatment Plan:  None   Continued Need for Acute Rehabilitation Level of Care: The patient requires daily medical management by a physician with specialized training in physical medicine and rehabilitation for  the following conditions: Daily direction of a multidisciplinary physical rehabilitation program to ensure safe treatment while eliciting the highest outcome that is of practical value to the patient.: Yes Daily medical management of patient stability for increased activity during participation in an intensive rehabilitation regime.: Yes Daily analysis  of laboratory values and/or radiology reports with any subsequent need for medication adjustment of medical intervention for : Neurological problems;Other (dysphagia, pain mgt)  Johnathan Bowman 01/15/2013, 4:35 PM

## 2013-01-15 NOTE — Progress Notes (Signed)
Social Work Patient ID: Johnathan Bowman, male   DOB: 24-May-1929, 79 y.o.   MRN: [id number]  Have received three SNF bed offers for pt - all presented to pt, his wife and his nephew.  Wife and nephew visited facilities today and have decided to accept SNF bed at [facility].  Plan for transfer tomorrow via ambulance.  Turkessa Ostrom, LCSW

## 2013-01-16 LAB — GLUCOSE, CAPILLARY
Glucose-Capillary: 114 mg/dL — ABNORMAL HIGH (ref 70–99)
Glucose-Capillary: 312 mg/dL — ABNORMAL HIGH (ref 70–99)

## 2013-01-16 LAB — CREATININE, SERUM: Creatinine, Ser: 0.77 mg/dL (ref 0.50–1.35)

## 2013-01-16 NOTE — Progress Notes (Signed)
Patient discharged to [facility].  Report called to Elsie Lincoln, Director.  Patient escorted via stretcher with non-emergent transportation.  Vital signs stable.

## 2013-01-16 NOTE — Progress Notes (Signed)
Physical Therapy Discharge Summary  Patient Details  Name: Johnathan Bowman MRN: [id number] Date of Birth: Sep 20, 1929  Today's Date: 01/16/2013  Patient has met 5 of 5 long term goals due to improved activity tolerance, improved balance and improved postural control.  Patient to discharge at an ambulatory level Supervision with RW.  Reasons goals not met: n/a  Recommendation:  Patient will benefit from ongoing skilled PT services in skilled nursing facility setting to continue to advance safe functional mobility, address ongoing impairments in balance, activity tolerance, gait, and minimize fall risk.  Equipment: No equipment provided  Reasons for discharge: treatment goals met and discharge from hospital  Patient/family agrees with progress made and goals achieved: Yes  PT Discharge Cognition Overall Cognitive Status: Appears within functional limits for tasks assessed Sensation Sensation Light Touch: Appears Intact Proprioception: Appears Intact Coordination Gross Motor Movements are Fluid and Coordinated: Yes Motor  Motor Motor: Within Functional Limits   Trunk/Postural Assessment  Cervical Assessment Cervical Assessment: Within Functional Limits Thoracic Assessment Thoracic Assessment: Within Functional Limits Lumbar Assessment Lumbar Assessment: Within Functional Limits Postural Control Postural Control: Within Functional Limits  Balance Static Sitting Balance Static Sitting - Level of Assistance: 7: Independent Static Standing Balance Static Standing - Level of Assistance: 6: Modified independent (Device/Increase time) Dynamic Standing Balance Dynamic Standing - Level of Assistance: 5: Stand by assistance Extremity Assessment      RLE Assessment RLE Assessment: Within Functional Limits LLE Assessment LLE Assessment: Within Functional Limits  See FIM for current functional status  [name] 01/16/2013, 7:49 AM

## 2013-01-16 NOTE — Progress Notes (Signed)
Subjective/Complaints: No complaints today. Wonders what time he'll leave A 12 point review of systems has been performed and if not noted above is otherwise negative.   Objective: Vital Signs: Blood pressure 134/74, pulse 98, temperature 98.4 F (36.9 C), temperature source Oral, resp. rate 17, height 5\' 5"  (1.651 m), weight 77.5 kg (170 lb 13.7 oz), SpO2 95.00%. No results found. No results found for this basename: WBC:2,HGB:2,HCT:2,PLT:2 in the last 72 hours No results found for this basename: NA:2,K:2,CL:2,CO:2,GLUCOSE:2,BUN:2,CREATININE:2,CALCIUM:2 in the last 72 hours CBG (last 3)   Basename 01/16/13 0712 01/16/13 0610 01/15/13 2155  GLUCAP 180* 114* 274*    Wt Readings from Last 3 Encounters:  01/16/13 77.5 kg (170 lb 13.7 oz)  12/27/12 76.8 kg (169 lb 5 oz)  10/24/12 83.961 kg (185 lb 1.6 oz)    Physical Exam:  Constitutional: He appears well-developed and well-nourished.  No distress.   HENT:  Head: Normocephalic and atraumatic.  Eyes: Pupils are equal, round, and reactive to light.  Neck: Normal range of motion.  Cardiovascular: Normal rate and regular rhythm. No murmurs auscultated  Pulmonary/Chest: Effort normal.   rhonchi generally decreased. No  rales auscultated.  Abdominal: Soft. Bowel sounds are normal. There is tenderness (around PEG site.). There is no rebound and no guarding. He is slightly distended Musculoskeletal: He exhibits no edema. Has tenderness with palpation over the left low lumbar spine and flank into the iliac crest area. Neurological: He is very alert.   speech  garbled.--minimal word formation., aphonic Weak cough still. Can communicate thru dry erase board.  right facial weakness and poor oropharyngeal control. Still Can only minimally protrude tongue.  Weak cough. Follows basic commands without difficulty and moves all four but displays pronator drift on the RUE and decreased FMC as a whole on the right side. Strength 4 to 4+ right upper and  lower and 4+ on the left. He has pain sense on all 4 limbs Skin: Skin is warm and dry.    Assessment/Plan: 1. Functional deficits secondary to thrombotic left MCA and PCA infarcts which require 3+ hours per day of interdisciplinary therapy in a comprehensive inpatient rehab setting. Physiatrist is providing close team supervision and 24 hour management of active medical problems listed below. Physiatrist and rehab team continue to assess barriers to discharge/monitor patient progress toward functional and medical goals.  Dc to snf today. Follow up with me in 4 weeks.   FIM: FIM - Bathing Bathing Steps Patient Completed: Chest;Right Arm;Left Arm;Abdomen;Front perineal area;Right upper leg;Left upper leg;Right lower leg (including foot);Left lower leg (including foot) (Bathing at sink level per pt. request) Bathing: 5: Supervision: Safety issues/verbal cues  FIM - Upper Body Dressing/Undressing Upper body dressing/undressing steps patient completed: Thread/unthread right sleeve of pullover shirt/dresss;Thread/unthread left sleeve of pullover shirt/dress;Put head through opening of pull over shirt/dress;Pull shirt over trunk Upper body dressing/undressing: 5: Set-up assist to: Obtain clothing/put away FIM - Lower Body Dressing/Undressing Lower body dressing/undressing steps patient completed: Thread/unthread right underwear leg;Thread/unthread left underwear leg;Pull underwear up/down;Thread/unthread right pants leg;Thread/unthread left pants leg;Pull pants up/down;Don/Doff right shoe;Don/Doff left shoe;Don/Doff right sock;Don/Doff left sock Lower body dressing/undressing: 5: Set-up assist to: Obtain clothing  FIM - Toileting Toileting steps completed by patient: Adjust clothing prior to toileting;Performs perineal hygiene;Adjust clothing after toileting Toileting Assistive Devices: Grab bar or rail for support Toileting: 4: Steadying assist  FIM - Scientist, research (physical sciences) Devices: Grab bars;Walker Toilet Transfers: 5-To toilet/BSC: Supervision (verbal cues/safety issues)  FIM - Games developer  Transfer Assistive Devices: Environmental consultant;Bed rails Bed/Chair Transfer: 6: Bed > Chair or W/C: No assist;6: Chair or W/C > Bed: No assist  FIM - Locomotion: Wheelchair Distance: 40' Locomotion: Wheelchair: 0: Activity did not occur FIM - Locomotion: Ambulation Locomotion: Ambulation Assistive Devices: Designer, industrial/product Ambulation/Gait Assistance: 4: Min assist Locomotion: Ambulation: 5: Travels 150 ft or more with supervision/safety issues  Comprehension Comprehension Mode: Auditory Comprehension: 5-Follows basic conversation/direction: With extra time/assistive device  Expression Expression Mode: Verbal Expression Assistive Devices: 6-Communication board Expression: 3-Expresses basic 50 - 74% of the time/requires cueing 25 - 50% of the time. Needs to repeat parts of sentences.  Social Interaction Social Interaction: 5-Interacts appropriately 90% of the time - Needs monitoring or encouragement for participation or interaction.  Problem Solving Problem Solving: 5-Solves basic 90% of the time/requires cueing < 10% of the time  Memory Memory: 4-Recognizes or recalls 75 - 89% of the time/requires cueing 10 - 24% of the time  Medical Problem List and Plan:  1. DVT Prophylaxis/Anticoagulation: Pharmaceutical: Lovenox  2. Chronic pain: has done well with fentanyl patch in addition  to his prn hydrocodone. Consider increasing to patch depending on pain levels. His pain has been improved overall 3. Mood: see below.  4. Neuropsych: This patient is not capable of making decisions on his/her own behalf.  5. DM type 2: increased AM lantus. continue PM dose . ssi coverage also.--follow for further trend. 6. Stage IV Lung cancer: Use oxygen at home per wife. . Use nebs as needed 7. HTN: checking BP on bid basis. On lotrel PTA,resumed  amlodipine 8. Dysphagia:  -minimal improvement  -bolus feeds  -major aspiration risk--HOB at 30 degrees or greater, reviewed with daughter  -follow closely for temps, labs, etc--remains afebrile  -bedside suction as needed.  -encouraged IS  -scheduled simethicone for gas 9. reactive depression- continue low dose celexa 10mg  qhs  LOS (Days) 14 A FACE TO FACE EVALUATION WAS PERFORMED  Kaleab Frasier T 01/16/2013 8:03 AM

## 2013-01-16 NOTE — Progress Notes (Signed)
Speech Language Pathology Discharge Summary  Patient Details  Name: Johnathan Bowman MRN: [id number] Date of Birth: [birth year]  Today's Date: 01/16/2013  Patient has met 5 of 5 long term goals.  Patient to discharge at overall Min level for functional communication and basic cognitive function and total A for swallowing function and verbal expression.   Reasons goals not met: N/A   Clinical Impression/Discharge Summary: Patient has met 5 of 5 long term goals due to improved management of secretions, expression of wants/needs via multimodal communication, emergent awareness and functional problem solving. Patient to discharge at [facility] A level for functional communication and cognitive function. Pt continues to demonstrate severe apraxia and is unable to perform basic oral-motor commands and currently remains NPO due to severely impaired AP transit with all textures. Pt was tolerating trials of thin liquids via syringe (1/2 tsp) with minimal overt s/s of aspiration. Patient's care partner requires assistance to provide the necessary physical and cognitive assistance at discharge and cannot provide the level of assist and care pt needs at this time, pt will benefit from ongoing skilled SLP intervention at SNF level.    Care Partner:  Caregiver Able to Provide Assistance: No  Type of Caregiver Assistance: Physical;Cognitive  Recommendation:  Skilled Nursing facility  Rationale for SLP Follow Up: Maximize functional communication;Maximize cognitive function and independence;Maximize swallowing safety;Reduce caregiver burden   Equipment: N/A   Reasons for discharge: Treatment goals met;Discharged from hospital   Patient/Family Agrees with Progress Made and Goals Achieved: Yes   See FIM for current functional status  Nyoka Alcoser 01/16/2013, 4:06 PM

## 2013-01-16 NOTE — Progress Notes (Signed)
Social Work  Discharge Note  The overall goal for the admission was met for:   Discharge location: No - plan changed to SNF  Length of Stay: No - extended due to SNF bed search = 14 days  Discharge activity level: Yes - supervision overall  Home/community participation: Yes  Services provided included: MD, RD, PT, OT, SLP, RN, TR, Pharmacy and SW  Financial Services: Medicare  Follow-up services arranged: Other: SNF at [facility]  Comments (or additional information):  Patient/Family verbalized understanding of follow-up arrangements: Yes  Individual responsible for coordination of the follow-up plan: patient (with nephew assisting)  Confirmed correct DME delivered: NA    Ellaina Schuler

## 2013-01-25 ENCOUNTER — Other Ambulatory Visit: Payer: Self-pay | Admitting: Endocrinology

## 2013-01-25 DIAGNOSIS — R109 Unspecified abdominal pain: Secondary | ICD-10-CM

## 2013-01-30 ENCOUNTER — Other Ambulatory Visit: Payer: Medicare Other

## 2013-01-30 ENCOUNTER — Ambulatory Visit
Admission: RE | Admit: 2013-01-30 | Discharge: 2013-01-30 | Disposition: A | Discharge status: Home / Self Care | Payer: Medicare Other | Source: Ambulatory Visit | Attending: Endocrinology | Admitting: Endocrinology

## 2013-01-30 DIAGNOSIS — R109 Unspecified abdominal pain: Secondary | ICD-10-CM

## 2013-01-30 MED ORDER — IOHEXOL 300 MG/ML  SOLN
100.0000 mL | Freq: Once | INTRAMUSCULAR | Status: AC | PRN
  Administered 2013-01-30: 100 mL via INTRAVENOUS

## 2013-01-31 ENCOUNTER — Encounter (HOSPITAL_COMMUNITY): Payer: Self-pay | Admitting: *Deleted

## 2013-01-31 ENCOUNTER — Emergency Department (HOSPITAL_COMMUNITY): Payer: Medicare Other

## 2013-01-31 ENCOUNTER — Inpatient Hospital Stay (HOSPITAL_COMMUNITY)
Admission: EM | Admit: 2013-01-31 | Discharge: 2013-02-16 | DRG: 871 | Disposition: E | Payer: Medicare Other | Attending: Emergency Medicine | Admitting: Emergency Medicine

## 2013-01-31 DIAGNOSIS — N400 Enlarged prostate without lower urinary tract symptoms: Secondary | ICD-10-CM | POA: Diagnosis present

## 2013-01-31 DIAGNOSIS — C3410 Malignant neoplasm of upper lobe, unspecified bronchus or lung: Secondary | ICD-10-CM | POA: Diagnosis present

## 2013-01-31 DIAGNOSIS — R4702 Dysphasia: Secondary | ICD-10-CM | POA: Diagnosis present

## 2013-01-31 DIAGNOSIS — IMO0002 Reserved for concepts with insufficient information to code with codable children: Secondary | ICD-10-CM | POA: Diagnosis present

## 2013-01-31 DIAGNOSIS — C801 Malignant (primary) neoplasm, unspecified: Secondary | ICD-10-CM

## 2013-01-31 DIAGNOSIS — C3490 Malignant neoplasm of unspecified part of unspecified bronchus or lung: Secondary | ICD-10-CM | POA: Diagnosis present

## 2013-01-31 DIAGNOSIS — I639 Cerebral infarction, unspecified: Secondary | ICD-10-CM

## 2013-01-31 DIAGNOSIS — I6350 Cerebral infarction due to unspecified occlusion or stenosis of unspecified cerebral artery: Secondary | ICD-10-CM

## 2013-01-31 DIAGNOSIS — I10 Essential (primary) hypertension: Secondary | ICD-10-CM | POA: Diagnosis present

## 2013-01-31 DIAGNOSIS — I498 Other specified cardiac arrhythmias: Secondary | ICD-10-CM | POA: Diagnosis not present

## 2013-01-31 DIAGNOSIS — Z87891 Personal history of nicotine dependence: Secondary | ICD-10-CM

## 2013-01-31 DIAGNOSIS — J9600 Acute respiratory failure, unspecified whether with hypoxia or hypercapnia: Secondary | ICD-10-CM

## 2013-01-31 DIAGNOSIS — Z86718 Personal history of other venous thrombosis and embolism: Secondary | ICD-10-CM

## 2013-01-31 DIAGNOSIS — R1319 Other dysphagia: Secondary | ICD-10-CM | POA: Diagnosis present

## 2013-01-31 DIAGNOSIS — J189 Pneumonia, unspecified organism: Secondary | ICD-10-CM

## 2013-01-31 DIAGNOSIS — A419 Sepsis, unspecified organism: Principal | ICD-10-CM | POA: Diagnosis present

## 2013-01-31 DIAGNOSIS — J449 Chronic obstructive pulmonary disease, unspecified: Secondary | ICD-10-CM

## 2013-01-31 DIAGNOSIS — I69920 Aphasia following unspecified cerebrovascular disease: Secondary | ICD-10-CM

## 2013-01-31 DIAGNOSIS — K219 Gastro-esophageal reflux disease without esophagitis: Secondary | ICD-10-CM | POA: Diagnosis present

## 2013-01-31 DIAGNOSIS — E119 Type 2 diabetes mellitus without complications: Secondary | ICD-10-CM | POA: Diagnosis present

## 2013-01-31 DIAGNOSIS — E785 Hyperlipidemia, unspecified: Secondary | ICD-10-CM | POA: Diagnosis present

## 2013-01-31 DIAGNOSIS — J984 Other disorders of lung: Secondary | ICD-10-CM | POA: Diagnosis present

## 2013-01-31 DIAGNOSIS — Z7982 Long term (current) use of aspirin: Secondary | ICD-10-CM

## 2013-01-31 DIAGNOSIS — I2510 Atherosclerotic heart disease of native coronary artery without angina pectoris: Secondary | ICD-10-CM | POA: Diagnosis present

## 2013-01-31 DIAGNOSIS — J9819 Other pulmonary collapse: Secondary | ICD-10-CM | POA: Diagnosis not present

## 2013-01-31 DIAGNOSIS — D649 Anemia, unspecified: Secondary | ICD-10-CM | POA: Diagnosis present

## 2013-01-31 DIAGNOSIS — Z515 Encounter for palliative care: Secondary | ICD-10-CM

## 2013-01-31 DIAGNOSIS — R4701 Aphasia: Secondary | ICD-10-CM

## 2013-01-31 DIAGNOSIS — E875 Hyperkalemia: Secondary | ICD-10-CM | POA: Diagnosis present

## 2013-01-31 DIAGNOSIS — J90 Pleural effusion, not elsewhere classified: Secondary | ICD-10-CM

## 2013-01-31 DIAGNOSIS — E871 Hypo-osmolality and hyponatremia: Secondary | ICD-10-CM | POA: Diagnosis present

## 2013-01-31 HISTORY — DX: Cerebral infarction, unspecified: I63.9

## 2013-01-31 HISTORY — DX: Dysphasia: R47.02

## 2013-01-31 LAB — POCT I-STAT 3, ART BLOOD GAS (G3+)
Acid-Base Excess: 6 mmol/L — ABNORMAL HIGH (ref 0.0–2.0)
Bicarbonate: 32.6 mEq/L — ABNORMAL HIGH (ref 20.0–24.0)
Patient temperature: 98.6
TCO2: 34 mmol/L (ref 0–100)
pO2, Arterial: 77 mmHg — ABNORMAL LOW (ref 80.0–100.0)

## 2013-01-31 LAB — CBC WITH DIFFERENTIAL/PLATELET
Eosinophils Absolute: 0 10*3/uL (ref 0.0–0.7)
Hemoglobin: 9.8 g/dL — ABNORMAL LOW (ref 13.0–17.0)
Lymphocytes Relative: 3 % — ABNORMAL LOW (ref 12–46)
Lymphs Abs: 0.4 10*3/uL — ABNORMAL LOW (ref 0.7–4.0)
MCH: 26 pg (ref 26.0–34.0)
Monocytes Relative: 11 % (ref 3–12)
Neutro Abs: 12.2 10*3/uL — ABNORMAL HIGH (ref 1.7–7.7)
Neutrophils Relative %: 86 % — ABNORMAL HIGH (ref 43–77)
RBC: 3.77 MIL/uL — ABNORMAL LOW (ref 4.22–5.81)
WBC: 14.1 10*3/uL — ABNORMAL HIGH (ref 4.0–10.5)

## 2013-01-31 LAB — PRO B NATRIURETIC PEPTIDE: Pro B Natriuretic peptide (BNP): 1136 pg/mL — ABNORMAL HIGH (ref 0–450)

## 2013-01-31 LAB — COMPREHENSIVE METABOLIC PANEL
ALT: 21 U/L (ref 0–53)
Alkaline Phosphatase: 109 U/L (ref 39–117)
BUN: 26 mg/dL — ABNORMAL HIGH (ref 6–23)
CO2: 33 mEq/L — ABNORMAL HIGH (ref 19–32)
Chloride: 88 mEq/L — ABNORMAL LOW (ref 96–112)
GFR calc Af Amer: 89 mL/min — ABNORMAL LOW (ref >90)
GFR calc non Af Amer: 77 mL/min — ABNORMAL LOW (ref >90)
Glucose, Bld: 132 mg/dL — ABNORMAL HIGH (ref 70–99)
Potassium: 5.2 mEq/L — ABNORMAL HIGH (ref 3.5–5.1)
Sodium: 128 mEq/L — ABNORMAL LOW (ref 135–145)
Total Bilirubin: 0.3 mg/dL (ref 0.3–1.2)

## 2013-01-31 LAB — PHOSPHORUS: Phosphorus: 4.5 mg/dL (ref 2.3–4.6)

## 2013-01-31 LAB — CBC
HCT: 34.3 % — ABNORMAL LOW (ref 39.0–52.0)
Hemoglobin: 10.7 g/dL — ABNORMAL LOW (ref 13.0–17.0)
MCH: 26.2 pg (ref 26.0–34.0)
MCHC: 31.2 g/dL (ref 30.0–36.0)
MCV: 84.1 fL (ref 78.0–100.0)
Platelets: 364 10*3/uL (ref 150–400)
RBC: 4.08 MIL/uL — ABNORMAL LOW (ref 4.22–5.81)
RDW: 14.7 % (ref 11.5–15.5)
WBC: 16.1 10*3/uL — ABNORMAL HIGH (ref 4.0–10.5)

## 2013-01-31 LAB — MRSA PCR SCREENING: MRSA by PCR: NEGATIVE

## 2013-01-31 LAB — STREP PNEUMONIAE URINARY ANTIGEN: Strep Pneumo Urinary Antigen: NEGATIVE

## 2013-01-31 LAB — PROCALCITONIN: Procalcitonin: 1.07 ng/mL

## 2013-01-31 LAB — URINALYSIS, ROUTINE W REFLEX MICROSCOPIC
Glucose, UA: NEGATIVE mg/dL
Hgb urine dipstick: NEGATIVE
Ketones, ur: NEGATIVE mg/dL
Protein, ur: 30 mg/dL — AB
pH: 5 (ref 5.0–8.0)

## 2013-01-31 LAB — GLUCOSE, CAPILLARY: Glucose-Capillary: 141 mg/dL — ABNORMAL HIGH (ref 70–99)

## 2013-01-31 LAB — POCT I-STAT TROPONIN I: Troponin i, poc: 0.02 ng/mL (ref 0.00–0.08)

## 2013-01-31 LAB — CREATININE, SERUM
Creatinine, Ser: 0.87 mg/dL (ref 0.50–1.35)
GFR calc non Af Amer: 79 mL/min — ABNORMAL LOW (ref >90)

## 2013-01-31 LAB — URINE MICROSCOPIC-ADD ON

## 2013-01-31 LAB — MAGNESIUM: Magnesium: 2.3 mg/dL (ref 1.5–2.5)

## 2013-01-31 MED ORDER — GUAIFENESIN 100 MG/5ML PO SOLN
5.0000 mL | Freq: Four times a day (QID) | ORAL | Status: DC
  Administered 2013-01-31 – 2013-02-04 (×16): 100 mg
  Filled 2013-01-31 (×20): qty 5

## 2013-01-31 MED ORDER — ENOXAPARIN SODIUM 40 MG/0.4ML ~~LOC~~ SOLN
40.0000 mg | SUBCUTANEOUS | Status: DC
  Administered 2013-01-31 – 2013-02-05 (×6): 40 mg via SUBCUTANEOUS
  Filled 2013-01-31 (×8): qty 0.4

## 2013-01-31 MED ORDER — INFLUENZA VIRUS VACC SPLIT PF IM SUSP
0.5000 mL | INTRAMUSCULAR | Status: AC
  Filled 2013-01-31: qty 0.5

## 2013-01-31 MED ORDER — PANTOPRAZOLE SODIUM 40 MG IV SOLR
40.0000 mg | INTRAVENOUS | Status: DC
  Administered 2013-01-31 – 2013-02-05 (×6): 40 mg via INTRAVENOUS
  Filled 2013-01-31 (×7): qty 40

## 2013-01-31 MED ORDER — VANCOMYCIN HCL 10 G IV SOLR
1250.0000 mg | INTRAVENOUS | Status: DC
  Administered 2013-01-31 – 2013-02-01 (×2): 1250 mg via INTRAVENOUS
  Filled 2013-01-31 (×5): qty 1250

## 2013-01-31 MED ORDER — SODIUM CHLORIDE 0.9 % IV SOLN
INTRAVENOUS | Status: DC
  Administered 2013-01-31 – 2013-02-03 (×5): via INTRAVENOUS

## 2013-01-31 MED ORDER — CHLORHEXIDINE GLUCONATE 0.12 % MT SOLN
15.0000 mL | Freq: Two times a day (BID) | OROMUCOSAL | Status: DC
  Administered 2013-01-31 – 2013-02-06 (×10): 15 mL via OROMUCOSAL
  Filled 2013-01-31 (×14): qty 15

## 2013-01-31 MED ORDER — ALBUTEROL SULFATE (5 MG/ML) 0.5% IN NEBU
2.5000 mg | INHALATION_SOLUTION | RESPIRATORY_TRACT | Status: DC | PRN
  Administered 2013-02-01 – 2013-02-04 (×5): 2.5 mg via RESPIRATORY_TRACT
  Filled 2013-01-31 (×4): qty 0.5

## 2013-01-31 MED ORDER — SODIUM CHLORIDE 0.9 % IV SOLN: 250.0000 mL | INTRAVENOUS | Status: DC | PRN

## 2013-01-31 MED ORDER — INSULIN ASPART 100 UNIT/ML ~~LOC~~ SOLN
0.0000 [IU] | Freq: Three times a day (TID) | SUBCUTANEOUS | Status: DC
  Administered 2013-01-31 – 2013-02-02 (×3): 2 [IU] via SUBCUTANEOUS
  Administered 2013-02-03: 3 [IU] via SUBCUTANEOUS

## 2013-01-31 MED ORDER — BIOTENE DRY MOUTH MT LIQD
15.0000 mL | Freq: Two times a day (BID) | OROMUCOSAL | Status: DC
  Administered 2013-02-01 – 2013-02-05 (×6): 15 mL via OROMUCOSAL

## 2013-01-31 MED ORDER — ALBUTEROL SULFATE HFA 108 (90 BASE) MCG/ACT IN AERS: 4.0000 | INHALATION_SPRAY | RESPIRATORY_TRACT | Status: DC

## 2013-01-31 MED ORDER — DEXTROSE 5 % IV SOLN
2.0000 g | Freq: Two times a day (BID) | INTRAVENOUS | Status: DC
  Administered 2013-01-31 – 2013-02-06 (×11): 2 g via INTRAVENOUS
  Filled 2013-01-31 (×13): qty 2

## 2013-01-31 NOTE — H&P (Signed)
PULMONARY  / CRITICAL CARE MEDICINE  Name: Johnathan Bowman MRN: [id number] DOB: 1929-01-26    ADMISSION DATE:  02/15/2013 CONSULTATION DATE:  2/13  REFERRING MD :  A. Freida Busman; PCP [facility]    CHIEF COMPLAINT:  Hypoxic resp failure   BRIEF PATIENT DESCRIPTION:  79 year old HM w/ PMH stage IV lung cancer, s/p pleur-x (since removed), and 6 cycles of chemo rx w/ plans to re-stage in January 2014. In the mean time he had a left MCA and PCA CVA with complications which has included dysphagia and aspiration. He was discharged to SNF. Presents to ER 2/13 w/ sudden onset SOB w/cough. EMS was called. He was given NTG and placed on CPAP. On evaluation he was found to have complete opacification of the left hemi-thorax, mild leukocytosis and hyponatremia. PCCM asked to admit.   SIGNIFICANT EVENTS / STUDIES:  CT abd/pelvis 2/12: Increasing left pleural effusion. Collapse of the lingula and left lower lobe. Low attenuation nodules in the collapsed lingula likely represent metastatic nodules. Increasing effusion and lymph nodes are highly suspicious for recurrent disease. Enlarging prevascular lymph node is partially visualized, also consistent with recurrent disease.  2. Small nodular foci in the dependent right lower lobe suspicious for pneumonia or aspiration. Metastatic nodules considered less likely.  3. Uncomplicated appearance of the percutaneous gastrostomy tube. There is no abscess or evidence of soft tissue infection around the entry site. The tube as well situated in the stomach. 4. Unchanged hepatic and renal cysts. No evidence of metastatic disease to the abdomen. 5. Interval development of T8 compression fracture with 30% loss  of anterior vertebral body height. No paravertebral phlegmon to suggest acute or subacute injury.    LINES / TUBES:   CULTURES: BCX 2 2/13>>> UC 2/13>>> Urine strep 2/13>>> Urine legionella 2/13>>>  ANTIBIOTICS: Cefepime 2/13>>> vanc 2/13>>>  HISTORY OF PRESENT  ILLNESS:   79 year old HM w/ PMH stage IV lung cancer, s/p pleur-x (since removed), and 6 cycles of chemo rx w/ plans to re-stage in January 2014. In the mean time he had a left MCA and PCA CVA with complications which has included dysphagia and aspiration. He was discharged to SNF. Presents to ER 2/13 w/ sudden onset SOB w/cough. EMS was called. He was given NTG and placed on CPAP. On evaluation he was found to have complete opacification of the left hemi-thorax, mild leukocytosis and hyponatremia. PCCM asked to admit.  PAST MEDICAL HISTORY :  Past Medical History  Diagnosis Date  . Hypertension   . Diabetes mellitus   . Dyslipidemia   . Mini stroke 2005  . Degenerative disc disease   . GERD (gastroesophageal reflux disease)   . Osteopenia 2005  . Coronary artery disease 2005    treated medically  . BPH (benign prostatic hyperplasia)   . DVT (deep venous thrombosis)   . History of chemotherapy   . Diverticulosis     per scan 05/28/12  . Calculus of kidney     per scan 05/28/12  . Hx of radiation therapy 06/14/12 -07/05/12    left chest  . lung ca 11/12/2010    LUL  . Stroke   . Dysphasia    Past Surgical History  Procedure Laterality Date  . No past surgeries    . Pleurex cath  02/08/11    left-sided pleural effusion   Prior to Admission medications   Medication Sig Start Date End Date Taking? Authorizing Provider  acetaminophen (TYLENOL) 500 MG tablet Take  500 mg by mouth every 6 (six) hours as needed. For pain.    Historical Provider, MD  albuterol (PROVENTIL) (2.5 MG/3ML) 0.083% nebulizer solution Take 3 mLs (2.5 mg total) by nebulization every 6 (six) hours as needed for wheezing. 09/15/12   Alison Murray, MD  amLODipine-benazepril (LOTREL) 5-10 MG per capsule Take 1 capsule by mouth daily.      Historical Provider, MD  aspirin 81 MG tablet Take 81 mg by mouth daily.     Historical Provider, MD  dexamethasone (DECADRON) 4 MG tablet Take 4 mg by mouth 2 (two) times daily with a  meal. 2 tabs by mouth BID, the day before, the day of and the day after chemotherapy    Historical Provider, MD  glyBURIDE-metformin (GLUCOVANCE) 5-500 MG per tablet Take 2 tablets by mouth 2 (two) times daily.      Historical Provider, MD  HYDROcodone-acetaminophen (VICODIN) 5-500 MG per tablet Take 1-2 tablets by mouth every 6 (six) hours as needed for pain. For pain. 09/15/12   Alison Murray, MD  hydrOXYzine (ATARAX/VISTARIL) 25 MG tablet Take 1 tablet (25 mg total) by mouth every 4 (four) hours as needed for itching. Take 1 tablet by mouth every 8 hours as needed for itching 09/15/12   Alison Murray, MD  hydrOXYzine (ATARAX/VISTARIL) 25 MG tablet TAKE 1 TABLET BY MOUTH EVERY 8 HOURS AS NEEDED FOR ITCHING 10/26/12   Conni Slipper, PA  lidocaine-prilocaine (EMLA) cream Apply 1 application topically as needed. Apply to [facility] A Cath site before chemotherapy.  02/14/11   Si Gaul, MD  morphine (MS CONTIN) 30 MG 12 hr tablet Take 1 tablet (30 mg total) by mouth 2 (two) times daily. 09/15/12   Alison Murray, MD  pantoprazole (PROTONIX) 40 MG tablet Take 1 tablet (40 mg total) by mouth 2 (two) times daily. 02/15/12   Conni Slipper, PA  PRESCRIPTION MEDICATION Chemo Medication:  Taxotere  Physician is Dr. Tanja Port.    Historical Provider, MD   Allergies  Allergen Reactions  . Penicillins Other (See Comments)    Unknown childhood allergy    FAMILY HISTORY:  No family history on file. SOCIAL HISTORY:  reports that he quit smoking about 27 years ago. He has never used smokeless tobacco. He reports that he does not drink alcohol or use illicit drugs.  REVIEW OF SYSTEMS:   Unable   SUBJECTIVE:  Feels a little better  VITAL SIGNS: Pulse Rate:  [112-124] 117 (02/13 1430) Resp:  [17-24] 20 (02/13 1430) BP: (123-138)/(77-86) 132/79 mmHg (02/13 1430) SpO2:  [78 %-100 %] 95 % (02/13 1430) FiO2 (%):  [100 %] 100 % (02/13 1430) HEMODYNAMICS:   VENTILATOR SETTINGS: Vent Mode:  [-]  FiO2  (%):  [100 %] 100 % INTAKE / OUTPUT: Intake/Output   None     PHYSICAL EXAMINATION: General:  Chronically ill appering Male, no acute distress  Neuro:  Awake, has chronic aphasia, points  HEENT:  [place] no JVD  Cardiovascular:  rrr Lungs:  Decreased on left  Abdomen:  Soft, not tender  Musculoskeletal:  C/o back pain  Skin:  Intact   LABS:  Recent Labs Lab 02/05/2013 1201 02/08/2013 1204 02/09/2013 1210  HGB 9.8*  --   --   WBC 14.1*  --   --   PLT 367  --   --   NA 128*  --   --   K 5.2*  --   --   CL 88*  --   --  CO2 33*  --   --   GLUCOSE 132*  --   --   BUN 26*  --   --   CREATININE 0.92  --   --   CALCIUM 9.6  --   --   AST 17  --   --   ALT 21  --   --   ALKPHOS 109  --   --   BILITOT 0.3  --   --   PROT 7.6  --   --   ALBUMIN 2.9*  --   --   INR 1.14  --   --   PROBNP  --  1136.0*  --   PHART  --   --  7.397  PCO2ART  --   --  53.0*  PO2ART  --   --  77.0*   No results found for this basename: GLUCAP,  in the last 168 hours  CXR: complete opacification of left hemithorax   ASSESSMENT / PLAN:  PULMONARY A: acute respiratory failure Think that this is likely due to complete opacification of the left hemithorax is due to mucous plugging, lung collapse +/- aspiration vs HCAP. We looked at a bedside US and there is very little pleural fluid, certainly not enough to explain the left sided opacification, no role for thoracentesis  P:   Change CPAP to PRN Add pulm hygiene  Wean FIO2 See ID section   CARDIOVASCULAR A: SIRS, possible sepsis  ST P:  Admit to tele  rx dyspnea Not candidate for EGDT protocol   RENAL A:   Hyponatremia: prob d/t cancer Hyperkalemia   P:   Keep even fluid status Recheck Na in am  Stop HCTZ  GASTROINTESTINAL A:   Dysphagia  P:   NPO  PEG feeds  HEMATOLOGIC A:   Anemia, no evidence of bleeding  Stage IV lung cancer. CT abd obtained for uncertain reasons on 2/12 suggest reccurent disease.  P:  Trend CBC LMWH   Think he needs palliative care consult   INFECTIOUS A:  SIRS, possible HCAP, but favor this finding on CT abd/ and CXR suggest effusion and compression atx.  P:   Culture  Empiric HCAP coverage   ENDOCRINE A:  DM w/ hyperglycemia  P:   ssi protocol   NEUROLOGIC A:  H/o left MCA CVA P:   supportive care   TODAY'S SUMMARY:  79 year old male who presents today w/ acute resp failure in what looks like recurrence/ progression of known underlying stage IV lung cancer. Needs aggressive pulm toilet and palliative care consult, possible therapeutic thora.   I have personally obtained a history, examined the patient, evaluated laboratory and imaging results, formulated the assessment and plan and placed orders. CRITICAL CARE: The patient is critically ill with multiple organ systems failure and requires high complexity decision making for assessment and support, frequent evaluation and titration of therapies, application of advanced monitoring technologies and extensive interpretation of multiple databases. Critical Care Time devoted to patient care services described in this note is 60 minutes.    Levy Pupa, MD, PhD February 27, 2013, 4:05 PM [facility] [phone] or if no answer [phone]

## 2013-01-31 NOTE — ED Provider Notes (Addendum)
History     CSN: [id number]  Arrival date & time 02-13-13  1153   First MD Initiated Contact with Patient 2013-02-13 1159      No chief complaint on file.   (Consider location/radiation/quality/duration/timing/severity/associated sxs/prior treatment) The history is provided by the patient and the EMS personnel. The history is limited by the condition of the patient.   Patient here with acute sudden onset of shortness of breath at the nursing home. Patient has been coughing and EMS was called. Upon arrival patient was in respiratory distress as well as had diffuse rhonchi. He was given nitroglycerin and placed on CPAP. His symptoms improved and he was transported here Past Medical History  Diagnosis Date  . Hypertension   . Diabetes mellitus   . Dyslipidemia   . Mini stroke 2005  . Degenerative disc disease   . GERD (gastroesophageal reflux disease)   . Osteopenia 2005  . Coronary artery disease 2005    treated medically  . BPH (benign prostatic hyperplasia)   . DVT (deep venous thrombosis)   . History of chemotherapy   . Diverticulosis     per scan 05/28/12  . Calculus of kidney     per scan 05/28/12  . Hx of radiation therapy 06/14/12 -07/05/12    left chest  . lung ca 11/12/2010    LUL    Past Surgical History  Procedure Laterality Date  . No past surgeries    . Pleurex cath  02/08/11    left-sided pleural effusion    No family history on file.  History  Substance Use Topics  . Smoking status: Former Smoker -- 3.00 packs/day for 25 years    Quit date: 01/17/1986  . Smokeless tobacco: Never Used  . Alcohol Use: No      Review of Systems  Unable to perform ROS   Allergies  Penicillins  Home Medications   Current Outpatient Rx  Name  Route  Sig  Dispense  Refill  . acetaminophen (TYLENOL) 500 MG tablet   Oral   Take 500 mg by mouth every 6 (six) hours as needed. For pain.         Marland Kitchen albuterol (PROVENTIL) (2.5 MG/3ML) 0.083% nebulizer solution    Nebulization   Take 3 mLs (2.5 mg total) by nebulization every 6 (six) hours as needed for wheezing.   75 mL   12   . amLODipine-benazepril (LOTREL) 5-10 MG per capsule   Oral   Take 1 capsule by mouth daily.           Marland Kitchen aspirin 81 MG tablet   Oral   Take 81 mg by mouth daily.          Marland Kitchen dexamethasone (DECADRON) 4 MG tablet   Oral   Take 4 mg by mouth 2 (two) times daily with a meal. 2 tabs by mouth BID, the day before, the day of and the day after chemotherapy         . glyBURIDE-metformin (GLUCOVANCE) 5-500 MG per tablet   Oral   Take 2 tablets by mouth 2 (two) times daily.           Marland Kitchen HYDROcodone-acetaminophen (VICODIN) 5-500 MG per tablet   Oral   Take 1-2 tablets by mouth every 6 (six) hours as needed for pain. For pain.   60 tablet   0   . hydrOXYzine (ATARAX/VISTARIL) 25 MG tablet   Oral   Take 1 tablet (25 mg total) by mouth every 4 (four)  hours as needed for itching. Take 1 tablet by mouth every 8 hours as needed for itching   60 tablet   0   . hydrOXYzine (ATARAX/VISTARIL) 25 MG tablet      TAKE 1 TABLET BY MOUTH EVERY 8 HOURS AS NEEDED FOR ITCHING   30 tablet   0   . lidocaine-prilocaine (EMLA) cream   Topical   Apply 1 application topically as needed. Apply to [facility] A Cath site before chemotherapy.          Marland Kitchen morphine (MS CONTIN) 30 MG 12 hr tablet   Oral   Take 1 tablet (30 mg total) by mouth 2 (two) times daily.   60 tablet   0   . pantoprazole (PROTONIX) 40 MG tablet   Oral   Take 1 tablet (40 mg total) by mouth 2 (two) times daily.   30 tablet   3   . PRESCRIPTION MEDICATION      Chemo Medication:  Taxotere  Physician is Dr. Tanja Port.           There were no vitals taken for this visit.  Physical Exam  Nursing note and vitals reviewed. Constitutional: He is oriented to person, place, and time. He appears well-developed and well-nourished.  Non-toxic appearance. No distress.  HENT:  Head: Normocephalic and atraumatic.   Eyes: Conjunctivae, EOM and lids are normal. Pupils are equal, round, and reactive to light.  Neck: Normal range of motion. Neck supple. No tracheal deviation present. No mass present.  Cardiovascular: Normal rate, regular rhythm and normal heart sounds.  Exam reveals no gallop.   No murmur heard. Pulmonary/Chest: Effort normal. No stridor. No respiratory distress. He has decreased breath sounds. He has no wheezes. He has rhonchi. He has no rales.  Abdominal: Soft. Normal appearance and bowel sounds are normal. He exhibits no distension. There is no tenderness. There is no rebound and no CVA tenderness.  Musculoskeletal: Normal range of motion. He exhibits no edema and no tenderness.  Neurological: He is alert and oriented to person, place, and time. He has normal strength. No cranial nerve deficit or sensory deficit. GCS eye subscore is 4. GCS verbal subscore is 5. GCS motor subscore is 6.  Patient is a phasic but this is his baseline  Skin: Skin is warm and dry. No abrasion and no rash noted.  Psychiatric: He has a normal mood and affect. His speech is normal and behavior is normal.    ED Course  Procedures (including critical care time)  Labs Reviewed  CBC WITH DIFFERENTIAL  COMPREHENSIVE METABOLIC PANEL  PRO B NATRIURETIC PEPTIDE  PROTIME-INR  URINALYSIS, ROUTINE W REFLEX MICROSCOPIC   Ct Abdomen Pelvis W Contrast  01/30/2013  *RADIOLOGY REPORT*  Clinical Data: Abdominal pain and distention.  Pain around percutaneous gastrostomy.  History of lung cancer.  CT ABDOMEN AND PELVIS WITH CONTRAST  Technique:  Multidetector CT imaging of the abdomen and pelvis was performed following the standard protocol during bolus administration of intravenous contrast.  Contrast: OMNIPAQUE IOHEXOL 300 MG/ML  SOLN  Comparison: CT 10/17/2012.CT chest 12/15/2012.  Findings: Increasing left pleural effusion with consolidation and collapse of visualized left lung.  There are focal areas of low density in  the lingula which is collapsed, suggesting underlying pulmonary nodules in this patient with history of lung cancer. There is nodularity of the anterior aspect the left pleural effusion adjacent to an enlarged pericardial lymph node that measures 11 mm.  Partial visualization of prevascular lymph node  which is also enlarged and has increased in size compared to 10/17/2012 and 12/15/2012, now measuring 16 mm by 26 mm.  Small nodular densities are present in the right lower lobe, which may represent aspiration or pneumonia. High density material in the dependent left chest is probably postoperative or associated with talc pleurodesis.  Severe coronary artery atherosclerosis is present.  Liver is unchanged with bilobed cyst in the left hepatic lobe.  Gallbladder appears within normal limits.  Respiratory motion is present in the abdomen.  The percutaneous gastrostomy tube is in good position.  There is no extraluminal air in the abdomen.  No inflammatory changes around the percutaneous gastrostomy tube.  Adrenal glands appear normal.  Common bile duct and pancreas appear within normal limits.  IVC filter is noted.  The stomach and duodenum appear within normal limits.  Unchanged renal cysts. Renal enhancement and excretion are within normal limits.  Ureters appear normal.  Visceral atherosclerosis and abdominal aortic atherosclerosis without aneurysm.  Mild prostatomegaly.  Urinary bladder normal.  Colonic diverticulosis without diverticulitis. There is no ascites.  Lumbar spondylosis.   T8 compression fracture is present which appears more prominent than on the prior CT of 10/17/2012. There is no paravertebral phlegmon to suggest an acute compression fracture. The ascending colonic mural thickening seen on the prior exam has resolved.  IMPRESSION:  1.  Increasing left pleural effusion.  Collapse of the lingula and left lower lobe.  Low attenuation nodules in the collapsed lingula likely represent metastatic nodules.   Increasing effusion and lymph nodes are highly suspicious for recurrent disease. Enlarging prevascular lymph node is partially visualized, also consistent with recurrent disease.  2.  Small nodular foci in the dependent right lower lobe suspicious for pneumonia or aspiration.  Metastatic nodules considered less likely. 3.  Uncomplicated appearance of the percutaneous gastrostomy tube. There is no abscess or evidence of soft tissue infection around the entry site.  The tube as well situated in the stomach. 4.  Unchanged hepatic and renal cysts.  No evidence of metastatic disease to the abdomen. 5.  Interval development of T8 compression fracture with 30% loss of anterior vertebral body height.  No paravertebral phlegmon to suggest acute or subacute injury.   Original Report Authenticated By: Andreas Newport, M.D.      No diagnosis found.    MDM   Date: 01/24/2013  Rate: 122  Rhythm: sinus tachycardia  QRS Axis: normal  Intervals: normal  ST/T Wave abnormalities: nonspecific ST changes  Conduction Disutrbances:none  Narrative Interpretation:   Old EKG Reviewed: unchanged  1:41 PM Patient placed on BiPAP for his hypoxemia. Blood gas results noted. His mental status has gradually improved. He is awake and alert answering questions. Patient to be admitted by pulmonary crit of care for his mucous plug CRITICAL CARE Performed by: Toy Baker   Total critical care time: 65  Critical care time was exclusive of separately billable procedures and treating other patients.  Critical care was necessary to treat or prevent imminent or life-threatening deterioration.  Critical care was time spent personally by me on the following activities: development of treatment plan with patient and/or surrogate as well as nursing, discussions with consultants, evaluation of patient's response to treatment, examination of patient, obtaining history from patient or surrogate, ordering and performing treatments  and interventions, ordering and review of laboratory studies, ordering and review of radiographic studies, pulse oximetry and re-evaluation of patient's condition.         Toy Baker, MD 01/20/2013  1341  Toy Baker, MD 01/26/2013 1343

## 2013-01-31 NOTE — ED Notes (Signed)
Taken off Bipap & placed on NRB per MD order. Will monitor

## 2013-01-31 NOTE — ED Notes (Signed)
Family at bedside. PCCM MD talking with family. Pt resp labored, POX 88%.  RT notified to place pt back on BiPap.

## 2013-01-31 NOTE — Progress Notes (Signed)
ANTIBIOTIC CONSULT NOTE - INITIAL  Pharmacy Consult for Vancomycin/Cefepime Indication: HCAP/SIRS  Allergies  Allergen Reactions  . Penicillins Other (See Comments)    Unknown childhood allergy    Patient Measurements: Height: 5' 4.96" (165 cm) Weight: 170 lb 13.7 oz (77.5 kg) IBW/kg (Calculated) : 61.41  Vital Signs: BP: 161/88 mmHg (02/13 1530) Pulse Rate: 124 (02/13 1545) Intake/Output from previous day:   Intake/Output from this shift:    Labs:  Recent Labs  01/21/2013 1201  WBC 14.1*  HGB 9.8*  PLT 367  CREATININE 0.92   Estimated Creatinine Clearance: 60.4 ml/min (by C-G formula based on Cr of 0.92). No results found for this basename: VANCOTROUGH, VANCOPEAK, VANCORANDOM, GENTTROUGH, GENTPEAK, GENTRANDOM, TOBRATROUGH, TOBRAPEAK, TOBRARND, AMIKACINPEAK, AMIKACINTROU, AMIKACIN,  in the last 72 hours   Microbiology: No results found for this or any previous visit (from the past 720 hour(s)).  Medical History: Past Medical History  Diagnosis Date  . Hypertension   . Diabetes mellitus   . Dyslipidemia   . Mini stroke 2005  . Degenerative disc disease   . GERD (gastroesophageal reflux disease)   . Osteopenia 2005  . Coronary artery disease 2005    treated medically  . BPH (benign prostatic hyperplasia)   . DVT (deep venous thrombosis)   . History of chemotherapy   . Diverticulosis     per scan 05/28/12  . Calculus of kidney     per scan 05/28/12  . Hx of radiation therapy 06/14/12 -07/05/12    left chest  . lung ca 11/12/2010    LUL  . Stroke   . Dysphasia     Medications:   (Not in a hospital admission) Assessment: 79 y/o male patient admitted with respiratory failure requiring broad spectrum antiobiotics for r/o pneumonia and sepsis. History of penicillin allergy but has successfully taken cefepime and ceftriaxone in the past.   Goal of Therapy:  Vancomycin trough level 15-20 mcg/ml  Plan:  Vancomycin 1250mg  IV q24h, cefepime 2g Q12h, monitor  renal function and f/u c&s. Measure antibiotic drug levels at steady [address] PharmD, [place] Pager [phone] 02/13/2013,3:57 PM

## 2013-01-31 NOTE — ED Notes (Signed)
PCCM MD at bedside 

## 2013-01-31 NOTE — ED Notes (Signed)
Second blood culture obtained.

## 2013-01-31 NOTE — Progress Notes (Signed)
Utilization review completed.  P.J. Amalio Loe,RN,BSN Case Manager [phone]  

## 2013-01-31 NOTE — ED Notes (Signed)
From [facility] - staff reports SOB, foam from mouth, respiratory distress. Arrived on CPAP, given NTG x3 enroute to ED by EMS

## 2013-02-01 ENCOUNTER — Inpatient Hospital Stay (HOSPITAL_COMMUNITY): Payer: Medicare Other

## 2013-02-01 DIAGNOSIS — C3490 Malignant neoplasm of unspecified part of unspecified bronchus or lung: Secondary | ICD-10-CM

## 2013-02-01 DIAGNOSIS — J4489 Other specified chronic obstructive pulmonary disease: Secondary | ICD-10-CM

## 2013-02-01 DIAGNOSIS — R4701 Aphasia: Secondary | ICD-10-CM

## 2013-02-01 DIAGNOSIS — J449 Chronic obstructive pulmonary disease, unspecified: Secondary | ICD-10-CM

## 2013-02-01 LAB — BASIC METABOLIC PANEL
Chloride: 89 mEq/L — ABNORMAL LOW (ref 96–112)
Creatinine, Ser: 0.85 mg/dL (ref 0.50–1.35)
GFR calc Af Amer: >90 mL/min (ref >90)
GFR calc non Af Amer: 80 mL/min — ABNORMAL LOW (ref >90)
Potassium: 4.7 mEq/L (ref 3.5–5.1)

## 2013-02-01 LAB — CBC
Platelets: 334 10*3/uL (ref 150–400)
RDW: 14.6 % (ref 11.5–15.5)
WBC: 13.9 10*3/uL — ABNORMAL HIGH (ref 4.0–10.5)

## 2013-02-01 LAB — GLUCOSE, CAPILLARY
Glucose-Capillary: 100 mg/dL — ABNORMAL HIGH (ref 70–99)
Glucose-Capillary: 127 mg/dL — ABNORMAL HIGH (ref 70–99)
Glucose-Capillary: 101 mg/dL — ABNORMAL HIGH (ref 70–99)

## 2013-02-01 LAB — MAGNESIUM: Magnesium: 2.1 mg/dL (ref 1.5–2.5)

## 2013-02-01 MED ORDER — ACETYLCYSTEINE 20 % IN SOLN
3.0000 mL | RESPIRATORY_TRACT | Status: AC
  Administered 2013-02-01 (×3): 3 mL via RESPIRATORY_TRACT
  Filled 2013-02-01 (×4): qty 4

## 2013-02-01 NOTE — Progress Notes (Signed)
eLink Physician-Brief Progress Note Patient Name: Johnathan Bowman DOB: [birth year] MRN: [id number]  Date of Service  02/01/2013   HPI/Events of Note  Thick tracheal secretions - difficult to mobilize   eICU Interventions  Plan: Mucomyst nebulized q4 hours times 4 doses.   Intervention Category Minor Interventions: Routine modifications to care plan (e.g. PRN medications for pain, fever)  DETERDING,ELIZABETH 02/01/2013, 4:37 AM

## 2013-02-01 NOTE — Progress Notes (Signed)
Stopped by pt room; pt was resting. Will pass on for follow-up visit. Marjory Lies Chaplain

## 2013-02-01 NOTE — Progress Notes (Signed)
PULMONARY  / CRITICAL CARE MEDICINE  Name: MONTELL LEOPARD MRN: [id number] DOB: 1929/07/17    ADMISSION DATE:  2013/02/22 CONSULTATION DATE:  2/13  REFERRING MD :  A. Freida Busman; PCP [facility]    CHIEF COMPLAINT:  Hypoxic resp failure   BRIEF PATIENT DESCRIPTION:  79 year old HM w/ PMH stage IV lung cancer, s/p pleur-x (since removed), and 6 cycles of chemo rx w/ plans to re-stage in January 2014. In the mean time he had a left MCA and PCA CVA with complications which has included dysphagia and aspiration. He was discharged to SNF. Presents to ER 2/13 w/ sudden onset SOB w/cough. EMS was called. He was given NTG and placed on CPAP. On evaluation he was found to have complete opacification of the left hemi-thorax, mild leukocytosis and hyponatremia. PCCM asked to admit.   SIGNIFICANT EVENTS / STUDIES:  CT abd/pelvis 2/12: Increasing left pleural effusion. Collapse of the lingula and left lower lobe. Low attenuation nodules in the collapsed lingula likely represent metastatic nodules. Increasing effusion and lymph nodes are highly suspicious for recurrent disease. Enlarging prevascular lymph node is partially visualized, also consistent with recurrent disease.  2. Small nodular foci in the dependent right lower lobe suspicious for pneumonia or aspiration. Metastatic nodules considered less likely.  3. Uncomplicated appearance of the percutaneous gastrostomy tube. There is no abscess or evidence of soft tissue infection around the entry site. The tube as well situated in the stomach. 4. Unchanged hepatic and renal cysts. No evidence of metastatic disease to the abdomen. 5. Interval development of T8 compression fracture with 30% loss  of anterior vertebral body height. No paravertebral phlegmon to suggest acute or subacute injury.    LINES / TUBES:  CULTURES: BCX 2 2/13>>> UC 2/13>>> Urine strep 2/13>>> Urine legionella 2/13>>>  ANTIBIOTICS: Cefepime 2/13>>> vanc 2/13>>>  HISTORY OF PRESENT  ILLNESS:   79 year old HM w/ PMH stage IV lung cancer, s/p pleur-x (since removed), and 6 cycles of chemo rx w/ plans to re-stage in January 2014. In the mean time he had a left MCA and PCA CVA with complications which has included dysphagia and aspiration. He was discharged to SNF. Presents to ER 2/13 w/ sudden onset SOB w/cough. EMS was called. He was given NTG and placed on CPAP. On evaluation he was found to have complete opacification of the left hemi-thorax, mild leukocytosis and hyponatremia. PCCM asked to admit.   SUBJECTIVE:  Pt has been on NIPPV ever since the ED 2/13 Has cleared some tan secretions with Vibra vest and mucomyst Complete L atx on repeat CXR this am 2/14 Writing to communicate  VITAL SIGNS: Temp:  [98.2 F (36.8 C)-99.3 F (37.4 C)] 98.4 F (36.9 C) (02/14 1200) Pulse Rate:  [112-126] 119 (02/14 1200) Resp:  [18-29] 25 (02/14 1200) BP: (126-161)/(67-89) 136/78 mmHg (02/14 1200) SpO2:  [89 %-100 %] 95 % (02/14 1200) FiO2 (%):  [100 %] 100 % (02/14 0813) Weight:  [77.5 kg (170 lb 13.7 oz)-82 kg (180 lb 12.4 oz)] 82 kg (180 lb 12.4 oz) (02/14 0500) HEMODYNAMICS:   VENTILATOR SETTINGS: Vent Mode:  [-]  FiO2 (%):  [100 %] 100 % INTAKE / OUTPUT: Intake/Output     02/13 0701 - 02/14 0700 02/14 0701 - 02/15 0700   I.V. (mL/kg) 842.5 (10.3) 375 (4.6)   IV Piggyback 50    Total Intake(mL/kg) 892.5 (10.9) 375 (4.6)   Urine (mL/kg/hr) 375 600 (1)   Total Output 375 600  Net +517.5 -225        Urine Occurrence 1 x    Stool Occurrence 1 x      PHYSICAL EXAMINATION: General:  Chronically ill appering Male, no acute distress  Neuro:  Awake, has chronic aphasia, points  HEENT:  [place] no JVD  Cardiovascular:  rrr Lungs:  Decreased on left  Abdomen:  Soft, not tender  Musculoskeletal:  C/o back pain  Skin:  Intact   LABS:  Recent Labs Lab 2013-02-19 1201 Feb 19, 2013 1204 19-Feb-2013 1210 Feb 19, 2013 1556 Feb 19, 2013 1620 2013/02/19 1644 02/01/13 0512  HGB 9.8*  --   --    --   --  10.7* 9.6*  WBC 14.1*  --   --   --   --  16.1* 13.9*  PLT 367  --   --   --   --  364 334  NA 128*  --   --   --   --   --  127*  K 5.2*  --   --   --   --   --  4.7  CL 88*  --   --   --   --   --  89*  CO2 33*  --   --   --   --   --  32  GLUCOSE 132*  --   --   --   --   --  114*  BUN 26*  --   --   --   --   --  23  CREATININE 0.92  --   --  0.87  --   --  0.85  CALCIUM 9.6  --   --   --   --   --  9.4  MG  --   --   --  2.3  --   --  2.1  PHOS  --   --   --  4.5  --   --  4.1  AST 17  --   --   --   --   --   --   ALT 21  --   --   --   --   --   --   ALKPHOS 109  --   --   --   --   --   --   BILITOT 0.3  --   --   --   --   --   --   PROT 7.6  --   --   --   --   --   --   ALBUMIN 2.9*  --   --   --   --   --   --   INR 1.14  --   --   --   --   --   --   PROCALCITON  --   --   --   --  1.07  --   --   PROBNP  --  1136.0*  --   --   --   --   --   PHART  --   --  7.397  --   --   --   --   PCO2ART  --   --  53.0*  --   --   --   --   PO2ART  --   --  77.0*  --   --   --   --     Recent Labs Lab 02-19-2013 2005 02/01/13 0141 02/01/13  1610 02/01/13 0815 02/01/13 1230  GLUCAP 150* 100* 104* 127* 122*    CXR: complete opacification of left hemithorax   ASSESSMENT / PLAN:  PULMONARY A: acute respiratory failure Think that this is likely due to complete opacification of the left hemithorax is due to mucous plugging, lung collapse +/- aspiration vs HCAP. We looked at a bedside US and there is very little pleural fluid, certainly not enough to explain the left sided opacification. Some secretion clearance last 24 hours but CXR still with complete whiteout 2/14 P:   BiPAP to PRN Aggressive pulm hygiene Wean FIO2 if able Consider therapeutic thora, but believe that secretion clearance is most important thing here FOB would be helpful, but do not believe he can tolerate at this time without intubation. Could reconsider if he stabilizes Discussed frankly with  pt, wife, nephew that this may not get better. They have not wanted trach, and given this fact I would not recommend ETT. They agree and he is limited code. They understand that we will figure out in the next 24-48h whether this is a reversible process. If not then we will need to palliate.  See ID section   CARDIOVASCULAR A: SIRS, possible sepsis  ST P:  Admit to tele  rx dyspnea  RENAL A:   Hyponatremia: prob d/t cancer Hyperkalemia   P:   Keep even fluid status  Stop HCTZ Follow BMP   GASTROINTESTINAL A:   Dysphagia  P:    NPO  PEG feeds  HEMATOLOGIC A:   Anemia, no evidence of bleeding  Stage IV lung cancer. CT abd obtained for uncertain reasons on 2/12 suggest recurrent disease.  P:  Trend CBC LMWH  May benefit palliative care consult   INFECTIOUS A:  SIRS, possible HCAP, but favor this finding on CT abd/ and CXR suggest effusion and compression atx.  P:   Culture  Empiric HCAP coverage   ENDOCRINE A:  DM w/ hyperglycemia  P:   ssi protocol   NEUROLOGIC A:  H/o left MCA CVA P:   supportive care   TODAY'S SUMMARY:  79 year old male who presents today w/ acute resp failure in what looks like recurrence/ progression of known underlying stage IV lung cancer. Needs aggressive pulm toilet and palliative care consult, possible therapeutic thora.   I have personally obtained a history, examined the patient, evaluated laboratory and imaging results, formulated the assessment and plan and placed orders.  CRITICAL CARE: The patient is critically ill with multiple organ systems failure and requires high complexity decision making for assessment and support, frequent evaluation and titration of therapies, application of advanced monitoring technologies and extensive interpretation of multiple databases. Critical Care Time devoted to patient care services described in this note is 30 minutes.    Levy Pupa, MD, PhD 02/01/2013, 2:13 PM [facility] [phone] or if no answer [phone]

## 2013-02-02 ENCOUNTER — Inpatient Hospital Stay (HOSPITAL_COMMUNITY): Payer: Medicare Other

## 2013-02-02 LAB — GLUCOSE, CAPILLARY
Glucose-Capillary: 146 mg/dL — ABNORMAL HIGH (ref 70–99)
Glucose-Capillary: 124 mg/dL — ABNORMAL HIGH (ref 70–99)
Glucose-Capillary: 164 mg/dL — ABNORMAL HIGH (ref 70–99)
Glucose-Capillary: 60 mg/dL — ABNORMAL LOW (ref 70–99)

## 2013-02-02 LAB — URINE CULTURE

## 2013-02-02 LAB — CBC
MCH: 25.7 pg — ABNORMAL LOW (ref 26.0–34.0)
MCHC: 30.2 g/dL (ref 30.0–36.0)
Platelets: 387 10*3/uL (ref 150–400)
RDW: 14.8 % (ref 11.5–15.5)

## 2013-02-02 LAB — BASIC METABOLIC PANEL
BUN: 19 mg/dL (ref 6–23)
Calcium: 9.4 mg/dL (ref 8.4–10.5)
Creatinine, Ser: 0.71 mg/dL (ref 0.50–1.35)
GFR calc non Af Amer: 86 mL/min — ABNORMAL LOW (ref >90)
Glucose, Bld: 141 mg/dL — ABNORMAL HIGH (ref 70–99)

## 2013-02-02 MED ORDER — MORPHINE SULFATE 4 MG/ML IJ SOLN
4.0000 mg | INTRAMUSCULAR | Status: DC | PRN
  Administered 2013-02-02 – 2013-02-06 (×9): 4 mg via INTRAVENOUS
  Filled 2013-02-02 (×9): qty 1

## 2013-02-02 MED ORDER — FUROSEMIDE 10 MG/ML IJ SOLN
40.0000 mg | Freq: Once | INTRAMUSCULAR | Status: AC
  Administered 2013-02-03: 40 mg via INTRAVENOUS
  Filled 2013-02-02: qty 4

## 2013-02-02 MED ORDER — FUROSEMIDE 10 MG/ML IJ SOLN
20.0000 mg | Freq: Once | INTRAMUSCULAR | Status: AC
  Administered 2013-02-02: 20 mg via INTRAVENOUS
  Filled 2013-02-02: qty 2

## 2013-02-02 MED ORDER — DEXTROSE 50 % IV SOLN
INTRAVENOUS | Status: AC
  Administered 2013-02-02: 50 mL via INTRAVENOUS
  Filled 2013-02-02: qty 50

## 2013-02-02 MED ORDER — DEXTROSE 50 % IV SOLN
50.0000 mL | Freq: Once | INTRAVENOUS | Status: AC | PRN
  Administered 2013-02-02: 50 mL via INTRAVENOUS

## 2013-02-02 MED ORDER — CLINDAMYCIN PHOSPHATE 900 MG/50ML IV SOLN
900.0000 mg | Freq: Three times a day (TID) | INTRAVENOUS | Status: DC
  Administered 2013-02-02 – 2013-02-04 (×7): 900 mg via INTRAVENOUS
  Filled 2013-02-02 (×11): qty 50

## 2013-02-02 MED ORDER — VANCOMYCIN HCL IN DEXTROSE 1-5 GM/200ML-% IV SOLN
1000.0000 mg | Freq: Two times a day (BID) | INTRAVENOUS | Status: DC
  Administered 2013-02-02 – 2013-02-05 (×7): 1000 mg via INTRAVENOUS
  Filled 2013-02-02 (×8): qty 200

## 2013-02-02 NOTE — Progress Notes (Signed)
PULMONARY  / CRITICAL CARE MEDICINE  Name: Johnathan Bowman MRN: [id number] DOB: [birth year]    ADMISSION DATE:  02/11/2013 CONSULTATION DATE:  2/13  REFERRING MD :  A. Freida Busman; PCP [facility]    CHIEF COMPLAINT:  Hypoxic resp failure   BRIEF PATIENT DESCRIPTION:  79 year old HM w/ PMH stage IV lung cancer, s/p pleur-x (since removed), and 6 cycles of chemo rx w/ plans to re-stage in January 2014. In the mean time he had a left MCA and PCA CVA with complications which has included dysphagia and aspiration. He was discharged to SNF. Presents to ER 2/13 w/ sudden onset SOB w/cough. EMS was called. He was given NTG and placed on CPAP. On evaluation he was found to have complete opacification of the left hemi-thorax, mild leukocytosis and hyponatremia. PCCM asked to admit.   SIGNIFICANT EVENTS / STUDIES:  CT abd/pelvis 2/12: Increasing left pleural effusion. Collapse of the lingula and left lower lobe. Low attenuation nodules in the collapsed lingula likely represent metastatic nodules. Increasing effusion and lymph nodes are highly suspicious for recurrent disease. Enlarging prevascular lymph node is partially visualized, also consistent with recurrent disease.  2. Small nodular foci in the dependent right lower lobe suspicious for pneumonia or aspiration. Metastatic nodules considered less likely.  3. Uncomplicated appearance of the percutaneous gastrostomy tube. There is no abscess or evidence of soft tissue infection around the entry site. The tube as well situated in the stomach. 4. Unchanged hepatic and renal cysts. No evidence of metastatic disease to the abdomen. 5. Interval development of T8 compression fracture with 30% loss  of anterior vertebral body height. No paravertebral phlegmon to suggest acute or subacute injury.    LINES / TUBES:  CULTURES: BCX 2 2/13>>> UC 2/13>>> Urine strep 2/13>>> Urine legionella 2/13>>>  ANTIBIOTICS: Cefepime 2/13>>> vanc 2/13>>> clinda  2/15>>  HISTORY OF PRESENT ILLNESS:   79 year old HM w/ PMH stage IV lung cancer, s/p pleur-x (since removed), and 6 cycles of chemo rx w/ plans to re-stage in January 2014. In the mean time he had a left MCA and PCA CVA with complications which has included dysphagia and aspiration. He was discharged to SNF. Presents to ER 2/13 w/ sudden onset SOB w/cough. EMS was called. He was given NTG and placed on CPAP. On evaluation he was found to have complete opacification of the left hemi-thorax, mild leukocytosis and hyponatremia. PCCM asked to admit.   SUBJECTIVE:  Pt has been on NIPPV ever since the ED 2/13 Developed low grade temp last night, and has some increased wob this am despite 100% bipap with adequate tv greater than 400cc.  His cxr shows developing infiltrate in right lung, with persistent opacification of left lung.     VITAL SIGNS: Temp:  [98.4 F (36.9 C)-100.3 F (37.9 C)] 100.3 F (37.9 C) (02/15 0802) Pulse Rate:  [119-138] 129 (02/15 0857) Resp:  [25-33] 31 (02/15 0857) BP: (136-163)/(78-95) 156/84 mmHg (02/15 0857) SpO2:  [74 %-95 %] 84 % (02/15 0859) FiO2 (%):  [100 %] 100 % (02/15 0859) Weight:  [82.4 kg (181 lb 10.5 oz)] 82.4 kg (181 lb 10.5 oz) (02/14 2349) HEMODYNAMICS:   VENTILATOR SETTINGS: Vent Mode:  [-]  FiO2 (%):  [100 %] 100 % INTAKE / OUTPUT: Intake/Output     02/14 0701 - 02/15 0700 02/15 0701 - 02/16 0700   I.V. (mL/kg) 1425 (17.3) 225 (2.7)   Other 60    IV Piggyback 500  Total Intake(mL/kg) 1985 (24.1) 225 (2.7)   Urine (mL/kg/hr) 900 (0.5) 225 (0.6)   Total Output 900 225   Net +1085 0        Urine Occurrence 1 x      PHYSICAL EXAMINATION: General:  Chronically ill appering Male, no acute distress  Neuro:  Awake, has chronic aphasia, points and nods HEENT:  cpap mask in place, no significant air leaks Cardiovascular: regular tachy Lungs:  Decreased on left, and crackles on right.  No wheezing Abdomen:  Soft, not tender   Musculoskeletal:  C/o back pain and left side pain.  Skin:  Intact  LE without significant edema, no cyanosis  LABS:  Recent Labs Lab 02-08-13 1201 02/08/2013 1204 08-Feb-2013 1210 2013-02-08 1556 2013/02/08 1620 Feb 08, 2013 1644 02/01/13 0512 02/02/13 0500  HGB 9.8*  --   --   --   --  10.7* 9.6* 9.6*  WBC 14.1*  --   --   --   --  16.1* 13.9* 13.7*  PLT 367  --   --   --   --  364 334 387  NA 128*  --   --   --   --   --  127* 133*  K 5.2*  --   --   --   --   --  4.7 4.6  CL 88*  --   --   --   --   --  89* 94*  CO2 33*  --   --   --   --   --  32 27  GLUCOSE 132*  --   --   --   --   --  114* 141*  BUN 26*  --   --   --   --   --  23 19  CREATININE 0.92  --   --  0.87  --   --  0.85 0.71  CALCIUM 9.6  --   --   --   --   --  9.4 9.4  MG  --   --   --  2.3  --   --  2.1  --   PHOS  --   --   --  4.5  --   --  4.1  --   AST 17  --   --   --   --   --   --   --   ALT 21  --   --   --   --   --   --   --   ALKPHOS 109  --   --   --   --   --   --   --   BILITOT 0.3  --   --   --   --   --   --   --   PROT 7.6  --   --   --   --   --   --   --   ALBUMIN 2.9*  --   --   --   --   --   --   --   INR 1.14  --   --   --   --   --   --   --   PROCALCITON  --   --   --   --  1.07  --   --   --   PROBNP  --  1136.0*  --   --   --   --   --   --  PHART  --   --  7.397  --   --   --   --   --   PCO2ART  --   --  53.0*  --   --   --   --   --   PO2ART  --   --  77.0*  --   --   --   --   --     Recent Labs Lab 02/01/13 1845 02/01/13 2019 02/01/13 2348 02/02/13 0505 02/02/13 0805  GLUCAP 101* 105* 110* 127* 146*      ASSESSMENT / PLAN:  PULMONARY A: acute respiratory failure Think that this is likely due to complete opacification of the left hemithorax is due to mucous plugging, lung collapse +/- aspiration vs HCAP. We looked at a bedside US and there is very little pleural fluid, certainly not enough to explain the left sided opacification.  Continues to have opacification  left chest on xray today, and now developing what I suspect is pna in the right lung.  Probable aspiration? P:   Continue bilevel support.  The pt does not wish to be intubated. Aggressive pulm hygiene Will increase narcotics to help with discomfort and wob.   GASTROINTESTINAL A:   Dysphagia  P:    NPO  PEG feeds  HEMATOLOGIC A:   Anemia, no evidence of bleeding  Stage IV lung cancer. CT abd obtained for uncertain reasons on 2/12 suggest recurrent disease.  P:  Trend CBC LMWH    INFECTIOUS A:  Unclear whether HCAP in left lung, but now has infiltrate in right lung with low grade temps.  Suspect this is aspiration given his history P:   F/u  cultures  Add clinda to current abx coverage considering pcn allergy   ENDOCRINE A:  DM w/ hyperglycemia  P:   ssi protocol   NEUROLOGIC A:  H/o left MCA CVA P:   supportive care

## 2013-02-02 NOTE — Progress Notes (Signed)
SPO2 ranging 82-88% noted limited code, no CPR no mechanical vent/intubation.Respiratory therapist and El[facility] doctor made aware.

## 2013-02-02 NOTE — Progress Notes (Signed)
ANTIBIOTIC CONSULT NOTE - INITIAL  Pharmacy Consult for Vancomycin/Cefepime Indication: HCAP  Allergies  Allergen Reactions  . Penicillins Other (See Comments)    Unknown childhood allergy    Patient Measurements: Height: 5' 4.96" (165 cm) Weight: 181 lb 10.5 oz (82.4 kg) IBW/kg (Calculated) : 61.41  Vital Signs: Temp: 98 F (36.7 C) (02/15 1245) Temp src: Axillary (02/15 1245) BP: 138/80 mmHg (02/15 1245) Pulse Rate: 124 (02/15 1245) Intake/Output from previous day: 02/14 0701 - 02/15 0700 In: 1985 [I.V.:1425; IV Piggyback:500] Out: 900 [Urine:900] Intake/Output from this shift: Total I/O In: 225 [I.V.:225] Out: 225 [Urine:225]  Labs:  Recent Labs  01/28/2013 1556 02/02/2013 1644 02/01/13 0512 02/02/13 0500  WBC  --  16.1* 13.9* 13.7*  HGB  --  10.7* 9.6* 9.6*  PLT  --  364 334 387  CREATININE 0.87  --  0.85 0.71   Estimated Creatinine Clearance: 71.5 ml/min (by C-G formula based on Cr of 0.71). No results found for this basename: VANCOTROUGH, Leodis Binet, VANCORANDOM, GENTTROUGH, GENTPEAK, GENTRANDOM, TOBRATROUGH, TOBRAPEAK, TOBRARND, AMIKACINPEAK, AMIKACINTROU, AMIKACIN,  in the last 72 hours   Microbiology: Recent Results (from the past 720 hour(s))  CULTURE, BLOOD (ROUTINE X 2)     Status: None   Collection Time    02/14/2013  4:07 PM      Result Value Range Status   Specimen Description BLOOD HAND RIGHT   Final   Special Requests BOTTLES DRAWN AEROBIC ONLY 3CC   Final   Culture  Setup Time 02/01/2013 05:23   Final   Culture     Final   Value:        BLOOD CULTURE RECEIVED NO GROWTH TO DATE CULTURE WILL BE HELD FOR 5 DAYS BEFORE ISSUING A FINAL NEGATIVE REPORT   Report Status PENDING   Incomplete  CULTURE, BLOOD (ROUTINE X 2)     Status: None   Collection Time    02/09/2013  4:50 PM      Result Value Range Status   Specimen Description BLOOD RIGHT ARM   Final   Special Requests BOTTLES DRAWN AEROBIC AND ANAEROBIC 10CC   Final   Culture  Setup Time  02/01/2013 05:21   Final   Culture     Final   Value:        BLOOD CULTURE RECEIVED NO GROWTH TO DATE CULTURE WILL BE HELD FOR 5 DAYS BEFORE ISSUING A FINAL NEGATIVE REPORT   Report Status PENDING   Incomplete  URINE CULTURE     Status: None   Collection Time    02/10/2013  6:06 PM      Result Value Range Status   Specimen Description URINE, CLEAN CATCH   Final   Special Requests NONE   Final   Culture  Setup Time 02/02/2013 20:04   Final   Colony Count NO GROWTH   Final   Culture NO GROWTH   Final   Report Status 02/02/2013 FINAL   Final  MRSA PCR SCREENING     Status: None   Collection Time    01/24/2013  6:40 PM      Result Value Range Status   MRSA by PCR NEGATIVE  NEGATIVE Final   Comment:            The GeneXpert MRSA Assay (FDA     approved for NASAL specimens     only), is one component of a     comprehensive MRSA colonization     surveillance program. It is not  intended to diagnose MRSA     infection nor to guide or     monitor treatment for     MRSA infections.    Medical History: Past Medical History  Diagnosis Date  . Hypertension   . Diabetes mellitus   . Dyslipidemia   . Mini stroke 2005  . Degenerative disc disease   . GERD (gastroesophageal reflux disease)   . Osteopenia 2005  . Coronary artery disease 2005    treated medically  . BPH (benign prostatic hyperplasia)   . DVT (deep venous thrombosis)   . History of chemotherapy   . Diverticulosis     per scan 05/28/12  . Calculus of kidney     per scan 05/28/12  . Hx of radiation therapy 06/14/12 -07/05/12    left chest  . lung ca 11/12/2010    LUL  . Stroke   . Dysphasia     Medications:  Prescriptions prior to admission  Medication Sig Dispense Refill  . amLODipine (NORVASC) 5 MG tablet Give 5 mg by tube daily.      Marland Kitchen aspirin 325 MG tablet Give 325 mg by tube daily.      . citalopram (CELEXA) 10 MG tablet Give 10 mg by tube daily.      . fentaNYL (DURAGESIC - DOSED MCG/HR) 12 MCG/HR Place 1  patch onto the skin every 3 (three) days.      Marland Kitchen HYDROcodone-acetaminophen (NORCO/VICODIN) 5-325 MG per tablet Take 1 tablet by mouth every 6 (six) hours as needed for pain.      Marland Kitchen insulin glargine (LANTUS) 100 UNIT/ML injection Inject 15 Units into the skin 2 (two) times daily.      . insulin lispro (HUMALOG) 100 UNIT/ML injection Inject 0-11 Units into the skin 3 (three) times daily before meals. 60-100=0, 101-150=2, 151-200=3, 201-250=5, 251-300=7, 301-350=9, >350=11      . lansoprazole (PREVACID) 30 MG capsule Give 30 mg by tube daily.      . polyethylene glycol (MIRALAX / GLYCOLAX) packet Give 17 g by tube daily.       Assessment: 79 y/o male patient admitted with respiratory failure requiring broad spectrum antiobiotics for r/o pneumonia and probable aspiration. Since admission renal function has been improving so will increase vancomycin dose.  Abx 2/13 Vanc>> 2/13 Cefepime>> 2/15 Clinda>>  Cx 2/13 Urine>> NGF 2/13 Blood>> NGTD   Goal of Therapy:  Vancomycin trough level 15-20 mcg/ml  Plan:  Change vancomycin to 1000mg  IV q12h Continue cefepime 2g q12h Monitor renal function and f/u c&s. Measure antibiotic drug levels at steady state   Vania Rea. Darin Engels.D. Clinical Pharmacist Pager [phone] Phone [phone] 02/02/2013 2:56 PM

## 2013-02-02 NOTE — Progress Notes (Signed)
Patient with lung cancer, complete white L lung, limited code, no intubation, currently on BiPAP 100% FiO2. Alarmed the system with hypoxia and tachycardia.   CXR pending; Lasix 20 mg IV ordered.

## 2013-02-02 NOTE — Progress Notes (Signed)
[facility] Brief Progress  Called by RN regarding hypoxemia.  Patient comfortable but O2 saturation in 80's.  He is about 1 liter positive today, 2L in last 48 hours.  Limited code, Stage IV lung cancer  Plan: -lasix x1, KVO fluids -update family -morphine as needed for discomfort  Yolonda Kida PCCM Pager: [phone] Cell: [phone] If no response, call [phone]

## 2013-02-02 NOTE — Progress Notes (Signed)
Hypoglycemic Event  CBG: 62  Treatment: 50% dextrose 50ml  iv  Symptoms: drowsy  Follow-up CBG: Time 1720  CBG Result:164  Possible Reasons for Event:npo  Comments/MD notified:Bice    Johnathan Bowman, Johnathan Bowman  Remember to initiate Hypoglycemia Order Set & complete

## 2013-02-02 NOTE — Progress Notes (Signed)
Dr. Kendrick Fries notified of patient's oxygen desaturation to 57.  Pt's oxygen saturation sustaining at 80% on Bi-pap, FiO2 100%, without dyspnea or acute distress. Unable to contact spouse due to two non-working telephone numbers, left voicemail msg for daughter, Yehuda Savannah.  Orders obtained and initiated.  Will continue to monitor pt status and evaluate treatment effectiveness

## 2013-02-03 LAB — GLUCOSE, CAPILLARY
Glucose-Capillary: 114 mg/dL — ABNORMAL HIGH (ref 70–99)
Glucose-Capillary: 117 mg/dL — ABNORMAL HIGH (ref 70–99)
Glucose-Capillary: 125 mg/dL — ABNORMAL HIGH (ref 70–99)
Glucose-Capillary: 129 mg/dL — ABNORMAL HIGH (ref 70–99)
Glucose-Capillary: 138 mg/dL — ABNORMAL HIGH (ref 70–99)
Glucose-Capillary: 158 mg/dL — ABNORMAL HIGH (ref 70–99)

## 2013-02-03 NOTE — Progress Notes (Signed)
PULMONARY  / CRITICAL CARE MEDICINE  Name: Johnathan Bowman MRN: [id number] DOB: June 23, 1929    ADMISSION DATE:  01/27/2013 CONSULTATION DATE:  2/13  REFERRING MD :  A. Freida Busman; PCP [facility]    CHIEF COMPLAINT:  Hypoxic resp failure   BRIEF PATIENT DESCRIPTION:  79 year old HM w/ PMH stage IV lung cancer, s/p pleur-x (since removed), and 6 cycles of chemo rx w/ plans to re-stage in January 2014. In the mean time he had a left MCA and PCA CVA with complications which has included dysphagia and aspiration. He was discharged to SNF. Presents to ER 2/13 w/ sudden onset SOB w/cough. EMS was called. He was given NTG and placed on CPAP. On evaluation he was found to have complete opacification of the left hemi-thorax, mild leukocytosis and hyponatremia. PCCM asked to admit.   SIGNIFICANT EVENTS / STUDIES:  CT abd/pelvis 2/12: Increasing left pleural effusion. Collapse of the lingula and left lower lobe. Low attenuation nodules in the collapsed lingula likely represent metastatic nodules. Increasing effusion and lymph nodes are highly suspicious for recurrent disease. Enlarging prevascular lymph node is partially visualized, also consistent with recurrent disease.  2. Small nodular foci in the dependent right lower lobe suspicious for pneumonia or aspiration. Metastatic nodules considered less likely.  3. Uncomplicated appearance of the percutaneous gastrostomy tube. There is no abscess or evidence of soft tissue infection around the entry site. The tube as well situated in the stomach. 4. Unchanged hepatic and renal cysts. No evidence of metastatic disease to the abdomen. 5. Interval development of T8 compression fracture with 30% loss  of anterior vertebral body height. No paravertebral phlegmon to suggest acute or subacute injury.    LINES / TUBES:  CULTURES: BCX 2 2/13>>> UC 2/13>>> Urine strep 2/13>>> Urine legionella 2/13>>>  ANTIBIOTICS: Cefepime 2/13>>> vanc 2/13>>> clinda  2/15>>  HISTORY OF PRESENT ILLNESS:   79 year old HM w/ PMH stage IV lung cancer, s/p pleur-x (since removed), and 6 cycles of chemo rx w/ plans to re-stage in January 2014. In the mean time he had a left MCA and PCA CVA with complications which has included dysphagia and aspiration. He was discharged to SNF. Presents to ER 2/13 w/ sudden onset SOB w/cough. EMS was called. He was given NTG and placed on CPAP. On evaluation he was found to have complete opacification of the left hemi-thorax, mild leukocytosis and hyponatremia. PCCM asked to admit.   SUBJECTIVE:  Pt has been on NIPPV ever since the ED 2/13 Developed low grade temp last night, and has some increased wob this am despite 100% bipap with adequate tv greater than 400cc.  His cxr shows developing infiltrate in right lung, with persistent opacification of left lung.    2/16:  Looks comfortable this am on bilevel, but still hypoxic despite 100% and given a dose of lasix overnight.  Receiving prn morphine for discomfort.  Had episode of hypoglycemia and required D50.  Will d/c ssi with NPO.  Bilevel shows Tv of 500cc with RR in 20's.    VITAL SIGNS: Temp:  [97.8 F (36.6 C)-99.7 F (37.6 C)] 99.7 F (37.6 C) (02/16 0733) Pulse Rate:  [118-131] 118 (02/16 0800) Resp:  [20-30] 20 (02/16 0800) BP: (121-154)/(71-89) 121/76 mmHg (02/16 0800) SpO2:  [80 %-89 %] 80 % (02/16 0800) FiO2 (%):  [100 %] 100 % (02/16 0733) HEMODYNAMICS:   VENTILATOR SETTINGS: Vent Mode:  [-]  FiO2 (%):  [100 %] 100 % INTAKE / OUTPUT:  Intake/Output     02/15 0701 - 02/16 0700 02/16 0701 - 02/17 0700   I.V. (mL/kg) 1000 (12.1) 10 (0.1)   Other     NG/GT 120    IV Piggyback 250    Total Intake(mL/kg) 1370 (16.6) 10 (0.1)   Urine (mL/kg/hr) 225 (0.1)    Total Output 225     Net +1145 +10        Urine Occurrence 1302 x      PHYSICAL EXAMINATION: General:  Chronically ill appering Male, no acute distress  Neuro:  Awake, has chronic aphasia, sleepy this  am HEENT:  cpap mask in place, no significant air leaks Cardiovascular: regular tachy Lungs:  Decreased on left, and crackles on right.  No wheezing Abdomen:  Soft, not tender  LE without significant edema, no cyanosis  LABS:  Recent Labs Lab 02/03/2013 1201 02/01/2013 1204 01/24/2013 1210 06-Feb-2013 1556 02/15/2013 1620 02/15/2013 1644 02/01/13 0512 02/02/13 0500  HGB 9.8*  --   --   --   --  10.7* 9.6* 9.6*  WBC 14.1*  --   --   --   --  16.1* 13.9* 13.7*  PLT 367  --   --   --   --  364 334 387  NA 128*  --   --   --   --   --  127* 133*  K 5.2*  --   --   --   --   --  4.7 4.6  CL 88*  --   --   --   --   --  89* 94*  CO2 33*  --   --   --   --   --  32 27  GLUCOSE 132*  --   --   --   --   --  114* 141*  BUN 26*  --   --   --   --   --  23 19  CREATININE 0.92  --   --  0.87  --   --  0.85 0.71  CALCIUM 9.6  --   --   --   --   --  9.4 9.4  MG  --   --   --  2.3  --   --  2.1  --   PHOS  --   --   --  4.5  --   --  4.1  --   AST 17  --   --   --   --   --   --   --   ALT 21  --   --   --   --   --   --   --   ALKPHOS 109  --   --   --   --   --   --   --   BILITOT 0.3  --   --   --   --   --   --   --   PROT 7.6  --   --   --   --   --   --   --   ALBUMIN 2.9*  --   --   --   --   --   --   --   INR 1.14  --   --   --   --   --   --   --   PROCALCITON  --   --   --   --  1.07  --   --   --   PROBNP  --  1136.0*  --   --   --   --   --   --   PHART  --   --  7.397  --   --   --   --   --   PCO2ART  --   --  53.0*  --   --   --   --   --   PO2ART  --   --  77.0*  --   --   --   --   --     Recent Labs Lab 02/02/13 2042 02/03/13 0021 02/03/13 0421 02/03/13 0826 02/03/13 1038  GLUCAP 117* 124* 149* 177* 158*      ASSESSMENT / PLAN:  PULMONARY A: acute respiratory failure Think  this is likely due to complete opacification of the left hemithorax, which is due to mucous plugging, lung collapse +/- aspiration vs HCAP.  Bedside US and there is very little pleural fluid,  certainly not enough to explain the left sided opacification.  Continues to have opacification left chest on xray today, and now developing what I suspect is pna in the right lung.  Probable aspiration? +I/O but no po intake since admit.  Given dose of lasix, but I/O inaccurate to know response.  I suspect this is not edema.  Is now on broad spectrum abx to cover HCAP and aspiration.  Prognosis is poor.   P:   Continue bilevel support.  The pt does not wish to be intubated. Aggressive pulm hygiene Will increase narcotics to help with discomfort and wob.  Will keep ivf at [facility] for now, but with no po intake may need to restart maintenance IVF.  GASTROINTESTINAL A:   Dysphagia  P:    NPO   HEMATOLOGIC A:   Anemia, no evidence of bleeding  Stage IV lung cancer. CT abd obtained for uncertain reasons on 2/12 suggest recurrent disease.  P:  Trend CBC LMWH    INFECTIOUS A:  Unclear whether HCAP in left lung, but now has infiltrate in right lung with low grade temps.  Suspect this is aspiration given his history P:   F/u  cultures  Add clinda to current abx coverage considering pcn allergy   ENDOCRINE A:  DM w/ hyperglycemia  P:   Pt had episode of hypoglycemia overnight, and will therefore d/c ssi and only cover if CBG greater than 200.  NEUROLOGIC A:  H/o left MCA CVA P:   supportive care

## 2013-02-03 NOTE — Progress Notes (Signed)
Resp Care Note; Pt refusing Chest vest at this time.

## 2013-02-04 LAB — GLUCOSE, CAPILLARY
Glucose-Capillary: 187 mg/dL — ABNORMAL HIGH (ref 70–99)
Glucose-Capillary: 149 mg/dL — ABNORMAL HIGH (ref 70–99)

## 2013-02-04 LAB — CBC
MCV: 85.1 fL (ref 78.0–100.0)
Platelets: 348 10*3/uL (ref 150–400)
RBC: 3.69 MIL/uL — ABNORMAL LOW (ref 4.22–5.81)
RDW: 15.2 % (ref 11.5–15.5)
WBC: 12.3 10*3/uL — ABNORMAL HIGH (ref 4.0–10.5)

## 2013-02-04 LAB — BASIC METABOLIC PANEL
CO2: 29 mEq/L (ref 19–32)
Chloride: 92 mEq/L — ABNORMAL LOW (ref 96–112)
GFR calc Af Amer: >90 mL/min (ref >90)
Potassium: 4.2 mEq/L (ref 3.5–5.1)
Sodium: 135 mEq/L (ref 135–145)

## 2013-02-04 NOTE — Progress Notes (Signed)
Patient is alert and oriented and very cooperative.  He is able to turn and pull himself if instructed to do so.  Denies of pain.  He remains on BiPap this shift.

## 2013-02-04 NOTE — Progress Notes (Signed)
Pt is on BiPAP, IPAP 16 EPAP 8 and 100% oxygen. HR 117 RR 27 and oxygen saturation is 89%. RT notified RN and RN to notify MD.

## 2013-02-04 NOTE — Progress Notes (Signed)
PULMONARY  / CRITICAL CARE MEDICINE  Name: Johnathan Bowman MRN: [id number] DOB: 17-Jul-1929    ADMISSION DATE:  01/20/2013 CONSULTATION DATE:  2/13  REFERRING MD :  A. Freida Busman; PCP [facility]    CHIEF COMPLAINT:  Hypoxic resp failure   BRIEF PATIENT DESCRIPTION:  79 year old HM w/ PMH stage IV lung cancer, s/p pleur-x (since removed), and 6 cycles of chemo rx w/ plans to re-stage in January 2014. In the mean time he had a left MCA and PCA CVA with complications which has included dysphagia and aspiration. He was discharged to SNF. Presents to ER 2/13 w/ sudden onset SOB w/cough. EMS was called. He was given NTG and placed on CPAP. On evaluation he was found to have complete opacification of the left hemi-thorax, mild leukocytosis and hyponatremia. PCCM asked to admit.   SIGNIFICANT EVENTS / STUDIES:  CT abd/pelvis 2/12: Increasing left pleural effusion. Collapse of the lingula and left lower lobe. Low attenuation nodules in the collapsed lingula likely represent metastatic nodules. Increasing effusion and lymph nodes are highly suspicious for recurrent disease. Enlarging prevascular lymph node is partially visualized, also consistent with recurrent disease.  2. Small nodular foci in the dependent right lower lobe suspicious for pneumonia or aspiration. Metastatic nodules considered less likely.  3. Uncomplicated appearance of the percutaneous gastrostomy tube. There is no abscess or evidence of soft tissue infection around the entry site. The tube as well situated in the stomach. 4. Unchanged hepatic and renal cysts. No evidence of metastatic disease to the abdomen. 5. Interval development of T8 compression fracture with 30% loss  of anterior vertebral body height. No paravertebral phlegmon to suggest acute or subacute injury.    LINES / TUBES:  CULTURES: BCX 2 2/13>>> UC 2/13>>> Urine strep 2/13>>> Urine legionella 2/13>>>  ANTIBIOTICS: Cefepime 2/13>>> vanc 2/13>>> clinda 2/15>>  2/17   SUBJECTIVE:  No improvement   VITAL SIGNS: Temp:  [97.5 F (36.4 C)-99.2 F (37.3 C)] 98.5 F (36.9 C) (02/17 1300) Pulse Rate:  [114-123] 117 (02/17 1516) Resp:  [19-31] 31 (02/17 1516) BP: (124-150)/(68-88) 149/88 mmHg (02/17 1516) SpO2:  [84 %-91 %] 88 % (02/17 1516) FiO2 (%):  [100 %] 100 % (02/17 1516) Weight:  [79 kg (174 lb 2.6 oz)] 79 kg (174 lb 2.6 oz) (02/17 0007) HEMODYNAMICS:   VENTILATOR SETTINGS: Vent Mode:  [-]  FiO2 (%):  [100 %] 100 % INTAKE / OUTPUT: Intake/Output     02/16 0701 - 02/17 0700 02/17 0701 - 02/18 0700   I.V. (mL/kg) 190 (2.4) 20 (0.3)   NG/GT     IV Piggyback  50   Total Intake(mL/kg) 190 (2.4) 70 (0.9)   Urine (mL/kg/hr) 400 (0.2) 600 (0.7)   Total Output 400 600   Net -210 -530        Urine Occurrence 200 x      PHYSICAL EXAMINATION: General:  Chronically ill, BiPAP - appears mildly distressed Neuro:  Awake, has chronic aphasia, sleepy this am HEENT:  cpap mask in place, no significant air leaks Cardiovascular: regular tachy Lungs:  Decreased on left, and crackles on right.  No wheezing Abdomen:  Soft, not tender  LE without significant edema, no cyanosis  LABS:  Recent Labs Lab 02/14/2013 1201 02/13/2013 1204 01/26/2013 1210 02/03/2013 1556 01/21/2013 1620  02/01/13 0512 02/02/13 0500 02/04/13 0733  HGB 9.8*  --   --   --   --   < > 9.6* 9.6* 9.6*  WBC 14.1*  --   --   --   --   < >  13.9* 13.7* 12.3*  PLT 367  --   --   --   --   < > 334 387 348  NA 128*  --   --   --   --   --  127* 133* 135  K 5.2*  --   --   --   --   --  4.7 4.6 4.2  CL 88*  --   --   --   --   --  89* 94* 92*  CO2 33*  --   --   --   --   --  32 27 29  GLUCOSE 132*  --   --   --   --   --  114* 141* 180*  BUN 26*  --   --   --   --   --  23 19 27*  CREATININE 0.92  --   --  0.87  --   --  0.85 0.71 0.87  CALCIUM 9.6  --   --   --   --   --  9.4 9.4 9.6  MG  --   --   --  2.3  --   --  2.1  --   --   PHOS  --   --   --  4.5  --   --  4.1  --    --   AST 17  --   --   --   --   --   --   --   --   ALT 21  --   --   --   --   --   --   --   --   ALKPHOS 109  --   --   --   --   --   --   --   --   BILITOT 0.3  --   --   --   --   --   --   --   --   PROT 7.6  --   --   --   --   --   --   --   --   ALBUMIN 2.9*  --   --   --   --   --   --   --   --   INR 1.14  --   --   --   --   --   --   --   --   PROCALCITON  --   --   --   --  1.07  --   --   --   --   PROBNP  --  1136.0*  --   --   --   --   --   --   --   PHART  --   --  7.397  --   --   --   --   --   --   PCO2ART  --   --  53.0*  --   --   --   --   --   --   PO2ART  --   --  77.0*  --   --   --   --   --   --   < > = values in this interval not displayed.  Recent Labs Lab 02/03/13 1557 02/03/13 1944 02/03/13 2348 02/04/13 0832 02/04/13 1335  GLUCAP 138* 129* 114* 187* 149*  ASSESSMENT / PLAN:  PULMONARY A: acute respiratory failure Think  this is likely due to complete opacification of the left hemithorax, which is due to mucous plugging, lung collapse, effusion and possible infection.  Prognosis is poor.   P:   Continue BiPAP Recheck CXR AM2/18  GASTROINTESTINAL A:   Dysphagia  P:    NPO   HEMATOLOGIC A:   Anemia, no evidence of bleeding  Stage IV lung cancer. CT abd obtained for uncertain reasons on 2/12 suggest recurrent disease.  P:  Trend CBC LMWH    INFECTIOUS A:  Unclear whether HCAP in left lung, but now has infiltrate in right lung with low grade temps.  Suspect this is aspiration given his history P:   F/u  cultures  Doubt clinda adding much - D/C   ENDOCRINE A:  DM w/ hyperglycemia  P:   Pt had episode of hypoglycemia overnight, and will therefore d/c ssi and only cover if CBG greater than 200.  NEUROLOGIC A:  H/o left MCA CVA P:   supportive care    If not much better in the next 24 hrs, need to consider transitioning to full palliative care  [name], MD ; [facility] service Mobile [phone].  After 5:30  PM or weekends, call [phone]

## 2013-02-04 NOTE — Progress Notes (Signed)
CPT put on hold because of pt desaturation.

## 2013-02-04 NOTE — Progress Notes (Signed)
Clinical Child psychotherapist received return call from [place] at [facility].  They have been attempting to get in touch with family as well.  SNF provided number to pt's son, Aurelio Brash [phone].  CSW left voicemail message with Sharma Covert requesting he follow up with this CSW and/or SNF.     Angelia Mould, MSW, [place] [phone]

## 2013-02-04 NOTE — Clinical Social Work Psychosocial (Signed)
     Clinical Social Work Department BRIEF PSYCHOSOCIAL ASSESSMENT 02/04/2013  Patient:  Johnathan Bowman, Johnathan Bowman     Account Number:  [id number]     Admit date:  02/09/2013  Clinical Social Worker:  Margaree Mackintosh  Date/Time:  02/04/2013 03:52 PM  Referred by:  CSW  Date Referred:  02/04/2013 Referred for  SNF Placement   Other Referral:   Interview type:  Other - See comment Other interview type:    PSYCHOSOCIAL DATA Living Status:  FACILITY Admitted from facility:  [facility] Level of care:  Skilled Nursing Facility Primary support name:  Kacee Koren: [phone] Primary support relationship to patient:  SPOUSE Degree of support available:   Unknown.    CURRENT CONCERNS Current Concerns  Post-Acute Placement   Other Concerns:    SOCIAL WORK ASSESSMENT / PLAN Clinical Social Worker reviewed chart and noted pt is from [facility]. CSW attempted to meet with pt; pt currently appears to be resting.  CSW attempted to contact pt's wife at numbers listed in the chart-these numbers were to disconnected lines.  CSW left voicemail message with pt's dtr.  CSW introduced self and explianed role.  CSW requested a return call to confirm pt is from facility and to ensure [facility] has correct numbers listed for pt's wife. CSW left message with [place] at [facility].  CSW to continue to follow and assist as needed.   Assessment/plan status:  Information/Referral to [facility] Other assessment/ plan:   Information/referral to community resources:   SNF.    PATIENTS/FAMILYS RESPONSE TO PLAN OF CARE: Pt, family, and SNF were unavailable.

## 2013-02-05 ENCOUNTER — Inpatient Hospital Stay (HOSPITAL_COMMUNITY): Payer: Medicare Other

## 2013-02-05 DIAGNOSIS — E119 Type 2 diabetes mellitus without complications: Secondary | ICD-10-CM

## 2013-02-05 LAB — BASIC METABOLIC PANEL
Calcium: 9.4 mg/dL (ref 8.4–10.5)
GFR calc Af Amer: >90 mL/min (ref >90)
GFR calc non Af Amer: 83 mL/min — ABNORMAL LOW (ref >90)
Glucose, Bld: 151 mg/dL — ABNORMAL HIGH (ref 70–99)
Potassium: 4.5 mEq/L (ref 3.5–5.1)
Sodium: 138 mEq/L (ref 135–145)

## 2013-02-05 LAB — VANCOMYCIN, TROUGH: Vancomycin Tr: 21 ug/mL — ABNORMAL HIGH (ref 10.0–20.0)

## 2013-02-05 LAB — CBC
Hemoglobin: 9.4 g/dL — ABNORMAL LOW (ref 13.0–17.0)
Platelets: 347 10*3/uL (ref 150–400)
RBC: 3.6 MIL/uL — ABNORMAL LOW (ref 4.22–5.81)
WBC: 12 10*3/uL — ABNORMAL HIGH (ref 4.0–10.5)

## 2013-02-05 LAB — GLUCOSE, CAPILLARY
Glucose-Capillary: 160 mg/dL — ABNORMAL HIGH (ref 70–99)
Glucose-Capillary: 151 mg/dL — ABNORMAL HIGH (ref 70–99)
Glucose-Capillary: 150 mg/dL — ABNORMAL HIGH (ref 70–99)

## 2013-02-05 NOTE — Progress Notes (Signed)
RT is questioning CPT and if it is still necessary. There has been no documentation of NTS, secretion being suctioned or coughed up by pt since 02/01/13. Patient has remained diminished but clear. CPT seems to cause slight anxiety with the pt and Sats drop during treatment. If seal of BiPap is broken to suction patient his Sats drop into 70-80's. Pt has also completed round of Mucomyst with no increased secretions noted. RT will pass along to dayshift and RN to question physician if this is still necessary.

## 2013-02-05 NOTE — Progress Notes (Signed)
Patient ID: Johnathan Bowman, male   DOB: [birth year], 79 y.o.   MRN: [id number]   [place] was performed at bedside. Very small amt fluid seen in Left pleural space No thoracentesis performed  Dr Sung Amabile aware

## 2013-02-05 NOTE — Progress Notes (Signed)
Discontinue CPT vest per MD Simmonds.

## 2013-02-05 NOTE — Progress Notes (Signed)
Clinical Social Worker attempted to phone numbers listed in chart for pt's wife; both numbers listed as disconnected.  CSW spoke with Johnathan Bowman.  CSW introduced self, explained role, and provided support.  CSW provided active listening.  Sharma Covert stated, "It's tough, but as long as he's trying, we're going to try".  CSW validated feelings.  [name] was in vicinity of [place] during conversation. Wife thanked CSW for intervention.  CSW to continue to follow and assist as needed.   Angelia Mould, MSW, [place] [phone]

## 2013-02-05 NOTE — Progress Notes (Signed)
PULMONARY  / CRITICAL CARE MEDICINE  Name: Johnathan Bowman MRN: [id number] DOB: Mar 08, 1929    ADMISSION DATE:  01/29/2013 CONSULTATION DATE:  2/13  REFERRING MD :  A. Freida Busman; PCP [facility]    CHIEF COMPLAINT:  Hypoxic resp failure   BRIEF PATIENT DESCRIPTION:  79 year old HM w/ PMH stage IV lung cancer, s/p pleur-x (since removed), and 6 cycles of chemo rx w/ plans to re-stage in January 2014. In the mean time he had a left MCA and PCA CVA with complications which has included dysphagia and aspiration. He was discharged to SNF. Presents to ER 2/13 w/ sudden onset SOB w/cough. EMS was called. He was given NTG and placed on CPAP. On evaluation he was found to have complete opacification of the left hemi-thorax, mild leukocytosis and hyponatremia. PCCM asked to admit.   SIGNIFICANT EVENTS / STUDIES:  CT abd/pelvis 2/12: Increasing left pleural effusion. Collapse of the lingula and left lower lobe. Low attenuation nodules in the collapsed lingula likely represent metastatic nodules. Increasing effusion and lymph nodes are highly suspicious for recurrent disease. Enlarging prevascular lymph node is partially visualized, also consistent with recurrent disease.  2. Small nodular foci in the dependent right lower lobe suspicious for pneumonia or aspiration. Metastatic nodules considered less likely.  3. Uncomplicated appearance of the percutaneous gastrostomy tube. There is no abscess or evidence of soft tissue infection around the entry site. The tube as well situated in the stomach. 4. Unchanged hepatic and renal cysts. No evidence of metastatic disease to the abdomen. 5. Interval development of T8 compression fracture with 30% loss  of anterior vertebral body height. No paravertebral phlegmon to suggest acute or subacute injury.  2/18 [place] chest - minimal fluid  LINES / TUBES:  CULTURES: BCX 2 2/13>> NEG UC 2/13>> NEG Urine strep 2/13>> NEG Urine legionella 2/13>> NEG  ANTIBIOTICS: Cefepime  2/13>>> vanc 2/13>> 2/18 clinda 2/15>> 2/17   SUBJECTIVE:  No improvement   VITAL SIGNS: Temp:  [97.8 F (36.6 C)-99.9 F (37.7 C)] 99.3 F (37.4 C) (02/18 1600) Pulse Rate:  [42-128] 128 (02/18 1600) Resp:  [20-32] 27 (02/18 1600) BP: (120-146)/(66-82) 138/79 mmHg (02/18 1600) SpO2:  [86 %-100 %] 86 % (02/18 1600) FiO2 (%):  [100 %] 100 % (02/18 1600) Weight:  [75 kg (165 lb 5.5 oz)] 75 kg (165 lb 5.5 oz) (02/18 0400) HEMODYNAMICS:   VENTILATOR SETTINGS: Vent Mode:  [-]  FiO2 (%):  [100 %] 100 % INTAKE / OUTPUT: Intake/Output     02/17 0701 - 02/18 0700 02/18 0701 - 02/19 0700   I.V. (mL/kg) 220 (2.9) 30 (0.4)   IV Piggyback 550    Total Intake(mL/kg) 770 (10.3) 30 (0.4)   Urine (mL/kg/hr) 1100 (0.6) 500 (0.7)   Total Output 1100 500   Net -330 -470          PHYSICAL EXAMINATION: General:  Chronically ill, BiPAP - appears mildly distressed Neuro:  Awake, has chronic aphasia, sleepy this am HEENT:  cpap mask in place, no significant air leaks Cardiovascular: regular tachy Lungs:  Decreased on left, and crackles on right.  No wheezing Abdomen:  Soft, not tender  LE without significant edema, no cyanosis  LABS:  Recent Labs Lab 02/14/2013 1201 02/10/2013 1204 01/21/2013 1210 01/29/2013 1556 02/13/2013 1620  02/01/13 0512 02/02/13 0500 02/04/13 0733 02/05/13 0451  HGB 9.8*  --   --   --   --   < > 9.6* 9.6* 9.6* 9.4*  WBC 14.1*  --   --   --   --   < >  13.9* 13.7* 12.3* 12.0*  PLT 367  --   --   --   --   < > 334 387 348 347  NA 128*  --   --   --   --   --  127* 133* 135 138  K 5.2*  --   --   --   --   --  4.7 4.6 4.2 4.5  CL 88*  --   --   --   --   --  89* 94* 92* 97  CO2 33*  --   --   --   --   --  32 27 29 30   GLUCOSE 132*  --   --   --   --   --  114* 141* 180* 151*  BUN 26*  --   --   --   --   --  23 19 27* 24*  CREATININE 0.92  --   --  0.87  --   --  0.85 0.71 0.87 0.77  CALCIUM 9.6  --   --   --   --   --  9.4 9.4 9.6 9.4  MG  --   --   --  2.3   --   --  2.1  --   --   --   PHOS  --   --   --  4.5  --   --  4.1  --   --   --   AST 17  --   --   --   --   --   --   --   --   --   ALT 21  --   --   --   --   --   --   --   --   --   ALKPHOS 109  --   --   --   --   --   --   --   --   --   BILITOT 0.3  --   --   --   --   --   --   --   --   --   PROT 7.6  --   --   --   --   --   --   --   --   --   ALBUMIN 2.9*  --   --   --   --   --   --   --   --   --   INR 1.14  --   --   --   --   --   --   --   --   --   PROCALCITON  --   --   --   --  1.07  --   --   --   --   --   PROBNP  --  1136.0*  --   --   --   --   --   --   --   --   PHART  --   --  7.397  --   --   --   --   --   --   --   PCO2ART  --   --  53.0*  --   --   --   --   --   --   --   PO2ART  --   --  77.0*  --   --   --   --   --   --   --   < > =  values in this interval not displayed.  Recent Labs Lab 02/04/13 0832 02/04/13 1335 02/04/13 2303 02/05/13 0650 02/05/13 1246  GLUCAP 187* 149* 160* 151* 165*      ASSESSMENT / PLAN:  PULMONARY A: acute respiratory failure "white out" of L hemithorax  Minimal fluid by [place]. Therefore this is mostly collapse.   P:   Continue BiPAP Will discuss with family and pt if possible about transitioning to full comfort  GASTROINTESTINAL A:   Dysphagia  P:    NPO   HEMATOLOGIC A:   Anemia, no evidence of bleeding  Stage IV lung cancer. CT abd obtained for uncertain reasons on 2/12 suggest recurrent disease.  P:  Trend CBC LMWH    INFECTIOUS A:  Unclear whether HCAP in left lung, but now has infiltrate in right lung with low grade temps.  Suspect this is aspiration given his history. All cx negative P:   D/C Vanc  Cont Zosyn  ENDOCRINE A:  DM w/ hyperglycemia  P:   Pt had episode of hypoglycemia overnight, and will therefore d/c ssi and only cover if CBG greater than 200.  NEUROLOGIC A:  H/o left MCA CVA P:   supportive care    If not much better in the next 24 hrs, need to consider  transitioning to full palliative care  [name], MD ; [facility] service Mobile [phone].  After 5:30 PM or weekends, call [phone]

## 2013-02-06 DIAGNOSIS — J9819 Other pulmonary collapse: Secondary | ICD-10-CM

## 2013-02-06 LAB — GLUCOSE, CAPILLARY: Glucose-Capillary: 156 mg/dL — ABNORMAL HIGH (ref 70–99)

## 2013-02-06 MED ORDER — ALBUTEROL SULFATE (5 MG/ML) 0.5% IN NEBU
2.5000 mg | INHALATION_SOLUTION | Freq: Four times a day (QID) | RESPIRATORY_TRACT | Status: DC
  Administered 2013-02-06: 2.5 mg via RESPIRATORY_TRACT
  Filled 2013-02-06: qty 0.5

## 2013-02-06 MED ORDER — MORPHINE SULFATE 25 MG/ML IV SOLN
5.0000 mg/h | INTRAVENOUS | Status: DC
  Administered 2013-02-06: 5 mg/h via INTRAVENOUS
  Filled 2013-02-06: qty 10

## 2013-02-07 LAB — CULTURE, BLOOD (ROUTINE X 2): Culture: NO GROWTH

## 2013-02-07 NOTE — Progress Notes (Signed)
Chaplain responded to spiritual consult at time of admission requesting chaplain visit. (I had attempted a visit another time when pt was having a treatment and Chaplain Holder had come once when pt was asleep.) This time pt was awake and alert. Of course communication was limited by his breathing mask but he communicated "yes" by a thumbs up with his right hand. I shared some words of understanding and comfort and, with his permission, read Psalm 23. Also with his permission I prayed for him - for God's presence and peace during his final days.

## 2013-02-13 ENCOUNTER — Inpatient Hospital Stay: Payer: Medicare Other | Admitting: Physical Medicine & Rehabilitation

## 2013-02-16 NOTE — Progress Notes (Signed)
Dr. Sung Amabile asked if family has been here this morning.  No family was here this morning but he was informed that they were here yesterday.  MD called wife.  Wife came with another family member.  MD ordered to start Morphine drip and switched to NRB from BiPap.  Drip started once I received the medicine from the pharmacy.  Patient's nephew, Mr. Aurelio Brash came.  Mr. Madilyn Fireman and I stayed at the bedside until patient took his last breath.    1425. Patient has no pulse, bilateral pupils are fixed and no respirations.  Skin is cold and clammy.  Cardiac monitor is showing asystole.  MD is made aware.  AC and bed control is made aware of the deceased.  Family will call for funeral home arrangements

## 2013-02-16 NOTE — Progress Notes (Signed)
Morphine drip 180 ml wasted in sink.  Witnessed by Roselie Awkward, RN.

## 2013-02-16 NOTE — Progress Notes (Signed)
ANTIBIOTIC CONSULT NOTE - FOLLOW UP  Pharmacy Consult for Cefepime Indication: Suspected HCAP  Allergies  Allergen Reactions  . Penicillins Other (See Comments)    Unknown childhood allergy    Patient Measurements: Height: 5' 4.96" (165 cm) Weight: 172 lb 13.5 oz (78.4 kg) IBW/kg (Calculated) : 61.41  Vital Signs: Temp: 99.1 F (37.3 C) (02/19 0415) Temp src: Axillary (02/19 0415) BP: 123/68 mmHg (02/19 0415) Pulse Rate: 127 (02/19 0415) Intake/Output from previous day: 02/18 0701 - 02/19 0700 In: 230 [I.V.:130; IV Piggyback:100] Out: 1100 [Urine:1100] Intake/Output from this shift:    Labs:  Recent Labs  02/04/13 0733 02/05/13 0451  WBC 12.3* 12.0*  HGB 9.6* 9.4*  PLT 348 347  CREATININE 0.87 0.77   Estimated Creatinine Clearance: 69.9 ml/min (by C-G formula based on Cr of 0.77).  Recent Labs  02/05/13 1507  VANCOTROUGH 21.0*     Assessment: Johnathan Bowman continues on Cefepime (d#7) for suspected HCAP vs. Aspiration PNA. Patient is now afebrile, WBC elevated at 12. Scr has been stable, UOP is good. Noted Vancomycin was stopped last PM.  2/13 Vanc>>2/18 2/13 Cefepime>> 2/15 Clinda>>2/17  2/13 Urine>> Negative 2/13 Blood>> NGTD  Goal of Therapy:  Appropriate Cefepime dosing  Plan:  - Continue Cefepime 2gm IV q 12h - Please address intended duration of therapy- consider stop date  - Will monitor cx/spec/sens, renal fn and clinical status daily.  Thanks, Destinie Thornsberry K. Allena Katz, PharmD, BCPS.  Clinical Pharmacist Pager [phone]. 01/24/2013 7:54 AM

## 2013-02-16 NOTE — Discharge Summary (Signed)
DEATH SUMMARY  DATE OF ADMISSION:  02/01/2023  DATE OF DISCHARGE/DEATH:  02-07-23  ADMISSION DIAGNOSES:   Acute respiratory failure Left lung collapse Possible HCAP Small left pleural effusion SIRS Hyponatremia Hyperkalemia Sinus tachycardia Dysphagia Anemia Stage IV lung cancer Type 2 DM H/O L MCA CVA    DISCHARGE DIAGNOSES:   Acute respiratory failure Left lung collapse Possible HCAP Small left pleural effusion SIRS Hyponatremia Hyperkalemia Sinus tachycardia Dysphagia Anemia Stage IV lung cancer Type 2 DM H/O L MCA CVA   PRESENTATION:  He was admitted by Dr Delton Coombes with the following history and the above admission diagnoses:  79 year old HM w/ PMH stage IV lung cancer, s/p pleur-x (since removed), and 6 cycles of chemo rx w/ plans to re-stage in January 2014. In the mean time he had a left MCA and PCA CVA with complications which has included dysphagia and aspiration. He was discharged to SNF. Presents to ER 01-Feb-2023 w/ sudden onset SOB w/cough. EMS was called. He was given NTG and placed on CPAP. On evaluation he was found to have complete opacification of the left hemi-thorax, mild leukocytosis and hyponatremia. PCCM asked to admit   HOSPITAL COURSE:   He was supported with NPPV and treated with broad spectrum abx. He expressed a desire to not be intubated. He never improved such that he was able to come off the BiPAP and his CXR showed persistent L lung collapse. On 2023/02/07, he expressed a desire to be made comfortable and have the BiPAP removed. This was done according to his wishes and he passed away peacefully shortly thereafter. No autopsy was performed  SIGNIFICANT EVENTS / STUDIES:  CT abd/pelvis 2/12: Increasing left pleural effusion. Collapse of the lingula and left lower lobe. Low attenuation nodules in the collapsed lingula likely represent metastatic nodules. Increasing effusion and lymph nodes are highly suspicious for recurrent disease. Enlarging prevascular lymph  node is partially visualized, also consistent with recurrent disease.  2. Small nodular foci in the dependent right lower lobe suspicious for pneumonia or aspiration. Metastatic nodules considered less likely.  3. Uncomplicated appearance of the percutaneous gastrostomy tube. There is no abscess or evidence of soft tissue infection around the entry site. The tube as well situated in the stomach. 4. Unchanged hepatic and renal cysts. No evidence of metastatic disease to the abdomen. 5. Interval development of T8 compression fracture with 30% loss  of anterior vertebral body height. No paravertebral phlegmon to suggest acute or subacute injury.    Cause of death: metastatic lung cancer Contributing factors: L lung collapse HCAP  Remote smoker (quit 27 yrs PTA)   [name], MD;  PCCM service; Mobile [phone]

## 2013-02-16 NOTE — Progress Notes (Signed)
Pt taken off BiPAP and put on 15 LPM Non-rebreather per MD order. Pt comfort care.

## 2013-02-16 NOTE — Progress Notes (Addendum)
PULMONARY  / CRITICAL CARE MEDICINE  Name: Johnathan Bowman MRN: [id number] DOB: [birth year]    ADMISSION DATE:  01/24/2013 CONSULTATION DATE:  2/13  REFERRING MD :  A. Freida Busman; PCP [facility]    CHIEF COMPLAINT:  Hypoxic resp failure   BRIEF PATIENT DESCRIPTION:  79 year old HM w/ PMH stage IV lung cancer, s/p pleur-x (since removed), and 6 cycles of chemo rx w/ plans to re-stage in January 2014. In the mean time he had a left MCA and PCA CVA with complications which has included dysphagia and aspiration. He was discharged to SNF. Presents to ER 2/13 w/ sudden onset SOB w/cough. EMS was called. He was given NTG and placed on CPAP. On evaluation he was found to have complete opacification of the left hemi-thorax, mild leukocytosis and hyponatremia. PCCM asked to admit.   SIGNIFICANT EVENTS / STUDIES:  CT abd/pelvis 2/12: Increasing left pleural effusion. Collapse of the lingula and left lower lobe. Low attenuation nodules in the collapsed lingula likely represent metastatic nodules. Increasing effusion and lymph nodes are highly suspicious for recurrent disease. Enlarging prevascular lymph node is partially visualized, also consistent with recurrent disease.  2. Small nodular foci in the dependent right lower lobe suspicious for pneumonia or aspiration. Metastatic nodules considered less likely.  3. Uncomplicated appearance of the percutaneous gastrostomy tube. There is no abscess or evidence of soft tissue infection around the entry site. The tube as well situated in the stomach. 4. Unchanged hepatic and renal cysts. No evidence of metastatic disease to the abdomen. 5. Interval development of T8 compression fracture with 30% loss  of anterior vertebral body height. No paravertebral phlegmon to suggest acute or subacute injury.  2/18 [place] chest: Small amount of left pleural fluid. 2/19: No improvement. Pt indicates that he wants BiPAP mask and comfort care. Wife informed and in agreement. Morphine  infusion started LINES / TUBES:  CULTURES: BCX 2 2/13>> NEG UC 2/13>> NEG Urine strep 2/13>> NEG Urine legionella 2/13>> NEG  ANTIBIOTICS: Cefepime 2/13>>> 2/19 vanc 2/13>> 2/18 clinda 2/15>> 2/17   SUBJECTIVE:  No improvement. Pt indicates that he wants BiPAP mask and comfort care. Wife informed and in agreement. Morphine infusion started   VITAL SIGNS: Temp:  [98 F (36.7 C)-99.9 F (37.7 C)] 98.8 F (37.1 C) (02/19 0844) Pulse Rate:  [125-128] 125 (02/19 0853) Resp:  [19-28] 23 (02/19 0844) BP: (121-142)/(68-82) 137/77 mmHg (02/19 0853) SpO2:  [85 %-96 %] 90 % (02/19 0853) FiO2 (%):  [100 %] 100 % (02/19 0853) Weight:  [78.4 kg (172 lb 13.5 oz)] 78.4 kg (172 lb 13.5 oz) (02/18 2323) HEMODYNAMICS:   VENTILATOR SETTINGS: Vent Mode:  [-]  FiO2 (%):  [100 %] 100 % INTAKE / OUTPUT: Intake/Output     02/18 0701 - 02/19 0700 02/19 0701 - 02/20 0700   I.V. (mL/kg) 130 (1.7)    IV Piggyback 100    Total Intake(mL/kg) 230 (2.9)    Urine (mL/kg/hr) 1100 (0.6)    Total Output 1100     Net -870            PHYSICAL EXAMINATION: General:  Chronically ill, BiPAP - appears mildly distressed Neuro:  Awake, has chronic aphasia, sleepy this am HEENT:  cpap mask in place, no significant air leaks Cardiovascular: regular tachy Lungs:  Decreased on left, and crackles on right.  No wheezing Abdomen:  Soft, not tender  LE without significant edema, no cyanosis  LABS:  Recent Labs Lab 02/11/2013 1201 01/22/2013  1204 2013/02/26 1210 02-26-2013 1556 02-26-13 1620  02/01/13 0512 02/02/13 0500 02/04/13 0733 02/05/13 0451  HGB 9.8*  --   --   --   --   < > 9.6* 9.6* 9.6* 9.4*  WBC 14.1*  --   --   --   --   < > 13.9* 13.7* 12.3* 12.0*  PLT 367  --   --   --   --   < > 334 387 348 347  NA 128*  --   --   --   --   --  127* 133* 135 138  K 5.2*  --   --   --   --   --  4.7 4.6 4.2 4.5  CL 88*  --   --   --   --   --  89* 94* 92* 97  CO2 33*  --   --   --   --   --  32 27 29 30    GLUCOSE 132*  --   --   --   --   --  114* 141* 180* 151*  BUN 26*  --   --   --   --   --  23 19 27* 24*  CREATININE 0.92  --   --  0.87  --   --  0.85 0.71 0.87 0.77  CALCIUM 9.6  --   --   --   --   --  9.4 9.4 9.6 9.4  MG  --   --   --  2.3  --   --  2.1  --   --   --   PHOS  --   --   --  4.5  --   --  4.1  --   --   --   AST 17  --   --   --   --   --   --   --   --   --   ALT 21  --   --   --   --   --   --   --   --   --   ALKPHOS 109  --   --   --   --   --   --   --   --   --   BILITOT 0.3  --   --   --   --   --   --   --   --   --   PROT 7.6  --   --   --   --   --   --   --   --   --   ALBUMIN 2.9*  --   --   --   --   --   --   --   --   --   INR 1.14  --   --   --   --   --   --   --   --   --   PROCALCITON  --   --   --   --  1.07  --   --   --   --   --   PROBNP  --  1136.0*  --   --   --   --   --   --   --   --   PHART  --   --  7.397  --   --   --   --   --   --   --  PCO2ART  --   --  53.0*  --   --   --   --   --   --   --   PO2ART  --   --  77.0*  --   --   --   --   --   --   --   < > = values in this interval not displayed.  Recent Labs Lab 02/05/13 0650 02/05/13 1246 02/05/13 1604 02/15/2013 0019 02/15/2013 0533  GLUCAP 151* 165* 150* 156* 164*      ASSESSMENT / PLAN:  PULMONARY A: acute respiratory failure "white out" of L hemithorax due to collapse  Minimal fluid by [place]. Therefore this is mostly collapse.   P:   D/C BiPAP Begin morphine gtt  GASTROINTESTINAL A:   Dysphagia  P:    NPO   HEMATOLOGIC A:   Anemia, no evidence of bleeding  Stage IV lung cancer. CT abd obtained for uncertain reasons on 2/12 suggest recurrent disease.  P:  No further labs   INFECTIOUS A:  Unclear whether HCAP in left lung, but now has infiltrate in right lung with low grade temps.  Suspect this is aspiration given his history. All cx negative P:   D/C cefepime  ENDOCRINE A:  DM w/ hyperglycemia  P:   D/C CBGs and SSI.  NEUROLOGIC A:  H/o left MCA  CVA P:   supportive care    Will try to meet with wife face-to-face when she arrives. She stated that we should "do what we need to do"  [name], MD ; [facility] [phone].  After 5:30 PM or weekends, call [phone]

## 2013-02-16 DEATH — deceased

## 2013-11-26 IMAGING — CR DG ABD PORTABLE 1V
1 series · 1 of 1 positions shown · non-contrast
Comparison: 12/18/2012

CLINICAL DATA: NG tube placement

PORTABLE ABDOMEN - 1 VIEW

[AP]
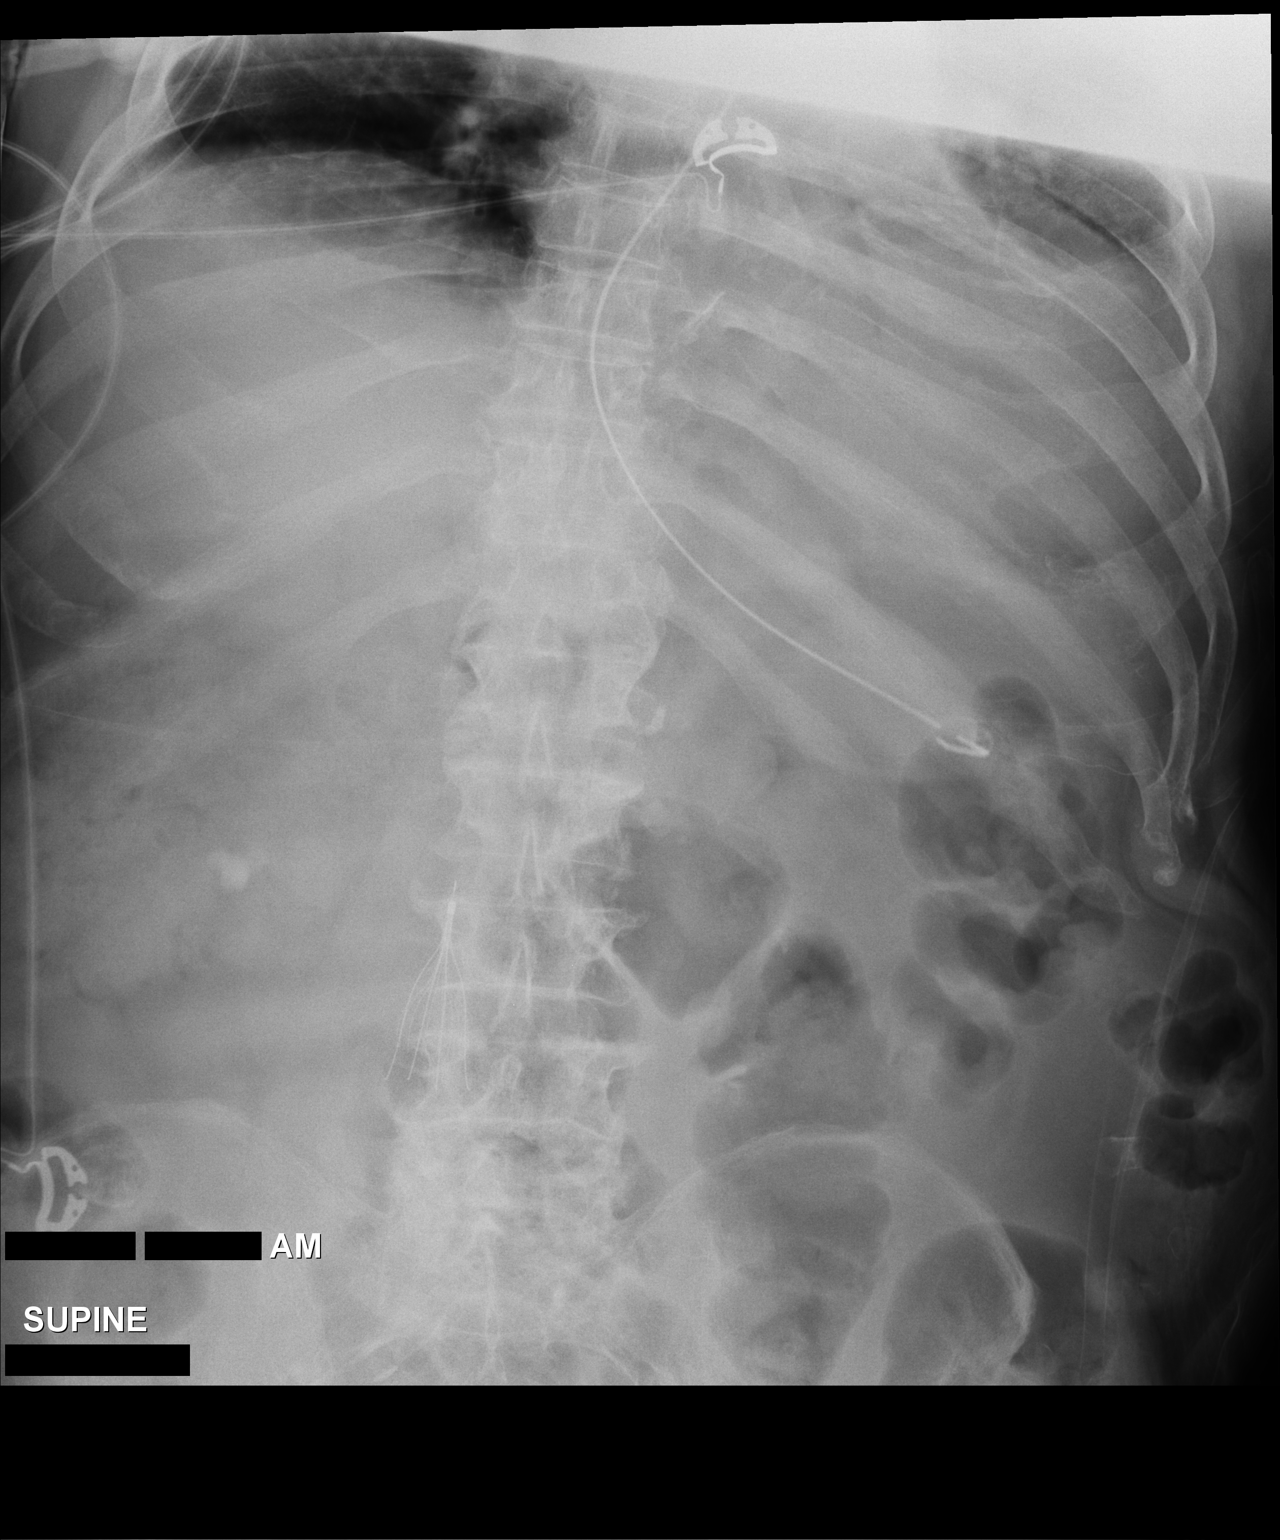

[1 of 1 positions shown; findings below may reference images not displayed]

FINDINGS: 7896 hours.  Bowel gas pattern is nonspecific.  NG tube
tip overlies the mid stomach.  Right renal stone again noted.  IVC
filter is visualized in situ and peri
IMPRESSION: NG tube tip overlies the mid stomach.

## 2013-11-30 IMAGING — CR DG CHEST 1V PORT
1 series · 1 of 1 positions shown · non-contrast
Comparison: 12/24/2012; 12/23/2012; 12/22/2012

CLINICAL DATA: Evaluate endotracheal tube and lines

PORTABLE CHEST - 1 VIEW

[AP]
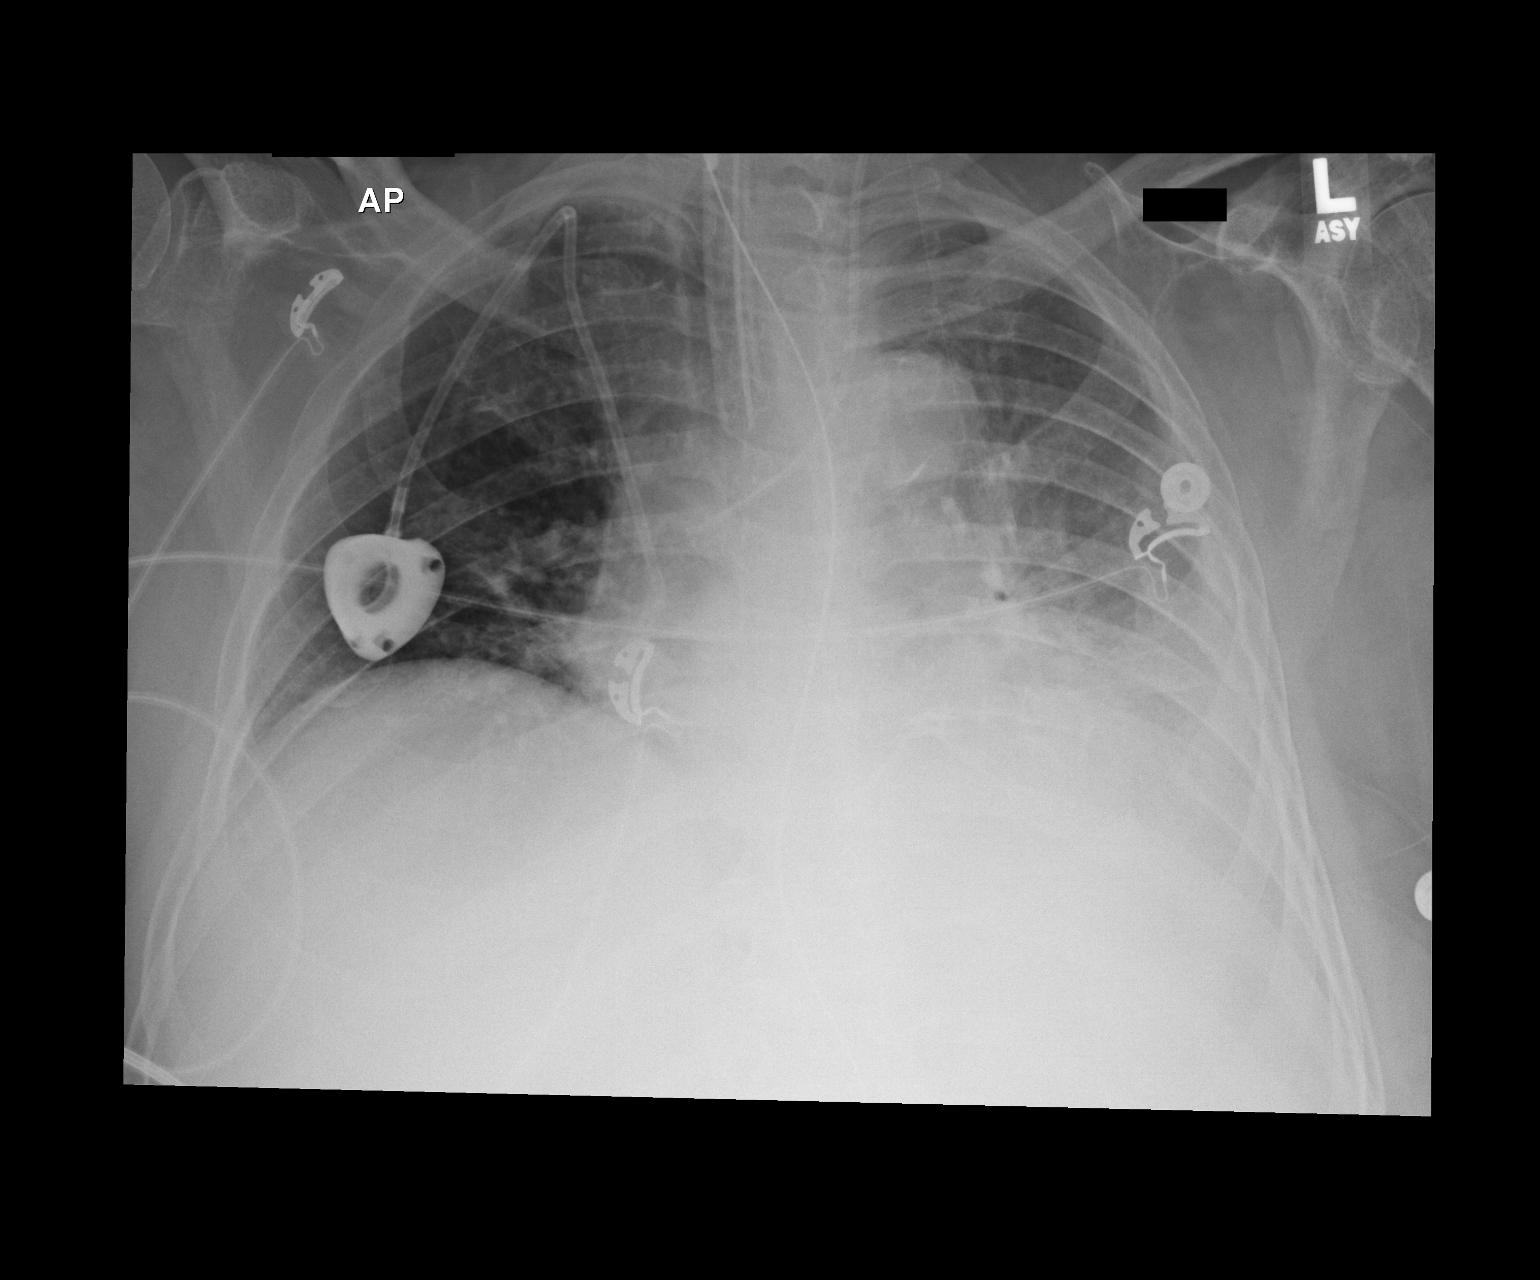

[1 of 1 positions shown; findings below may reference images not displayed]

FINDINGS: Grossly unchanged enlarged cardiac silhouette and
mediastinal contours with atherosclerotic calcifications within the
aortic arch. Lung volumes remain persistently reduced.  Stable
positioning of support apparatus.  No pneumothorax. Grossly
unchanged trace left-sided pleural effusion and left basilar
heterogeneous opacities.  Right infrahilar heterogeneous opacities
are also unchanged.  No new focal airspace opacities.  Unchanged
bones.
IMPRESSION: 1.  Stable positioning of support apparatus.  No pneumothorax.
2.  Unchanged small left-sided effusion and left basilar airspace
opacities worrisome for infection.

## 2013-12-01 IMAGING — CR DG CHEST 1V PORT
1 series · 1 of 1 positions shown · non-contrast
Comparison: 12/25/2012.

CLINICAL DATA: Evaluate lines and endotracheal tube.

PORTABLE CHEST - 1 VIEW

[AP]
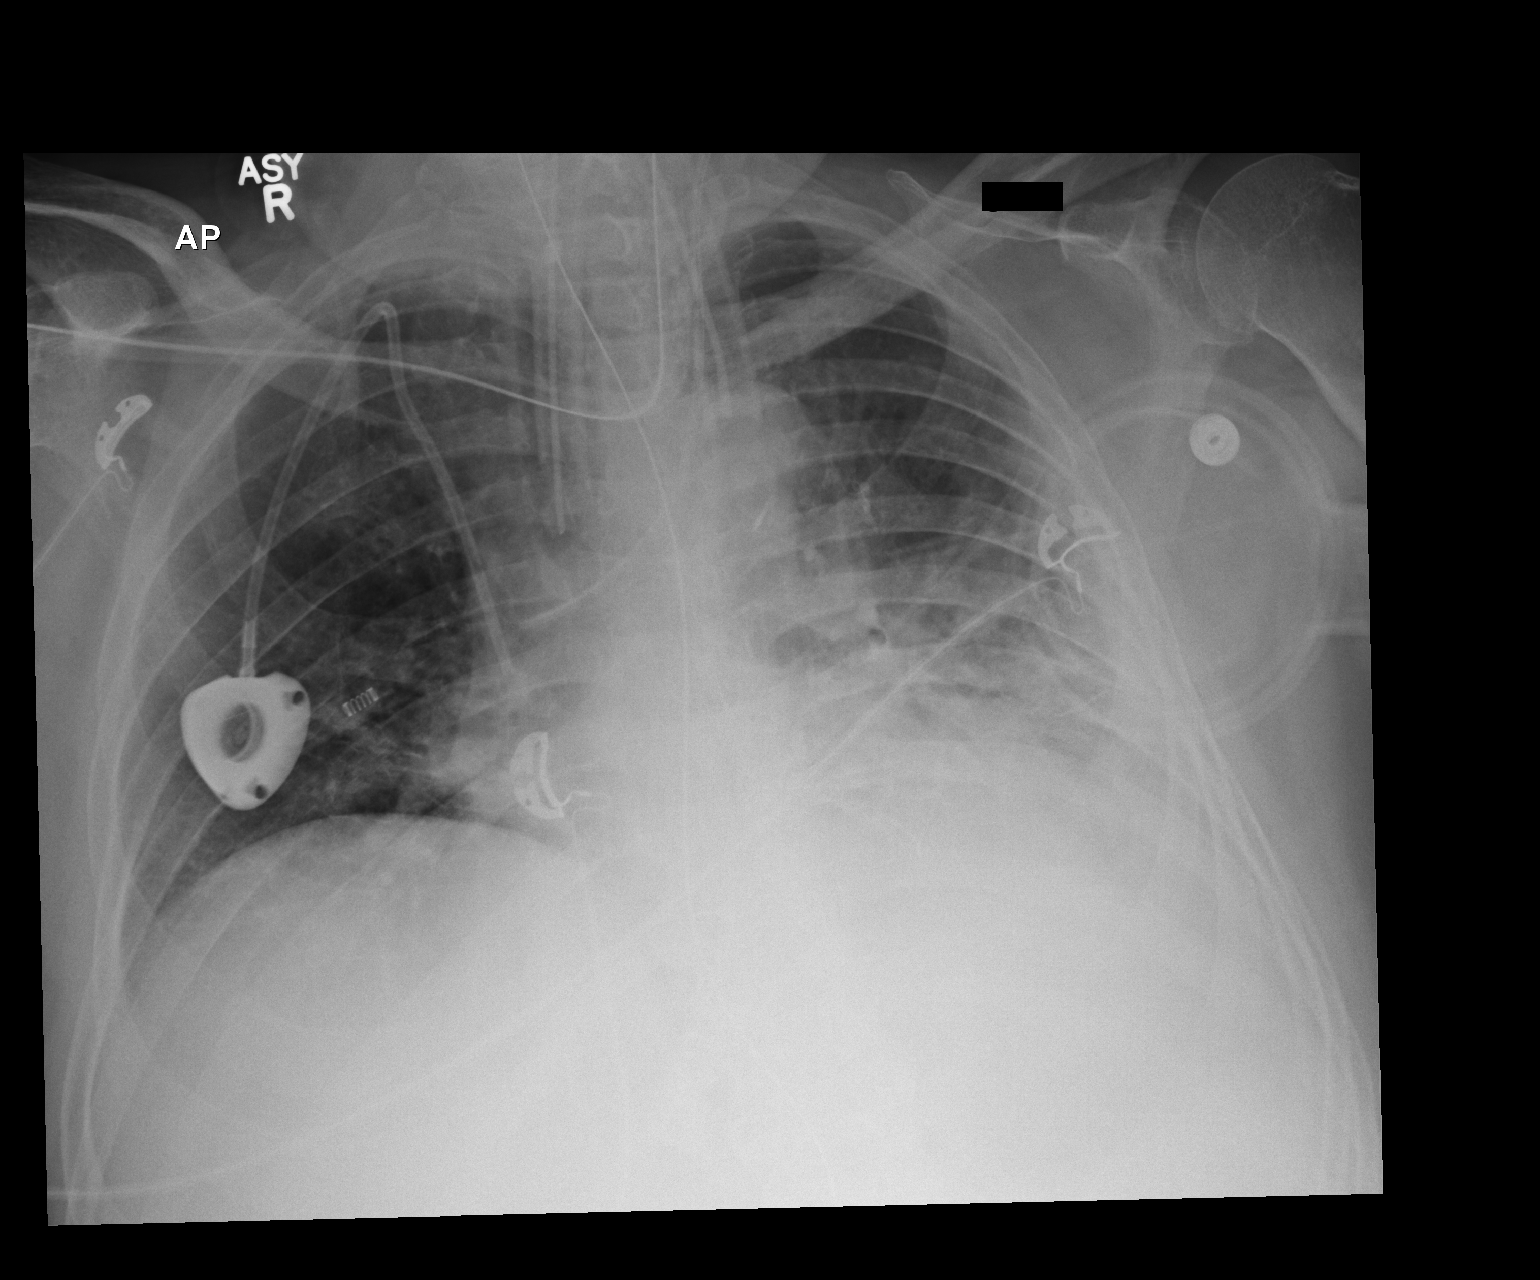

[1 of 1 positions shown; findings below may reference images not displayed]

FINDINGS: Endotracheal tube terminates approximately 3 cm above the
carina.  Nasogastric tube is followed into the stomach.  Left IJ
central line tip projects over the SVC, as does a right IJ Port-A-
Cath tip.

Heart size is grossly stable.  Lungs are very low in volume with
left basilar airspace consolidation, stable.  Minimal right basilar
airspace disease.  Probable left pleural effusion.
IMPRESSION: 1.  Persistent left basilar airspace consolidation and left pleural
effusion, possibly due to pneumonia.  Follow-up to clearing is
recommended.
2.  Minimal right basilar atelectasis.

## 2013-12-02 IMAGING — CR DG CHEST 1V PORT
1 series · 1 of 1 positions shown · non-contrast
Comparison: 12/26/2012

CLINICAL DATA: Respiratory failure and history of lung carcinoma.

PORTABLE CHEST - 1 VIEW

[AP]
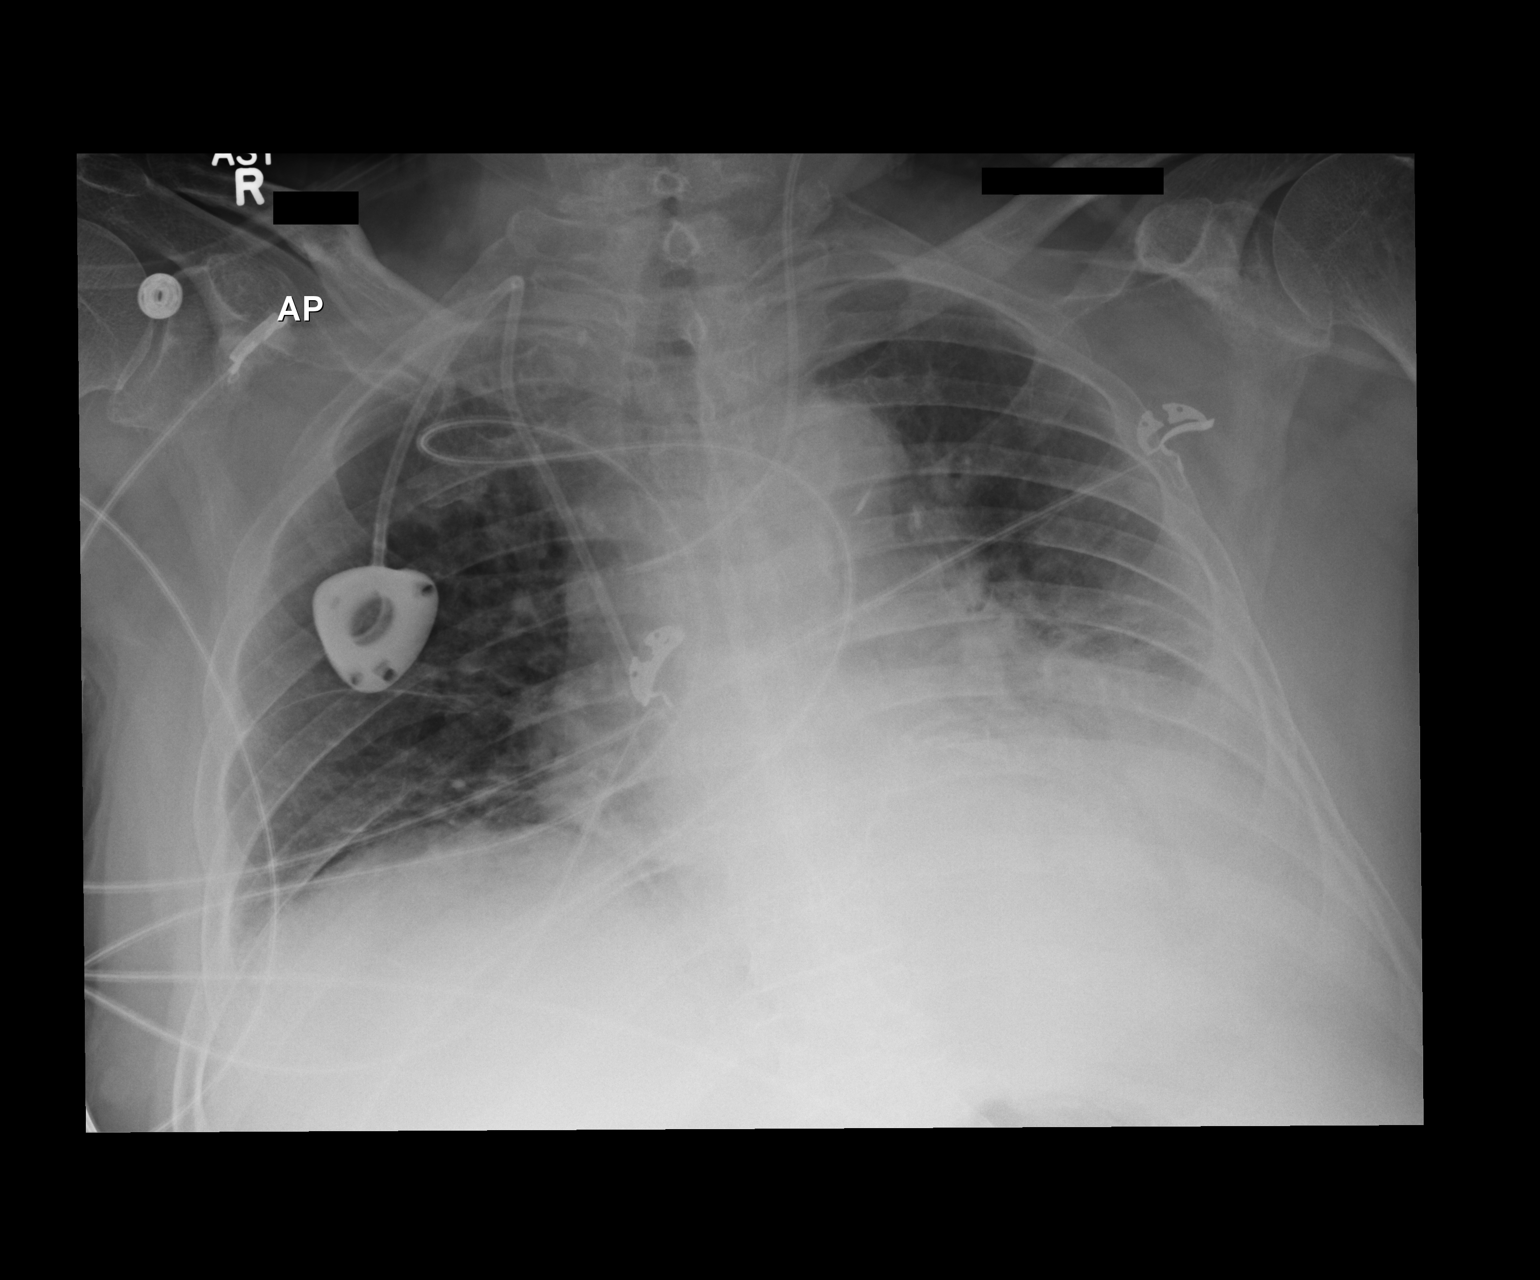

[1 of 1 positions shown; findings below may reference images not displayed]

FINDINGS: The patient has been extubated.  Port-A-Cath and left-
sided central line positioning are stable.  Lung volumes are
slightly lower with bibasilar atelectasis remaining.  There is a
potential additional component of lower lung infiltrate and pleural
effusion on the left.  No overt pulmonary edema identified.
IMPRESSION: Lower lung volumes with bibasilar atelectasis present.  Additional
infiltrate and pleural effusion may be present on the left.

## 2014-01-06 IMAGING — CR DG CHEST 1V PORT
1 series · 1 of 1 positions shown · non-contrast
Comparison: 12/27/2012.

CLINICAL DATA: Shortness of breath, history of lung cancer.

PORTABLE CHEST - 1 VIEW

[AP]
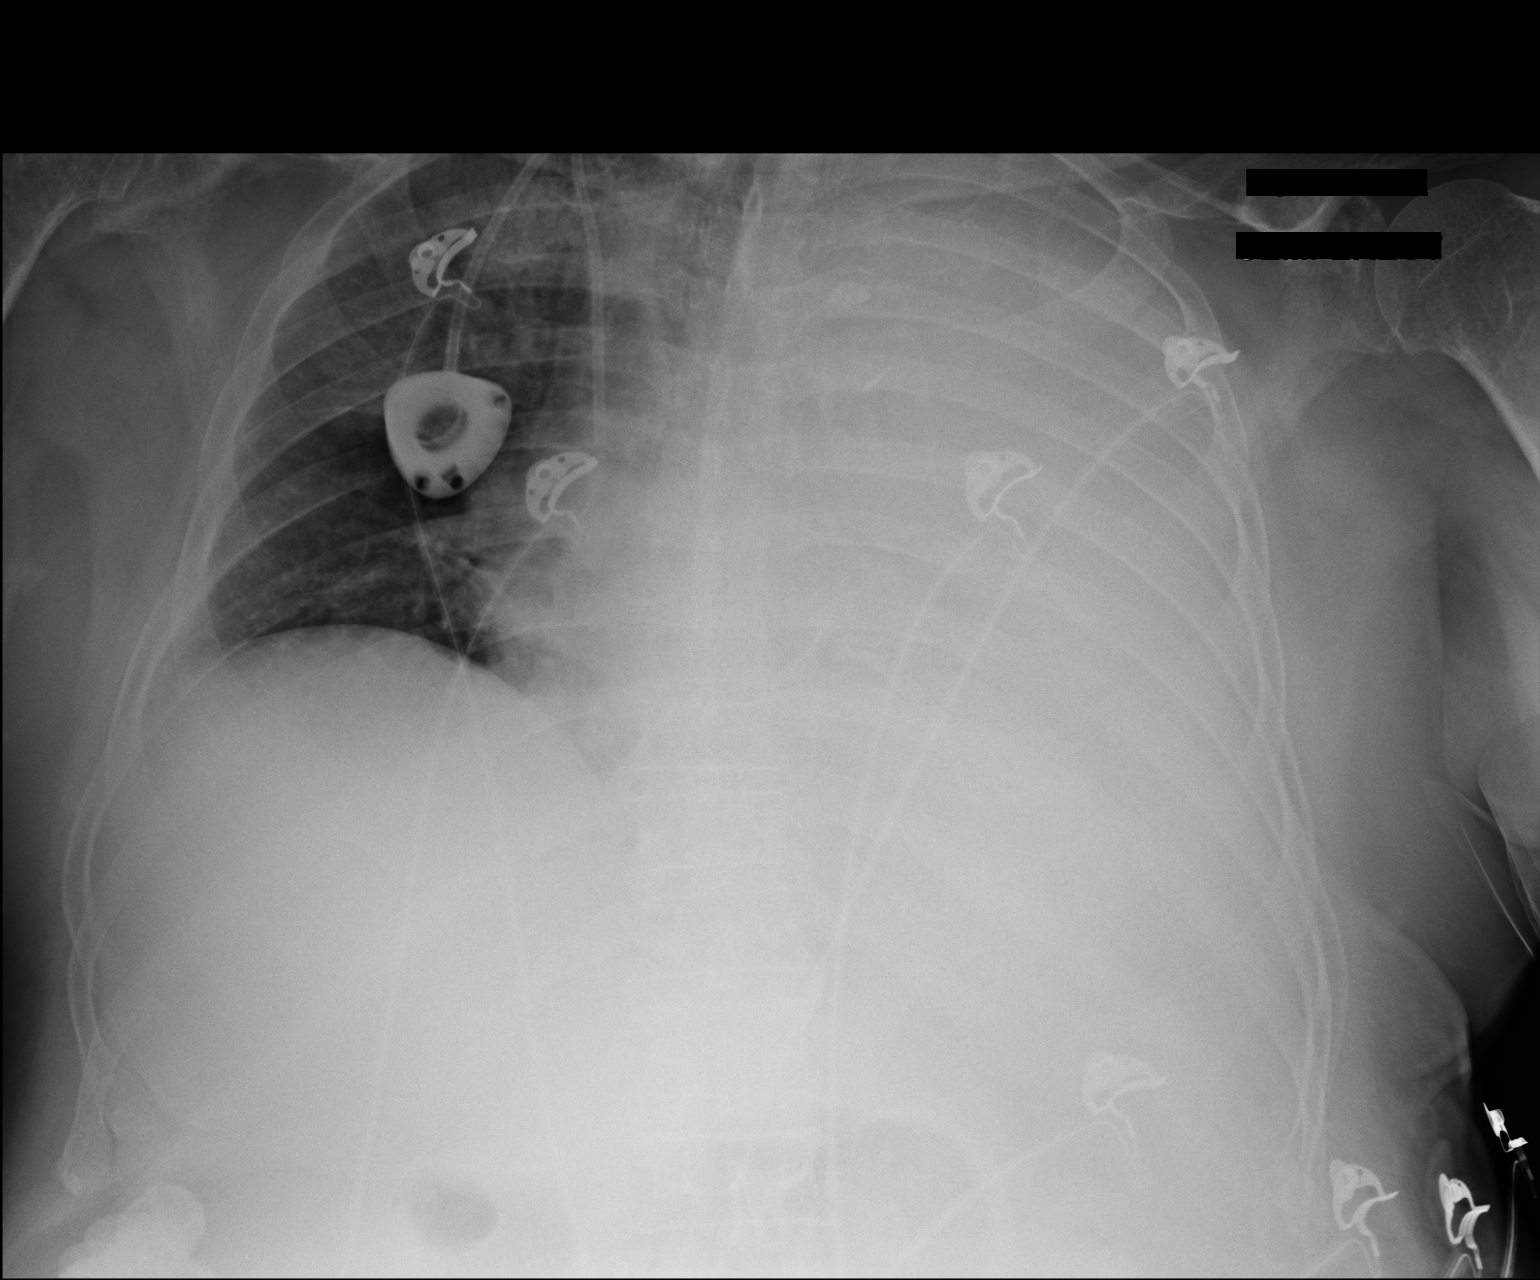

[1 of 1 positions shown; findings below may reference images not displayed]

FINDINGS: Right IJ Port-A-Cath tip projects over the SVC.  Left IJ
central line has been removed.  There is complete opacification of
the left hemithorax, new from 12/27/2012.  Right lung is low in
volume.  Added density in the upper right hemithorax may be due to
the first costochondral junction.
IMPRESSION: Complete opacification of the left hemithorax, new from 12/27/2012,
likely due in large part to pleural fluid.

## 2014-08-12 ENCOUNTER — Encounter: Payer: Self-pay | Admitting: *Deleted

## 2014-08-12 ENCOUNTER — Other Ambulatory Visit: Payer: Self-pay | Admitting: *Deleted

## 2014-08-14 NOTE — Telephone Encounter (Signed)
Encounter was telephone call.
